# Patient Record
Sex: Female | Born: 1990 | Race: Black or African American | Hispanic: No | Marital: Single | Smoking: Never smoker
Health system: Southern US, Community
[De-identification: ages and names within clinical notes are randomized; demographics above are authoritative.]

## PROBLEM LIST (undated history)

## (undated) ENCOUNTER — Inpatient Hospital Stay (HOSPITAL_COMMUNITY): Payer: Self-pay

## (undated) DIAGNOSIS — N739 Female pelvic inflammatory disease, unspecified: Secondary | ICD-10-CM

## (undated) DIAGNOSIS — R87629 Unspecified abnormal cytological findings in specimens from vagina: Secondary | ICD-10-CM

## (undated) DIAGNOSIS — A549 Gonococcal infection, unspecified: Secondary | ICD-10-CM

## (undated) DIAGNOSIS — F419 Anxiety disorder, unspecified: Secondary | ICD-10-CM

## (undated) DIAGNOSIS — IMO0002 Reserved for concepts with insufficient information to code with codable children: Secondary | ICD-10-CM

## (undated) DIAGNOSIS — J45909 Unspecified asthma, uncomplicated: Secondary | ICD-10-CM

## (undated) DIAGNOSIS — R87619 Unspecified abnormal cytological findings in specimens from cervix uteri: Secondary | ICD-10-CM

## (undated) DIAGNOSIS — N159 Renal tubulo-interstitial disease, unspecified: Secondary | ICD-10-CM

## (undated) DIAGNOSIS — L309 Dermatitis, unspecified: Secondary | ICD-10-CM

## (undated) DIAGNOSIS — D649 Anemia, unspecified: Secondary | ICD-10-CM

## (undated) DIAGNOSIS — I10 Essential (primary) hypertension: Secondary | ICD-10-CM

## (undated) DIAGNOSIS — A749 Chlamydial infection, unspecified: Secondary | ICD-10-CM

## (undated) HISTORY — PX: DILATION AND CURETTAGE OF UTERUS: SHX78

## (undated) HISTORY — PX: WISDOM TOOTH EXTRACTION: SHX21

---
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
## 2009-07-29 ENCOUNTER — Emergency Department (HOSPITAL_COMMUNITY): Admission: EM | Admit: 2009-07-29 | Discharge: 2009-07-29 | Payer: Self-pay | Admitting: Emergency Medicine

## 2010-01-03 ENCOUNTER — Emergency Department (HOSPITAL_COMMUNITY): Admission: EM | Admit: 2010-01-03 | Discharge: 2010-01-03 | Payer: Self-pay | Admitting: Family Medicine

## 2010-02-01 ENCOUNTER — Inpatient Hospital Stay (HOSPITAL_COMMUNITY): Admission: AD | Admit: 2010-02-01 | Discharge: 2010-02-01 | Payer: Self-pay | Admitting: Obstetrics & Gynecology

## 2010-03-06 ENCOUNTER — Inpatient Hospital Stay (HOSPITAL_COMMUNITY)
Admission: AD | Admit: 2010-03-06 | Discharge: 2010-03-06 | Payer: Self-pay | Source: Home / Self Care | Attending: Family Medicine | Admitting: Family Medicine

## 2010-03-06 ENCOUNTER — Encounter: Payer: Self-pay | Admitting: Family Medicine

## 2010-03-13 ENCOUNTER — Inpatient Hospital Stay (HOSPITAL_COMMUNITY)
Admission: AD | Admit: 2010-03-13 | Discharge: 2010-03-13 | Payer: Self-pay | Source: Home / Self Care | Attending: Obstetrics & Gynecology | Admitting: Obstetrics & Gynecology

## 2010-03-19 ENCOUNTER — Ambulatory Visit: Admit: 2010-03-19 | Payer: Self-pay | Admitting: Obstetrics and Gynecology

## 2010-03-25 ENCOUNTER — Ambulatory Visit: Admit: 2010-03-25 | Payer: Self-pay | Admitting: Obstetrics & Gynecology

## 2010-03-26 ENCOUNTER — Encounter: Payer: Self-pay | Admitting: Obstetrics and Gynecology

## 2010-03-26 ENCOUNTER — Encounter: Payer: Self-pay | Admitting: Physician Assistant

## 2010-03-26 ENCOUNTER — Ambulatory Visit
Admission: RE | Admit: 2010-03-26 | Discharge: 2010-03-26 | Payer: Self-pay | Source: Home / Self Care | Attending: Obstetrics & Gynecology | Admitting: Obstetrics & Gynecology

## 2010-03-26 LAB — CONVERTED CEMR LAB: Yeast Wet Prep HPF POC: NONE SEEN

## 2010-03-31 LAB — POCT URINALYSIS DIPSTICK
Hgb urine dipstick: NEGATIVE
Specific Gravity, Urine: 1.015 (ref 1.005–1.030)
Urine Glucose, Fasting: NEGATIVE mg/dL
Urobilinogen, UA: 0.2 mg/dL (ref 0.0–1.0)

## 2010-04-02 ENCOUNTER — Ambulatory Visit: Admit: 2010-04-02 | Payer: Self-pay | Admitting: Obstetrics and Gynecology

## 2010-04-09 ENCOUNTER — Other Ambulatory Visit: Payer: Self-pay | Admitting: Obstetrics and Gynecology

## 2010-04-09 DIAGNOSIS — O0930 Supervision of pregnancy with insufficient antenatal care, unspecified trimester: Secondary | ICD-10-CM

## 2010-04-09 DIAGNOSIS — O358XX0 Maternal care for other (suspected) fetal abnormality and damage, not applicable or unspecified: Secondary | ICD-10-CM

## 2010-04-23 ENCOUNTER — Encounter: Payer: Self-pay | Admitting: Obstetrics & Gynecology

## 2010-04-23 ENCOUNTER — Other Ambulatory Visit: Payer: Self-pay | Admitting: Obstetrics and Gynecology

## 2010-04-23 DIAGNOSIS — O0930 Supervision of pregnancy with insufficient antenatal care, unspecified trimester: Secondary | ICD-10-CM

## 2010-04-23 DIAGNOSIS — F458 Other somatoform disorders: Secondary | ICD-10-CM

## 2010-04-23 DIAGNOSIS — O358XX0 Maternal care for other (suspected) fetal abnormality and damage, not applicable or unspecified: Secondary | ICD-10-CM

## 2010-05-07 ENCOUNTER — Other Ambulatory Visit: Payer: Self-pay

## 2010-05-07 ENCOUNTER — Encounter: Payer: Self-pay | Admitting: Family

## 2010-05-07 DIAGNOSIS — O358XX0 Maternal care for other (suspected) fetal abnormality and damage, not applicable or unspecified: Secondary | ICD-10-CM

## 2010-05-07 DIAGNOSIS — O0930 Supervision of pregnancy with insufficient antenatal care, unspecified trimester: Secondary | ICD-10-CM

## 2010-05-07 LAB — POCT URINALYSIS DIPSTICK
Bilirubin Urine: NEGATIVE
Hgb urine dipstick: NEGATIVE
Ketones, ur: NEGATIVE mg/dL
pH: 7 (ref 5.0–8.0)

## 2010-05-08 ENCOUNTER — Encounter: Payer: Self-pay | Admitting: Family

## 2010-05-08 LAB — CONVERTED CEMR LAB: GC Probe Amp, Genital: NEGATIVE

## 2010-05-13 ENCOUNTER — Inpatient Hospital Stay (HOSPITAL_COMMUNITY)
Admission: AD | Admit: 2010-05-13 | Discharge: 2010-05-15 | DRG: 775 | Disposition: A | Discharge status: Home / Self Care | Payer: Medicaid Other | Source: Ambulatory Visit | Attending: Obstetrics & Gynecology | Admitting: Obstetrics & Gynecology

## 2010-05-13 LAB — CBC
HCT: 33.9 % — ABNORMAL LOW (ref 36.0–46.0)
Hemoglobin: 10.9 g/dL — ABNORMAL LOW (ref 12.0–15.0)
MCH: 28.8 pg (ref 26.0–34.0)
MCHC: 32.2 g/dL (ref 30.0–36.0)

## 2010-05-19 LAB — URINALYSIS, ROUTINE W REFLEX MICROSCOPIC
Hgb urine dipstick: NEGATIVE
Specific Gravity, Urine: 1.02 (ref 1.005–1.030)
Urobilinogen, UA: 0.2 mg/dL (ref 0.0–1.0)

## 2010-05-19 LAB — RUBELLA SCREEN: Rubella: 128.7 IU/mL — ABNORMAL HIGH

## 2010-05-19 LAB — CBC
HCT: 30.4 % — ABNORMAL LOW (ref 36.0–46.0)
Hemoglobin: 9.8 g/dL — ABNORMAL LOW (ref 12.0–15.0)
MCHC: 32.2 g/dL (ref 30.0–36.0)
RBC: 3.43 MIL/uL — ABNORMAL LOW (ref 3.87–5.11)
WBC: 9.4 10*3/uL (ref 4.0–10.5)

## 2010-05-19 LAB — RPR: RPR Ser Ql: NONREACTIVE

## 2010-05-19 LAB — URINE MICROSCOPIC-ADD ON

## 2010-05-19 LAB — HIV ANTIBODY (ROUTINE TESTING W REFLEX): HIV: NONREACTIVE

## 2010-05-19 LAB — RAPID URINE DRUG SCREEN, HOSP PERFORMED
Opiates: NOT DETECTED
Tetrahydrocannabinol: POSITIVE — AB

## 2010-05-19 LAB — WET PREP, GENITAL

## 2010-05-19 LAB — TYPE AND SCREEN
ABO/RH(D): O POS
Antibody Screen: NEGATIVE

## 2010-05-19 LAB — DIFFERENTIAL
Lymphocytes Relative: 14 % (ref 12–46)
Lymphs Abs: 1.3 10*3/uL (ref 0.7–4.0)
Monocytes Absolute: 0.5 10*3/uL (ref 0.1–1.0)
Monocytes Relative: 5 % (ref 3–12)
Neutro Abs: 7.3 10*3/uL (ref 1.7–7.7)
Neutrophils Relative %: 78 % — ABNORMAL HIGH (ref 43–77)

## 2010-05-19 LAB — SICKLE CELL SCREEN: Sickle Cell Screen: NEGATIVE

## 2010-05-20 LAB — WET PREP, GENITAL: Yeast Wet Prep HPF POC: NONE SEEN

## 2010-05-20 LAB — URINALYSIS, ROUTINE W REFLEX MICROSCOPIC
Nitrite: NEGATIVE
Specific Gravity, Urine: 1.025 (ref 1.005–1.030)
Urobilinogen, UA: 0.2 mg/dL (ref 0.0–1.0)

## 2010-05-20 LAB — FETAL FIBRONECTIN: Fetal Fibronectin: NEGATIVE

## 2010-05-20 LAB — GC/CHLAMYDIA PROBE AMP, GENITAL: GC Probe Amp, Genital: POSITIVE — AB

## 2010-05-21 LAB — POCT URINALYSIS DIPSTICK
Protein, ur: NEGATIVE mg/dL
Specific Gravity, Urine: 1.015 (ref 1.005–1.030)
Urobilinogen, UA: 1 mg/dL (ref 0.0–1.0)

## 2010-05-21 LAB — GC/CHLAMYDIA PROBE AMP, GENITAL
Chlamydia, DNA Probe: NEGATIVE
GC Probe Amp, Genital: POSITIVE — AB

## 2010-05-21 LAB — WET PREP, GENITAL: Trich, Wet Prep: NONE SEEN

## 2010-05-21 LAB — POCT PREGNANCY, URINE: Preg Test, Ur: POSITIVE

## 2010-06-11 ENCOUNTER — Ambulatory Visit (INDEPENDENT_AMBULATORY_CARE_PROVIDER_SITE_OTHER): Payer: Medicaid Other | Admitting: Obstetrics and Gynecology

## 2010-06-11 ENCOUNTER — Other Ambulatory Visit: Payer: Self-pay | Admitting: Obstetrics and Gynecology

## 2010-06-11 DIAGNOSIS — Z124 Encounter for screening for malignant neoplasm of cervix: Secondary | ICD-10-CM

## 2010-06-11 DIAGNOSIS — Z01419 Encounter for gynecological examination (general) (routine) without abnormal findings: Secondary | ICD-10-CM

## 2010-06-14 LAB — POCT URINALYSIS DIP (DEVICE)
Ketones, ur: NEGATIVE mg/dL
Protein, ur: NEGATIVE mg/dL
pH: 7 (ref 5.0–8.0)

## 2010-06-14 LAB — WET PREP, GENITAL
Clue Cells Wet Prep HPF POC: NONE SEEN
Trich, Wet Prep: NONE SEEN
Yeast Wet Prep HPF POC: NONE SEEN

## 2010-06-18 LAB — CBC
HCT: 32.9 % — ABNORMAL LOW (ref 36.0–46.0)
Platelets: 188 10*3/uL (ref 150–400)
WBC: 11.4 10*3/uL — ABNORMAL HIGH (ref 4.0–10.5)

## 2010-06-18 LAB — RPR: RPR Ser Ql: NONREACTIVE

## 2010-06-19 LAB — POCT URINALYSIS DIP (DEVICE)
Bilirubin Urine: NEGATIVE
Glucose, UA: NEGATIVE mg/dL
Glucose, UA: NEGATIVE mg/dL
Ketones, ur: NEGATIVE mg/dL
Ketones, ur: NEGATIVE mg/dL
Specific Gravity, Urine: 1.015 (ref 1.005–1.030)

## 2010-06-30 ENCOUNTER — Ambulatory Visit: Payer: Medicaid Other | Admitting: Physician Assistant

## 2010-08-18 ENCOUNTER — Emergency Department (HOSPITAL_COMMUNITY)
Admission: EM | Admit: 2010-08-18 | Discharge: 2010-08-18 | Disposition: A | Discharge status: Home / Self Care | Payer: Self-pay | Attending: Emergency Medicine | Admitting: Emergency Medicine

## 2010-08-18 DIAGNOSIS — M549 Dorsalgia, unspecified: Secondary | ICD-10-CM | POA: Insufficient documentation

## 2010-08-18 DIAGNOSIS — N12 Tubulo-interstitial nephritis, not specified as acute or chronic: Secondary | ICD-10-CM | POA: Insufficient documentation

## 2010-08-18 DIAGNOSIS — R300 Dysuria: Secondary | ICD-10-CM | POA: Insufficient documentation

## 2010-08-18 DIAGNOSIS — R110 Nausea: Secondary | ICD-10-CM | POA: Insufficient documentation

## 2010-08-18 DIAGNOSIS — R509 Fever, unspecified: Secondary | ICD-10-CM | POA: Insufficient documentation

## 2010-08-18 LAB — BASIC METABOLIC PANEL
BUN: 9 mg/dL (ref 6–23)
Calcium: 9.2 mg/dL (ref 8.4–10.5)
Creatinine, Ser: 0.76 mg/dL (ref 0.4–1.2)
GFR calc Af Amer: >60 mL/min (ref >60)
GFR calc non Af Amer: >60 mL/min (ref >60)
Glucose, Bld: 90 mg/dL (ref 70–99)
Potassium: 3.6 mEq/L (ref 3.5–5.1)

## 2010-08-18 LAB — CBC
HCT: 34.4 % — ABNORMAL LOW (ref 36.0–46.0)
Hemoglobin: 11.1 g/dL — ABNORMAL LOW (ref 12.0–15.0)
MCH: 27 pg (ref 26.0–34.0)
MCHC: 32.3 g/dL (ref 30.0–36.0)
RDW: 12.6 % (ref 11.5–15.5)

## 2010-08-18 LAB — URINALYSIS, ROUTINE W REFLEX MICROSCOPIC
Ketones, ur: 40 mg/dL — AB
Nitrite: NEGATIVE
Specific Gravity, Urine: 1.017 (ref 1.005–1.030)
Urobilinogen, UA: 1 mg/dL (ref 0.0–1.0)
pH: 6 (ref 5.0–8.0)

## 2010-08-18 LAB — DIFFERENTIAL
Basophils Absolute: 0 10*3/uL (ref 0.0–0.1)
Eosinophils Relative: 0 % (ref 0–5)
Lymphocytes Relative: 12 % (ref 12–46)
Monocytes Absolute: 1.4 10*3/uL — ABNORMAL HIGH (ref 0.1–1.0)
Monocytes Relative: 9 % (ref 3–12)

## 2010-08-18 LAB — POCT PREGNANCY, URINE: Preg Test, Ur: NEGATIVE

## 2010-08-18 LAB — URINE MICROSCOPIC-ADD ON

## 2010-12-25 LAB — CBC
HCT: 31.8 — ABNORMAL LOW
Hemoglobin: 10.5 — ABNORMAL LOW
MCHC: 33
MCV: 88.9
Platelets: 265
RBC: 3.57 — ABNORMAL LOW
RDW: 12.9
WBC: 12.8 — ABNORMAL HIGH

## 2010-12-25 LAB — RPR: RPR Ser Ql: NONREACTIVE

## 2010-12-25 LAB — RAPID HIV SCREEN (WH-MAU): Rapid HIV Screen: NONREACTIVE

## 2012-03-09 DIAGNOSIS — A749 Chlamydial infection, unspecified: Secondary | ICD-10-CM

## 2012-03-09 HISTORY — DX: Chlamydial infection, unspecified: A74.9

## 2012-03-09 NOTE — L&D Delivery Note (Signed)
Delivery Note At 7:23 PM a non-viable female was delivered via Vaginal, Spontaneous Delivery while sitting on the toilet; weight 6 oz (170 g).   Placenta status: Intact, Spontaneous.  Cord:  with the following complications: None.    Anesthesia: None  Episiotomy: None Lacerations: None Est. Blood Loss (mL): 250  Mom to postpartum.  Baby to NA.  [facility] 08/29/2012, 8:46 PM

## 2012-04-12 ENCOUNTER — Other Ambulatory Visit (HOSPITAL_COMMUNITY)
Admission: RE | Admit: 2012-04-12 | Discharge: 2012-04-12 | Disposition: A | Discharge status: Home / Self Care | Payer: Self-pay | Source: Ambulatory Visit | Attending: Family Medicine | Admitting: Family Medicine

## 2012-04-12 ENCOUNTER — Emergency Department (INDEPENDENT_AMBULATORY_CARE_PROVIDER_SITE_OTHER)
Admission: EM | Admit: 2012-04-12 | Discharge: 2012-04-12 | Disposition: A | Discharge status: Home / Self Care | Payer: Medicaid Other | Source: Home / Self Care | Attending: Family Medicine | Admitting: Family Medicine

## 2012-04-12 ENCOUNTER — Encounter (HOSPITAL_COMMUNITY): Payer: Self-pay | Admitting: Emergency Medicine

## 2012-04-12 DIAGNOSIS — N760 Acute vaginitis: Secondary | ICD-10-CM | POA: Insufficient documentation

## 2012-04-12 DIAGNOSIS — Z113 Encounter for screening for infections with a predominantly sexual mode of transmission: Secondary | ICD-10-CM | POA: Insufficient documentation

## 2012-04-12 LAB — POCT URINALYSIS DIP (DEVICE)
Glucose, UA: NEGATIVE mg/dL
Leukocytes, UA: NEGATIVE
Nitrite: NEGATIVE
Protein, ur: NEGATIVE mg/dL
Specific Gravity, Urine: 1.025 (ref 1.005–1.030)
Urobilinogen, UA: 1 mg/dL (ref 0.0–1.0)

## 2012-04-12 MED ORDER — CEFTRIAXONE SODIUM 1 G IJ SOLR
250.0000 mg | Freq: Once | INTRAMUSCULAR | Status: AC
  Administered 2012-04-12: 250 mg via INTRAMUSCULAR

## 2012-04-12 MED ORDER — LIDOCAINE HCL (PF) 1 % IJ SOLN
INTRAMUSCULAR | Status: AC
  Filled 2012-04-12: qty 5

## 2012-04-12 MED ORDER — CEFTRIAXONE SODIUM 1 G IJ SOLR
INTRAMUSCULAR | Status: AC
  Filled 2012-04-12: qty 10

## 2012-04-12 MED ORDER — AZITHROMYCIN 250 MG PO TABS
ORAL_TABLET | ORAL | Status: AC
  Filled 2012-04-12: qty 4

## 2012-04-12 MED ORDER — AZITHROMYCIN 250 MG PO TABS
1000.0000 mg | ORAL_TABLET | Freq: Once | ORAL | Status: AC
  Administered 2012-04-12: 1000 mg via ORAL

## 2012-04-12 NOTE — ED Notes (Signed)
Pt given meds then will discharge.  
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
## 2012-04-13 LAB — POCT PREGNANCY, URINE: Preg Test, Ur: NEGATIVE

## 2012-04-15 ENCOUNTER — Telehealth (HOSPITAL_COMMUNITY): Payer: Self-pay | Admitting: *Deleted

## 2012-04-15 MED ORDER — METRONIDAZOLE 250 MG PO TABS: 250.0000 mg | ORAL_TABLET | Freq: Three times a day (TID) | ORAL | Status: DC

## 2012-04-15 NOTE — ED Notes (Signed)
Order for Flagyl obtained from Dr. Artis Flock.  I called pt. and she was unavailable. Unable to leave a message. Vassie Moselle 04/15/2012

## 2012-04-15 NOTE — ED Notes (Signed)
GC neg., Chlamydia pos.,  Affirm: Candida and Trich neg., Gardnerella pos.  Pt. was treated with Zithromax and Rocephin.  Message to Dr. Artis Flock for further orders.

## 2012-04-18 ENCOUNTER — Telehealth (HOSPITAL_COMMUNITY): Payer: Self-pay | Admitting: *Deleted

## 2012-04-18 NOTE — ED Notes (Signed)
Left message with contact person for pt. to call.  Call 2.
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
## 2012-05-28 ENCOUNTER — Inpatient Hospital Stay (HOSPITAL_COMMUNITY)
Admission: AD | Admit: 2012-05-28 | Discharge: 2012-05-28 | Disposition: A | Discharge status: Home / Self Care | Payer: Medicaid Other | Source: Ambulatory Visit | Attending: Obstetrics & Gynecology | Admitting: Obstetrics & Gynecology

## 2012-05-28 ENCOUNTER — Encounter (HOSPITAL_COMMUNITY): Payer: Self-pay | Admitting: *Deleted

## 2012-05-28 DIAGNOSIS — O99019 Anemia complicating pregnancy, unspecified trimester: Secondary | ICD-10-CM | POA: Insufficient documentation

## 2012-05-28 DIAGNOSIS — O219 Vomiting of pregnancy, unspecified: Secondary | ICD-10-CM

## 2012-05-28 DIAGNOSIS — N949 Unspecified condition associated with female genital organs and menstrual cycle: Secondary | ICD-10-CM | POA: Insufficient documentation

## 2012-05-28 DIAGNOSIS — O210 Mild hyperemesis gravidarum: Secondary | ICD-10-CM | POA: Insufficient documentation

## 2012-05-28 DIAGNOSIS — D649 Anemia, unspecified: Secondary | ICD-10-CM | POA: Insufficient documentation

## 2012-05-28 HISTORY — DX: Chlamydial infection, unspecified: A74.9

## 2012-05-28 LAB — WET PREP, GENITAL: Clue Cells Wet Prep HPF POC: NONE SEEN

## 2012-05-28 LAB — URINALYSIS, ROUTINE W REFLEX MICROSCOPIC
Bilirubin Urine: NEGATIVE
Ketones, ur: 15 mg/dL — AB
Leukocytes, UA: NEGATIVE
Nitrite: NEGATIVE
Protein, ur: NEGATIVE mg/dL
Urobilinogen, UA: 0.2 mg/dL (ref 0.0–1.0)
pH: 7 (ref 5.0–8.0)

## 2012-05-28 MED ORDER — ONDANSETRON 8 MG PO TBDP
8.0000 mg | ORAL_TABLET | Freq: Once | ORAL | Status: AC
  Administered 2012-05-28: 8 mg via ORAL
  Filled 2012-05-28: qty 1

## 2012-05-28 MED ORDER — PROMETHAZINE HCL 12.5 MG PO TABS: 12.5000 mg | ORAL_TABLET | Freq: Four times a day (QID) | ORAL | Status: DC | PRN

## 2012-05-28 NOTE — MAU Note (Signed)
Pt has positive HPT. Feeling nauseated for the past few days ans has poor appetite . No vomiting

## 2012-05-28 NOTE — MAU Provider Note (Signed)
History     CSN: [id number]  Arrival date and time: 05/28/12 1104   First Provider Initiated Contact with Patient 05/28/12 1126      No chief complaint on file.  HPI Ana [name] is 22 y.o. [id number] [redacted]w[redacted]d presenting with nausea.  +HPT 2 weeks ago.  Denies vaginal bleeding.  Is unable to eat.  Has white vaginal discharge.  Would like to go to low risk clinic for prenatal care--has been patient there in past.  States she is cold all the time and wonders if she is anemic.  Then she stated she gets hot too.    Past Medical History  Diagnosis Date  . Chlamydia     Past Surgical History  Procedure Laterality Date  . No past surgeries      No family history on file.  History  Substance Use Topics  . Smoking status: Former Smoker    Quit date: 04/09/2012  . Smokeless tobacco: Not on file  . Alcohol Use: Yes     Comment: occasional/not since pregnancy    Allergies: No Known Allergies  Prescriptions prior to admission  Medication Sig Dispense Refill  . acetaminophen (TYLENOL) 500 MG tablet Take 500 mg by mouth as needed for pain.      . Prenatal Vit-Fe Fumarate-FA (PRENATAL MULTIVITAMIN) TABS Take 1 tablet by mouth daily with breakfast.        Review of Systems  Gastrointestinal: Positive for nausea. Negative for vomiting and abdominal pain.  Genitourinary:       Neg for vaginal bleeding + for white discharge   Physical Exam   Blood pressure 130/56, pulse 66, temperature 99 F (37.2 C), temperature source Oral, resp. rate 18, height 5\' 4"  (1.626 m), weight 149 lb 9.6 oz (67.858 kg), last menstrual period 04/12/2012.  Physical Exam  Constitutional: She appears well-developed and well-nourished. No distress.  HENT:  Head: Normocephalic.  Neck: Normal range of motion.  Cardiovascular: Normal rate.   Respiratory: Effort normal.  GI: Soft. She exhibits no distension and no mass. There is no tenderness. There is no rebound and no guarding.  Genitourinary: There is no  rash, tenderness or lesion on the right labia. There is no rash, tenderness or lesion on the left labia. Uterus is enlarged (slightly). Uterus is not tender. Cervix exhibits no discharge and no friability. Right adnexum displays no mass, no tenderness and no fullness. Left adnexum displays no mass, no tenderness and no fullness. No erythema, tenderness or bleeding around the vagina. Vaginal discharge (small amount of white discharge without odor) found.   Results for orders placed during the hospital encounter of 05/28/12 (from the past 24 hour(s))  URINALYSIS, ROUTINE W REFLEX MICROSCOPIC     Status: Abnormal   Collection Time    05/28/12 11:11 AM      Result Value Range   Color, Urine YELLOW  YELLOW   APPearance CLEAR  CLEAR   Specific Gravity, Urine 1.020  1.005 - 1.030   pH 7.0  5.0 - 8.0   Glucose, UA NEGATIVE  NEGATIVE mg/dL   Hgb urine dipstick NEGATIVE  NEGATIVE   Bilirubin Urine NEGATIVE  NEGATIVE   Ketones, ur 15 (*) NEGATIVE mg/dL   Protein, ur NEGATIVE  NEGATIVE mg/dL   Urobilinogen, UA 0.2  0.0 - 1.0 mg/dL   Nitrite NEGATIVE  NEGATIVE   Leukocytes, UA NEGATIVE  NEGATIVE  POCT PREGNANCY, URINE     Status: Abnormal   Collection Time    05/28/12 11:20 AM  Result Value Range   Preg Test, Ur POSITIVE (*) NEGATIVE  HEMOGLOBIN AND HEMATOCRIT, BLOOD     Status: Abnormal   Collection Time    05/28/12 11:38 AM      Result Value Range   Hemoglobin 10.9 (*) 12.0 - 15.0 g/dL   HCT 40.9 (*) 81.1 - 91.4 %  WET PREP, GENITAL     Status: Abnormal   Collection Time    05/28/12 12:00 PM      Result Value Range   Yeast Wet Prep HPF POC NONE SEEN  NONE SEEN   Trich, Wet Prep NONE SEEN  NONE SEEN   Clue Cells Wet Prep HPF POC NONE SEEN  NONE SEEN   WBC, Wet Prep HPF POC FEW (*) NONE SEEN   MAU Course  Procedures  GC/CHL culture to lab  MDM Zofran 8mg  ODT for nausea given in MAU  Assessment and Plan  A:  Nausea in pregnancy  [redacted]w[redacted]d by LMP     Anemia       P:  Refer to low  risk clinic for prenatal care      Encouraged patient to begin prenatal vitamins      Rx for Phenergan 12.5mg  tabs to pharmacy   Ana [name],EVE M 05/28/2012, 12:41 PM

## 2012-05-29 LAB — GC/CHLAMYDIA PROBE AMP: CT Probe RNA: NEGATIVE

## 2012-07-16 ENCOUNTER — Inpatient Hospital Stay (HOSPITAL_COMMUNITY)
Admission: AD | Admit: 2012-07-16 | Discharge: 2012-07-17 | Disposition: A | Discharge status: Home / Self Care | Payer: Medicaid Other | Source: Ambulatory Visit | Attending: Obstetrics & Gynecology | Admitting: Obstetrics & Gynecology

## 2012-07-16 DIAGNOSIS — R109 Unspecified abdominal pain: Secondary | ICD-10-CM | POA: Insufficient documentation

## 2012-07-16 DIAGNOSIS — N949 Unspecified condition associated with female genital organs and menstrual cycle: Secondary | ICD-10-CM | POA: Insufficient documentation

## 2012-07-16 DIAGNOSIS — O209 Hemorrhage in early pregnancy, unspecified: Secondary | ICD-10-CM | POA: Insufficient documentation

## 2012-07-16 DIAGNOSIS — D649 Anemia, unspecified: Secondary | ICD-10-CM | POA: Insufficient documentation

## 2012-07-16 DIAGNOSIS — O99019 Anemia complicating pregnancy, unspecified trimester: Secondary | ICD-10-CM | POA: Insufficient documentation

## 2012-07-17 ENCOUNTER — Encounter (HOSPITAL_COMMUNITY): Payer: Self-pay | Admitting: Family

## 2012-07-17 ENCOUNTER — Inpatient Hospital Stay (HOSPITAL_COMMUNITY): Payer: Medicaid Other

## 2012-07-17 DIAGNOSIS — D649 Anemia, unspecified: Secondary | ICD-10-CM

## 2012-07-17 LAB — CBC
MCV: 86.2 fL (ref 78.0–100.0)
Platelets: 190 10*3/uL (ref 150–400)
RBC: 3.63 MIL/uL — ABNORMAL LOW (ref 3.87–5.11)
RDW: 12.7 % (ref 11.5–15.5)
WBC: 9.6 10*3/uL (ref 4.0–10.5)

## 2012-07-17 MED ORDER — INTEGRA F 125-1 MG PO CAPS: 1.0000 | ORAL_CAPSULE | Freq: Every day | ORAL | Status: DC

## 2012-07-17 NOTE — MAU Note (Signed)
Pt G5 P4 at 13.5wks had an episode of dark red bleeding tonight and cramping.  No bleeding at this time.

## 2012-07-17 NOTE — MAU Provider Note (Signed)
  History     CSN: [id number]  Arrival date and time: 07/16/12 2328   First Provider Initiated Contact with Patient 07/17/12 0115      Chief Complaint  Patient presents with  . Abdominal Cramping  . Vaginal Bleeding   HPI  Pt is a G5P4004 at 13.5 wks here with report of vaginal bleeding and cramping.  No report of UTI symptoms or abnormal vaginal discharge.  +appt in Low Risk clinic on Wednesday for new ob.    Past Medical History  Diagnosis Date  . Chlamydia     Past Surgical History  Procedure Laterality Date  . No past surgeries      No family history on file.  History  Substance Use Topics  . Smoking status: Former Smoker    Quit date: 04/09/2012  . Smokeless tobacco: Not on file  . Alcohol Use: Yes     Comment: occasional/not since pregnancy    Allergies: No Known Allergies  Prescriptions prior to admission  Medication Sig Dispense Refill  . acetaminophen (TYLENOL) 500 MG tablet Take 500 mg by mouth as needed for pain.      . Prenatal Vit-Fe Fumarate-FA (PRENATAL MULTIVITAMIN) TABS Take 1 tablet by mouth daily with breakfast.      . promethazine (PHENERGAN) 12.5 MG tablet Take 1 tablet (12.5 mg total) by mouth every 6 (six) hours as needed for nausea.  30 tablet  0    Review of Systems  Gastrointestinal: Positive for nausea and abdominal pain (cramping).  Genitourinary:       Vaginal bleeding  All other systems reviewed and are negative.   Physical Exam   Blood pressure 109/66, pulse 74, temperature 98.2 F (36.8 C), temperature source Oral, resp. rate 16, height 5\' 4"  (1.626 m), weight 74.118 kg (163 lb 6.4 oz), last menstrual period 04/12/2012.  Physical Exam  Constitutional: She is oriented to person, place, and time. She appears well-developed and well-nourished.  HENT:  Head: Normocephalic.  Neck: Normal range of motion. Neck supple.  Cardiovascular: Normal rate, regular rhythm and normal heart sounds.   Respiratory: Effort normal and breath  sounds normal. No respiratory distress.  GI: Soft. She exhibits no mass. There is no tenderness. There is no rebound and no guarding.  Genitourinary: There is bleeding (scant) around the vagina.  Neurological: She is alert and oriented to person, place, and time.  Skin: Skin is warm and dry.    MAU Course  Procedures Ultrasound: IMPRESSION:  Single live intrauterine gestation measured at 13 weeks 3 days EGA.  Moderate sized subchorionic hemorrhage.   Assessment and Plan  Vaginal Bleeding in Pregnancy Subchorionic Hemorrhage Anemia  Plan: DC to home Bleeding Precautions RX Integra Keep scheduled appointment  [facility] 07/17/2012, 1:22 AM

## 2012-07-19 ENCOUNTER — Other Ambulatory Visit: Payer: Self-pay

## 2012-07-19 DIAGNOSIS — Z3201 Encounter for pregnancy test, result positive: Secondary | ICD-10-CM

## 2012-07-19 LAB — HIV ANTIBODY (ROUTINE TESTING W REFLEX): HIV: NONREACTIVE

## 2012-07-20 LAB — OBSTETRIC PANEL
Antibody Screen: NEGATIVE
Eosinophils Relative: 3 % (ref 0–5)
HCT: 33.9 % — ABNORMAL LOW (ref 36.0–46.0)
Hemoglobin: 11.1 g/dL — ABNORMAL LOW (ref 12.0–15.0)
Lymphocytes Relative: 23 % (ref 12–46)
MCHC: 32.7 g/dL (ref 30.0–36.0)
MCV: 87.4 fL (ref 78.0–100.0)
Monocytes Absolute: 0.4 10*3/uL (ref 0.1–1.0)
Monocytes Relative: 5 % (ref 3–12)
Neutro Abs: 5.6 10*3/uL (ref 1.7–7.7)
RDW: 13.7 % (ref 11.5–15.5)
Rh Type: POSITIVE
Rubella: 4.78 Index — ABNORMAL HIGH (ref <0.90)

## 2012-07-25 ENCOUNTER — Ambulatory Visit (INDEPENDENT_AMBULATORY_CARE_PROVIDER_SITE_OTHER): Payer: Self-pay | Admitting: Family Medicine

## 2012-07-25 ENCOUNTER — Encounter: Payer: Self-pay | Admitting: Family Medicine

## 2012-07-25 VITALS — BP 121/68 | Temp 99.0°F | Wt 154.0 lb

## 2012-07-25 DIAGNOSIS — Z3482 Encounter for supervision of other normal pregnancy, second trimester: Secondary | ICD-10-CM

## 2012-07-25 DIAGNOSIS — O4290 Premature rupture of membranes, unspecified as to length of time between rupture and onset of labor, unspecified weeks of gestation: Secondary | ICD-10-CM

## 2012-07-25 DIAGNOSIS — Z3480 Encounter for supervision of other normal pregnancy, unspecified trimester: Secondary | ICD-10-CM | POA: Insufficient documentation

## 2012-07-25 LAB — POCT URINALYSIS DIP (DEVICE)
Bilirubin Urine: NEGATIVE
Glucose, UA: NEGATIVE mg/dL
Hgb urine dipstick: NEGATIVE
Ketones, ur: NEGATIVE mg/dL
Specific Gravity, Urine: >=1.03 (ref 1.005–1.030)
pH: 6.5 (ref 5.0–8.0)

## 2012-07-25 NOTE — Progress Notes (Signed)
U/S scheduled 08/25/13 at 8 am.

## 2012-07-25 NOTE — Progress Notes (Signed)
Nutrition note: 1st visit consult Pt has gained 19# @ [redacted]w[redacted]d, which is > expected. Pt reports eating 4x/d. 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 Pt is taking PNV. Pt reports some nausea and heartburn but no vomiting. Pt received verbal & written education on general nutrition during pregnancy. Discussed tips to decrease nausea and heartburn. Discussed risk of listeria with deli meat - encouraged pt to heat the meat in a microwave until steaming to kill the bacteria. Discussed wt gain goals of 25-35# or 1#/wk. Pt agrees to continue taking PNV. Pt does not have WIC but plans to apply. Pt plans to BF. F/u if referred Blondell Reveal, MS, RD, LDN

## 2012-07-25 NOTE — Progress Notes (Signed)
   Subjective:    Ana [name] is a [id number] [redacted]w[redacted]d being seen today for her first obstetrical visit.  Her obstetrical history is significant for marijuana use. Patient does intend to breast feed. Pregnancy history fully reviewed.  Patient reports fatigue and no contractions.  Filed Vitals:   07/25/12 0835  BP: 121/68  Temp: 99 F (37.2 C)  Weight: 154 lb (69.854 kg)    HISTORY: OB History   Grav Para Term Preterm Abortions TAB SAB Ect Mult Living   5 4 4       4      # Outc Date GA Lbr Len/2nd Wgt Sex Del Anes PTL Lv   1 TRM 1/07   7lb(3.175kg) F SVD      2 TRM 6/08   6lb14oz(3.118kg) M SVD      3 TRM 4/10   6lb(2.722kg) F SVD      4 TRM 3/12   6lb(2.722kg) F SVD      5 CUR              Past Medical History  Diagnosis Date  . Chlamydia    Past Surgical History  Procedure Laterality Date  . No past surgeries     Family History  Problem Relation Age of Onset  . Hypertension Mother   . Hypertension Maternal Grandmother      Exam    Uterus:     Pelvic Exam:    Perineum: Normal Perineum   Vulva: Bartholin's, Urethra, Skene's normal   Vagina:  normal mucosa   Cervix: multiparous appearance, no cervical motion tenderness, no lesions and closed   Adnexa: normal adnexa   Bony Pelvis: average  System: Breast:  normal appearance, no masses or tenderness   Skin: normal coloration and turgor, no rashes    Neurologic: normal   Extremities: normal strength, tone, and muscle mass   HEENT sclera clear, anicteric   Mouth/Teeth mucous membranes moist, pharynx normal without lesions   Neck supple   Cardiovascular: regular rate and rhythm, no murmurs or gallops   Respiratory:  appears well, vitals normal, no respiratory distress, acyanotic, normal RR, ear and throat exam is normal, neck free of mass or lymphadenopathy, chest clear, no wheezing, crepitations, rhonchi, normal symmetric air entry   Abdomen: soft, non-tender; bowel sounds normal; no masses,  no organomegaly   Uterus  14 wk size, non-tender      Assessment:    Pregnancy: [id number] Patient Active Problem List   Diagnosis Date Noted  . Supervision of other normal pregnancy 07/25/2012        Plan:     Initial labs drawn. Prenatal vitamins. Problem list reviewed and updated. Genetic Screening discussed Quad Screen: at next visit.  Ultrasound discussed; fetal survey: ordered.  Follow up in 4 weeks. To see SW about medicaid.     Saxton Chain S 07/25/2012

## 2012-07-25 NOTE — Patient Instructions (Signed)
Pregnancy - Second Trimester The second trimester of pregnancy (3 to 6 months) is a period of rapid growth for you and your baby. At the end of the sixth month, your baby is about 9 inches long and weighs 1 1/2 pounds. You will begin to feel the baby move between 18 and 20 weeks of the pregnancy. This is called quickening. Weight gain is faster. A clear fluid (colostrum) may leak out of your breasts. You may feel small contractions of the womb (uterus). This is known as false labor or Braxton-Hicks contractions. This is like a practice for labor when the baby is ready to be born. Usually, the problems with morning sickness have usually passed by the end of your first trimester. Some women develop small dark blotches (called cholasma, mask of pregnancy) on their face that usually goes away after the baby is born. Exposure to the sun makes the blotches worse. Acne may also develop in some pregnant women and pregnant women who have acne, may find that it goes away. PRENATAL EXAMS  Blood work may continue to be done during prenatal exams. These tests are done to check on your health and the probable health of your baby. Blood work is used to follow your blood levels (hemoglobin). Anemia (low hemoglobin) is common during pregnancy. Iron and vitamins are given to help prevent this. You will also be checked for diabetes between 24 and 28 weeks of the pregnancy. Some of the previous blood tests may be repeated.  The size of the uterus is measured during each visit. This is to make sure that the baby is continuing to grow properly according to the dates of the pregnancy.  Your blood pressure is checked every prenatal visit. This is to make sure you are not getting toxemia.  Your urine is checked to make sure you do not have an infection, diabetes or protein in the urine.  Your weight is checked often to make sure gains are happening at the suggested rate. This is to ensure that both you and your baby are  growing normally.  Sometimes, an ultrasound is performed to confirm the proper growth and development of the baby. This is a test which bounces harmless sound waves off the baby so your caregiver can more accurately determine due dates. Sometimes, a specialized test is done on the amniotic fluid surrounding the baby. This test is called an amniocentesis. The amniotic fluid is obtained by sticking a needle into the belly (abdomen). This is done to check the chromosomes in instances where there is a concern about possible genetic problems with the baby. It is also sometimes done near the end of pregnancy if an early delivery is required. In this case, it is done to help make sure the baby's lungs are mature enough for the baby to live outside of the womb. CHANGES OCCURING IN THE SECOND TRIMESTER OF PREGNANCY Your body goes through many changes during pregnancy. They vary from person to person. Talk to your caregiver about changes you notice that you are concerned about.  During the second trimester, you will likely have an increase in your appetite. It is normal to have cravings for certain foods. This varies from person to person and pregnancy to pregnancy.  Your lower abdomen will begin to bulge.  You may have to urinate more often because the uterus and baby are pressing on your bladder. It is also common to get more bladder infections during pregnancy (pain with urination). You can help this by   drinking lots of fluids and emptying your bladder before and after intercourse.  You may begin to get stretch marks on your hips, abdomen, and breasts. These are normal changes in the body during pregnancy. There are no exercises or medications to take that prevent this change.  You may begin to develop swollen and bulging veins (varicose veins) in your legs. Wearing support hose, elevating your feet for 15 minutes, 3 to 4 times a day and limiting salt in your diet helps lessen the problem.  Heartburn may  develop as the uterus grows and pushes up against the stomach. Antacids recommended by your caregiver helps with this problem. Also, eating smaller meals 4 to 5 times a day helps.  Constipation can be treated with a stool softener or adding bulk to your diet. Drinking lots of fluids, vegetables, fruits, and whole grains are helpful.  Exercising is also helpful. If you have been very active up until your pregnancy, most of these activities can be continued during your pregnancy. If you have been less active, it is helpful to start an exercise program such as walking.  Hemorrhoids (varicose veins in the rectum) may develop at the end of the second trimester. Warm sitz baths and hemorrhoid cream recommended by your caregiver helps hemorrhoid problems.  Backaches may develop during this time of your pregnancy. Avoid heavy lifting, wear low heal shoes and practice good posture to help with backache problems.  Some pregnant women develop tingling and numbness of their hand and fingers because of swelling and tightening of ligaments in the wrist (carpel tunnel syndrome). This goes away after the baby is born.  As your breasts enlarge, you may have to get a bigger bra. Get a comfortable, cotton, support bra. Do not get a nursing bra until the last month of the pregnancy if you will be nursing the baby.  You may get a dark line from your belly button to the pubic area called the linea nigra.  You may develop rosy cheeks because of increase blood flow to the face.  You may develop spider looking lines of the face, neck, arms and chest. These go away after the baby is born. HOME CARE INSTRUCTIONS   It is extremely important to avoid all smoking, herbs, alcohol, and unprescribed drugs during your pregnancy. These chemicals affect the formation and growth of the baby. Avoid these chemicals throughout the pregnancy to ensure the delivery of a healthy infant.  Most of your home care instructions are the same  as suggested for the first trimester of your pregnancy. Keep your caregiver's appointments. Follow your caregiver's instructions regarding medication use, exercise and diet.  During pregnancy, you are providing food for you and your baby. Continue to eat regular, well-balanced meals. Choose foods such as meat, fish, milk and other low fat dairy products, vegetables, fruits, and whole-grain breads and cereals. Your caregiver will tell you of the ideal weight gain.  A physical sexual relationship may be continued up until near the end of pregnancy if there are no other problems. Problems could include early (premature) leaking of amniotic fluid from the membranes, vaginal bleeding, abdominal pain, or other medical or pregnancy problems.  Exercise regularly if there are no restrictions. Check with your caregiver if you are unsure of the safety of some of your exercises. The greatest weight gain will occur in the last 2 trimesters of pregnancy. Exercise will help you:  Control your weight.  Get you in shape for labor and delivery.  Lose weight   after you have the baby.  Wear a good support or jogging bra for breast tenderness during pregnancy. This may help if worn during sleep. Pads or tissues may be used in the bra if you are leaking colostrum.  Do not use hot tubs, steam rooms or saunas throughout the pregnancy.  Wear your seat belt at all times when driving. This protects you and your baby if you are in an accident.  Avoid raw meat, uncooked cheese, cat litter boxes and soil used by cats. These carry germs that can cause birth defects in the baby.  The second trimester is also a good time to visit your dentist for your dental health if this has not been done yet. Getting your teeth cleaned is OK. Use a soft toothbrush. Brush gently during pregnancy.  It is easier to loose urine during pregnancy. Tightening up and strengthening the pelvic muscles will help with this problem. Practice stopping  your urination while you are going to the bathroom. These are the same muscles you need to strengthen. It is also the muscles you would use as if you were trying to stop from passing gas. You can practice tightening these muscles up 10 times a set and repeating this about 3 times per day. Once you know what muscles to tighten up, do not perform these exercises during urination. It is more likely to contribute to an infection by backing up the urine.  Ask for help if you have financial, counseling or nutritional needs during pregnancy. Your caregiver will be able to offer counseling for these needs as well as refer you for other special needs.  Your skin may become oily. If so, wash your face with mild soap, use non-greasy moisturizer and oil or cream based makeup. MEDICATIONS AND DRUG USE IN PREGNANCY  Take prenatal vitamins as directed. The vitamin should contain 1 milligram of folic acid. Keep all vitamins out of reach of children. Only a couple vitamins or tablets containing iron may be fatal to a baby or young child when ingested.  Avoid use of all medications, including herbs, over-the-counter medications, not prescribed or suggested by your caregiver. Only take over-the-counter or prescription medicines for pain, discomfort, or fever as directed by your caregiver. Do not use aspirin.  Let your caregiver also know about herbs you may be using.  Alcohol is related to a number of birth defects. This includes fetal alcohol syndrome. All alcohol, in any form, should be avoided completely. Smoking will cause low birth rate and premature babies.  Street or illegal drugs are very harmful to the baby. They are absolutely forbidden. A baby born to an addicted mother will be addicted at birth. The baby will go through the same withdrawal an adult does. SEEK MEDICAL CARE IF:  You have any concerns or worries during your pregnancy. It is better to call with your questions if you feel they cannot wait,  rather than worry about them. SEEK IMMEDIATE MEDICAL CARE IF:   An unexplained oral temperature above 102 F (38.9 C) develops, or as your caregiver suggests.  You have leaking of fluid from the vagina (birth canal). If leaking membranes are suspected, take your temperature and tell your caregiver of this when you call.  There is vaginal spotting, bleeding, or passing clots. Tell your caregiver of the amount and how many pads are used. Light spotting in pregnancy is common, especially following intercourse.  You develop a bad smelling vaginal discharge with a change in the color from clear   to white.  You continue to feel sick to your stomach (nauseated) and have no relief from remedies suggested. You vomit blood or coffee ground-like materials.  You lose more than 2 pounds of weight or gain more than 2 pounds of weight over 1 week, or as suggested by your caregiver.  You notice swelling of your face, hands, feet, or legs.  You get exposed to German measles and have never had them.  You are exposed to fifth disease or chickenpox.  You develop belly (abdominal) pain. Round ligament discomfort is a common non-cancerous (benign) cause of abdominal pain in pregnancy. Your caregiver still must evaluate you.  You develop a bad headache that does not go away.  You develop fever, diarrhea, pain with urination, or shortness of breath.  You develop visual problems, blurry, or double vision.  You fall or are in a car accident or any kind of trauma.  There is mental or physical violence at home. Document Released: 02/17/2001 Document Revised: 05/18/2011 Document Reviewed: 08/22/2008 [employer] Patient Information 2013 [employer].  Contraception Choices Contraception (birth control) is the use of any methods or devices to prevent pregnancy. Below are some methods to help avoid pregnancy. HORMONAL METHODS   Contraceptive implant. This is a thin, plastic tube containing progesterone  hormone. It does not contain estrogen hormone. Your caregiver inserts the tube in the inner part of the upper arm. The tube can remain in place for up to 3 years. After 3 years, the implant must be removed. The implant prevents the ovaries from releasing an egg (ovulation), thickens the cervical mucus which prevents sperm from entering the uterus, and thins the lining of the inside of the uterus.  Progesterone-only injections. These injections are given every 3 months by your caregiver to prevent pregnancy. This synthetic progesterone hormone stops the ovaries from releasing eggs. It also thickens cervical mucus and changes the uterine lining. This makes it harder for sperm to survive in the uterus.  Birth control pills. These pills contain estrogen and progesterone hormone. They work by stopping the egg from forming in the ovary (ovulation). Birth control pills are prescribed by a caregiver.Birth control pills can also be used to treat heavy periods.  Minipill. This type of birth control pill contains only the progesterone hormone. They are taken every day of each month and must be prescribed by your caregiver.  Birth control patch. The patch contains hormones similar to those in birth control pills. It must be changed once a week and is prescribed by a caregiver.  Vaginal ring. The ring contains hormones similar to those in birth control pills. It is left in the vagina for 3 weeks, removed for 1 week, and then a new one is put back in place. The patient must be comfortable inserting and removing the ring from the vagina.A caregiver's prescription is necessary.  Emergency contraception. Emergency contraceptives prevent pregnancy after unprotected sexual intercourse. This pill can be taken right after sex or up to 5 days after unprotected sex. It is most effective the sooner you take the pills after having sexual intercourse. Emergency contraceptive pills are available without a prescription. Check  with your pharmacist. Do not use emergency contraception as your only form of birth control. BARRIER METHODS   Female condom. This is a thin sheath (latex or rubber) that is worn over the penis during sexual intercourse. It can be used with spermicide to increase effectiveness.  Female condom. This is a soft, loose-fitting sheath that is put into the   vagina before sexual intercourse.  Diaphragm. This is a soft, latex, dome-shaped barrier that must be fitted by a caregiver. It is inserted into the vagina, along with a spermicidal jelly. It is inserted before intercourse. The diaphragm should be left in the vagina for 6 to 8 hours after intercourse.  Cervical cap. This is a round, soft, latex or plastic cup that fits over the cervix and must be fitted by a caregiver. The cap can be left in place for up to 48 hours after intercourse.  Sponge. This is a soft, circular piece of polyurethane foam. The sponge has spermicide in it. It is inserted into the vagina after wetting it and before sexual intercourse.  Spermicides. These are chemicals that kill or block sperm from entering the cervix and uterus. They come in the form of creams, jellies, suppositories, foam, or tablets. They do not require a prescription. They are inserted into the vagina with an applicator before having sexual intercourse. The process must be repeated every time you have sexual intercourse. INTRAUTERINE CONTRACEPTION  Intrauterine device (IUD). This is a T-shaped device that is put in a woman's uterus during a menstrual period to prevent pregnancy. There are 2 types:  Copper IUD. This type of IUD is wrapped in copper wire and is placed inside the uterus. Copper makes the uterus and fallopian tubes produce a fluid that kills sperm. It can stay in place for 10 years.  Hormone IUD. This type of IUD contains the hormone progestin (synthetic progesterone). The hormone thickens the cervical mucus and prevents sperm from entering the  uterus, and it also thins the uterine lining to prevent implantation of a fertilized egg. The hormone can weaken or kill the sperm that get into the uterus. It can stay in place for 5 years. PERMANENT METHODS OF CONTRACEPTION  Female tubal ligation. This is when the woman's fallopian tubes are surgically sealed, tied, or blocked to prevent the egg from traveling to the uterus.  Female sterilization. This is when the female has the tubes that carry sperm tied off (vasectomy).This blocks sperm from entering the vagina during sexual intercourse. After the procedure, the man can still ejaculate fluid (semen). NATURAL PLANNING METHODS  Natural family planning. This is not having sexual intercourse or using a barrier method (condom, diaphragm, cervical cap) on days the woman could become pregnant.  Calendar method. This is keeping track of the length of each menstrual cycle and identifying when you are fertile.  Ovulation method. This is avoiding sexual intercourse during ovulation.  Symptothermal method. This is avoiding sexual intercourse during ovulation, using a thermometer and ovulation symptoms.  Post-ovulation method. This is timing sexual intercourse after you have ovulated. Regardless of which type or method of contraception you choose, it is important that you use condoms to protect against the transmission of sexually transmitted diseases (STDs). Talk with your caregiver about which form of contraception is most appropriate for you. Document Released: 02/23/2005 Document Revised: 05/18/2011 Document Reviewed: 07/02/2010 [employer] Patient Information 2013 [employer].  Breastfeeding Deciding to breastfeed is one of the best choices you can make for you and your baby. The information that follows gives a brief overview of the benefits of breastfeeding as well as common topics surrounding breastfeeding. BENEFITS OF BREASTFEEDING For the baby  The first milk (colostrum) helps the baby's  digestive system function better.   There are antibodies in the mother's milk that help the baby fight off infections.   The baby has a lower incidence of asthma,   allergies, and sudden infant death syndrome (SIDS).   The nutrients in breast milk are better for the baby than infant formulas, and breast milk helps the baby's brain grow better.   Babies who breastfeed have less gas, colic, and constipation.  For the mother  Breastfeeding helps develop a very special bond between the mother and her baby.   Breastfeeding is convenient, always available at the correct temperature, and costs nothing.   Breastfeeding burns calories in the mother and helps her lose weight that was gained during pregnancy.   Breastfeeding makes the uterus contract back down to normal size faster and slows bleeding following delivery.   Breastfeeding mothers have a lower risk of developing breast cancer.  BREASTFEEDING FREQUENCY  A healthy, full-term baby may breastfeed as often as every hour or space his or her feedings to every 3 hours.   Watch your baby for signs of hunger. Nurse your baby if he or she shows signs of hunger. How often you nurse will vary from baby to baby.   Nurse as often as the baby requests, or when you feel the need to reduce the fullness of your breasts.   Awaken the baby if it has been 3 4 hours since the last feeding.   Frequent feeding will help the mother make more milk and will help prevent problems, such as sore nipples and engorgement of the breasts.  BABY'S POSITION AT THE BREAST  Whether lying down or sitting, be sure that the baby's tummy is facing your tummy.   Support the breast with 4 fingers underneath the breast and the thumb above. Make sure your fingers are well away from the nipple and baby's mouth.   Stroke the baby's lips gently with your finger or nipple.   When the baby's mouth is open wide enough, place all of your nipple and as much of the  areola as possible into your baby's mouth.   Pull the baby in close so the tip of the nose and the baby's cheeks touch the breast during the feeding.  FEEDINGS AND SUCTION  The length of each feeding varies from baby to baby and from feeding to feeding.   The baby must suck about 2 3 minutes for your milk to get to him or her. This is called a "let down." For this reason, allow the baby to feed on each breast as long as he or she wants. Your baby will end the feeding when he or she has received the right balance of nutrients.   To break the suction, put your finger into the corner of the baby's mouth and slide it between his or her gums before removing your breast from his or her mouth. This will help prevent sore nipples.  HOW TO TELL WHETHER YOUR BABY IS GETTING ENOUGH BREAST MILK. Wondering whether or not your baby is getting enough milk is a common concern among mothers. You can be assured that your baby is getting enough milk if:   Your baby is actively sucking and you hear swallowing.   Your baby seems relaxed and satisfied after a feeding.   Your baby nurses at least 8 12 times in a 24 hour time period. Nurse your baby until he or she unlatches or falls asleep at the first breast (at least 10 20 minutes), then offer the second side.   Your baby is wetting 5 6 disposable diapers (6 8 cloth diapers) in a 24 hour period by 5 6 days of age.     Your baby is having at least 3 4 stools every 24 hours for the first 6 weeks. The stool should be soft and yellow.   Your baby should gain 4 7 ounces per week after he or she is 4 days old.   Your breasts feel softer after nursing.  REDUCING BREAST ENGORGEMENT  In the first week after your baby is born, you may experience signs of breast engorgement. When breasts are engorged, they feel heavy, warm, full, and may be tender to the touch. You can reduce engorgement if you:   Nurse frequently, every 2 3 hours. Mothers who breastfeed  early and often have fewer problems with engorgement.   Place light ice packs on your breasts for 10 20 minutes between feedings. This reduces swelling. Wrap the ice packs in a lightweight towel to protect your skin. Bags of frozen vegetables work well for this purpose.   Take a warm shower or apply warm, moist heat to your breast for 5 10 minutes just before each feeding. This increases circulation and helps the milk flow.   Gently massage your breast before and during the feeding. Using your finger tips, massage from the chest wall towards your nipple in a circular motion.   Make sure that the baby empties at least one breast at every feeding before switching sides.   Use a breast pump to empty the breasts if your baby is sleepy or not nursing well. You may also want to pump if you are returning to work oryou feel you are getting engorged.   Avoid bottle feeds, pacifiers, or supplemental feedings of water or juice in place of breastfeeding. Breast milk is all the food your baby needs. It is not necessary for your baby to have water or formula. In fact, to help your breasts make more milk, it is best not to give your baby supplemental feedings during the early weeks.   Be sure the baby is latched on and positioned properly while breastfeeding.   Wear a supportive bra, avoiding underwire styles.   Eat a balanced diet with enough fluids.   Rest often, relax, and take your prenatal vitamins to prevent fatigue, stress, and anemia.  If you follow these suggestions, your engorgement should improve in 24 48 hours. If you are still experiencing difficulty, call your lactation consultant or caregiver.  CARING FOR YOURSELF Take care of your breasts  Bathe or shower daily.   Avoid using soap on your nipples.   Start feedings on your left breast at one feeding and on your right breast at the next feeding.   You will notice an increase in your milk supply 2 5 days after delivery.  You may feel some discomfort from engorgement, which makes your breasts very firm and often tender. Engorgement "peaks" out within 24 48 hours. In the meantime, apply warm moist towels to your breasts for 5 10 minutes before feeding. Gentle massage and expression of some milk before feeding will soften your breasts, making it easier for your baby to latch on.   Wear a well-fitting nursing bra, and air dry your nipples for a 3 4minutes after each feeding.   Only use cotton bra pads.   Only use pure lanolin on your nipples after nursing. You do not need to wash it off before feeding the baby again. Another option is to express a few drops of breast milk and gently massage it into your nipples.  Take care of yourself  Eat well-balanced meals and nutritious snacks.     Drinking milk, fruit juice, and water to satisfy your thirst (about 8 glasses a day).   Get plenty of rest.  Avoid foods that you notice affect the baby in a bad way.  SEEK MEDICAL CARE IF:   You have difficulty with breastfeeding and need help.   You have a hard, red, sore area on your breast that is accompanied by a fever.   Your baby is too sleepy to eat well or is having trouble sleeping.   Your baby is wetting less than 6 diapers a day, by 5 days of age.   Your baby's skin or white part of his or her eyes is more yellow than it was in the hospital.   You feel depressed.  Document Released: 02/23/2005 Document Revised: 08/25/2011 Document Reviewed: 05/24/2011 Exit[employer] Patient Information 2013 [employer].  

## 2012-07-25 NOTE — Progress Notes (Signed)
Pulse: 68

## 2012-07-26 LAB — WET PREP, GENITAL: Trich, Wet Prep: NONE SEEN

## 2012-07-28 ENCOUNTER — Encounter: Payer: Self-pay | Admitting: *Deleted

## 2012-08-13 ENCOUNTER — Inpatient Hospital Stay (HOSPITAL_COMMUNITY)
Admission: AD | Admit: 2012-08-13 | Discharge: 2012-08-14 | Disposition: A | Discharge status: Home / Self Care | Payer: Medicaid Other | Source: Ambulatory Visit | Attending: Obstetrics & Gynecology | Admitting: Obstetrics & Gynecology

## 2012-08-13 ENCOUNTER — Encounter (HOSPITAL_COMMUNITY): Payer: Self-pay

## 2012-08-13 DIAGNOSIS — O2390 Unspecified genitourinary tract infection in pregnancy, unspecified trimester: Secondary | ICD-10-CM | POA: Insufficient documentation

## 2012-08-13 DIAGNOSIS — R109 Unspecified abdominal pain: Secondary | ICD-10-CM | POA: Insufficient documentation

## 2012-08-13 DIAGNOSIS — A499 Bacterial infection, unspecified: Secondary | ICD-10-CM | POA: Insufficient documentation

## 2012-08-13 DIAGNOSIS — N760 Acute vaginitis: Secondary | ICD-10-CM | POA: Insufficient documentation

## 2012-08-13 DIAGNOSIS — B9689 Other specified bacterial agents as the cause of diseases classified elsewhere: Secondary | ICD-10-CM | POA: Insufficient documentation

## 2012-08-13 DIAGNOSIS — N949 Unspecified condition associated with female genital organs and menstrual cycle: Secondary | ICD-10-CM | POA: Insufficient documentation

## 2012-08-13 LAB — OB RESULTS CONSOLE GC/CHLAMYDIA: Gonorrhea: NEGATIVE

## 2012-08-13 NOTE — MAU Note (Signed)
Yesterday started having some clear mucousy d/c and mild cramping. Today cramping conts and leaking is like clear fld.

## 2012-08-13 NOTE — MAU Provider Note (Signed)
History     CSN: [id number]  Arrival date and time: 08/13/12 2236   None     Chief Complaint  Patient presents with  . Abdominal Cramping  . Vaginal Discharge   HPI  Pt is a G5P4004 at 17.4 wks IUP here with report of clear mucousy d/c and mild cramping. Today cramping continues and leaking is like clear mucus-like fluid.  No report of vaginal bleeding.     Past Medical History  Diagnosis Date  . Chlamydia Jan 2014    Past Surgical History  Procedure Laterality Date  . No past surgeries      Family History  Problem Relation Age of Onset  . Hypertension Mother   . Hypertension Maternal Grandmother     History  Substance Use Topics  . Smoking status: Former Smoker    Quit date: 04/09/2012  . Smokeless tobacco: Never Used  . Alcohol Use: No     Comment: occasional/not since pregnancy    Allergies: No Known Allergies  Prescriptions prior to admission  Medication Sig Dispense Refill  . acetaminophen (TYLENOL) 500 MG tablet Take 500 mg by mouth as needed for pain.      . Fe Fum-FePoly-FA-Vit C-Vit B3 (INTEGRA F) 125-1 MG CAPS Take 1 tablet by mouth daily.  30 capsule  1  . Prenatal Vit-Fe Fumarate-FA (PRENATAL MULTIVITAMIN) TABS Take 1 tablet by mouth daily with breakfast.      . promethazine (PHENERGAN) 12.5 MG tablet Take 1 tablet (12.5 mg total) by mouth every 6 (six) hours as needed for nausea.  30 tablet  0    Review of Systems  Gastrointestinal: Positive for abdominal pain (cramping).  Genitourinary:       Clear discharge  All other systems reviewed and are negative.   Physical Exam   Blood pressure 118/59, pulse 85, temperature 98.3 F (36.8 C), resp. rate 18, height 5\' 4"  (1.626 m), weight 71.578 kg (157 lb 12.8 oz), last menstrual period 04/12/2012, SpO2 100.00%.  Physical Exam  Constitutional: She is oriented to person, place, and time. She appears well-developed and well-nourished. No distress.  HENT:  Head: Normocephalic.  Neck: Normal range  of motion. Neck supple.  Cardiovascular: Normal rate and regular rhythm.   Respiratory: Effort normal and breath sounds normal. No respiratory distress.  GI: Soft. There is no tenderness.  Genitourinary: Cervix exhibits motion tenderness. Vaginal discharge (yellow, copius, frothy discharge  ) found.  Musculoskeletal: Normal range of motion. She exhibits no edema.  Neurological: She is alert and oriented to person, place, and time.  Skin: Skin is warm and dry.   Dilation: Closed Effacement (%): Thick Cervical Position: Posterior Exam by:: W. Jerolyn Center, CNM  Fern - negative  MAU Course  Procedures  Results for orders placed during the hospital encounter of 08/13/12 (from the past 24 hour(s))  URINALYSIS, ROUTINE W REFLEX MICROSCOPIC     Status: Abnormal   Collection Time    08/13/12 10:51 PM      Result Value Range   Color, Urine YELLOW  YELLOW   APPearance CLEAR  CLEAR   Specific Gravity, Urine <1.005 (*) 1.005 - 1.030   pH 5.5  5.0 - 8.0   Glucose, UA NEGATIVE  NEGATIVE mg/dL   Hgb urine dipstick NEGATIVE  NEGATIVE   Bilirubin Urine NEGATIVE  NEGATIVE   Ketones, ur NEGATIVE  NEGATIVE mg/dL   Protein, ur NEGATIVE  NEGATIVE mg/dL   Urobilinogen, UA 0.2  0.0 - 1.0 mg/dL   Nitrite NEGATIVE  NEGATIVE   Leukocytes, UA TRACE (*) NEGATIVE  URINE MICROSCOPIC-ADD ON     Status: None   Collection Time    08/13/12 10:51 PM      Result Value Range   Squamous Epithelial / LPF RARE  RARE   WBC, UA 3-6  <3 WBC/hpf   RBC / HPF 0-2  <3 RBC/hpf   Bacteria, UA RARE  RARE  WET PREP, GENITAL     Status: Abnormal   Collection Time    08/13/12 11:53 PM      Result Value Range   Yeast Wet Prep HPF POC NONE SEEN  NONE SEEN   Trich, Wet Prep NONE SEEN  NONE SEEN   Clue Cells Wet Prep HPF POC FEW (*) NONE SEEN   WBC, Wet Prep HPF POC TOO NUMEROUS TO COUNT (*) NONE SEEN    Ultrasound: LIMITED OBSTETRIC ULTRASOUND  Number of Fetuses: 1  Heart Rate: 155bpm  Movement: Present  Presentation:  Cephalic  Placental Location: Left lateral  Previa: No  Amniotic Fluid (Subjective): Normal  Vertical pocket: 5.2cm  BPD: 3.8cm 17w 4d EDC: 01/16/2013  MATERNAL FINDINGS:  Cervix: Closed - 4.3 cm  Uterus/Adnexae: Left ovary unremarkable. Right ovary not  visualized.  No evidence of free fluid or adnexal mass.  IMPRESSION:  Single living intrauterine gestation in cephalic presentation with  assigned gestational age of [redacted] weeks 3 days.  Normal amniotic fluid volume.  Assessment and Plan  G5P4004 at 17.5 wks Bacterial Vaginosis  Plan: DC to home GC/CT pend RX Flagyl Provide reassurance  [facility]) 08/13/2012, 11:33 PM

## 2012-08-13 NOTE — MAU Note (Signed)
Patient states she was recently tested for STIs and has not been sexually active since March, does not feel we need to test for those.

## 2012-08-14 ENCOUNTER — Inpatient Hospital Stay (HOSPITAL_COMMUNITY): Payer: Medicaid Other

## 2012-08-14 DIAGNOSIS — A499 Bacterial infection, unspecified: Secondary | ICD-10-CM

## 2012-08-14 DIAGNOSIS — N760 Acute vaginitis: Secondary | ICD-10-CM

## 2012-08-14 LAB — URINALYSIS, ROUTINE W REFLEX MICROSCOPIC
Hgb urine dipstick: NEGATIVE
Nitrite: NEGATIVE
Protein, ur: NEGATIVE mg/dL
Urobilinogen, UA: 0.2 mg/dL (ref 0.0–1.0)

## 2012-08-14 LAB — URINE MICROSCOPIC-ADD ON

## 2012-08-14 LAB — WET PREP, GENITAL: Yeast Wet Prep HPF POC: NONE SEEN

## 2012-08-14 MED ORDER — METRONIDAZOLE 500 MG PO TABS: 500.0000 mg | ORAL_TABLET | Freq: Two times a day (BID) | ORAL | Status: DC

## 2012-08-15 LAB — GC/CHLAMYDIA PROBE AMP
CT Probe RNA: NEGATIVE
GC Probe RNA: NEGATIVE

## 2012-08-20 ENCOUNTER — Inpatient Hospital Stay (HOSPITAL_COMMUNITY): Payer: Medicaid Other

## 2012-08-20 ENCOUNTER — Inpatient Hospital Stay (HOSPITAL_COMMUNITY)
Admission: AD | Admit: 2012-08-20 | Discharge: 2012-08-20 | Disposition: A | Discharge status: Home / Self Care | Payer: Medicaid Other | Source: Ambulatory Visit | Attending: Obstetrics and Gynecology | Admitting: Obstetrics and Gynecology

## 2012-08-20 ENCOUNTER — Encounter (HOSPITAL_COMMUNITY): Payer: Self-pay | Admitting: *Deleted

## 2012-08-20 DIAGNOSIS — O209 Hemorrhage in early pregnancy, unspecified: Secondary | ICD-10-CM | POA: Insufficient documentation

## 2012-08-20 DIAGNOSIS — O4692 Antepartum hemorrhage, unspecified, second trimester: Secondary | ICD-10-CM

## 2012-08-20 DIAGNOSIS — R109 Unspecified abdominal pain: Secondary | ICD-10-CM | POA: Insufficient documentation

## 2012-08-20 DIAGNOSIS — O4690 Antepartum hemorrhage, unspecified, unspecified trimester: Secondary | ICD-10-CM

## 2012-08-20 LAB — CBC
MCH: 28.9 pg (ref 26.0–34.0)
MCV: 86.1 fL (ref 78.0–100.0)
Platelets: 219 10*3/uL (ref 150–400)
RDW: 12.4 % (ref 11.5–15.5)
WBC: 10.2 10*3/uL (ref 4.0–10.5)

## 2012-08-20 LAB — URINALYSIS, ROUTINE W REFLEX MICROSCOPIC
Protein, ur: 30 mg/dL — AB
Urobilinogen, UA: 0.2 mg/dL (ref 0.0–1.0)

## 2012-08-20 NOTE — Progress Notes (Signed)
Written and verbal d/c instructions given and understanding voiced. 

## 2012-08-20 NOTE — MAU Note (Signed)
Have been having some vag bleeding off and on May 13th. Told had hole in placenta. Have been here several times with bleeding but alittle bit more tonight and some abdominal pain. Mostly see blood when wipe after going to [facility].

## 2012-08-20 NOTE — MAU Provider Note (Signed)
History     CSN: [id number]  Arrival date and time: 08/20/12 0329   None     Chief Complaint  Patient presents with  . Vaginal Bleeding  . Abdominal Pain   HPI  Ana [name] is a 22 y.o. [id number] at [redacted]w[redacted]d who presents tonight with vaginal bleeding. She states that since about midnight she has had some pink tinged blood when she wipes. She denies any recent intercourse. She was seen in here last week and given RX for flagyl. She has a couple more days left on that rx. Her next appointment is on Monday.   Past Medical History  Diagnosis Date  . Chlamydia Jan 2014    Past Surgical History  Procedure Laterality Date  . No past surgeries      Family History  Problem Relation Age of Onset  . Hypertension Mother   . Hypertension Maternal Grandmother     History  Substance Use Topics  . Smoking status: Former Smoker    Quit date: 04/09/2012  . Smokeless tobacco: Never Used  . Alcohol Use: No     Comment: occasional/not since pregnancy    Allergies: No Known Allergies  Prescriptions prior to admission  Medication Sig Dispense Refill  . Fe Fum-FePoly-FA-Vit C-Vit B3 (INTEGRA F) 125-1 MG CAPS Take 1 tablet by mouth daily.  30 capsule  1  . metroNIDAZOLE (FLAGYL) 500 MG tablet Take 1 tablet (500 mg total) by mouth 2 (two) times daily.  14 tablet  0  . promethazine (PHENERGAN) 12.5 MG tablet Take 1 tablet (12.5 mg total) by mouth every 6 (six) hours as needed for nausea.  30 tablet  0  . acetaminophen (TYLENOL) 500 MG tablet Take 500 mg by mouth as needed for pain.      . Prenatal Vit-Fe Fumarate-FA (PRENATAL MULTIVITAMIN) TABS Take 1 tablet by mouth daily with breakfast.        Review of Systems  Constitutional: Negative for fever and chills.  Eyes: Negative for blurred vision.  Respiratory: Negative for shortness of breath.   Cardiovascular: Negative for chest pain.  Gastrointestinal: Positive for abdominal pain ("all along both sides"). Negative for nausea,  vomiting, diarrhea and constipation.  Genitourinary: Negative for dysuria, urgency and frequency.  Musculoskeletal: Negative for myalgias.  Neurological: Negative for dizziness and headaches.   Physical Exam   Blood pressure 120/63, pulse 82, temperature 98.7 F (37.1 C), resp. rate 20, height 5\' 4"  (1.626 m), weight 70.035 kg (154 lb 6.4 oz), last menstrual period 04/12/2012.  Physical Exam  Nursing note and vitals reviewed. Constitutional: She is oriented to person, place, and time. She appears well-developed and well-nourished. No distress.  Cardiovascular: Normal rate.   Respiratory: Effort normal.  GI: Soft. There is no tenderness.  Genitourinary:   External: no lesion Vagina: small amount of pink blood in vagina Cervix: posterior/closed/thick/firm Uterus: AGA   Neurological: She is alert and oriented to person, place, and time.  Skin: Skin is warm and dry.  Psychiatric: She has a normal mood and affect.    MAU Course  Procedures  Results for orders placed during the hospital encounter of 08/20/12 (from the past 24 hour(s))  URINALYSIS, ROUTINE W REFLEX MICROSCOPIC     Status: Abnormal   Collection Time    08/20/12  3:38 AM      Result Value Range   Color, Urine YELLOW  YELLOW   APPearance CLEAR  CLEAR   Specific Gravity, Urine 1.020  1.005 - 1.030  pH 7.0  5.0 - 8.0   Glucose, UA NEGATIVE  NEGATIVE mg/dL   Hgb urine dipstick LARGE (*) NEGATIVE   Bilirubin Urine NEGATIVE  NEGATIVE   Ketones, ur NEGATIVE  NEGATIVE mg/dL   Protein, ur 30 (*) NEGATIVE mg/dL   Urobilinogen, UA 0.2  0.0 - 1.0 mg/dL   Nitrite NEGATIVE  NEGATIVE   Leukocytes, UA MODERATE (*) NEGATIVE  URINE MICROSCOPIC-ADD ON     Status: Abnormal   Collection Time    08/20/12  3:38 AM      Result Value Range   Squamous Epithelial / LPF FEW (*) RARE   WBC, UA 7-10  <3 WBC/hpf   RBC / HPF 11-20  <3 RBC/hpf   Bacteria, UA FEW (*) RARE  CBC     Status: Abnormal   Collection Time    08/20/12  4:00  AM      Result Value Range   WBC 10.2  4.0 - 10.5 K/uL   RBC 3.53 (*) 3.87 - 5.11 MIL/uL   Hemoglobin 10.2 (*) 12.0 - 15.0 g/dL   HCT 81.1 (*) 91.4 - 78.2 %   MCV 86.1  78.0 - 100.0 fL   MCH 28.9  26.0 - 34.0 pg   MCHC 33.6  30.0 - 36.0 g/dL   RDW 95.6  21.3 - 08.6 %   Platelets 219  150 - 400 K/uL   *RADIOLOGY REPORT*  Clinical Data: Pregnant, bleeding  LIMITED OBSTETRIC ULTRASOUND AND TRANSVAGINAL OB US  Number of Fetuses: 1  Heart Rate: 150 bpm  Movement: Present  Presentation: Cephalic  Placental Location: Left anterolateral  Previa: No  Amniotic Fluid (Subjective): Normal  MATERNAL FINDINGS:  Cervix: 3.9 cm, closed.  IMPRESSION:  Single live intrauterine gestation  Original Report Authenticated By: D. Andria Rhein, MD    Assessment and Plan   1. Vaginal bleeding in pregnancy, second trimester    Danger signs reviewed Bed rest/pelvic rest FU on Monday as scheduled  Tawnya Crook 08/20/2012, 3:54 AM

## 2012-08-20 NOTE — Progress Notes (Signed)
Spec exam and bimanual

## 2012-08-21 LAB — URINE CULTURE
Colony Count: NO GROWTH
Culture: NO GROWTH

## 2012-08-22 ENCOUNTER — Encounter: Payer: Self-pay | Admitting: Obstetrics and Gynecology

## 2012-08-22 ENCOUNTER — Ambulatory Visit (INDEPENDENT_AMBULATORY_CARE_PROVIDER_SITE_OTHER): Payer: Medicaid Other | Admitting: Obstetrics and Gynecology

## 2012-08-22 VITALS — BP 113/68 | Temp 98.3°F | Wt 150.4 lb

## 2012-08-22 DIAGNOSIS — Z3482 Encounter for supervision of other normal pregnancy, second trimester: Secondary | ICD-10-CM

## 2012-08-22 DIAGNOSIS — O4290 Premature rupture of membranes, unspecified as to length of time between rupture and onset of labor, unspecified weeks of gestation: Secondary | ICD-10-CM

## 2012-08-22 LAB — POCT URINALYSIS DIP (DEVICE)
Glucose, UA: NEGATIVE mg/dL
Nitrite: NEGATIVE
Urobilinogen, UA: 1 mg/dL (ref 0.0–1.0)

## 2012-08-22 MED ORDER — PROMETHAZINE HCL 12.5 MG PO TABS: 12.5000 mg | ORAL_TABLET | Freq: Four times a day (QID) | ORAL | Status: DC | PRN

## 2012-08-22 NOTE — Progress Notes (Signed)
Patient reports pelvic pain consistent with round ligament pain. Patient reports occasional spotting. Cervix unchanged- closed, long, thick and posterior. Large leuk and blood in UA, will send urine Cx. F/U anatomy ultrasound this week

## 2012-08-22 NOTE — Progress Notes (Signed)
Pulse- 81  Pain-having cramps   Pt went to MAU for bleeding this past Friday  Refill on phenergan

## 2012-08-22 NOTE — Progress Notes (Signed)
Quad screen today

## 2012-08-24 ENCOUNTER — Encounter (HOSPITAL_COMMUNITY): Payer: Self-pay

## 2012-08-24 ENCOUNTER — Inpatient Hospital Stay (HOSPITAL_COMMUNITY)
Admission: AD | Admit: 2012-08-24 | Discharge: 2012-08-24 | Disposition: A | Discharge status: Home / Self Care | Payer: Medicaid Other | Source: Ambulatory Visit | Attending: Family Medicine | Admitting: Family Medicine

## 2012-08-24 DIAGNOSIS — O4692 Antepartum hemorrhage, unspecified, second trimester: Secondary | ICD-10-CM

## 2012-08-24 DIAGNOSIS — O4690 Antepartum hemorrhage, unspecified, unspecified trimester: Secondary | ICD-10-CM

## 2012-08-24 DIAGNOSIS — R109 Unspecified abdominal pain: Secondary | ICD-10-CM | POA: Insufficient documentation

## 2012-08-24 DIAGNOSIS — O209 Hemorrhage in early pregnancy, unspecified: Secondary | ICD-10-CM | POA: Insufficient documentation

## 2012-08-24 LAB — CULTURE, OB URINE
Colony Count: NO GROWTH
Organism ID, Bacteria: NO GROWTH

## 2012-08-24 MED ORDER — IBUPROFEN 600 MG PO TABS
600.0000 mg | ORAL_TABLET | Freq: Once | ORAL | Status: AC
  Administered 2012-08-24: 600 mg via ORAL
  Filled 2012-08-24: qty 1

## 2012-08-24 NOTE — MAU Provider Note (Signed)
  History     CSN: [id number]  Arrival date and time: 08/24/12 0605   None     Chief Complaint  Patient presents with  . Vaginal Bleeding   HPIPt is [redacted]w[redacted]d G5P4004 with known mod SCH.  Pt was last seen in clinic on 10/22/2012 with abd cramping and some bleeding.  Pt has been on bedrest and pelvic rest except for bathroom.  Pt states that her bleeding has gotten worse passing some clots and is having cramping that has become very  Uncomfortable.  Pt denies constipation or diarrhea or UTI symptoms.  Pt has had a decrease in appetite.  Pt is scheduled for complete ultrasound with anatomy scan tomorrow.    Pt states she has been bleeding for about a week and a half now. Passed some clots. States some abdominal pain as well.   Past Medical History  Diagnosis Date  . Chlamydia Jan 2014    Past Surgical History  Procedure Laterality Date  . No past surgeries      Family History  Problem Relation Age of Onset  . Hypertension Mother   . Hypertension Maternal Grandmother     History  Substance Use Topics  . Smoking status: Former Smoker    Quit date: 04/09/2012  . Smokeless tobacco: Never Used  . Alcohol Use: No     Comment: occasional/not since pregnancy    Allergies: No Known Allergies  Prescriptions prior to admission  Medication Sig Dispense Refill  . acetaminophen (TYLENOL) 500 MG tablet Take 500 mg by mouth as needed for pain.      . Fe Fum-FePoly-FA-Vit C-Vit B3 (INTEGRA F) 125-1 MG CAPS Take 1 tablet by mouth daily.  30 capsule  1  . Prenatal Vit-Fe Fumarate-FA (PRENATAL MULTIVITAMIN) TABS Take 1 tablet by mouth daily with breakfast.      . metroNIDAZOLE (FLAGYL) 500 MG tablet Take 1 tablet (500 mg total) by mouth 2 (two) times daily.  14 tablet  0  . promethazine (PHENERGAN) 12.5 MG tablet Take 1 tablet (12.5 mg total) by mouth every 6 (six) hours as needed for nausea.  30 tablet  0    ROS Physical Exam   Blood pressure 114/55, pulse 78, temperature 98 F (36.7  C), temperature source Oral, resp. rate 18, last menstrual period 04/12/2012, SpO2 100.00%.  Physical Exam  Vitals reviewed. Constitutional: She is oriented to person, place, and time. She appears well-developed and well-nourished.  HENT:  Head: Normocephalic.  Eyes: Pupils are equal, round, and reactive to light.  Neck: Normal range of motion. Neck supple.  Cardiovascular: Normal rate.   Respiratory: Effort normal.  GI: Soft. She exhibits no distension. There is no tenderness. There is no rebound.  Genitourinary:  sm-mod amount of pink blood in vault;cervix posterior unable to visualize with speculum; cervx closed with palpation -posterior difficult to reach  Musculoskeletal: Normal range of motion.  Neurological: She is alert and oriented to person, place, and time.  Skin: Skin is warm and dry.  Psychiatric: She has a normal mood and affect.    MAU Course  Procedures FHT 154 with doppler- pt reassured hearing FHT Discussed with Dr. Shawnie Pons- may give Ibuprofen 600mg  PO now Pt may follow up with scheduled ultrasound tomorrow   Assessment and Plan  Bleeding in pregnancy - 2nd trimester with Mod [facility] Bed rest with bathroom privileges Tylenol for cramping  LINEBERRY,SUSAN 08/24/2012, 7:13 AM

## 2012-08-24 NOTE — MAU Note (Signed)
Pt states she has been bleeding for about a week and a half now. Passed some clots. States some abdominal pain as well.

## 2012-08-24 NOTE — MAU Provider Note (Signed)
Attestation of Attending Supervision of Advanced Practitioner: Evaluation and management procedures were performed by the PA/NP/CNM/OB Fellow under my supervision/collaboration. Chart reviewed and agree with management and plan.  Tahlor Berenguer V 08/24/2012 6:59 AM

## 2012-08-24 NOTE — MAU Provider Note (Signed)
Attestation of Attending Supervision of Advanced Practitioner (CNM/NP): Evaluation and management procedures were performed by the Advanced Practitioner under my supervision and collaboration.  I have reviewed the Advanced Practitioner's note and chart, and I agree with the management and plan.  Yaqueline Gutter 08/24/2012 4:43 PM   

## 2012-08-25 ENCOUNTER — Inpatient Hospital Stay (HOSPITAL_COMMUNITY)
Admission: AD | Admit: 2012-08-25 | Discharge: 2012-08-25 | Disposition: A | Discharge status: Home / Self Care | Payer: Medicaid Other | Source: Ambulatory Visit | Attending: Obstetrics and Gynecology | Admitting: Obstetrics and Gynecology

## 2012-08-25 ENCOUNTER — Encounter: Payer: Self-pay | Admitting: *Deleted

## 2012-08-25 ENCOUNTER — Encounter: Payer: Self-pay | Admitting: Family Medicine

## 2012-08-25 ENCOUNTER — Encounter (HOSPITAL_COMMUNITY): Payer: Self-pay | Admitting: *Deleted

## 2012-08-25 ENCOUNTER — Ambulatory Visit (HOSPITAL_COMMUNITY)
Admission: RE | Admit: 2012-08-25 | Discharge: 2012-08-25 | Disposition: A | Discharge status: Home / Self Care | Payer: Medicaid Other | Source: Ambulatory Visit | Attending: Family Medicine | Admitting: Family Medicine

## 2012-08-25 DIAGNOSIS — Z3689 Encounter for other specified antenatal screening: Secondary | ICD-10-CM | POA: Insufficient documentation

## 2012-08-25 DIAGNOSIS — Z3482 Encounter for supervision of other normal pregnancy, second trimester: Secondary | ICD-10-CM

## 2012-08-25 DIAGNOSIS — O4290 Premature rupture of membranes, unspecified as to length of time between rupture and onset of labor, unspecified weeks of gestation: Secondary | ICD-10-CM | POA: Insufficient documentation

## 2012-08-25 DIAGNOSIS — O209 Hemorrhage in early pregnancy, unspecified: Secondary | ICD-10-CM | POA: Insufficient documentation

## 2012-08-25 LAB — URINALYSIS, ROUTINE W REFLEX MICROSCOPIC
Glucose, UA: NEGATIVE mg/dL
Ketones, ur: NEGATIVE mg/dL
Nitrite: NEGATIVE
Protein, ur: NEGATIVE mg/dL
Urobilinogen, UA: 0.2 mg/dL (ref 0.0–1.0)

## 2012-08-25 LAB — CBC
MCV: 86 fL (ref 78.0–100.0)
Platelets: 229 10*3/uL (ref 150–400)
RBC: 3.93 MIL/uL (ref 3.87–5.11)
RDW: 12.3 % (ref 11.5–15.5)
WBC: 12.6 10*3/uL — ABNORMAL HIGH (ref 4.0–10.5)

## 2012-08-25 LAB — URINE MICROSCOPIC-ADD ON

## 2012-08-25 MED ORDER — LACTATED RINGERS IV SOLN
INTRAVENOUS | Status: DC
  Administered 2012-08-25: 10:00:00 via INTRAVENOUS

## 2012-08-25 MED ORDER — OXYCODONE-ACETAMINOPHEN 5-325 MG PO TABS: 2.0000 | ORAL_TABLET | ORAL | Status: DC | PRN

## 2012-08-25 MED ORDER — OXYCODONE-ACETAMINOPHEN 5-325 MG PO TABS
2.0000 | ORAL_TABLET | Freq: Once | ORAL | Status: AC
  Administered 2012-08-25: 2 via ORAL
  Filled 2012-08-25: qty 2

## 2012-08-25 NOTE — MAU Note (Signed)
Pt here from ultrasound with low amniotic fluid and brought to MAU for further evaluation.  FHR 144 on ultrasound.  Pt is crying and worried about outcome of pregnancy and needing emotional support.  Pt is here alone today.  Pt states she has been having some abd cramping and  leaking fluid since 0300 this morning.   Pt also reports vaginal bleeding like her normal menstral period at that time as well.

## 2012-08-25 NOTE — MAU Provider Note (Signed)
History    CSN: [id number]  Arrival date and time: 08/25/12 4098  First Provider Initiated Contact with Patient 08/25/12 1023  Chief Complaint   Patient presents with   .  Vaginal Bleeding   .  Rupture of Membranes    HPI  22 yo G5P4004 at [redacted]w[redacted]d who is sent from radiology department for evaluation of possible rupture of membrane. I received a phone call from radiology department while patient was having her anatomy ultrasound with a report of decreased amniotic fluid. Anatomy could not be completed secondary to low fluid. Patient reports persistent vaginal bleeding since the beginning of the month. The bleeding is similar to that of a period. She reports occasional cramping pain. Patient is uncertain whether or not she broke her water but states that her bleeding seems to increase in the evening. Patient is very upset at the idea of losing the pregnancy, particularly since she has had 4 previous normal pregnancies. Patient also feels that we are not helping her get better. 1115 Prudencio Pair, VP on unit. Discussion with Prudencio Pair and Dr. Jolayne Panther reference pt staying several hours for observation. Dr. Jolayne Panther at bedside to discuss change in plan of care with pt/grandmother and pt in agreement with staying on unit. Dr. Jolayne Panther and RN providing emotional support to pt.  Past Medical History   Diagnosis  Date   .  Chlamydia  Jan 2014    Past Surgical History   Procedure  Laterality  Date   .  No past surgeries      Family History   Problem  Relation  Age of Onset   .  Hypertension  Mother    .  Hypertension  Maternal Grandmother     History   Substance Use Topics   .  Smoking status:  Former Smoker     Quit date:  04/09/2012   .  Smokeless tobacco:  Never Used   .  Alcohol Use:  No      Comment: occasional/not since pregnancy    Allergies: No Known Allergies  Prescriptions prior to admission   Medication  Sig  Dispense  Refill   .  acetaminophen (TYLENOL) 500 MG tablet  Take  500 mg by mouth every 6 (six) hours as needed for pain.     .  IRON PO  Take 1 tablet by mouth daily.     .  Prenatal Vit-Fe Fumarate-FA (PRENATAL MULTIVITAMIN) TABS  Take 1 tablet by mouth daily with breakfast.     .  promethazine (PHENERGAN) 12.5 MG tablet  Take 12.5 mg by mouth every 6 (six) hours as needed for nausea.      ROS  Physical Exam   Blood pressure 128/55, pulse 88, temperature 97.9 F (36.6 C), temperature source Oral, resp. rate 16, height 5\' 4"  (1.626 m), weight 150 lb (68.04 kg), last menstrual period 04/12/2012, SpO2 100.00%.  Physical Exam  GENERAL: Well-developed, well-nourished female in no acute distress.  ABDOMEN: Soft, nontender, nondistended.  PELVIC: Normal external female genitalia. Vagina is pink and rugated. SSE: pooling of bloody discharge obscuring cervix. Bloody discharged absorbed with fox swab allowing for the visualization of a long and closed cervix. Cervix is also posterior.  EXTREMITIES: No cyanosis, clubbing, or edema, 2+ distal pulses.  MAU Course   Procedures  MDM  Amniosure not collected secondary to presence of blood  No ferning pattern seen under the microscope  Assessment and Plan   21 yo G5P4004 at [redacted]w[redacted]d with vaginal bleeding in  second trimester and ultrasound findings concerning for rupture of membrane  - Explained to the patient the concern for PPROM and associated risk of developing chorioamnionitis  - Explained to the patient that given extreme pre-viable age, no medical intervention can be provided at this time. If patient reaches a viable age of 23 weeks, she would then be admitted for betamethasone, antibiotics and full neonatal resuscitation.  - Patient very upset at the idea that nothing can be done right now to stop the process.  - Offered patient to be discharged home, follow up in [facility] next week- An appointment has been scheduled for 6/23 at 8 am  - Patient advised to monitor for worsening cramping pain, worsening in her vaginal  bleeding, and to monitor her temperature at home. Patient advised to return to MAU with worsening symptoms or temperature of 100.4 or greater.  - Emotional support provided.  Case discussed with MFM attending (Dr. Sherrie George) who reviewed the case and ultrasound and agrees with above outpatient management

## 2012-08-25 NOTE — Progress Notes (Signed)
08/25/12 1200  Clinical Encounter Type  Visited With Health care provider;Patient not available  Visit Type Follow-up   Attempted follow-up visit, but pt prefers to go back to sleep.  Please page for further support as needed:  [phone].  Thank you!  [address] [place], [place] [phone]

## 2012-08-25 NOTE — MAU Provider Note (Addendum)
History     CSN: [id number]  Arrival date and time: 08/25/12 4098   First Provider Initiated Contact with Patient 08/25/12 1023      Chief Complaint  Patient presents with  . Vaginal Bleeding  . Rupture of Membranes   HPI 22 yo G5P4004 at [redacted]w[redacted]d who is sent from radiology department for evaluation of possible rupture of membrane. I received a phone call from radiology department while patient was having her anatomy ultrasound with a report of decreased amniotic fluid. Anatomy could not be completed secondary to low fluid. Patient reports persistent vaginal bleeding since the beginning of the month. The bleeding is similar to that of a period. She reports occasional cramping pain. Patient is uncertain whether or not she broke her water but states that her bleeding seems to increase in the evening. Patient is very upset at the idea of losing the pregnancy, particularly since she has had 4 previous normal pregnancies. Patient also feels that we are not helping her get better.  1115 Prudencio Pair, VP on unit.  Discussion with Prudencio Pair and Dr. Jolayne Panther reference pt staying several hours for observation.  Dr. Jolayne Panther at bedside to discuss change in plan of care with pt/grandmother and pt in agreement with staying on unit.  Dr. Jolayne Panther and RN providing emotional support to pt.   Past Medical History  Diagnosis Date  . Chlamydia Jan 2014    Past Surgical History  Procedure Laterality Date  . No past surgeries      Family History  Problem Relation Age of Onset  . Hypertension Mother   . Hypertension Maternal Grandmother     History  Substance Use Topics  . Smoking status: Former Smoker    Quit date: 04/09/2012  . Smokeless tobacco: Never Used  . Alcohol Use: No     Comment: occasional/not since pregnancy    Allergies: No Known Allergies  Prescriptions prior to admission  Medication Sig Dispense Refill  . acetaminophen (TYLENOL) 500 MG tablet Take 500 mg by mouth every 6  (six) hours as needed for pain.       . IRON PO Take 1 tablet by mouth daily.      . Prenatal Vit-Fe Fumarate-FA (PRENATAL MULTIVITAMIN) TABS Take 1 tablet by mouth daily with breakfast.      . promethazine (PHENERGAN) 12.5 MG tablet Take 12.5 mg by mouth every 6 (six) hours as needed for nausea.        ROS Physical Exam   Blood pressure 128/55, pulse 88, temperature 97.9 F (36.6 C), temperature source Oral, resp. rate 16, height 5\' 4"  (1.626 m), weight 150 lb (68.04 kg), last menstrual period 04/12/2012, SpO2 100.00%.  Physical Exam GENERAL: Well-developed, well-nourished female in no acute distress.  ABDOMEN: Soft, nontender, nondistended.  PELVIC: Normal external female genitalia. Vagina is pink and rugated.  SSE: pooling of bloody discharge obscuring cervix. Bloody discharged absorbed with fox swab allowing for the visualization of a long and closed cervix. Cervix is also posterior. EXTREMITIES: No cyanosis, clubbing, or edema, 2+ distal pulses.   MAU Course  Procedures  MDM Amniosure not collected secondary to presence of blood No ferning pattern seen under the microscope  Assessment and Plan  22 yo G5P4004 at [redacted]w[redacted]d with vaginal bleeding in second trimester and ultrasound findings concerning for rupture of membrane - Explained to the patient the concern for PPROM and associated risk of developing chorioamnionitis - Explained to the patient that given extreme pre-viable age, no medical intervention can be  provided at this time. If patient reaches a viable age of 23 weeks, she would then be admitted for betamethasone, antibiotics and full neonatal resuscitation. - Patient very upset at the idea that nothing can be done right now to stop the process. - Offered patient to be discharged home, follow up in [facility] next week- An appointment has been scheduled for 6/23 at 8 am - Patient advised to monitor for worsening cramping pain, worsening in her vaginal bleeding, and to monitor her  temperature at home. Patient advised to return to MAU with worsening symptoms or temperature of 100.4 or greater. - Emotional support provided.  CONSTANT,PEGGY 08/25/2012, 11:06 AM

## 2012-08-25 NOTE — MAU Provider Note (Signed)
History     CSN: [id number]  Arrival date & time 08/25/12  0913   None     Chief Complaint  Patient presents with  . Vaginal Bleeding  . Rupture of Membranes    (Consider location/radiation/quality/duration/timing/severity/associated sxs/prior treatment) HPIPt is [id number] [redacted]w[redacted]d pregnant with known [facility] with continuous bleeding during pregnancy.  Pt was seen on 6/18 for abd pain and bleeding in MAU- pt's cervix was closed and bleeding small-mod.  This morning pt was scheduled for anatomy scan- pt woke up with morning with increase in abd pain like contractions and loss of fluid.  Pt remains uncomfortable with her abd pain.  Preliminary US show loss of amniotic fluid.  Pt having mod amount of pink bleeding  Past Medical History  Diagnosis Date  . Chlamydia Jan 2014    Past Surgical History  Procedure Laterality Date  . No past surgeries      Family History  Problem Relation Age of Onset  . Hypertension Mother   . Hypertension Maternal Grandmother     History  Substance Use Topics  . Smoking status: Former Smoker    Quit date: 04/09/2012  . Smokeless tobacco: Never Used  . Alcohol Use: No     Comment: occasional/not since pregnancy    OB History   Grav Para Term Preterm Abortions TAB SAB Ect Mult Living   5 4 4       4       Review of Systems  Allergies  Review of patient's allergies indicates no known allergies.  Home Medications  No current outpatient prescriptions on file.  BP 128/55  Pulse 88  Temp(Src) 97.9 F (36.6 C) (Oral)  Resp 16  Ht 5\' 4"  (1.626 m)  Wt 68.04 kg (150 lb)  BMI 25.73 kg/m2  SpO2 100%  LMP 04/12/2012  Physical Exam  ED Course  Procedures (including critical care time)  Labs Reviewed  URINALYSIS, ROUTINE W REFLEX MICROSCOPIC - Abnormal; Notable for the following:    Hgb urine dipstick LARGE (*)    Leukocytes, UA LARGE (*)    All other components within normal limits  URINE MICROSCOPIC-ADD ON - Abnormal; Notable for the  following:    Bacteria, UA FEW (*)    All other components within normal limits  URINE CULTURE   US Ob Comp + 14 Wk  08/25/2012   OBSTETRICAL ULTRASOUND: This exam was performed within a [facility]. The OB US report was generated in the [facility] system, and faxed to the ordering physician.   This report is also available in [employer] and in the [employer]. See [facility] Obstetric US report.    No diagnosis found. Dr. Jolayne Panther here to see pt and assess   MDM

## 2012-08-25 NOTE — MAU Note (Addendum)
Pt discussing with Dr. Jolayne Panther plan of care and dissatisfaction of being discharged home.  Pt has had several MAU visits due to vaginal bleeding and verbalizes concerns of being sent home today.  Pt has hx of rapid deliveries and verbalizes not wanting to deliver at home and wishes for all treatment possible to occur.  [facility], RN spoke with Dr. Jolayne Panther outside of pt room to discuss pt's wishes.  Dr. Jolayne Panther expressed that no other treatment can be given to pt until 23 weeks and pt will be discharged home on bedrest.  1050 Gwenyth Bender, Charge RN and  Nicki Reaper, MAU director notified of situation.  Dr. Jolayne Panther and Nicki Reaper discussing plan of care.  1100 Prudencio Pair, Director of Nursing paged by [employer] Snead.  Awaiting return call at this time.  1105 Sue Penderline on unit to discuss options.

## 2012-08-25 NOTE — Progress Notes (Signed)
08/25/12 1000  Clinical Encounter Type  Visited With Patient;Health care provider (RN, Dr Jolayne Panther)  Visit Type Initial;Spiritual support;Social support (suspected PROM @19  weeks)  Referral From Nurse Gwenyth Bender, RN)  Spiritual Encounters  Spiritual Needs Emotional  Stress Factors  Patient Stress Factors Loss of control;Major life changes (tearful and sad about threatened pregnancy)   Thank you to Gwenyth Bender, RN for referral.  Ms Kreeger is tearful and sad/scared about the possible loss of this baby.  This is a very desired pregnancy.  She has four children at home (7, 6, 4, 2).  Made initial contact to offer support; will follow up after Dr Jolayne Panther completes exam and visit.  Please page as timing would be appropriate:  [phone].  Ms [place] grandmother is on the way for support, as well; per pt, she has good support from parents, brother, baby's father, and others.    [address] [place], [place] [phone]

## 2012-08-26 LAB — URINE CULTURE
Colony Count: NO GROWTH
Culture: NO GROWTH

## 2012-08-29 ENCOUNTER — Ambulatory Visit (INDEPENDENT_AMBULATORY_CARE_PROVIDER_SITE_OTHER): Payer: Medicaid Other | Admitting: Obstetrics & Gynecology

## 2012-08-29 ENCOUNTER — Other Ambulatory Visit: Payer: Self-pay | Admitting: Obstetrics & Gynecology

## 2012-08-29 ENCOUNTER — Ambulatory Visit (HOSPITAL_COMMUNITY)
Admission: RE | Admit: 2012-08-29 | Discharge: 2012-08-29 | Disposition: A | Discharge status: Home / Self Care | Payer: Medicaid Other | Source: Ambulatory Visit | Attending: Obstetrics & Gynecology | Admitting: Obstetrics & Gynecology

## 2012-08-29 ENCOUNTER — Encounter (HOSPITAL_COMMUNITY): Payer: Self-pay | Admitting: *Deleted

## 2012-08-29 ENCOUNTER — Observation Stay (HOSPITAL_COMMUNITY)
Admission: AD | Admit: 2012-08-29 | Discharge: 2012-08-30 | Disposition: A | Discharge status: Home / Self Care | Payer: Medicaid Other | Source: Ambulatory Visit | Attending: Family Medicine | Admitting: Family Medicine

## 2012-08-29 VITALS — BP 124/68 | Temp 98.3°F | Wt 151.5 lb

## 2012-08-29 DIAGNOSIS — Z3482 Encounter for supervision of other normal pregnancy, second trimester: Secondary | ICD-10-CM

## 2012-08-29 DIAGNOSIS — O021 Missed abortion: Principal | ICD-10-CM | POA: Insufficient documentation

## 2012-08-29 DIAGNOSIS — Z3480 Encounter for supervision of other normal pregnancy, unspecified trimester: Secondary | ICD-10-CM

## 2012-08-29 DIAGNOSIS — O368390 Maternal care for abnormalities of the fetal heart rate or rhythm, unspecified trimester, not applicable or unspecified: Secondary | ICD-10-CM | POA: Insufficient documentation

## 2012-08-29 DIAGNOSIS — O4290 Premature rupture of membranes, unspecified as to length of time between rupture and onset of labor, unspecified weeks of gestation: Secondary | ICD-10-CM | POA: Diagnosis present

## 2012-08-29 DIAGNOSIS — IMO0002 Reserved for concepts with insufficient information to code with codable children: Secondary | ICD-10-CM | POA: Diagnosis present

## 2012-08-29 HISTORY — DX: Reserved for concepts with insufficient information to code with codable children: IMO0002

## 2012-08-29 HISTORY — DX: Unspecified abnormal cytological findings in specimens from cervix uteri: R87.619

## 2012-08-29 LAB — POCT URINALYSIS DIP (DEVICE)
Glucose, UA: NEGATIVE mg/dL
Ketones, ur: NEGATIVE mg/dL
Protein, ur: NEGATIVE mg/dL
Urobilinogen, UA: 0.2 mg/dL (ref 0.0–1.0)

## 2012-08-29 LAB — CBC
MCHC: 33.7 g/dL (ref 30.0–36.0)
RDW: 12.2 % (ref 11.5–15.5)

## 2012-08-29 LAB — RPR: RPR Ser Ql: NONREACTIVE

## 2012-08-29 MED ORDER — LANOLIN HYDROUS EX OINT: TOPICAL_OINTMENT | CUTANEOUS | Status: DC | PRN

## 2012-08-29 MED ORDER — MISOPROSTOL 200 MCG PO TABS: 400.0000 ug | ORAL_TABLET | Freq: Once | ORAL | Status: AC

## 2012-08-29 MED ORDER — NALBUPHINE SYRINGE 5 MG/0.5 ML
10.0000 mg | INJECTION | Freq: Once | INTRAMUSCULAR | Status: AC
  Administered 2012-08-29: 10 mg via INTRAVENOUS
  Filled 2012-08-29: qty 1

## 2012-08-29 MED ORDER — SENNOSIDES-DOCUSATE SODIUM 8.6-50 MG PO TABS
2.0000 | ORAL_TABLET | Freq: Every day | ORAL | Status: DC
  Administered 2012-08-29: 2 via ORAL

## 2012-08-29 MED ORDER — IBUPROFEN 600 MG PO TABS
600.0000 mg | ORAL_TABLET | Freq: Four times a day (QID) | ORAL | Status: DC
  Administered 2012-08-29 – 2012-08-30 (×2): 600 mg via ORAL
  Filled 2012-08-29 (×2): qty 1

## 2012-08-29 MED ORDER — OXYTOCIN 40 UNITS IN LACTATED RINGERS INFUSION - SIMPLE MED
62.5000 mL/h | INTRAVENOUS | Status: DC
  Filled 2012-08-29: qty 1000

## 2012-08-29 MED ORDER — BENZOCAINE-MENTHOL 20-0.5 % EX AERO: 1.0000 "application " | INHALATION_SPRAY | CUTANEOUS | Status: DC | PRN

## 2012-08-29 MED ORDER — LACTATED RINGERS IV SOLN: 500.0000 mL | INTRAVENOUS | Status: DC | PRN

## 2012-08-29 MED ORDER — PRENATAL MULTIVITAMIN CH: 1.0000 | ORAL_TABLET | Freq: Every day | ORAL | Status: DC

## 2012-08-29 MED ORDER — OXYCODONE-ACETAMINOPHEN 5-325 MG PO TABS: 1.0000 | ORAL_TABLET | ORAL | Status: DC | PRN

## 2012-08-29 MED ORDER — CITRIC ACID-SODIUM CITRATE 334-500 MG/5ML PO SOLN: 30.0000 mL | ORAL | Status: DC | PRN

## 2012-08-29 MED ORDER — IBUPROFEN 600 MG PO TABS: 600.0000 mg | ORAL_TABLET | Freq: Four times a day (QID) | ORAL | Status: DC | PRN

## 2012-08-29 MED ORDER — DIPHENHYDRAMINE HCL 25 MG PO CAPS: 25.0000 mg | ORAL_CAPSULE | Freq: Four times a day (QID) | ORAL | Status: DC | PRN

## 2012-08-29 MED ORDER — OXYTOCIN 10 UNIT/ML IJ SOLN: 10.0000 [IU] | Freq: Once | INTRAMUSCULAR | Status: DC

## 2012-08-29 MED ORDER — ONDANSETRON HCL 4 MG PO TABS: 4.0000 mg | ORAL_TABLET | ORAL | Status: DC | PRN

## 2012-08-29 MED ORDER — MISOPROSTOL 200 MCG PO TABS
ORAL_TABLET | ORAL | Status: AC
  Administered 2012-08-29: 400 ug via VAGINAL
  Filled 2012-08-29: qty 2

## 2012-08-29 MED ORDER — ONDANSETRON HCL 4 MG/2ML IJ SOLN: 4.0000 mg | INTRAMUSCULAR | Status: DC | PRN

## 2012-08-29 MED ORDER — OXYTOCIN 10 UNIT/ML IJ SOLN
INTRAMUSCULAR | Status: AC
  Filled 2012-08-29: qty 1

## 2012-08-29 MED ORDER — WITCH HAZEL-GLYCERIN EX PADS: 1.0000 "application " | MEDICATED_PAD | CUTANEOUS | Status: DC | PRN

## 2012-08-29 MED ORDER — LIDOCAINE HCL (PF) 1 % IJ SOLN
30.0000 mL | INTRAMUSCULAR | Status: DC | PRN
  Filled 2012-08-29: qty 30

## 2012-08-29 MED ORDER — SIMETHICONE 80 MG PO CHEW: 80.0000 mg | CHEWABLE_TABLET | ORAL | Status: DC | PRN

## 2012-08-29 MED ORDER — ZOLPIDEM TARTRATE 5 MG PO TABS: 5.0000 mg | ORAL_TABLET | Freq: Every evening | ORAL | Status: DC | PRN

## 2012-08-29 MED ORDER — ACETAMINOPHEN 325 MG PO TABS: 650.0000 mg | ORAL_TABLET | ORAL | Status: DC | PRN

## 2012-08-29 MED ORDER — TETANUS-DIPHTH-ACELL PERTUSSIS 5-2.5-18.5 LF-MCG/0.5 IM SUSP: 0.5000 mL | Freq: Once | INTRAMUSCULAR | Status: DC

## 2012-08-29 MED ORDER — MISOPROSTOL 200 MCG PO TABS
800.0000 ug | ORAL_TABLET | Freq: Once | ORAL | Status: AC
  Administered 2012-08-29: 800 ug via VAGINAL
  Filled 2012-08-29: qty 4

## 2012-08-29 MED ORDER — OXYTOCIN BOLUS FROM INFUSION
500.0000 mL | INTRAVENOUS | Status: DC
  Administered 2012-08-29: 500 mL via INTRAVENOUS

## 2012-08-29 MED ORDER — OXYCODONE-ACETAMINOPHEN 5-325 MG PO TABS
1.0000 | ORAL_TABLET | ORAL | Status: DC | PRN
  Administered 2012-08-29: 1 via ORAL
  Filled 2012-08-29: qty 1

## 2012-08-29 MED ORDER — NALBUPHINE HCL 10 MG/ML IJ SOLN
10.0000 mg | Freq: Once | INTRAMUSCULAR | Status: DC
  Filled 2012-08-29: qty 1

## 2012-08-29 MED ORDER — DIBUCAINE 1 % RE OINT: 1.0000 "application " | TOPICAL_OINTMENT | RECTAL | Status: DC | PRN

## 2012-08-29 MED ORDER — OXYTOCIN 10 UNIT/ML IJ SOLN
10.0000 [IU] | Freq: Once | INTRAMUSCULAR | Status: AC
  Administered 2012-08-29: 10 [IU]

## 2012-08-29 MED ORDER — LACTATED RINGERS IV SOLN
INTRAVENOUS | Status: DC
  Administered 2012-08-29: 18:00:00 via INTRAVENOUS

## 2012-08-29 MED ORDER — ONDANSETRON HCL 4 MG/2ML IJ SOLN: 4.0000 mg | Freq: Four times a day (QID) | INTRAMUSCULAR | Status: DC | PRN

## 2012-08-29 NOTE — Patient Instructions (Signed)

## 2012-08-29 NOTE — Progress Notes (Signed)
Low AFI on [place] on 6/19. Suspected SROM. Will repeat US today

## 2012-08-29 NOTE — Progress Notes (Signed)
I had the opportunity to visit with patient when family was not present and she was receptive to support.  She and her family were already very attached to [employer].  This is her fifth child and FOB's second.  FOB is incarcerated--his family is present.  Tamiya may desire to have some time alone with her baby at some point following delivery.  I told her that we could help to make sure that she had some private time if she wished.  I offered grief support and grief education.  [employer] Pager, [phone] 1:50 PM

## 2012-08-29 NOTE — H&P (Signed)
Chart reviewed and agree with management and plan.  

## 2012-08-29 NOTE — Progress Notes (Signed)
I met Alethea in Ultrasound right after she heard that her baby had no heart beat.  I offered initial presence and support and helped to coordinate bringing family to where she was.  I will continue to follow up with her throughout the day, but please also page as needs arise.  [phone].  Agnes Lawrence Seraj Dunnam 11:46 AM   08/29/12 1100  Clinical Encounter Type  Visited With Patient and family together  Visit Type Initial

## 2012-08-29 NOTE — Progress Notes (Signed)
   Subjective: Called to room by RN due to pt report of increased pressure and bleeding.    Objective: BP 129/60  Pulse 76  Temp(Src) 98.7 F (37.1 C) (Oral)  Resp 18  Ht 5\' 4"  (1.626 m)  Wt 68.493 kg (151 lb)  BMI 25.91 kg/m2  LMP 04/12/2012      SVE:   Dilation: 7.5 Effacement (%): 90 Station: 0 Exam by:: Roney Marion, CNM  Labs: Lab Results  Component Value Date   WBC 9.9 08/29/2012   HGB 11.5* 08/29/2012   HCT 34.1* 08/29/2012   MCV 85.5 08/29/2012   PLT 270 08/29/2012    Assessment / Plan: Induction of Labor - Fetal Demise  Labor: Induction of Labor - Fetal Demise Pain Control:  Nubain Anticipated MOD:  NSVD  [facility] 08/29/2012, 6:27 PM

## 2012-08-29 NOTE — H&P (Signed)
Ana [name] is a 22 y.o. female presenting for induction of labor for fetal demise noted on ultrasound this AM after prenatal appt.  Pt seen in MAU on 08/25/12 and diagnosed with rupture of membranes and discharged home for expectant management.  History OB History   Grav Para Term Preterm Abortions TAB SAB Ect Mult Living   5 4 4       4      Past Medical History  Diagnosis Date  . Chlamydia Jan 2014  . Abnormal Pap smear    Past Surgical History  Procedure Laterality Date  . No past surgeries     Family History: family history includes Hypertension in her maternal grandmother and mother. Social History:  reports that she quit smoking about 4 months ago. She has never used smokeless tobacco. She reports that she uses illicit drugs (Marijuana). She reports that she does not drink alcohol.   Review of Systems  Gastrointestinal: Positive for abdominal pain (cramping).  All other systems reviewed and are negative.     Blood pressure 126/74, pulse 82, temperature 98.7 F (37.1 C), temperature source Oral, resp. rate 18, height 5\' 4"  (1.626 m), weight 68.493 kg (151 lb), last menstrual period 04/12/2012. Exam Physical Exam  Constitutional: She is oriented to person, place, and time. She appears well-developed and well-nourished. No distress.  HENT:  Head: Normocephalic.  Neck: Normal range of motion. Neck supple.  Cardiovascular: Normal rate, regular rhythm and normal heart sounds.   Respiratory: Effort normal and breath sounds normal.  GI: Soft. There is no tenderness.  Genitourinary: No bleeding around the vagina.  Musculoskeletal: Normal range of motion. She exhibits no edema.  Neurological: She is alert and oriented to person, place, and time.  Skin: Skin is warm and dry.    Prenatal labs: ABO, Rh: O/POS/-- (05/13 0841) Antibody: NEG (05/13 0841) Rubella: 4.78 (05/13 0841) RPR: NON REAC (05/13 0841)  HBsAg: NEGATIVE (05/13 0841)  HIV: NON REACTIVE (05/13 0841)  GBS:      Assessment/Plan: 19.6 wk Fetal Demise  Plan: Admit to [employer] Cytotec 800 mcg placed vaginally Provided emotional support to patient and family    [facility] 08/29/2012, 12:20 PM

## 2012-08-29 NOTE — Progress Notes (Signed)
08/29/12 1500  Clinical Encounter Type  Visited With Patient and family together;Health care provider (Pt, RN Clydie Braun)  Visit Type Follow-up  Referral From Chaplain  Spiritual Encounters  Spiritual Needs Emotional  Stress Factors  Patient Stress Factors Loss of control;Major life changes   Familiar with Maziah from a previous visit to MAU, when she was being tested and examined for suspected PROM.  Attempted follow-up visit to offer emotional support, but pt was not up to a visit.  We plan for me to check in again in the morning.  Please page as support needed:  [phone].  Thank you.  [address] [place], [place] [phone]

## 2012-08-29 NOTE — Progress Notes (Addendum)
   Subjective: Reports increased pain.  Objective: BP 129/60  Pulse 76  Temp(Src) 98.7 F (37.1 C) (Oral)  Resp 18  Ht 5\' 4"  (1.626 m)  Wt 68.493 kg (151 lb)  BMI 25.91 kg/m2  LMP 04/12/2012    SVE:   Dilation: 1.5 Effacement (%): 50 Station: -3 Exam by:: Roney Marion, CNM  Labs: Lab Results  Component Value Date   WBC 9.9 08/29/2012   HGB 11.5* 08/29/2012   HCT 34.1* 08/29/2012   MCV 85.5 08/29/2012   PLT 270 08/29/2012     Assessment / Plan:  Induction of Labor - Fetal Demise at 19.6 wks  Labor: Induction of Labor - Fetal Demise at 19.6 wks  Pain Control: Order IV Nubain.  Anticipated MOD: NSVD   Placed 400 mcg in vagina.     [facility] 08/29/2012, 5:35 PM

## 2012-08-29 NOTE — Progress Notes (Signed)
Pulse: 82

## 2012-08-29 NOTE — Progress Notes (Signed)
Pt is an IOL for IUFD at 19 wks.  Subjective: Pt wants to get this going "faster".  Feeling some cramping.  Objective: BP 126/74  Pulse 82  Temp(Src) 98.7 F (37.1 C) (Oral)  Resp 18  Ht 5\' 4"  (1.626 m)  Wt 68.493 kg (151 lb)  BMI 25.91 kg/m2  LMP 04/12/2012      SVE:   Dilation: Closed Effacement (%): 50 Station: -3 Exam by:: Roney Marion, CNM  Labs: Lab Results  Component Value Date   WBC 9.9 08/29/2012   HGB 11.5* 08/29/2012   HCT 34.1* 08/29/2012   MCV 85.5 08/29/2012   PLT 270 08/29/2012    Assessment / Plan: Induction of Labor - Fetal Demise at 19.6 wks  Labor: Induction of Labor - Fetal Demise at 19.6 wks Pain Control:  Labor support without medications Anticipated MOD:  NSVD  Explained to pt that IOL is a process and that it will take some time.  Place another round of cytotec at 1600.  [facility] 08/29/2012, 3:38 PM

## 2012-08-29 NOTE — Progress Notes (Addendum)
Roney Marion, CNM notified pt admitted and ready to get labor started. Per CNM okay for pt. to have saline lock and no monitoring (toco) at this time. Will continue to monitor.

## 2012-08-29 NOTE — MAU Provider Note (Signed)
See my note Attestation of Attending Supervision of Advanced Practitioner (CNM/NP): Evaluation and management procedures were performed by the Advanced Practitioner under my supervision and collaboration.  I have reviewed the Advanced Practitioner's note and chart, and I agree with the management and plan.  Jovaughn Wojtaszek 08/29/2012 12:03 PM

## 2012-08-29 NOTE — Progress Notes (Signed)
Patient scheduled to be worked in for an U/S today at 915 am.

## 2012-08-30 DIAGNOSIS — O4290 Premature rupture of membranes, unspecified as to length of time between rupture and onset of labor, unspecified weeks of gestation: Secondary | ICD-10-CM | POA: Diagnosis present

## 2012-08-30 DIAGNOSIS — IMO0002 Reserved for concepts with insufficient information to code with codable children: Secondary | ICD-10-CM | POA: Diagnosis present

## 2012-08-30 MED ORDER — IBUPROFEN 600 MG PO TABS: 600.0000 mg | ORAL_TABLET | Freq: Four times a day (QID) | ORAL | Status: DC

## 2012-08-30 NOTE — Progress Notes (Signed)
08/30/12 0900  Clinical Encounter Type  Visited With Patient;Health care provider Marney Setting, RN)  Visit Type Follow-up;Spiritual support;Social support  Spiritual Encounters  Spiritual Needs Emotional;Grief support (developing self-care plan)  Stress Factors  Patient Stress Factors (fetal loss)   Followed up with Pernie before her discharge to check in about her emotional needs and self-care plan.  She is aware of resources in her [employer] and looks forward to a couple of weeks of being present and having fun with her children before returning to work (normalcy and routine at [employer], where, per pt, she gets to smile with people and has good coworkers).  From [facility], she is going to her grandmother's house, where she will be with her children and receive support from family.    [address] [place], [place] [phone]

## 2012-08-30 NOTE — Discharge Summary (Signed)
Obstetric Discharge Summary Reason for Admission: IUFD @ 19 wk 6 days Prenatal Procedures: none Intrapartum Procedures: spontaneous vaginal delivery and IOL with cytotec Postpartum Procedures: none Complications-Operative and Postpartum: none  Pt. Noted to have decreased fluid on anatomy u/s one week ago.  Presumed to be PROM. Sent home with precautions.  Returned to [facility] on day of admission and no FHR noted.  Fetal death confirmed by formal U/S.  Pt. Admitted to L & D and had cytotec placed.  Want on to deliver non-viable fetus and placenta.  Stayed overnight and was discharged the next day.  Hemoglobin  Date Value Range Status  08/29/2012 11.5* 12.0 - 15.0 g/dL Final     HCT  Date Value Range Status  08/29/2012 34.1* 36.0 - 46.0 % Final    Physical Exam:  General: alert, cooperative and appears stated age 22: appropriate Uterine Fundus: firm DVT Evaluation: No evidence of DVT seen on physical exam.  Discharge Diagnoses: Spontaneous abortion and Fetal demise  Discharge Information: Date: 08/30/2012 Activity: pelvic rest Diet: routine Medications: PNV, Ibuprofen and Iron Condition: stable Instructions: Out of work x 2 wks, pelvic rest x 2 wks Discharge to: home  Follow-up Information   Follow up with [facility] In 2 weeks. (They will call you with appointment)    Contact information:   [address] [place] [address] [phone]       Ana [name] S 08/30/2012, 7:25 AM

## 2012-09-02 ENCOUNTER — Encounter: Payer: Self-pay | Admitting: Family Medicine

## 2012-09-05 ENCOUNTER — Encounter: Payer: Self-pay | Admitting: Family Medicine

## 2012-09-05 ENCOUNTER — Encounter (HOSPITAL_COMMUNITY): Payer: Self-pay | Admitting: *Deleted

## 2012-09-05 ENCOUNTER — Encounter: Payer: Self-pay | Admitting: *Deleted

## 2012-09-06 DEATH — deceased

## 2012-09-12 ENCOUNTER — Encounter: Payer: Self-pay | Admitting: Family Medicine

## 2012-09-12 ENCOUNTER — Ambulatory Visit (INDEPENDENT_AMBULATORY_CARE_PROVIDER_SITE_OTHER): Payer: Medicaid Other | Admitting: Family Medicine

## 2012-09-12 NOTE — Patient Instructions (Addendum)
Postpartum Depression and Baby Blues The postpartum period begins right after the birth of a baby. During this time, there is often a great amount of joy and excitement. It is also a time of considerable changes in the life of the parent(s). Regardless of how many times a mother gives birth, each child brings new challenges and dynamics to the family. It is not unusual to have feelings of excitement accompanied by confusing shifts in moods, emotions, and thoughts. All mothers are at risk of developing postpartum depression or the "baby blues." These mood changes can occur right after giving birth, or they may occur many months after giving birth. The baby blues or postpartum depression can be mild or severe. Additionally, postpartum depression can resolve rather quickly, or it can be a long-term condition. CAUSES Elevated hormones and their rapid decline are thought to be a main cause of postpartum depression and the baby blues. There are a number of hormones that radically change during and after pregnancy. Estrogen and progesterone usually decrease immediately after delivering your baby. The level of thyroid hormone and various cortisol steroids also rapidly drop. Other factors that play a major role in these changes include major life events and genetics.  RISK FACTORS If you have any of the following risks for the baby blues or postpartum depression, know what symptoms to watch out for during the postpartum period. Risk factors that may increase the likelihood of getting the baby blues or postpartum depression include:  Havinga personal or family history of depression.  Having depression while being pregnant.  Having premenstrual or oral contraceptive-associated mood issues.  Having exceptional life stress.  Having marital conflict.  Lacking a social support network.  Having a baby with special needs.  Having health problems such as diabetes. SYMPTOMS Baby blues symptoms include:  Brief  fluctuations in mood, such as going from extreme happiness to sadness.  Decreased concentration.  Difficulty sleeping.  Crying spells, tearfulness.  Irritability.  Anxiety. Postpartum depression symptoms typically begin within the first month after giving birth. These symptoms include:  Difficulty sleeping or excessive sleepiness.  Marked weight loss.  Agitation.  Feelings of worthlessness.  Lack of interest in activity or food. Postpartum psychosis is a very concerning condition and can be dangerous. Fortunately, it is rare. Displaying any of the following symptoms is cause for immediate medical attention. Postpartum psychosis symptoms include:  Hallucinations and delusions.  Bizarre or disorganized behavior.  Confusion or disorientation. DIAGNOSIS  A diagnosis is made by an evaluation of your symptoms. There are no medical or lab tests that lead to a diagnosis, but there are various questionnaires that a caregiver may use to identify those with the baby blues, postpartum depression, or psychosis. Often times, a screening tool called the Edinburgh Postnatal Depression Scale is used to diagnose depression in the postpartum period.  TREATMENT The baby blues usually goes away on its own in 1 to 2 weeks. Social support is often all that is needed. You should be encouraged to get adequate sleep and rest. Occasionally, you may be given medicines to help you sleep.  Postpartum depression requires treatment as it can last several months or longer if it is not treated. Treatment may include individual or group therapy, medicine, or both to address any social, physiological, and psychological factors that may play a role in the depression. Regular exercise, a healthy diet, rest, and social support may also be strongly recommended.  Postpartum psychosis is more serious and needs treatment right away. Hospitalization is   often needed. HOME CARE INSTRUCTIONS  Get as much rest as you can. Nap  when the baby sleeps.  Exercise regularly. Some women find yoga and walking to be beneficial.  Eat a balanced and nourishing diet.  Do little things that you enjoy. Have a cup of tea, take a bubble bath, read your favorite magazine, or listen to your favorite music.  Avoid alcohol.  Ask for help with household chores, cooking, grocery shopping, or running errands as needed. Do not try to do everything.  Talk to people close to you about how you are feeling. Get support from your partner, family members, friends, or other new moms.  Try to stay positive in how you think. Think about the things you are grateful for.  Do not spend a lot of time alone.  Only take medicine as directed by your caregiver.  Keep all your postpartum appointments.  Let your caregiver know if you have any concerns. SEEK MEDICAL CARE IF: You are having a reaction or problems with your medicine. SEEK IMMEDIATE MEDICAL CARE IF:  You have suicidal feelings.  You feel you may harm the baby or someone else. Document Released: 11/28/2003 Document Revised: 05/18/2011 Document Reviewed: 12/30/2010 [facility] Patient Information 2014 [place], [place].  Contraception Choices Contraception (birth control) is the use of any methods or devices to prevent pregnancy. Below are some methods to help avoid pregnancy. HORMONAL METHODS   Contraceptive implant. This is a thin, plastic tube containing progesterone hormone. It does not contain estrogen hormone. Your caregiver inserts the tube in the inner part of the upper arm. The tube can remain in place for up to 3 years. After 3 years, the implant must be removed. The implant prevents the ovaries from releasing an egg (ovulation), thickens the cervical mucus which prevents sperm from entering the uterus, and thins the lining of the inside of the uterus.  Progesterone-only injections. These injections are given every 3 months by your caregiver to prevent pregnancy. This  synthetic progesterone hormone stops the ovaries from releasing eggs. It also thickens cervical mucus and changes the uterine lining. This makes it harder for sperm to survive in the uterus.  Birth control pills. These pills contain estrogen and progesterone hormone. They work by stopping the egg from forming in the ovary (ovulation). Birth control pills are prescribed by a caregiver.Birth control pills can also be used to treat heavy periods.  Minipill. This type of birth control pill contains only the progesterone hormone. They are taken every day of each month and must be prescribed by your caregiver.  Birth control patch. The patch contains hormones similar to those in birth control pills. It must be changed once a week and is prescribed by a caregiver.  Vaginal ring. The ring contains hormones similar to those in birth control pills. It is left in the vagina for 3 weeks, removed for 1 week, and then a new one is put back in place. The patient must be comfortable inserting and removing the ring from the vagina.A caregiver's prescription is necessary.  Emergency contraception. Emergency contraceptives prevent pregnancy after unprotected sexual intercourse. This pill can be taken right after sex or up to 5 days after unprotected sex. It is most effective the sooner you take the pills after having sexual intercourse. Emergency contraceptive pills are available without a prescription. Check with your pharmacist. Do not use emergency contraception as your only form of birth control. BARRIER METHODS   Female condom. This is a thin sheath (latex or rubber)  that is worn over the penis during sexual intercourse. It can be used with spermicide to increase effectiveness.  Female condom. This is a soft, loose-fitting sheath that is put into the vagina before sexual intercourse.  Diaphragm. This is a soft, latex, dome-shaped barrier that must be fitted by a caregiver. It is inserted into the vagina, along  with a spermicidal jelly. It is inserted before intercourse. The diaphragm should be left in the vagina for 6 to 8 hours after intercourse.  Cervical cap. This is a round, soft, latex or plastic cup that fits over the cervix and must be fitted by a caregiver. The cap can be left in place for up to 48 hours after intercourse.  Sponge. This is a soft, circular piece of polyurethane foam. The sponge has spermicide in it. It is inserted into the vagina after wetting it and before sexual intercourse.  Spermicides. These are chemicals that kill or block sperm from entering the cervix and uterus. They come in the form of creams, jellies, suppositories, foam, or tablets. They do not require a prescription. They are inserted into the vagina with an applicator before having sexual intercourse. The process must be repeated every time you have sexual intercourse. INTRAUTERINE CONTRACEPTION  Intrauterine device (IUD). This is a T-shaped device that is put in a woman's uterus during a menstrual period to prevent pregnancy. There are 2 types:  Copper IUD. This type of IUD is wrapped in copper wire and is placed inside the uterus. Copper makes the uterus and fallopian tubes produce a fluid that kills sperm. It can stay in place for 10 years.  Hormone IUD. This type of IUD contains the hormone progestin (synthetic progesterone). The hormone thickens the cervical mucus and prevents sperm from entering the uterus, and it also thins the uterine lining to prevent implantation of a fertilized egg. The hormone can weaken or kill the sperm that get into the uterus. It can stay in place for 5 years. PERMANENT METHODS OF CONTRACEPTION  Female tubal ligation. This is when the woman's fallopian tubes are surgically sealed, tied, or blocked to prevent the egg from traveling to the uterus.  Female sterilization. This is when the female has the tubes that carry sperm tied off (vasectomy).This blocks sperm from entering the vagina  during sexual intercourse. After the procedure, the man can still ejaculate fluid (semen). NATURAL PLANNING METHODS  Natural family planning. This is not having sexual intercourse or using a barrier method (condom, diaphragm, cervical cap) on days the woman could become pregnant.  Calendar method. This is keeping track of the length of each menstrual cycle and identifying when you are fertile.  Ovulation method. This is avoiding sexual intercourse during ovulation.  Symptothermal method. This is avoiding sexual intercourse during ovulation, using a thermometer and ovulation symptoms.  Post-ovulation method. This is timing sexual intercourse after you have ovulated. Regardless of which type or method of contraception you choose, it is important that you use condoms to protect against the transmission of sexually transmitted diseases (STDs). Talk with your caregiver about which form of contraception is most appropriate for you. Document Released: 02/23/2005 Document Revised: 05/18/2011 Document Reviewed: 07/02/2010 [facility] Patient Information 2014 [place], [place].

## 2012-09-12 NOTE — Progress Notes (Signed)
Patient ID: Ana [name], female   DOB: 03-17-1990, 22 y.o.   MRN: [id number]  Pt is here for follow-up of SVD on 08/29/12 after IOL for IUFD at [redacted]w[redacted]d gestation. She denies vaginal bleeding or pain at this time and has not resumed sexual activity.  Not using any contraception at this time as no current sexual partner. FOB is living elsewhere at this time. No vaginal discharge or odor. No change in urination or bowel habits.  Denies suicidal ideation, depressed mood, tearfulness, feelings of guilt or hopelessness. States she has adequate support at home.   Filed Vitals:   09/12/12 1451  BP: 118/71  Pulse: 77  Temp: 99.1 F (37.3 C)    GEN:  WNWD, no distress HEENT:  NCAT, EOMI, conjunctiva clear NECK:  Supple, non-tender, no thyromegaly, trachea midline CV: RRR, no murmur RESP:  CTAB ABD:  Soft, non-tender, no guarding or rebound, normal bowel sounds EXTREM:  Warm, well perfused, no edema or tenderness NEURO:  Alert, oriented, no focal deficits GU:  Normal external genitalia and vagina - no lacerations visible, no bleeding, no discharge.   A/P 22 y.o. [id number] s/p SVD after induction for IUFD at 19 weeks.  - No evidence of depression - discussed signs/symptoms of depression - 19 week IUFD - autopsy results pending, placental pathology normal - Contraception - none, handout on choices given - F/U 1 year  Napoleon Form, MD

## 2012-09-28 ENCOUNTER — Ambulatory Visit: Payer: Self-pay | Admitting: Obstetrics and Gynecology

## 2012-09-30 ENCOUNTER — Encounter: Payer: Self-pay | Admitting: *Deleted

## 2012-11-08 ENCOUNTER — Encounter: Payer: Self-pay | Admitting: *Deleted

## 2012-12-02 ENCOUNTER — Other Ambulatory Visit (HOSPITAL_COMMUNITY)
Admission: RE | Admit: 2012-12-02 | Discharge: 2012-12-02 | Disposition: A | Discharge status: Home / Self Care | Payer: Medicaid Other | Source: Ambulatory Visit | Attending: Emergency Medicine | Admitting: Emergency Medicine

## 2012-12-02 ENCOUNTER — Encounter (HOSPITAL_COMMUNITY): Payer: Self-pay | Admitting: Emergency Medicine

## 2012-12-02 ENCOUNTER — Emergency Department (INDEPENDENT_AMBULATORY_CARE_PROVIDER_SITE_OTHER): Payer: Medicaid Other

## 2012-12-02 ENCOUNTER — Emergency Department (HOSPITAL_COMMUNITY)
Admission: EM | Admit: 2012-12-02 | Discharge: 2012-12-02 | Disposition: A | Discharge status: Home / Self Care | Payer: Medicaid Other | Source: Home / Self Care | Attending: Emergency Medicine | Admitting: Emergency Medicine

## 2012-12-02 DIAGNOSIS — K3189 Other diseases of stomach and duodenum: Secondary | ICD-10-CM

## 2012-12-02 DIAGNOSIS — B9789 Other viral agents as the cause of diseases classified elsewhere: Secondary | ICD-10-CM

## 2012-12-02 DIAGNOSIS — R062 Wheezing: Secondary | ICD-10-CM

## 2012-12-02 DIAGNOSIS — Z113 Encounter for screening for infections with a predominantly sexual mode of transmission: Secondary | ICD-10-CM | POA: Insufficient documentation

## 2012-12-02 DIAGNOSIS — B9689 Other specified bacterial agents as the cause of diseases classified elsewhere: Secondary | ICD-10-CM

## 2012-12-02 DIAGNOSIS — N760 Acute vaginitis: Secondary | ICD-10-CM

## 2012-12-02 DIAGNOSIS — A499 Bacterial infection, unspecified: Secondary | ICD-10-CM

## 2012-12-02 DIAGNOSIS — B349 Viral infection, unspecified: Secondary | ICD-10-CM

## 2012-12-02 DIAGNOSIS — R1013 Epigastric pain: Secondary | ICD-10-CM

## 2012-12-02 LAB — POCT URINALYSIS DIP (DEVICE)
Bilirubin Urine: NEGATIVE
Glucose, UA: NEGATIVE mg/dL
Hgb urine dipstick: NEGATIVE
Ketones, ur: NEGATIVE mg/dL
Leukocytes, UA: NEGATIVE
Nitrite: NEGATIVE
Specific Gravity, Urine: >=1.03 (ref 1.005–1.030)

## 2012-12-02 LAB — POCT RAPID STREP A: Streptococcus, Group A Screen (Direct): NEGATIVE

## 2012-12-02 LAB — RPR: RPR Ser Ql: NONREACTIVE

## 2012-12-02 MED ORDER — GI COCKTAIL ~~LOC~~
30.0000 mL | Freq: Once | ORAL | Status: AC
  Administered 2012-12-02: 30 mL via ORAL

## 2012-12-02 MED ORDER — IBUPROFEN 800 MG PO TABS
ORAL_TABLET | ORAL | Status: AC
  Filled 2012-12-02: qty 1

## 2012-12-02 MED ORDER — IBUPROFEN 800 MG PO TABS
800.0000 mg | ORAL_TABLET | Freq: Once | ORAL | Status: AC
  Administered 2012-12-02: 800 mg via ORAL

## 2012-12-02 MED ORDER — RANITIDINE HCL 150 MG PO CAPS: 150.0000 mg | ORAL_CAPSULE | Freq: Two times a day (BID) | ORAL | Status: DC

## 2012-12-02 MED ORDER — GI COCKTAIL ~~LOC~~
ORAL | Status: AC
  Filled 2012-12-02: qty 30

## 2012-12-02 MED ORDER — METRONIDAZOLE 500 MG PO TABS: 500.0000 mg | ORAL_TABLET | Freq: Two times a day (BID) | ORAL | Status: DC

## 2012-12-02 MED ORDER — HYDROCODONE-ACETAMINOPHEN 5-325 MG PO TABS: ORAL_TABLET | ORAL | Status: DC

## 2012-12-02 MED ORDER — ALBUTEROL SULFATE HFA 108 (90 BASE) MCG/ACT IN AERS: 2.0000 | INHALATION_SPRAY | RESPIRATORY_TRACT | Status: DC | PRN

## 2012-12-02 NOTE — ED Notes (Signed)
Pt complains of body aches and wheezing. Pt also reports having had unprotected sex 2 weeks ago and believes she may have been exposed to STDs.

## 2012-12-02 NOTE — ED Provider Notes (Signed)
Chief Complaint:   Chief Complaint  Patient presents with  . Generalized Body Aches  . Exposure to STD    History of Present Illness:   Ana [name] is a 22 year old female who has had a history since yesterday of aching, wheezing, and loose stools. She also wants to be checked for STDs.  1. Myalgias: She has had a two-day history of aching from her neck on down to her waist. This includes her left hip. She notes aching in the muscles, pain with movement, but no swelling or weakness. It involves the neck, anterior chest, upper abdomen, and upper and mid back. She also has wheezing, chest tightness, postnasal drip, heartburn, and some loose stools. She denies any fever, chills, nasal congestion, rhinorrhea, sore throat, coughing, or shortness of breath. She does not have any nausea, vomiting, or blood in the stool. She denies any urinary symptoms.  2. STD testing: She wants to be tested for gonorrhea and chlamydia. She's had some vaginal discharge but denies any pelvic pain or urinary symptoms. Her last menstrual period was August 31. She is sexually active with consistent use of condoms. She denies any prior history of STDs.  Review of Systems:  Other than noted above, the patient denies any of the following symptoms. Systemic:  No fever, chills, sweats, fatigue, myalgias, headache, or anorexia. Eye:  No redness, pain or drainage. ENT:  No earache, nasal congestion, rhinorrhea, sinus pressure, or sore throat. Lungs:  No cough, sputum production, wheezing, shortness of breath.  Cardiovascular:  No chest pain, palpitations, or syncope. GI:  No nausea, vomiting, abdominal pain or diarrhea. GU:  No dysuria, frequency, or hematuria. Skin:  No rash or pruritis.  PMFSH:  Past medical history, family history, social history, meds, and allergies were reviewed.   Physical Exam:   Vital signs:  BP 117/77  Pulse 83  Temp(Src) 98.2 F (36.8 C) (Oral)  SpO2 100%  LMP 11/06/2012 General:  Alert, in  no distress. Eye:  PERRL, full EOMs.  Lids and conjunctivas were normal. ENT:  TMs and canals were normal, without erythema or inflammation.  Nasal mucosa was clear and uncongested, without drainage.  Mucous membranes were moist.  Pharynx was clear, without exudate or drainage.  There were no oral ulcerations or lesions. Neck:  Supple, no adenopathy, tenderness or mass. Thyroid was normal. Lungs:  No respiratory distress.  She had some inspiratory wheezes heard anteriorly over the trachea and bronchial tubes.  Otherwise, breath sounds were clear and equal bilaterally. Heart:  Regular rhythm, without gallops, murmers or rubs. Abdomen:  Soft, flat, and non-tender to palpation.  No hepatosplenomagaly or mass. Pelvic exam: Normal external genitalia, vaginal and cervical mucosa were normal. She has a scant whitish discharge. No pain on cervical motion. Uterus was normal in size and shape and nontender. No adnexal masses or tenderness. DNA probes for gonorrhea, Chlamydia, Trichomonas, Gardnerella, Candida were obtained. Skin:  Clear, warm, and dry, without rash or lesions.  Labs:   Results for orders placed during the hospital encounter of 12/02/12  HIV ANTIBODY (ROUTINE TESTING)      Result Value Range   HIV NON REACTIVE  NON REACTIVE  RPR      Result Value Range   RPR NON REACTIVE  NON REACTIVE  POCT URINALYSIS DIP (DEVICE)      Result Value Range   Glucose, UA NEGATIVE  NEGATIVE mg/dL   Bilirubin Urine NEGATIVE  NEGATIVE   Ketones, ur NEGATIVE  NEGATIVE mg/dL   Specific Gravity,  Urine >=1.030  1.005 - 1.030   Hgb urine dipstick NEGATIVE  NEGATIVE   pH 6.5  5.0 - 8.0   Protein, ur NEGATIVE  NEGATIVE mg/dL   Urobilinogen, UA 0.2  0.0 - 1.0 mg/dL   Nitrite NEGATIVE  NEGATIVE   Leukocytes, UA NEGATIVE  NEGATIVE  POCT PREGNANCY, URINE      Result Value Range   Preg Test, Ur NEGATIVE  NEGATIVE  POCT RAPID STREP A ([facility]RG CARE ONLY)      Result Value Range   Streptococcus, Group A Screen  (Direct) NEGATIVE  NEGATIVE     Radiology:  Dg Chest 2 View  12/02/2012   CLINICAL DATA:  Wheezing and chest pain.  EXAM: CHEST  2 VIEW  COMPARISON:  None.  FINDINGS: The heart size is normal. The lungs are clear. The visualized soft tissues and bony thorax are unremarkable.  IMPRESSION: No active cardiopulmonary disease.   Electronically Signed   By: Gennette Pac   On: 12/02/2012 14:17    Course in Urgent Care Center:   She was given GI cocktail for the heartburn and ibuprofen for the aches. She experienced relief of her symptoms thereafter.  Assessment:  The primary encounter diagnosis was Bacterial vaginosis. Diagnoses of Viral syndrome, Wheezing, and Dyspepsia were also pertinent to this visit.  Appeared to have a viral syndrome with myalgias, wheezing, chest tightness, postnasal drip, and diarrhea. This may cause a flare up of her reflux. There is no definite evidence of an STD today, but results of tests are pending.  Plan:   1.  Meds:  The following meds were prescribed:   Discharge Medication List as of 12/02/2012  2:45 PM    START taking these medications   Details  albuterol (PROVENTIL HFA;VENTOLIN HFA) 108 (90 BASE) MCG/ACT inhaler Inhale 2 puffs into the lungs every 4 (four) hours as needed for wheezing., Starting 12/02/2012, Until Discontinued, Normal    HYDROcodone-acetaminophen (NORCO/VICODIN) 5-325 MG per tablet 1 to 2 tabs every 4 to 6 hours as needed for pain., Print    metroNIDAZOLE (FLAGYL) 500 MG tablet Take 1 tablet (500 mg total) by mouth 2 (two) times daily., Starting 12/02/2012, Until Discontinued, Normal    ranitidine (ZANTAC) 150 MG capsule Take 1 capsule (150 mg total) by mouth 2 (two) times daily., Starting 12/02/2012, Until Discontinued, Normal        2.  Patient Education/Counseling:  The patient was given appropriate handouts, self care instructions, and instructed in symptomatic relief.  Suggested rest, fluids, and a reflux diet.  3.  Follow up:  The  patient was told to follow up if no better in 3 to 4 days, if becoming worse in any way, and given some red flag symptoms such as fever, difficulty breathing, or increasing pain which would prompt immediate return.  Follow up here if necessary.         Reuben Likes, MD 12/02/12 [phone]

## 2012-12-04 LAB — CULTURE, GROUP A STREP

## 2012-12-07 ENCOUNTER — Telehealth (HOSPITAL_COMMUNITY): Payer: Self-pay | Admitting: *Deleted

## 2012-12-07 NOTE — ED Notes (Signed)
Throat culture: No beta hemolytic strep isolated. Ana [name] 12/07/2012

## 2012-12-07 NOTE — ED Notes (Signed)
GC pos., Chlamydia neg., Affirm: all neg., HIV/RPR non-reactive.  9/30 Message sent to Dr. Lorenz Coaster and order obtained for Rocephin and Zithromax. 10/1 I called pt. And left a message to call.  Pt. Called right back.  Pt. verified x 2 and given results.  Pt. Told she needs to come back for a shot of Rocephin and Zithromax pills.  She said she can't come until after work tomorrow.  Pt. instructed to notify her partner, no sex for 1 week after the medication and to practice safe sex. Pt. told she should get her HIV rechecked in 6 mos. at the [facility] Dept. STD clinic, by appointment.  Pt. voiced understanding. Vassie Moselle 12/07/2012

## 2012-12-09 ENCOUNTER — Emergency Department (HOSPITAL_COMMUNITY)
Admission: EM | Admit: 2012-12-09 | Discharge: 2012-12-09 | Disposition: A | Discharge status: Home / Self Care | Payer: Medicaid Other | Source: Home / Self Care

## 2012-12-09 ENCOUNTER — Encounter (HOSPITAL_COMMUNITY): Payer: Self-pay

## 2012-12-09 MED ORDER — LIDOCAINE HCL (PF) 1 % IJ SOLN
INTRAMUSCULAR | Status: AC
  Filled 2012-12-09: qty 5

## 2012-12-09 MED ORDER — CEFTRIAXONE SODIUM 250 MG IJ SOLR
250.0000 mg | Freq: Once | INTRAMUSCULAR | Status: AC
  Administered 2012-12-09: 250 mg via INTRAMUSCULAR

## 2012-12-09 MED ORDER — AZITHROMYCIN 250 MG PO TABS
1000.0000 mg | ORAL_TABLET | Freq: Once | ORAL | Status: AC
  Administered 2012-12-09: 1000 mg via ORAL

## 2012-12-09 MED ORDER — AZITHROMYCIN 250 MG PO TABS
ORAL_TABLET | ORAL | Status: AC
  Filled 2012-12-09: qty 4

## 2012-12-09 MED ORDER — CEFTRIAXONE SODIUM 250 MG IJ SOLR
INTRAMUSCULAR | Status: AC
  Filled 2012-12-09: qty 250

## 2012-12-09 NOTE — ED Notes (Addendum)
Here as call back patient from lab reports; to receive treatment for positive STD findings  (positive for GC); has been advised to practice safer sex, and have her partner treated as well; HIV/RPR follow up at [facility]

## 2012-12-09 NOTE — ED Notes (Signed)
Pt. here for treatment. DHHS form completed and faxed to the [facility] Department. Vassie Moselle 12/09/2012

## 2013-05-08 ENCOUNTER — Inpatient Hospital Stay (HOSPITAL_COMMUNITY)
Admission: AD | Admit: 2013-05-08 | Discharge: 2013-05-08 | Discharge status: Against Medical Advice | Payer: Medicaid Other | Source: Ambulatory Visit | Attending: Obstetrics & Gynecology | Admitting: Obstetrics & Gynecology

## 2013-05-08 DIAGNOSIS — R109 Unspecified abdominal pain: Secondary | ICD-10-CM | POA: Insufficient documentation

## 2013-05-08 DIAGNOSIS — N949 Unspecified condition associated with female genital organs and menstrual cycle: Secondary | ICD-10-CM | POA: Insufficient documentation

## 2013-05-08 DIAGNOSIS — R300 Dysuria: Secondary | ICD-10-CM | POA: Insufficient documentation

## 2013-05-08 LAB — URINALYSIS, ROUTINE W REFLEX MICROSCOPIC
Bilirubin Urine: NEGATIVE
Glucose, UA: NEGATIVE mg/dL
Ketones, ur: NEGATIVE mg/dL
LEUKOCYTES UA: NEGATIVE
Nitrite: NEGATIVE
PROTEIN: NEGATIVE mg/dL
SPECIFIC GRAVITY, URINE: 1.02 (ref 1.005–1.030)
Urobilinogen, UA: 0.2 mg/dL (ref 0.0–1.0)
pH: 6 (ref 5.0–8.0)

## 2013-05-08 LAB — URINE MICROSCOPIC-ADD ON

## 2013-05-08 LAB — POCT PREGNANCY, URINE: PREG TEST UR: NEGATIVE

## 2013-05-08 NOTE — MAU Note (Signed)
Patient states she has been having pain with urination for about one week. Now having pain that radiates from the right side of the abdomen that shoots down into the vagina. Has been on the left side. States she has a small boil on the the outside of the left labia. Denies nausea or vomiting, bleeding or discharge.

## 2013-05-12 ENCOUNTER — Encounter: Payer: Self-pay | Admitting: Medical

## 2013-05-12 ENCOUNTER — Ambulatory Visit (INDEPENDENT_AMBULATORY_CARE_PROVIDER_SITE_OTHER): Payer: Medicaid Other | Admitting: Medical

## 2013-05-12 VITALS — BP 129/79 | HR 77 | Ht 64.0 in | Wt 156.6 lb

## 2013-05-12 DIAGNOSIS — Z113 Encounter for screening for infections with a predominantly sexual mode of transmission: Secondary | ICD-10-CM

## 2013-05-12 DIAGNOSIS — R300 Dysuria: Secondary | ICD-10-CM

## 2013-05-12 DIAGNOSIS — N949 Unspecified condition associated with female genital organs and menstrual cycle: Secondary | ICD-10-CM

## 2013-05-12 LAB — RPR

## 2013-05-12 NOTE — Progress Notes (Signed)
Patient went to MAU on 3/2 for painful urination and pelvic pain. She had a urinalysis done and a upt. She left prior to seeing a provider. The symptoms have since resolved. She would like to be tested for stds.

## 2013-05-12 NOTE — Patient Instructions (Signed)
Sexually Transmitted Disease A sexually transmitted disease (STD) is a disease or infection that may be passed (transmitted) from person to person, usually during sexual activity. This may happen by way of saliva, semen, blood, vaginal mucus, or urine. Common STDs include:   Gonorrhea.   Chlamydia.   Syphilis.   HIV and AIDS.   Genital herpes.   Hepatitis B and C.   Trichomonas.   Human papillomavirus (HPV).   Pubic lice.   Scabies.  Mites.  Bacterial vaginosis. WHAT ARE CAUSES OF STDs? An STD may be caused by bacteria, a virus, or parasites. STDs are often transmitted during sexual activity if one person is infected. However, they may also be transmitted through nonsexual means. STDs may be transmitted after:   Sexual intercourse with an infected person.   Sharing sex toys with an infected person.   Sharing needles with an infected person or using unclean piercing or tattoo needles.  Having intimate contact with the genitals, mouth, or rectal areas of an infected person.   Exposure to infected fluids during birth. WHAT ARE THE SIGNS AND SYMPTOMS OF STDs? Different STDs have different symptoms. Some people may not have any symptoms. If symptoms are present, they may include:   Painful or bloody urination.   Pain in the pelvis, abdomen, vagina, anus, throat, or eyes.   Skin rash, itching, irritation, growths, sores (lesions), ulcerations, or warts in the genital or anal area.  Abnormal vaginal discharge with or without bad odor.   Penile discharge in men.   Fever.   Pain or bleeding during sexual intercourse.   Swollen glands in the groin area.   Yellow skin and eyes (jaundice). This is seen with hepatitis.   Swollen testicles.  Infertility.  Sores and blisters in the mouth. HOW ARE STDs DIAGNOSED? To make a diagnosis, your health care provider may:   Take a medical history.   Perform a physical exam.   Take a sample of any  discharge for examination.  Swab the throat, cervix, opening to the penis, rectum, or vagina for testing.  Test a sample of your first morning urine.   Perform blood tests.   Perform a Pap smear, if this applies.   Perform a colposcopy.   Perform a laparoscopy.  HOW ARE STDs TREATED? Treatment depends on the STD. Some STDs may be treated but not cured.   Chlamydia, gonorrhea, trichomonas, and syphilis can be cured with antibiotics.   Genital herpes, hepatitis, and HIV can be treated, but not cured, with prescribed medicines. The medicines lessen symptoms.   Genital warts from HPV can be treated with medicine or by freezing, burning (electrocautery), or surgery. Warts may come back.   HPV cannot be cured with medicine or surgery. However, abnormal areas may be removed from the cervix, vagina, or vulva.   If your diagnosis is confirmed, your recent sexual partners need treatment. This is true even if they are symptom-free or have a negative culture or evaluation. They should not have sex until their health care providers say it is OK. HOW CAN I REDUCE MY RISK OF GETTING AN STD?  Use latex condoms, dental dams, and water-soluble lubricants during sexual activity. Do not use petroleum jelly or oils.  Get vaccinated for HPV and hepatitis. If you have not received these vaccines in the past, talk to your health care provider about whether one or both might be right for you.   Avoid risky sex practices that can break the skin.  WHAT SHOULD   I DO IF I THINK I HAVE AN STD?  See your health care provider.   Inform all sexual partners. They should be tested and treated for any STDs.  Do not have sex until your health care provider says it is OK. WHEN SHOULD I GET HELP? Seek immediate medical care if:  You develop severe abdominal pain.  You are a man and notice swelling or pain in the testicles.  You are a woman and notice swelling or pain in your vagina. Document  Released: 05/16/2002 Document Revised: 12/14/2012 Document Reviewed: 09/13/2012 Exit[employer] Patient Information 2014 [employer].  

## 2013-05-12 NOTE — Progress Notes (Signed)
Patient ID: Ana Cabalyshai Joshi, female   DOB: [birth year], 23 y.o.   MRN: [id number]  History:  Ms. Ana [name] is a 23 y.o. [id number] who presents to clinic today for STD testing. Patient was having painful urination and pelvic pain last week when appointment was made. Patient denies UTI symptoms today. She denies fever, discharge, irregular bleeding or flank pain.   The following portions of the patient's history were reviewed and updated as appropriate: allergies, current medications, past family history, past medical history, past social history, past surgical history and problem list.  Review of Systems:  Pertinent items are noted in HPI.  Objective:  Physical Exam BP 129/79  Pulse 77  Ht 5\' 4"  (1.626 m)  Wt 156 lb 9.6 oz (71.033 kg)  BMI 26.87 kg/m2  LMP 05/12/2013 GENERAL: Well-developed, well-nourished female in no acute distress.  HEENT: Normocephalic, atraumatic.  LUNGS: Normal rate. Clear to auscultation bilaterally.  HEART: Regular rate and rhythm with no adventitious sounds.  ABDOMEN: Soft, nontender, nondistended. No organomegaly. Normal bowel sounds appreciated in all quadrants.  PELVIC: Normal external female genitalia. Vagina is pink and rugated.  Normal discharge. Small amount of blood noted in the vaginal vault. Normal cervix contour. Uterus is normal in size. No adnexal mass, mild tenderness to palpation on bimanual exam.  EXTREMITIES: No cyanosis, clubbing, or edema  Labs and Imaging Wet prep GC/Chlamydia HIV RPR Hep B Hep C  Assessment & Plan:  Assessment: STD testing  Plans: Patient will be contacted with any abnormal results [facility] PRN  Freddi StarrJulie N Ethier, PA-C 05/12/2013 8:32 AM

## 2013-05-13 LAB — HEPATITIS B SURFACE ANTIGEN: HEP B S AG: NEGATIVE

## 2013-05-13 LAB — WET PREP, GENITAL
Trich, Wet Prep: NONE SEEN
WBC, Wet Prep HPF POC: NONE SEEN
YEAST WET PREP: NONE SEEN

## 2013-05-13 LAB — HIV ANTIBODY (ROUTINE TESTING W REFLEX): HIV: NONREACTIVE

## 2013-05-13 LAB — HEPATITIS C ANTIBODY: HCV Ab: NEGATIVE

## 2013-05-14 LAB — GC/CHLAMYDIA PROBE AMP
CT Probe RNA: NEGATIVE
GC Probe RNA: NEGATIVE

## 2013-05-15 ENCOUNTER — Ambulatory Visit (INDEPENDENT_AMBULATORY_CARE_PROVIDER_SITE_OTHER): Payer: Medicaid Other | Admitting: *Deleted

## 2013-05-15 DIAGNOSIS — Z111 Encounter for screening for respiratory tuberculosis: Secondary | ICD-10-CM

## 2013-05-15 MED ORDER — TUBERCULIN PPD 5 UNIT/0.1ML ID SOLN
5.0000 [IU] | Freq: Once | INTRADERMAL | Status: AC
Start: 2013-05-15 — End: 2013-05-17
  Administered 2013-05-15: 5 [IU] via INTRADERMAL

## 2013-05-17 ENCOUNTER — Encounter: Payer: Self-pay | Admitting: *Deleted

## 2013-05-17 ENCOUNTER — Ambulatory Visit (INDEPENDENT_AMBULATORY_CARE_PROVIDER_SITE_OTHER): Payer: Medicaid Other | Admitting: *Deleted

## 2013-05-17 DIAGNOSIS — Z111 Encounter for screening for respiratory tuberculosis: Secondary | ICD-10-CM

## 2013-05-17 NOTE — Progress Notes (Signed)
05/17/2013:  Patient's TB skin test is negative.

## 2013-07-10 ENCOUNTER — Emergency Department (INDEPENDENT_AMBULATORY_CARE_PROVIDER_SITE_OTHER)
Admission: EM | Admit: 2013-07-10 | Discharge: 2013-07-10 | Disposition: A | Discharge status: Home / Self Care | Payer: Medicaid Other | Source: Home / Self Care | Attending: Emergency Medicine | Admitting: Emergency Medicine

## 2013-07-10 ENCOUNTER — Emergency Department (INDEPENDENT_AMBULATORY_CARE_PROVIDER_SITE_OTHER): Payer: Medicaid Other

## 2013-07-10 ENCOUNTER — Encounter (HOSPITAL_COMMUNITY): Payer: Self-pay | Admitting: Emergency Medicine

## 2013-07-10 DIAGNOSIS — J111 Influenza due to unidentified influenza virus with other respiratory manifestations: Secondary | ICD-10-CM

## 2013-07-10 DIAGNOSIS — R69 Illness, unspecified: Principal | ICD-10-CM

## 2013-07-10 LAB — POCT URINALYSIS DIP (DEVICE)
Glucose, UA: NEGATIVE mg/dL
HGB URINE DIPSTICK: NEGATIVE
Leukocytes, UA: NEGATIVE
NITRITE: NEGATIVE
Protein, ur: 30 mg/dL — AB
Specific Gravity, Urine: 1.02 (ref 1.005–1.030)
Urobilinogen, UA: 1 mg/dL (ref 0.0–1.0)
pH: 6.5 (ref 5.0–8.0)

## 2013-07-10 LAB — CBC WITH DIFFERENTIAL/PLATELET
Basophils Absolute: 0 10*3/uL (ref 0.0–0.1)
Basophils Relative: 0 % (ref 0–1)
Eosinophils Absolute: 0 10*3/uL (ref 0.0–0.7)
Eosinophils Relative: 0 % (ref 0–5)
HCT: 38.3 % (ref 36.0–46.0)
HEMOGLOBIN: 12.6 g/dL (ref 12.0–15.0)
Lymphocytes Relative: 28 % (ref 12–46)
Lymphs Abs: 0.8 10*3/uL (ref 0.7–4.0)
MCH: 28.5 pg (ref 26.0–34.0)
MCHC: 32.9 g/dL (ref 30.0–36.0)
MCV: 86.7 fL (ref 78.0–100.0)
MONOS PCT: 6 % (ref 3–12)
Monocytes Absolute: 0.2 10*3/uL (ref 0.1–1.0)
NEUTROS ABS: 1.9 10*3/uL (ref 1.7–7.7)
NEUTROS PCT: 66 % (ref 43–77)
Platelets: 127 10*3/uL — ABNORMAL LOW (ref 150–400)
RBC: 4.42 MIL/uL (ref 3.87–5.11)
RDW: 12.8 % (ref 11.5–15.5)
WBC: 2.9 10*3/uL — AB (ref 4.0–10.5)

## 2013-07-10 LAB — POCT PREGNANCY, URINE: Preg Test, Ur: NEGATIVE

## 2013-07-10 MED ORDER — HYDROCODONE-ACETAMINOPHEN 5-325 MG PO TABS
ORAL_TABLET | ORAL | Status: AC
  Filled 2013-07-10: qty 2

## 2013-07-10 MED ORDER — ONDANSETRON 4 MG PO TBDP
8.0000 mg | ORAL_TABLET | Freq: Once | ORAL | Status: AC
  Administered 2013-07-10: 8 mg via ORAL

## 2013-07-10 MED ORDER — OSELTAMIVIR PHOSPHATE 75 MG PO CAPS: 75.0000 mg | ORAL_CAPSULE | Freq: Two times a day (BID) | ORAL | Status: DC

## 2013-07-10 MED ORDER — ONDANSETRON 4 MG PO TBDP
ORAL_TABLET | ORAL | Status: AC
  Filled 2013-07-10: qty 2

## 2013-07-10 MED ORDER — HYDROCODONE-ACETAMINOPHEN 5-325 MG PO TABS
2.0000 | ORAL_TABLET | Freq: Once | ORAL | Status: AC
  Administered 2013-07-10: 2 via ORAL

## 2013-07-10 MED ORDER — PROMETHAZINE HCL 25 MG PO TABS: 25.0000 mg | ORAL_TABLET | Freq: Four times a day (QID) | ORAL | Status: DC | PRN

## 2013-07-10 MED ORDER — ALBUTEROL SULFATE HFA 108 (90 BASE) MCG/ACT IN AERS: 2.0000 | INHALATION_SPRAY | RESPIRATORY_TRACT | Status: DC | PRN

## 2013-07-10 MED ORDER — OXYCODONE-ACETAMINOPHEN 5-325 MG PO TABS: ORAL_TABLET | ORAL | Status: DC

## 2013-07-10 NOTE — ED Notes (Signed)
C/o  Fever.  Body aches.  Chills. Nausea.  Noticed blood with blowing nose.  Symptoms present since yesterday.   Last dose of  Tylenol at 5 a.m for fever.   Denies vomiting and diarrhea.

## 2013-07-10 NOTE — Discharge Instructions (Signed)
Most upper respiratory infections are caused by viruses and do not require antibiotics.  We try to save the antibiotics for when we really need them to prevent bacteria from developing resistance to them.  Here are a few hints about things that can be done at home to help get over an upper respiratory infection quicker: ° °Get extra sleep and extra fluids.  Get 7 to 9 hours of sleep per night and 6 to 8 glasses of water a day.  Getting extra sleep keeps the immune system from getting run down.  Most people with an upper respiratory infection are a little dehydrated.  The extra fluids also keep the secretions liquified and easier to deal with.  Also, get extra vitamin C.  4000 mg per day is the recommended dose. °For the aches, headache, and fever, acetaminophen or ibuprofen are helpful.  These can be alternated every 4 hours.  People with liver disease should avoid large amounts of acetaminophen, and people with ulcer disease, gastroesophageal reflux, gastritis, congestive heart failure, chronic kidney disease, coronary artery disease and the elderly should avoid ibuprofen. °For nasal congestion try Mucinex-D, or if you're having lots of sneezing or clear nasal drainage use Zyrtec-D. People with high blood pressure can take these if their blood pressure is controlled, if not, it's best to avoid the forms with a "D" (decongestants).  You can use the plain Mucinex, Allegra, Claritin, or Zyrtec even if your blood pressure is not controlled.   °A Saline nasal spray such as Ocean Spray can also help.  You can add a decongestant sprays such as Afrin, but you should not use the decongestant sprays for more than 3 or 4 days since they can be habituating.  Breathe Rite nasal strips can also offer a non-drug alternative treatment to nasal congestion, especially at night. °For people with symptoms of sinusitis, sleeping with your head elevated can be helpful.  For sinus pain, moist, hot compresses to the face may provide some  relief.  Many people find that inhaling steam as in a shower or from a pot of steaming water can help. °For any viral infection, zinc containing lozenges such as Cold-Eze or Zicam are helpful.  Zinc helps to fight viral infection.  Hot salt water gargles (8 oz of hot water, 1/2 tsp of table salt, and a pinch of baking soda) can give relief as well as hot beverages such as hot tea.  Sucrets extra strength lozenges will help the sore throat.  °For the cough, take Delsym 2 tsp every 12 hours.  It has also been found recently that Aleve can help control a cough.  The dose is 1 to 2 tablets twice daily with food.  This can be combined with Delsym. (Note, if you are taking ibuprofen, you should not take Aleve as well--take one or the other.) °A cool mist vaporizer will help keep your mucous membranes from drying out.  ° °It's important when you have an upper respiratory infection not to pass the infection to others.  This involves being very careful about the following: ° °Frequent hand washing or use of hand sanitizer, especially after coughing, sneezing, blowing your nose or touching your face, nose or eyes. °Do not shake hands or touch anyone and try to avoid touching surfaces that other people use such as doorknobs, shopping carts, telephones and computer keyboards. °Use tissues and dispose of them properly in a garbage can or ziplock bag. °Cough into your sleeve. °Do not let others eat or   drink after you. ° °It's also important to recognize the signs of serious illness and get evaluated if they occur: °Any respiratory infection that lasts more than 7 to 10 days.  Yellow nasal drainage and sputum are not reliable indicators of a bacterial infection, but if they last for more than 1 week, see your doctor. °Fever and sore throat can indicate strep. °Fever and cough can indicate influenza or pneumonia. °Any kind of severe symptom such as difficulty breathing, intractable vomiting, or severe pain should prompt you to see  a doctor as soon as possible. ° ° °Your body's immune system is really the thing that will get rid of this infection.  Your immune system is comprised of 2 types of specialized cells called T cells and B cells.  T cells coordinate the array of cells in your body that engulf invading bacteria or viruses while B cells orchestrate the production of antibodies that neutralize infection.  Anything we do or any medications we give you, will just strengthen your immune system or help it clear up the infection quicker.  Here are a few helpful hints to improve your immune system to help overcome this illness or to prevent future infections: °· A few vitamins can improve the health of your immune system.  That's why your diet should include plenty of fruits, vegetables, fish, nuts, and whole grains. °· Vitamin A and bet-carotene can increase the cells that fight infections (T cells and B cells).  Vitamin A is abundant in dark greens and orange vegetables such as spinach, greens, sweet potatoes, and carrots. °· Vitamin B6 contributes to the maturation of white blood cells, the cells that fight disease.  Foods with vitamin B6 include cold cereal and bananas. °· Vitamin C is credited with preventing colds because it increases white blood cells and also prevents cellular damage.  Citrus fruits, peaches and green and red bell peppers are all hight in vitamin C. °· Vitamin E is an anti-oxidant that encourages the production of natural killer cells which reject foreign invaders and B cells that produce antibodies.  Foods high in vitamin E include wheat germ, nuts and seeds. °· Foods high in omega-3 fatty acids found in foods like salmon, tuna and mackerel boost your immune system and help cells to engulf and absorb germs. °· Probiotics are good bacteria that increase your T cells.  These can be found in yogurt and are available in supplements such as Culturelle or Align. °· Moderate exercise increases the strength of your immune  system and your ability to recover from illness.  I suggest 3 to 5 moderate intensity 30 minute workouts per week.   °· Sleep is another component of maintaining a strong immune system.  It enables your body to recuperate from the day's activities, stress and work.  My recommendation is to get between 7 and 9 hours of sleep per night. °· If you smoke, try to quit completely or at least cut down.  Drink alcohol only in moderation if at all.  No more than 2 drinks daily for men or 1 for women. °· Get a flu vaccine early in the fall or if you have not gotten one yet, once this illness has run its course.  If you are over 65, a smoker, or an asthmatic, get a pneumococcal vaccine. °· My final recommendation is to maintain a healthy weight.  Excess weight can impair the immune system by interfering with the way the immune system deals with invading viruses or   bacteria. ° ° ° °Influenza, Adult °Influenza ("the flu") is a viral infection of the respiratory tract. It occurs more often in winter months because people spend more time in close contact with one another. Influenza can make you feel very sick. Influenza easily spreads from person to person (contagious). °CAUSES  °Influenza is caused by a virus that infects the respiratory tract. You can catch the virus by breathing in droplets from an infected person's cough or sneeze. You can also catch the virus by touching something that was recently contaminated with the virus and then touching your mouth, nose, or eyes. °SYMPTOMS  °Symptoms typically last 4 to 10 days and may include: °· Fever. °· Chills. °· Headache, body aches, and muscle aches. °· Sore throat. °· Chest discomfort and cough. °· Poor appetite. °· Weakness or feeling tired. °· Dizziness. °· Nausea or vomiting. °DIAGNOSIS  °Diagnosis of influenza is often made based on your history and a physical exam. A nose or throat swab test can be done to confirm the diagnosis. °RISKS AND COMPLICATIONS °You may be at risk  for a more severe case of influenza if you smoke cigarettes, have diabetes, have chronic heart disease (such as heart failure) or lung disease (such as asthma), or if you have a weakened immune system. Elderly people and pregnant women are also at risk for more serious infections. The most common complication of influenza is a lung infection (pneumonia). Sometimes, this complication can require emergency medical care and may be life-threatening. °PREVENTION  °An annual influenza vaccination (flu shot) is the best way to avoid getting influenza. An annual flu shot is now routinely recommended for all adults in the [place] °TREATMENT  °In mild cases, influenza goes away on its own. Treatment is directed at relieving symptoms. For more severe cases, your caregiver may prescribe antiviral medicines to shorten the sickness. Antibiotic medicines are not effective, because the infection is caused by a virus, not by bacteria. °HOME CARE INSTRUCTIONS °· Only take over-the-counter or prescription medicines for pain, discomfort, or fever as directed by your caregiver. °· Use a cool mist humidifier to make breathing easier. °· Get plenty of rest until your temperature returns to normal. This usually takes 3 to 4 days. °· Drink enough fluids to keep your urine clear or pale yellow. °· Cover your mouth and nose when coughing or sneezing, and wash your hands well to avoid spreading the virus. °· Stay home from work or school until your fever has been gone for at least 1 full day. °SEEK MEDICAL CARE IF:  °· You have chest pain or a deep cough that worsens or produces more mucus. °· You have nausea, vomiting, or diarrhea. °SEEK IMMEDIATE MEDICAL CARE IF:  °· You have difficulty breathing, shortness of breath, or your skin or nails turn bluish. °· You have severe neck pain or stiffness. °· You have a severe headache, facial pain, or earache. °· You have a worsening or recurring fever. °· You have nausea or vomiting that cannot be  controlled. °MAKE SURE YOU: °· Understand these instructions. °· Will watch your condition. °· Will get help right away if you are not doing well or get worse. °Document Released: 02/21/2000 Document Revised: 08/25/2011 Document Reviewed: 05/25/2011 °[employer]® Patient Information ©2014 [employer]. ° °

## 2013-07-10 NOTE — ED Provider Notes (Signed)
Chief Complaint   Chief Complaint  Patient presents with  . Fever    History of Present Illness   Ana [name] is a 23 year old female had a history since yesterday of a fever of up to 101.8, sweats, headaches, generalized myalgias, pain in her back, chest, and abdomen. She's had nasal congestion with bloody drainage and dry cough she felt nauseated but has not vomited or had diarrhea. She denies any abdominal pain or urinary symptoms. Last menstrual period was April 5. No GYN complaints. She's been exposed to her boyfriend who had pneumonia. She denies any tick bites or skin rash.  Review of Systems   Other than as noted above, the patient denies any of the following symptoms. Systemic:  No sweats, fatigue, myalgias, headache, weight loss or anorexia. Eye:  No redness or drainage. ENT:  No earache, nasal congestion, rhinorrhea, sinus pressure, or sore throat. No adenopathy or stiff neck. Lungs:  No cough, sputum production, wheezing, shortness of breath.  Cardiovascular:  No chest pain. GI:  No nausea, vomiting, abdominal pain or diarrhea. GU:  No dysuria, frequency, or hematuria. Skin:  No rash.  PMFSH   Past medical history, family history, social history, meds, and allergies were reviewed.   Physical Examination     Vital signs:  BP 140/78  Pulse 89  Temp(Src) 99.3 F (37.4 C) (Oral)  Resp 16  SpO2 100%  LMP 06/11/2013 General:  Alert, in no distress. Eye:  PERRL, full EOMs.  Lids and conjunctivas were normal. ENT:  TMs and canals were normal, without erythema or inflammation.  Nasal mucosa was clear and uncongested, without drainage.  Mucous membranes were moist.  Pharynx was clear, without exudate or drainage.  There were no oral ulcerations or lesions. Neck:  Supple, no adenopathy, tenderness or mass. Thyroid was normal. No meningeal signs. Lungs:  No respiratory distress.  Lungs were clear to auscultation, without wheezes, rales or rhonchi.  Breath sounds were clear  and equal bilaterally. Heart:  Regular rhythm, without gallops, murmers or rubs. Abdomen:  Soft, flat, and non-tender to palpation.  No hepatosplenomagaly or mass. Extremities:  No swelling, erythema, or joint pain to palpation. Skin:  Clear, warm, and dry, without rash or lesions.  Labs   Results for orders placed during the hospital encounter of 07/10/13  CBC WITH DIFFERENTIAL      Result Value Ref Range   WBC 2.9 (*) 4.0 - 10.5 K/uL   RBC 4.42  3.87 - 5.11 MIL/uL   Hemoglobin 12.6  12.0 - 15.0 g/dL   HCT 40.938.3  81.136.0 - 91.446.0 %   MCV 86.7  78.0 - 100.0 fL   MCH 28.5  26.0 - 34.0 pg   MCHC 32.9  30.0 - 36.0 g/dL   RDW 78.212.8  95.611.5 - 21.315.5 %   Platelets 127 (*) 150 - 400 K/uL   Neutrophils Relative % 66  43 - 77 %   Neutro Abs 1.9  1.7 - 7.7 K/uL   Lymphocytes Relative 28  12 - 46 %   Lymphs Abs 0.8  0.7 - 4.0 K/uL   Monocytes Relative 6  3 - 12 %   Monocytes Absolute 0.2  0.1 - 1.0 K/uL   Eosinophils Relative 0  0 - 5 %   Eosinophils Absolute 0.0  0.0 - 0.7 K/uL   Basophils Relative 0  0 - 1 %   Basophils Absolute 0.0  0.0 - 0.1 K/uL  POCT URINALYSIS DIP (DEVICE)  Result Value Ref Range   Glucose, UA NEGATIVE  NEGATIVE mg/dL   Bilirubin Urine SMALL (*) NEGATIVE   Ketones, ur TRACE (*) NEGATIVE mg/dL   Specific Gravity, Urine 1.020  1.005 - 1.030   Hgb urine dipstick NEGATIVE  NEGATIVE   pH 6.5  5.0 - 8.0   Protein, ur 30 (*) NEGATIVE mg/dL   Urobilinogen, UA 1.0  0.0 - 1.0 mg/dL   Nitrite NEGATIVE  NEGATIVE   Leukocytes, UA NEGATIVE  NEGATIVE  POCT PREGNANCY, URINE      Result Value Ref Range   Preg Test, Ur NEGATIVE  NEGATIVE    [place] spotted fever IgM antibody obtained.  Radiology   Dg Chest 2 View  07/10/2013   CLINICAL DATA:  Fever.  Cough.  Chills and body aches.  EXAM: CHEST  2 VIEW  COMPARISON:  12/02/2012  FINDINGS: Mild S-shaped thoracic spine curvature. Midline trachea. Normal heart size and mediastinal contours. No pleural effusion or pneumothorax.  Clear lungs.  IMPRESSION: No acute cardiopulmonary disease.   Electronically Signed   By: Jeronimo GreavesKyle  Talbot M.D.   On: 07/10/2013 09:36    [facility]   She was given Zofran ODT 8 mg sublingually for the nausea and Norco 5/325 2 by mouth for her pain, she states the Norco did not help much to relieve the pain.  Assessment   The encounter diagnosis was Influenza-like illness.  Plan   1.  Meds:  The following meds were prescribed:   Discharge Medication List as of 07/10/2013 10:15 AM    START taking these medications   Details  !! albuterol (PROVENTIL HFA;VENTOLIN HFA) 108 (90 BASE) MCG/ACT inhaler Inhale 2 puffs into the lungs every 4 (four) hours as needed for wheezing or shortness of breath., Starting 07/10/2013, Until Discontinued, Normal    oseltamivir (TAMIFLU) 75 MG capsule Take 1 capsule (75 mg total) by mouth every 12 (twelve) hours., Starting 07/10/2013, Until Discontinued, Normal    oxyCODONE-acetaminophen (PERCOCET) 5-325 MG per tablet 1 to 2 tablets every 6 hours as needed for pain., Print    promethazine (PHENERGAN) 25 MG tablet Take 1 tablet (25 mg total) by mouth every 6 (six) hours as needed for nausea or vomiting., Starting 07/10/2013, Until Discontinued, Normal     !! - Potential duplicate medications found. Please discuss with provider.      2.  Patient Education/Counseling:  The patient was given appropriate handouts, self care instructions, and instructed in symptomatic relief.  May use Tylenol or ibuprofen for fever. Rest for the next couple days and get extra fluids. Call her back about results of the [facility] [place] fever IgM antibody. She should return here if no better in 2 or 3 days.  3.  Follow up:  The patient was told to follow up here if no better in 2 to 3 days, or sooner if becoming worse in any way, and given some red flag symptoms such as increasing fever, severe headache or stiff neck, difficulty breathing, chest pain, abdominal pain, or  persistent vomiting which would prompt immediate return.  Follow up here as necessary.     Reuben Likesavid C Sonal Dorwart, MD 07/10/13 [phone]

## 2013-07-12 LAB — ROCKY MTN SPOTTED FVR AB, IGM-BLOOD: RMSF IgM: 0.47 IV (ref 0.00–0.89)

## 2013-07-12 NOTE — Progress Notes (Signed)
Quick Note:  Test result was normal. No further action is needed at this time. The patient was called and informed of the results of this normal test. ______

## 2013-07-12 NOTE — ED Notes (Signed)
RMSF IgM 0.47 neg.  Dr. Lorenz CoasterKeller notified pt. of neg. result. Desiree LucySuzanne M Kecia Swoboda 07/12/2013

## 2013-08-30 ENCOUNTER — Ambulatory Visit: Payer: Medicaid Other | Admitting: Obstetrics & Gynecology

## 2014-01-08 ENCOUNTER — Encounter (HOSPITAL_COMMUNITY): Payer: Self-pay | Admitting: Emergency Medicine

## 2014-01-15 ENCOUNTER — Emergency Department (HOSPITAL_COMMUNITY): Payer: Medicaid Other

## 2014-01-15 ENCOUNTER — Encounter (HOSPITAL_COMMUNITY): Payer: Self-pay | Admitting: Emergency Medicine

## 2014-01-15 ENCOUNTER — Emergency Department (HOSPITAL_COMMUNITY)
Admission: EM | Admit: 2014-01-15 | Discharge: 2014-01-16 | Disposition: A | Discharge status: Home / Self Care | Payer: Medicaid Other | Attending: Emergency Medicine | Admitting: Emergency Medicine

## 2014-01-15 DIAGNOSIS — Z8619 Personal history of other infectious and parasitic diseases: Secondary | ICD-10-CM | POA: Insufficient documentation

## 2014-01-15 DIAGNOSIS — Y9389 Activity, other specified: Secondary | ICD-10-CM | POA: Insufficient documentation

## 2014-01-15 DIAGNOSIS — Y998 Other external cause status: Secondary | ICD-10-CM | POA: Diagnosis not present

## 2014-01-15 DIAGNOSIS — S99922A Unspecified injury of left foot, initial encounter: Secondary | ICD-10-CM | POA: Diagnosis not present

## 2014-01-15 DIAGNOSIS — Z792 Long term (current) use of antibiotics: Secondary | ICD-10-CM | POA: Diagnosis not present

## 2014-01-15 DIAGNOSIS — Z79899 Other long term (current) drug therapy: Secondary | ICD-10-CM | POA: Diagnosis not present

## 2014-01-15 DIAGNOSIS — W51XXXA Accidental striking against or bumped into by another person, initial encounter: Secondary | ICD-10-CM | POA: Diagnosis not present

## 2014-01-15 DIAGNOSIS — Y9289 Other specified places as the place of occurrence of the external cause: Secondary | ICD-10-CM | POA: Insufficient documentation

## 2014-01-15 DIAGNOSIS — T1490XA Injury, unspecified, initial encounter: Secondary | ICD-10-CM

## 2014-01-15 MED ORDER — NAPROXEN 500 MG PO TABS: 500.0000 mg | ORAL_TABLET | Freq: Two times a day (BID) | ORAL | Status: DC

## 2014-01-15 MED ORDER — TRAMADOL HCL 50 MG PO TABS
50.0000 mg | ORAL_TABLET | Freq: Four times a day (QID) | ORAL | Status: DC | PRN
Start: 2014-01-15 — End: 2014-03-14

## 2014-01-15 MED ORDER — HYDROCODONE-ACETAMINOPHEN 5-325 MG PO TABS
1.0000 | ORAL_TABLET | Freq: Once | ORAL | Status: AC
  Administered 2014-01-15: 1 via ORAL
  Filled 2014-01-15: qty 1

## 2014-01-15 MED ORDER — NAPROXEN 250 MG PO TABS
500.0000 mg | ORAL_TABLET | Freq: Once | ORAL | Status: AC
  Administered 2014-01-15: 500 mg via ORAL
  Filled 2014-01-15: qty 2

## 2014-01-15 NOTE — ED Notes (Signed)
Otho tech paged.  

## 2014-01-15 NOTE — ED Provider Notes (Signed)
CSN: [id number]     Arrival date & time 01/15/14  2310 History  This chart was scribed for non-physician practitioner, Harle BattiestElizabeth Savyon Loken, NP working with Gwyneth SproutWhitney Plunkett, MD by Gwenyth Oberatherine Macek, ED scribe. This patient was seen in room TR10C/TR10C and the patient's care was started at 11:25 PM  No chief complaint on file.  The history is provided by the patient. No language interpreter was used.   HPI Comments: Ana [name] is a 23 y.o. female who presents to the Emergency Department complaining of 10/10 constant, gradually worsening pain to the dorsal aspect of her left foot pain that started 12 hours ago. She states the corner of a clothing dryer fell onto top of foot while she was moving and that, later, someone stepped on the same foot. Since trauma, she has been able to walk without difficulty. Pt tried a foot massage with no relief, but notes that elevation in ED has improved symptoms. She denies ankle pain and swelling.  Past Medical History  Diagnosis Date  . Chlamydia Jan 2014  . Abnormal Pap smear    Past Surgical History  Procedure Laterality Date  . No past surgeries     Family History  Problem Relation Age of Onset  . Hypertension Mother   . Hypertension Maternal Grandmother    History  Substance Use Topics  . Smoking status: Never Smoker   . Smokeless tobacco: Never Used  . Alcohol Use: No     Comment: occasional/not since pregnancy   OB History    Gravida Para Term Preterm AB TAB SAB Ectopic Multiple Living   5 5 4  0 0 0 0 0 0 4     Review of Systems  Musculoskeletal: Positive for arthralgias. Negative for joint swelling.  Skin: Negative for color change and wound.    Allergies  Review of patient's allergies indicates no known allergies.  Home Medications   Prior to Admission medications   Medication Sig Start Date End Date Taking? Authorizing Provider  albuterol (PROVENTIL HFA;VENTOLIN HFA) 108 (90 BASE) MCG/ACT inhaler Inhale 2 puffs into the lungs  every 4 (four) hours as needed for wheezing. 12/02/12   Reuben Likesavid C Keller, MD  albuterol (PROVENTIL HFA;VENTOLIN HFA) 108 (90 BASE) MCG/ACT inhaler Inhale 2 puffs into the lungs every 4 (four) hours as needed for wheezing or shortness of breath. 07/10/13   Reuben Likesavid C Keller, MD  HYDROcodone-acetaminophen (NORCO/VICODIN) 5-325 MG per tablet 1 to 2 tabs every 4 to 6 hours as needed for pain. 12/02/12   Reuben Likesavid C Keller, MD  IRON PO Take 1 tablet by mouth daily.    Historical Provider, MD  metroNIDAZOLE (FLAGYL) 500 MG tablet Take 1 tablet (500 mg total) by mouth 2 (two) times daily. 12/02/12   Reuben Likesavid C Keller, MD  oseltamivir (TAMIFLU) 75 MG capsule Take 1 capsule (75 mg total) by mouth every 12 (twelve) hours. 07/10/13   Reuben Likesavid C Keller, MD  oxyCODONE-acetaminophen (PERCOCET) 5-325 MG per tablet 1 to 2 tablets every 6 hours as needed for pain. 07/10/13   Reuben Likesavid C Keller, MD  Prenatal Vit-Fe Fumarate-FA (PRENATAL MULTIVITAMIN) TABS Take 1 tablet by mouth daily with breakfast.    Historical Provider, MD  promethazine (PHENERGAN) 25 MG tablet Take 1 tablet (25 mg total) by mouth every 6 (six) hours as needed for nausea or vomiting. 07/10/13   Reuben Likesavid C Keller, MD  ranitidine (ZANTAC) 150 MG capsule Take 1 capsule (150 mg total) by mouth 2 (two) times daily. 12/02/12   Dineen Kidavid C  Lorenz CoasterKeller, MD   There were no vitals taken for this visit. Physical Exam  Constitutional: She is oriented to person, place, and time. She appears well-developed and well-nourished. No distress.  HENT:  Head: Normocephalic and atraumatic.  Mouth/Throat: Oropharynx is clear and moist. No oropharyngeal exudate.  Eyes: Pupils are equal, round, and reactive to light.  Neck: Neck supple.  Cardiovascular: Normal rate.   Pulmonary/Chest: Effort normal.  Musculoskeletal: She exhibits tenderness. She exhibits no edema.  Pain and tenderness to palpation along dorsal aspect from the metatarsals to the ankle. No ecchymosis. +2 DP pulses. ROM decreased due to pain.   Neurological: She is alert and oriented to person, place, and time. No cranial nerve deficit.  Skin: Skin is warm and dry. No rash noted.  Psychiatric: She has a normal mood and affect. Her behavior is normal.  Nursing note and vitals reviewed.   DIAGNOSTIC STUDIES: Oxygen Saturation is 100% on RA, normal by my interpretation.    COORDINATION OF CARE: 11:32 PM Discussed treatment plan with pt at bedside and pt agreed to plan.  ED Course  Procedures (including critical care time) Labs Review Labs Reviewed - No data to display  Imaging Review Dg Foot Complete Left  01/15/2014   CLINICAL DATA:  Crush injury of the foot due to clothes dryer. Pain in the fourth and fifth metatarsals.  EXAM: LEFT FOOT - COMPLETE 3+ VIEW  COMPARISON:  None.  FINDINGS: There is no evidence of fracture or dislocation. There is no evidence of arthropathy or other focal bone abnormality. Soft tissues are unremarkable.  IMPRESSION: Negative.   Electronically Signed   By: Paulina FusiMark  Shogry M.D.   On: 01/15/2014 23:51     EKG Interpretation None      MDM   Final diagnoses:  Trauma  Foot injury, left, initial encounter   23 yo female with foot pain after injury today. Her x-ray is negative for obvious fracture or dislocation. Pain managed in ED. Watson Reid splint and crutches provided for comfort. Discharge instructions include referral to ortho if symptoms persist. Conservative therapy recommended and discussed. Patient will be dc home & is agreeable with above plan. Return precautions provided.  I personally performed the services described in this documentation, which was scribed in my presence. The recorded information has been reviewed and is accurate.  Filed Vitals:   01/15/14 2316 01/16/14 0031  BP: 152/77 113/67  Pulse: 81 71  Temp: 98.2 F (36.8 C)   TempSrc: Oral   Resp: 22   Height: 5\' 4"  (1.626 m)   Weight: 156 lb (70.761 kg)   SpO2: 100% 100%   Meds given in ED:  Medications  naproxen  (NAPROSYN) tablet 500 mg (500 mg Oral Given 01/15/14 2338)  HYDROcodone-acetaminophen (NORCO/VICODIN) 5-325 MG per tablet 1 tablet (1 tablet Oral Given 01/15/14 2337)    New Prescriptions   NAPROXEN (NAPROSYN) 500 MG TABLET    Take 1 tablet (500 mg total) by mouth 2 (two) times daily with a meal.   TRAMADOL (ULTRAM) 50 MG TABLET    Take 1 tablet (50 mg total) by mouth every 6 (six) hours as needed.        Harle BattiestElizabeth Carlota Philley, NP 01/16/14 [id number]  Geoffery Lyonsouglas Delo, MD 01/16/14 [phone]

## 2014-01-15 NOTE — ED Notes (Signed)
Pt states that she either dropped a washing machine on her foot, or someone stepped on her foot. She has been having pain to the top of her left foot since 1100 AM today. Pt states that pain has gotten progressively worse.

## 2014-01-15 NOTE — ED Notes (Signed)
Pt returned from radiology.

## 2014-01-16 NOTE — ED Notes (Signed)
Ortho tech at bedside 

## 2014-01-16 NOTE — Discharge Instructions (Signed)
Please follow the directions provided. You may follow-up with the orthopedic referral given, if your pain persists. You may take the naproxen 500 mg by mouth twice a day for inflammation and you may take the Flexeril every 6 hours for pain. You may use the splint and crutches as needed and you may begin walking and putting weight on your foot as tolerated. Don't hesitate to return for any new, worsening, or concerning symptoms.  SEEK IMMEDIATE MEDICAL CARE IF:  You have increased redness, swelling, or pain in your foot.  Your swelling or pain is not relieved with medicines.  You have loss of feeling in your foot or are unable to move your toes.  Your foot turns cold or blue.  You have pain when you move your toes.  Your foot becomes warm to the touch.  Your contusion does not improve in 2 days.

## 2014-03-09 NOTE — L&D Delivery Note (Cosign Needed)
Delivery Note At 5:10 PM a viable female was delivered via  (Presentation: LOA ).  APGAR: 8, 9; weight: pending. Infant dried and placed on pt's abd. Cord clamped and cut by FOB. Hospital cord blood sample collected.  Placenta status: spont, intact.  Cord: 3 vessel   Anesthesia: Epidural  Episiotomy:  none Lacerations:  none Est. Blood Loss (mL):  308  Mom to postpartum.  Baby to Couplet care / Skin to Skin.  Cam Hai CNM 10/18/2014, 5:36 PM

## 2014-03-14 ENCOUNTER — Inpatient Hospital Stay (HOSPITAL_COMMUNITY)
Admission: AD | Admit: 2014-03-14 | Discharge: 2014-03-14 | Disposition: A | Discharge status: Home / Self Care | Payer: Medicaid Other | Source: Ambulatory Visit | Attending: Family Medicine | Admitting: Family Medicine

## 2014-03-14 ENCOUNTER — Inpatient Hospital Stay (HOSPITAL_COMMUNITY): Payer: Medicaid Other

## 2014-03-14 ENCOUNTER — Encounter (HOSPITAL_COMMUNITY): Payer: Self-pay | Admitting: *Deleted

## 2014-03-14 DIAGNOSIS — N760 Acute vaginitis: Secondary | ICD-10-CM

## 2014-03-14 DIAGNOSIS — O26899 Other specified pregnancy related conditions, unspecified trimester: Secondary | ICD-10-CM

## 2014-03-14 DIAGNOSIS — O9989 Other specified diseases and conditions complicating pregnancy, childbirth and the puerperium: Secondary | ICD-10-CM | POA: Insufficient documentation

## 2014-03-14 DIAGNOSIS — K219 Gastro-esophageal reflux disease without esophagitis: Secondary | ICD-10-CM | POA: Diagnosis present

## 2014-03-14 DIAGNOSIS — O99611 Diseases of the digestive system complicating pregnancy, first trimester: Secondary | ICD-10-CM | POA: Diagnosis not present

## 2014-03-14 DIAGNOSIS — Z3491 Encounter for supervision of normal pregnancy, unspecified, first trimester: Secondary | ICD-10-CM

## 2014-03-14 DIAGNOSIS — B9689 Other specified bacterial agents as the cause of diseases classified elsewhere: Secondary | ICD-10-CM

## 2014-03-14 DIAGNOSIS — Z3A01 Less than 8 weeks gestation of pregnancy: Secondary | ICD-10-CM | POA: Insufficient documentation

## 2014-03-14 DIAGNOSIS — O23591 Infection of other part of genital tract in pregnancy, first trimester: Secondary | ICD-10-CM

## 2014-03-14 DIAGNOSIS — R109 Unspecified abdominal pain: Secondary | ICD-10-CM | POA: Diagnosis not present

## 2014-03-14 LAB — URINALYSIS, ROUTINE W REFLEX MICROSCOPIC
Bilirubin Urine: NEGATIVE
Glucose, UA: NEGATIVE mg/dL
Hgb urine dipstick: NEGATIVE
Ketones, ur: NEGATIVE mg/dL
LEUKOCYTES UA: NEGATIVE
Nitrite: NEGATIVE
PH: 7 (ref 5.0–8.0)
PROTEIN: NEGATIVE mg/dL
SPECIFIC GRAVITY, URINE: 1.02 (ref 1.005–1.030)
Urobilinogen, UA: 0.2 mg/dL (ref 0.0–1.0)

## 2014-03-14 LAB — CBC
HEMATOCRIT: 34.9 % — AB (ref 36.0–46.0)
HEMOGLOBIN: 11.8 g/dL — AB (ref 12.0–15.0)
MCH: 29.4 pg (ref 26.0–34.0)
MCHC: 33.8 g/dL (ref 30.0–36.0)
MCV: 87 fL (ref 78.0–100.0)
Platelets: 225 10*3/uL (ref 150–400)
RBC: 4.01 MIL/uL (ref 3.87–5.11)
RDW: 12.9 % (ref 11.5–15.5)
WBC: 10 10*3/uL (ref 4.0–10.5)

## 2014-03-14 LAB — WET PREP, GENITAL
Trich, Wet Prep: NONE SEEN
YEAST WET PREP: NONE SEEN

## 2014-03-14 LAB — HCG, QUANTITATIVE, PREGNANCY: hCG, Beta Chain, Quant, S: 29500 m[IU]/mL — ABNORMAL HIGH (ref <5)

## 2014-03-14 LAB — POCT PREGNANCY, URINE: Preg Test, Ur: POSITIVE — AB

## 2014-03-14 LAB — RPR

## 2014-03-14 MED ORDER — METRONIDAZOLE 500 MG PO TABS: 500.0000 mg | ORAL_TABLET | Freq: Two times a day (BID) | ORAL | Status: DC

## 2014-03-14 MED ORDER — PRENATAL VITAMINS 28-0.8 MG PO TABS: 1.0000 | ORAL_TABLET | Freq: Every day | ORAL | Status: DC

## 2014-03-14 NOTE — MAU Note (Signed)
Period didn't come in Dec, did not do a preg test.   Has been having nausea, heartburn/chest pain.  Has rx for prilosec- unsure if can take it if preg.

## 2014-03-14 NOTE — Discharge Instructions (Signed)
You have been seen today for a confirmed intrauterine pregnancy. We were unable to determine the gestational age at this time. You need to follow up in the health department to establish care. Please begin taking prenatal vitamins and antibiotics as prescribed. Return if you experience any vaginal bleeding, increased abdominal pain or fever.

## 2014-03-14 NOTE — MAU Provider Note (Signed)
History     CSN: [id number]  Arrival date and time: 03/14/14 1146   First Provider Initiated Contact with Patient 03/14/14 1254      Chief Complaint  Patient presents with  . Nausea  . Heartburn  . Possible Pregnancy   HPI 24 y.o. [id number] [redacted]w[redacted]d by LMP presents with increased acid reflux x 2 weeks, bil lower abd pain. Pt reports hx of GERD, taking omeprazole, worsening reflux with 2 episodes of nausea over the last week. Pt reports she has stopped omeprazole 2d ago, reflux relieved with tums at this time.  Pt reports pain is intermittent 6/10 bil lower quad cramping. Denies pain currently. Pt denies associated n/v, fever/chills, vaginal bleeding. Pt reports she has white/clear thin vaginal discharge "there's just more of it now". Pt denies odor to discharge. Pt denies pain with intercourse, last intercourse 4d ago.  Pt reports hx of BV, GC. Reports in monogamous relationship, "But I want you to test me for everything".  Pt reports hx of abnormal pap, unable to report last pap  OB History    Gravida Para Term Preterm AB TAB SAB Ectopic Multiple Living   0 0 0 0 0 0 4      Past Medical History  Diagnosis Date  . Chlamydia Jan 2014  . Abnormal Pap smear     Past Surgical History  Procedure Laterality Date  . No past surgeries      Family History  Problem Relation Age of Onset  . Hypertension Mother   . Hypertension Maternal Grandmother     History  Substance Use Topics  . Smoking status: Never Smoker   . Smokeless tobacco: Never Used  . Alcohol Use: No     Comment: occasional/not since pregnancy    Allergies: No Known Allergies  Prescriptions prior to admission  Medication Sig Dispense Refill Last Dose  . albuterol (PROVENTIL HFA;VENTOLIN HFA) 108 (90 BASE) MCG/ACT inhaler Inhale 2 puffs into the lungs every 4 (four) hours as needed for wheezing. 1 Inhaler 0 03/13/2014 at Unknown time  . oxyCODONE-acetaminophen (PERCOCET) 5-325 MG per tablet 1 to 2 tablets every  6 hours as needed for pain. 20 tablet 0 Past Month at Unknown time  . albuterol (PROVENTIL HFA;VENTOLIN HFA) 108 (90 BASE) MCG/ACT inhaler Inhale 2 puffs into the lungs every 4 (four) hours as needed for wheezing or shortness of breath. (Patient not taking: Reported on 03/14/2014) 1 Inhaler 5   . HYDROcodone-acetaminophen (NORCO/VICODIN) 5-325 MG per tablet 1 to 2 tabs every 4 to 6 hours as needed for pain. (Patient not taking: Reported on 03/14/2014) 20 tablet 0 Unknown at Unknown time  . metroNIDAZOLE (FLAGYL) 500 MG tablet Take 1 tablet (500 mg total) by mouth 2 (two) times daily. (Patient not taking: Reported on 03/14/2014) 14 tablet 0 Unknown at Unknown time  . naproxen (NAPROSYN) 500 MG tablet Take 1 tablet (500 mg total) by mouth 2 (two) times daily with a meal. (Patient not taking: Reported on 03/14/2014) 30 tablet 0   . oseltamivir (TAMIFLU) 75 MG capsule Take 1 capsule (75 mg total) by mouth every 12 (twelve) hours. (Patient not taking: Reported on 03/14/2014) 10 capsule 0   . promethazine (PHENERGAN) 25 MG tablet Take 1 tablet (25 mg total) by mouth every 6 (six) hours as needed for nausea or vomiting. (Patient not taking: Reported on 03/14/2014) 30 tablet 0   . ranitidine (ZANTAC) 150 MG capsule Take 1 capsule (150 mg total) by mouth 2 (two)  times daily. (Patient not taking: Reported on 03/14/2014) 30 capsule 0 Unknown at Unknown time  . traMADol (ULTRAM) 50 MG tablet Take 1 tablet (50 mg total) by mouth every 6 (six) hours as needed. (Patient not taking: Reported on 03/14/2014) 6 tablet 0     Review of Systems  Constitutional: Negative for fever, chills, weight loss and malaise/fatigue.  Respiratory: Negative for shortness of breath.   Cardiovascular: Negative for chest pain.  Gastrointestinal: Positive for heartburn, nausea and abdominal pain. Negative for vomiting, diarrhea, constipation, blood in stool and melena.  Genitourinary: Negative for dysuria, urgency, frequency, hematuria and flank pain.    Physical Exam   Blood pressure 132/63, pulse 69, temperature 98.9 F (37.2 C), temperature source Oral, resp. rate 18, height 5\' 4"  (1.626 m), weight 69.4 kg (153 lb), last menstrual period 01/25/2014.  Physical Exam  Constitutional: She is oriented to person, place, and time. She appears well-developed and well-nourished.  Cardiovascular: Normal rate and normal heart sounds.   Respiratory: Effort normal and breath sounds normal.  GI: Soft. Bowel sounds are normal. She exhibits no mass. There is no tenderness. There is no rebound and no guarding.  No cva tenderness  Genitourinary: Vagina normal and uterus normal.  Cervix pink with area of erosion from 11 to 1 o'clock position, no lesions. External os fingertip opening, internal os closed. Cervix is anterior high and long. No vaginal bleeding noted, small amount of thin white vaginal discharge.  Neurological: She is alert and oriented to person, place, and time.  Skin: Skin is warm and dry.  Psychiatric: She has a normal mood and affect. Her behavior is normal. Judgment and thought content normal.    MAU Course  Procedures Results for orders placed or performed during the hospital encounter of 03/14/14 (from the past 24 hour(s))  Pregnancy, urine POC     Status: Abnormal   Collection Time: 03/14/14 12:08 PM  Result Value Ref Range   Preg Test, Ur POSITIVE (A) NEGATIVE  Urinalysis, Routine w reflex microscopic     Status: None   Collection Time: 03/14/14 12:13 PM  Result Value Ref Range   Color, Urine YELLOW YELLOW   APPearance CLEAR CLEAR   Specific Gravity, Urine 1.020 1.005 - 1.030   pH 7.0 5.0 - 8.0   Glucose, UA NEGATIVE NEGATIVE mg/dL   Hgb urine dipstick NEGATIVE NEGATIVE   Bilirubin Urine NEGATIVE NEGATIVE   Ketones, ur NEGATIVE NEGATIVE mg/dL   Protein, ur NEGATIVE NEGATIVE mg/dL   Urobilinogen, UA 0.2 0.0 - 1.0 mg/dL   Nitrite NEGATIVE NEGATIVE   Leukocytes, UA NEGATIVE NEGATIVE  Wet prep, genital     Status:  Abnormal   Collection Time: 03/14/14 12:35 PM  Result Value Ref Range   Yeast Wet Prep HPF POC NONE SEEN NONE SEEN   Trich, Wet Prep NONE SEEN NONE SEEN   Clue Cells Wet Prep HPF POC MANY (A) NONE SEEN   WBC, Wet Prep HPF POC FEW (A) NONE SEEN  CBC     Status: Abnormal   Collection Time: 03/14/14  1:15 PM  Result Value Ref Range   WBC 10.0 4.0 - 10.5 K/uL   RBC 4.01 3.87 - 5.11 MIL/uL   Hemoglobin 11.8 (L) 12.0 - 15.0 g/dL   HCT 40.934.9 (L) 81.136.0 - 91.446.0 %   MCV 87.0 78.0 - 100.0 fL   MCH 29.4 26.0 - 34.0 pg   MCHC 33.8 30.0 - 36.0 g/dL   RDW 78.212.9 95.611.5 - 21.315.5 %  Platelets 225 150 - 400 K/uL  hCG, quantitative, pregnancy     Status: Abnormal   Collection Time: 03/14/14  1:15 PM  Result Value Ref Range   hCG, Beta Chain, Quant, S 29500 (H) <5 mIU/mL   US Ob Comp Less 14 Wks  03/14/2014   CLINICAL DATA:  Pregnant, right abdominal pain, unsure LMP  EXAM: OBSTETRIC <14 WK [place] AND TRANSVAGINAL OB US  TECHNIQUE: Both transabdominal and transvaginal ultrasound examinations were performed for complete evaluation of the gestation as well as the maternal uterus, adnexal regions, and pelvic cul-de-sac. Transvaginal technique was performed to assess early pregnancy.  COMPARISON:  None.  FINDINGS: Intrauterine gestational sac: Visualized/normal in shape.  Yolk sac:  Present  Embryo:  Not visualized  MSD:  14.1  mm   6 w   2  d  [place] EDC: 11/05/2014  Maternal uterus/adnexae: No subchorionic hemorrhage.  Right ovary measures 3.9 x 2.7 x 3.1 cm and is notable for a corpus luteal cyst.  Left ovary measures 2.7 x 1.6 x 1.6 cm in is within normal limits.  Small volume pelvic ascites.  IMPRESSION: Single intrauterine gestation with yolk sac but no fetal pole, measuring 6 weeks 2 days by mean sac diameter.  Serial beta HCG is suggested. Consider follow-up pelvic ultrasound in 14 days to confirm viability as clinically warranted.   Electronically Signed   By: Charline Bills M.D.   On: 03/14/2014 14:58   US Ob  Transvaginal  03/14/2014   CLINICAL DATA:  Pregnant, right abdominal pain, unsure LMP  EXAM: OBSTETRIC <14 WK [place] AND TRANSVAGINAL OB US  TECHNIQUE: Both transabdominal and transvaginal ultrasound examinations were performed for complete evaluation of the gestation as well as the maternal uterus, adnexal regions, and pelvic cul-de-sac. Transvaginal technique was performed to assess early pregnancy.  COMPARISON:  None.  FINDINGS: Intrauterine gestational sac: Visualized/normal in shape.  Yolk sac:  Present  Embryo:  Not visualized  MSD:  14.1  mm   6 w   2  d  [place] EDC: 11/05/2014  Maternal uterus/adnexae: No subchorionic hemorrhage.  Right ovary measures 3.9 x 2.7 x 3.1 cm and is notable for a corpus luteal cyst.  Left ovary measures 2.7 x 1.6 x 1.6 cm in is within normal limits.  Small volume pelvic ascites.  IMPRESSION: Single intrauterine gestation with yolk sac but no fetal pole, measuring 6 weeks 2 days by mean sac diameter.  Serial beta HCG is suggested. Consider follow-up pelvic ultrasound in 14 days to confirm viability as clinically warranted.   Electronically Signed   By: Charline Bills M.D.   On: 03/14/2014 14:58    MDM CBC, UA, Upt, GC Wet Prep and [place] r/o ectopic. Pt has Type and Screen on file.  Assessment and Plan  A: Confirmed IUP with yolk sac  P: Pt AWOL, had left prior to receiving discharge instructions  Pt called and provided Wet prep and [place] results. Rx Flagyl  PO BID x 7d for BV, educated on side effects/ prenatal vitamins  Educated to D/C omeprazole use, can use OTC Zantac or tums for acid reflux. F/U with HD to establish OB Care Return if experiences increased vaginal bleeding, fever/chills, increased abdominal pain.  Micker Samios, FNP-S  Evaluation and management procedures were performed by the NP student under my supervision and collaboration. I have reviewed the note and chart, and I agree with the management and plan.  Iona Hansen Rasch, NP 03/14/2014 3:29  PM

## 2014-03-15 LAB — GC/CHLAMYDIA PROBE AMP
CT Probe RNA: NEGATIVE
GC Probe RNA: NEGATIVE

## 2014-03-15 LAB — HIV ANTIBODY (ROUTINE TESTING W REFLEX)
HIV 1/HIV 2 AB: NONREACTIVE
HIV 1/O/2 Abs-Index Value: <1 (ref <1.00)

## 2014-04-28 ENCOUNTER — Inpatient Hospital Stay (HOSPITAL_COMMUNITY)
Admission: AD | Admit: 2014-04-28 | Discharge: 2014-04-28 | Discharge status: Against Medical Advice | Payer: Medicaid Other | Source: Ambulatory Visit | Attending: Obstetrics and Gynecology | Admitting: Obstetrics and Gynecology

## 2014-04-28 ENCOUNTER — Encounter (HOSPITAL_COMMUNITY): Payer: Self-pay | Admitting: *Deleted

## 2014-04-28 DIAGNOSIS — Z3A13 13 weeks gestation of pregnancy: Secondary | ICD-10-CM | POA: Insufficient documentation

## 2014-04-28 DIAGNOSIS — O9989 Other specified diseases and conditions complicating pregnancy, childbirth and the puerperium: Secondary | ICD-10-CM | POA: Diagnosis not present

## 2014-04-28 DIAGNOSIS — R109 Unspecified abdominal pain: Secondary | ICD-10-CM | POA: Insufficient documentation

## 2014-04-28 DIAGNOSIS — O99331 Smoking (tobacco) complicating pregnancy, first trimester: Secondary | ICD-10-CM | POA: Insufficient documentation

## 2014-04-28 HISTORY — DX: Unspecified asthma, uncomplicated: J45.909

## 2014-04-28 LAB — RAPID URINE DRUG SCREEN, HOSP PERFORMED
AMPHETAMINES: NOT DETECTED
BARBITURATES: NOT DETECTED
Benzodiazepines: NOT DETECTED
COCAINE: NOT DETECTED
Opiates: NOT DETECTED
Tetrahydrocannabinol: POSITIVE — AB

## 2014-04-28 LAB — URINE MICROSCOPIC-ADD ON

## 2014-04-28 LAB — URINALYSIS, ROUTINE W REFLEX MICROSCOPIC
BILIRUBIN URINE: NEGATIVE
Glucose, UA: NEGATIVE mg/dL
HGB URINE DIPSTICK: NEGATIVE
KETONES UR: NEGATIVE mg/dL
NITRITE: NEGATIVE
PH: 8 (ref 5.0–8.0)
Protein, ur: NEGATIVE mg/dL
SPECIFIC GRAVITY, URINE: 1.02 (ref 1.005–1.030)
UROBILINOGEN UA: 0.2 mg/dL (ref 0.0–1.0)

## 2014-04-28 NOTE — MAU Note (Signed)
Lower abd pain started last night, ate very small amount of [place] food, unsure if this is causing the pain.  Had some nausea, some diarrhea.  No vomiting.

## 2014-04-28 NOTE — MAU Note (Signed)
Pt informed L. Clemmons CNM that she wanted to sign out AMA.  AMA form taken into room, pt angry, asking just what were we going to do for her today.  Pt/s S/O encouraged her to sign the form.  Pt became angry, threw form on the bed, and left - form not signed.  Informed pt's S/O we do want to help her & to bring her back to MAU if she continues to have pain or any other concerns.

## 2014-04-28 NOTE — MAU Provider Note (Signed)
History     CSN: [id number]  Arrival date and time: 04/28/14 1017   First Provider Initiated Contact with Patient 04/28/14 1145      Chief Complaint  Patient presents with  . Abdominal Pain   HPI   [name] is a 24 y.o. female [phone] who presents with abdominal pain.  The patient ate chinese food last night and feels that this may have given her a stomach ache.  She denies vaginal bleeding, she does have nausea, and discharge. She is also very anxious because she had a 19 week loss.   The patient has repeated several times that she is very hungry and does not understand why we will not feed her or allow her to have something to eat.       OB History    Gravida Para Term Preterm AB TAB SAB Ectopic Multiple Living   0 0 0 0 0 0 4      Past Medical History  Diagnosis Date  . Chlamydia Jan 2014  . Abnormal Pap smear   . Asthma     Past Surgical History  Procedure Laterality Date  . Wisdom tooth extraction      Family History  Problem Relation Age of Onset  . Hypertension Mother   . Hypertension Maternal Grandmother     History  Substance Use Topics  . Smoking status: Current Every Day Smoker  . Smokeless tobacco: Never Used  . Alcohol Use: No     Comment: occasional/not since pregnancy    Allergies: No Known Allergies  Prescriptions prior to admission  Medication Sig Dispense Refill Last Dose  . calcium carbonate (TUMS - DOSED IN MG ELEMENTAL CALCIUM) 500 MG chewable tablet Chew 1 tablet by mouth 2 (two) times daily as needed for indigestion or heartburn.   Past Week at Unknown time  . albuterol (PROVENTIL HFA;VENTOLIN HFA) 108 (90 BASE) MCG/ACT inhaler Inhale 2 puffs into the lungs every 4 (four) hours as needed for wheezing. 1 Inhaler 0 rescue  . metroNIDAZOLE (FLAGYL) 500 MG tablet Take 1 tablet (500 mg total) by mouth 2 (two) times daily. (Patient not taking: Reported on 04/28/2014) 14 tablet 0   . oxyCODONE-acetaminophen (PERCOCET) 5-325  MG per tablet 1 to 2 tablets every 6 hours as needed for pain. (Patient not taking: Reported on 04/28/2014) 20 tablet 0 Past Month at Unknown time  . Prenatal Vit-Fe Fumarate-FA (PRENATAL VITAMINS) 28-0.8 MG TABS Take 1 tablet by mouth daily. (Patient not taking: Reported on 04/28/2014) 90 tablet 1    Results for orders placed or performed during the hospital encounter of 04/28/14 (from the past 48 hour(s))  Urinalysis, Routine w reflex microscopic     Status: Abnormal   Collection Time: 04/28/14 10:35 AM  Result Value Ref Range   Color, Urine YELLOW YELLOW   APPearance CLEAR CLEAR   Specific Gravity, Urine 1.020 1.005 - 1.030   pH 8.0 5.0 - 8.0   Glucose, UA NEGATIVE NEGATIVE mg/dL   Hgb urine dipstick NEGATIVE NEGATIVE   Bilirubin Urine NEGATIVE NEGATIVE   Ketones, ur NEGATIVE NEGATIVE mg/dL   Protein, ur NEGATIVE NEGATIVE mg/dL   Urobilinogen, UA 0.2 0.0 - 1.0 mg/dL   Nitrite NEGATIVE NEGATIVE   Leukocytes, UA TRACE (A) NEGATIVE  Urine microscopic-add on     Status: Abnormal   Collection Time: 04/28/14 10:35 AM  Result Value Ref Range   Squamous Epithelial / LPF MANY (A) RARE   WBC, UA 3-6 <3 WBC/hpf  Urine-Other MUCOUS PRESENT     Review of Systems  Constitutional: Negative for fever and chills.  Gastrointestinal: Positive for abdominal pain.   Physical Exam   Blood pressure 116/59, pulse 66, temperature 98.2 F (36.8 C), temperature source Oral, resp. rate 18, height 5\' 4"  (1.626 m), weight 70.308 kg (155 lb), last menstrual period 01/25/2014.  Physical Exam  Constitutional: She is oriented to person, place, and time. She appears well-developed and well-nourished.  Neurological: She is alert and oriented to person, place, and time.  Psychiatric: Her mood appears anxious. Her affect is angry, blunt and inappropriate. She is agitated and aggressive. She expresses impulsivity.    MAU Course  Procedures  None  MDM  UA  + fetal heat tones noted by RN  Patient came  out to the desk very upset that no one has been into her room. She states that no one has offered her something to eat since she has been here. The RN informed the patient that the NP would be in there shortly. I was given the message from the RN and went directly into the room.   The patient requested an US and states that she needs to the know that her baby is ok. She states that she always has to wait a long time to be seen in MAU and she is tired of waiting for care. I discussed with her that we have good fetal heart tones and reassured her that her baby was ok.  The patient became verbally upset demanding an US and another provider to see her.  I informed her that I was happy to call another provider to see her in MAU.   1150: Security called by RN  12:00 Illene BolusLori Clemmons CNM called to MAU to evaluate the patient.  Upon Evaluation of this pt. Pt was extremely agitated with the care she thought she was receiving. She was wanting an ultrasound and was not happy with the providers that she was given. I offered to take care of her and evaluate her and encouraged her to stay but the pt was irate and wanted to sign out AMA.  UDS pending   Assessment and Plan   A: Abdominal pain in pregnancy  P: Patient left AMA; prior to exam.    Ana HansenJennifer Irene Rasch, NP 04/28/2014 11:48 AM

## 2014-04-28 NOTE — MAU Note (Signed)
Yelling was heard at the desk from the pt room after NP went in room. Went to stand outside room to provide support for NP if needed. Pt yelling at NP demanding an ultrasound. NP explaining to pt that with +FHT there was no indication for an ultrasound. Pt yelling and cursing that NP is not helping her and NP is treating her poorly. Pt demanding an ultrasound and demanding that NP leave and someone else come see her if she will not give her an ultrasound. Security called to pt room because pt continuing to yell and curse.

## 2014-05-10 ENCOUNTER — Encounter (HOSPITAL_COMMUNITY): Payer: Self-pay

## 2014-05-10 ENCOUNTER — Inpatient Hospital Stay (HOSPITAL_COMMUNITY)
Admission: AD | Admit: 2014-05-10 | Discharge: 2014-05-10 | Disposition: A | Discharge status: Home / Self Care | Payer: Medicaid Other | Source: Ambulatory Visit | Attending: Obstetrics & Gynecology | Admitting: Obstetrics & Gynecology

## 2014-05-10 ENCOUNTER — Inpatient Hospital Stay (HOSPITAL_COMMUNITY): Payer: Medicaid Other

## 2014-05-10 DIAGNOSIS — Z87891 Personal history of nicotine dependence: Secondary | ICD-10-CM | POA: Diagnosis not present

## 2014-05-10 DIAGNOSIS — O26899 Other specified pregnancy related conditions, unspecified trimester: Secondary | ICD-10-CM | POA: Insufficient documentation

## 2014-05-10 DIAGNOSIS — Z3A15 15 weeks gestation of pregnancy: Secondary | ICD-10-CM | POA: Diagnosis not present

## 2014-05-10 DIAGNOSIS — O4692 Antepartum hemorrhage, unspecified, second trimester: Secondary | ICD-10-CM

## 2014-05-10 DIAGNOSIS — O4690 Antepartum hemorrhage, unspecified, unspecified trimester: Secondary | ICD-10-CM | POA: Insufficient documentation

## 2014-05-10 DIAGNOSIS — R109 Unspecified abdominal pain: Secondary | ICD-10-CM | POA: Diagnosis not present

## 2014-05-10 DIAGNOSIS — O209 Hemorrhage in early pregnancy, unspecified: Secondary | ICD-10-CM | POA: Insufficient documentation

## 2014-05-10 DIAGNOSIS — Z3A14 14 weeks gestation of pregnancy: Secondary | ICD-10-CM

## 2014-05-10 LAB — URINE MICROSCOPIC-ADD ON

## 2014-05-10 LAB — URINALYSIS, ROUTINE W REFLEX MICROSCOPIC
BILIRUBIN URINE: NEGATIVE
Glucose, UA: NEGATIVE mg/dL
KETONES UR: NEGATIVE mg/dL
Leukocytes, UA: NEGATIVE
NITRITE: NEGATIVE
PH: 7.5 (ref 5.0–8.0)
PROTEIN: NEGATIVE mg/dL
Specific Gravity, Urine: 1.02 (ref 1.005–1.030)
Urobilinogen, UA: 0.2 mg/dL (ref 0.0–1.0)

## 2014-05-10 LAB — CBC
HEMATOCRIT: 31.7 % — AB (ref 36.0–46.0)
Hemoglobin: 10.6 g/dL — ABNORMAL LOW (ref 12.0–15.0)
MCH: 29.4 pg (ref 26.0–34.0)
MCHC: 33.4 g/dL (ref 30.0–36.0)
MCV: 88.1 fL (ref 78.0–100.0)
Platelets: 202 10*3/uL (ref 150–400)
RBC: 3.6 MIL/uL — ABNORMAL LOW (ref 3.87–5.11)
RDW: 12.7 % (ref 11.5–15.5)
WBC: 9 10*3/uL (ref 4.0–10.5)

## 2014-05-10 LAB — WET PREP, GENITAL
Clue Cells Wet Prep HPF POC: NONE SEEN
TRICH WET PREP: NONE SEEN
Yeast Wet Prep HPF POC: NONE SEEN

## 2014-05-10 LAB — OB RESULTS CONSOLE HIV ANTIBODY (ROUTINE TESTING): HIV: NONREACTIVE

## 2014-05-10 NOTE — MAU Note (Signed)
Pt reports she went to restroom about 45 min ago and had some "dark red" bleeding noted in the toilet and upon wiping.  Pt reports light cramping.  Pt reports when she voided in MAU restroom she just noted some bleeding upon wiping, but states she also did not void much.

## 2014-05-10 NOTE — MAU Provider Note (Signed)
History     CSN: [id number]  Arrival date and time: 05/10/14 2047   First Provider Initiated Contact with Patient 05/10/14 2141      Chief Complaint  Patient presents with  . Vaginal Bleeding   HPI  Ana [name] is a 24 y.o. [id number] at 4529w0d who presents today with bleeding and cramping. She states that the bleeding started about 2045. She states that she had intercourse earlier in the day. She has not started prenatal care at this time.   Past Medical History  Diagnosis Date  . Chlamydia Jan 2014  . Abnormal Pap smear   . Asthma     Past Surgical History  Procedure Laterality Date  . Wisdom tooth extraction      Family History  Problem Relation Age of Onset  . Hypertension Mother   . Hypertension Maternal Grandmother     History  Substance Use Topics  . Smoking status: Former Smoker    Quit date: 04/25/2014  . Smokeless tobacco: Never Used  . Alcohol Use: No     Comment: occasional/not since pregnancy    Allergies: No Known Allergies  Prescriptions prior to admission  Medication Sig Dispense Refill Last Dose  . albuterol (PROVENTIL HFA;VENTOLIN HFA) 108 (90 BASE) MCG/ACT inhaler Inhale 2 puffs into the lungs every 4 (four) hours as needed for wheezing. 1 Inhaler 0 rescue  . calcium carbonate (TUMS - DOSED IN MG ELEMENTAL CALCIUM) 500 MG chewable tablet Chew 1 tablet by mouth 2 (two) times daily as needed for indigestion or heartburn.   Past Week at Unknown time  . metroNIDAZOLE (FLAGYL) 500 MG tablet Take 1 tablet (500 mg total) by mouth 2 (two) times daily. (Patient not taking: Reported on 04/28/2014) 14 tablet 0   . oxyCODONE-acetaminophen (PERCOCET) 5-325 MG per tablet 1 to 2 tablets every 6 hours as needed for pain. (Patient not taking: Reported on 04/28/2014) 20 tablet 0 Past Month at Unknown time  . Prenatal Vit-Fe Fumarate-FA (PRENATAL VITAMINS) 28-0.8 MG TABS Take 1 tablet by mouth daily. (Patient not taking: Reported on 04/28/2014) 90 tablet 1      ROS Physical Exam   Blood pressure 132/66, pulse 84, temperature 98.3 F (36.8 C), temperature source Oral, resp. rate 18, height 5\' 4"  (1.626 m), weight 73.029 kg (161 lb), last menstrual period 01/25/2014.  Physical Exam  Nursing note and vitals reviewed. Constitutional: She is oriented to person, place, and time. She appears well-developed and well-nourished. No distress.  Cardiovascular: Normal rate.   Respiratory: Effort normal.  GI: Soft. There is no tenderness. There is no rebound.  Genitourinary:   External: no lesion Vagina: small amount of bright red blood seen  Cervix: pink, smooth, visually closed, will defer digital exam until previa is ruled out.  Uterus: AGA  FHT with doppler   Neurological: She is alert and oriented to person, place, and time.  Skin: Skin is warm and dry.  Psychiatric: She has a normal mood and affect.    MAU Course  Procedures  Results for orders placed or performed during the hospital encounter of 05/10/14 (from the past 24 hour(s))  Urinalysis, Routine w reflex microscopic     Status: Abnormal   Collection Time: 05/10/14  8:55 PM  Result Value Ref Range   Color, Urine YELLOW YELLOW   APPearance CLEAR CLEAR   Specific Gravity, Urine 1.020 1.005 - 1.030   pH 7.5 5.0 - 8.0   Glucose, UA NEGATIVE NEGATIVE mg/dL   Hgb urine dipstick LARGE (  A) NEGATIVE   Bilirubin Urine NEGATIVE NEGATIVE   Ketones, ur NEGATIVE NEGATIVE mg/dL   Protein, ur NEGATIVE NEGATIVE mg/dL   Urobilinogen, UA 0.2 0.0 - 1.0 mg/dL   Nitrite NEGATIVE NEGATIVE   Leukocytes, UA NEGATIVE NEGATIVE  Urine microscopic-add on     Status: Abnormal   Collection Time: 05/10/14  8:55 PM  Result Value Ref Range   Squamous Epithelial / LPF FEW (A) RARE   WBC, UA 0-2 <3 WBC/hpf   RBC / HPF 3-6 <3 RBC/hpf   Bacteria, UA FEW (A) RARE   Urine-Other MUCOUS PRESENT   Wet prep, genital     Status: Abnormal   Collection Time: 05/10/14  9:45 PM  Result Value Ref Range   Yeast Wet  Prep HPF POC NONE SEEN NONE SEEN   Trich, Wet Prep NONE SEEN NONE SEEN   Clue Cells Wet Prep HPF POC NONE SEEN NONE SEEN   WBC, Wet Prep HPF POC FEW (A) NONE SEEN  CBC     Status: Abnormal   Collection Time: 05/10/14  9:52 PM  Result Value Ref Range   WBC 9.0 4.0 - 10.5 K/uL   RBC 3.60 (L) 3.87 - 5.11 MIL/uL   Hemoglobin 10.6 (L) 12.0 - 15.0 g/dL   HCT 09.8 (L) 11.9 - 14.7 %   MCV 88.1 78.0 - 100.0 fL   MCH 29.4 26.0 - 34.0 pg   MCHC 33.4 30.0 - 36.0 g/dL   RDW 82.9 56.2 - 13.0 %   Platelets 202 150 - 400 K/uL   OB US: No previa, small SCH, fluid WNL, 3.5 cm cervical length   Assessment and Plan   1. Vaginal bleeding in pregnancy, second trimester   2. Abdominal pain affecting pregnancy    DC home Bleeding precautions  Return to MAU as needed [facility]   Follow-up Information    Follow up with [facility].   Specialty:  Obstetrics and Gynecology   Why:  MARCH 8 at 1:00    Contact information:   [address] [place] [address] [phone]       Tawnya Crook 05/10/2014, 9:43 PM

## 2014-05-10 NOTE — MAU Note (Signed)
Pt presents with complaint of bleeding for the last 30 minutes, lower abd cramping.

## 2014-05-10 NOTE — Discharge Instructions (Signed)
Second Trimester of Pregnancy The second trimester is from week 13 through week 28, months 4 through 6. The second trimester is often a time when you feel your best. Your body has also adjusted to being pregnant, and you begin to feel better physically. Usually, morning sickness has lessened or quit completely, you may have more energy, and you may have an increase in appetite. The second trimester is also a time when the fetus is growing rapidly. At the end of the sixth month, the fetus is about 9 inches long and weighs about 1 pounds. You will likely begin to feel the baby move (quickening) between 18 and 20 weeks of the pregnancy. BODY CHANGES Your body goes through many changes during pregnancy. The changes vary from woman to woman.   Your weight will continue to increase. You will notice your lower abdomen bulging out.  You may begin to get stretch marks on your hips, abdomen, and breasts.  You may develop headaches that can be relieved by medicines approved by your health care provider.  You may urinate more often because the fetus is pressing on your bladder.  You may develop or continue to have heartburn as a result of your pregnancy.  You may develop constipation because certain hormones are causing the muscles that push waste through your intestines to slow down.  You may develop hemorrhoids or swollen, bulging veins (varicose veins).  You may have back pain because of the weight gain and pregnancy hormones relaxing your joints between the bones in your pelvis and as a result of a shift in weight and the muscles that support your balance.  Your breasts will continue to grow and be tender.  Your gums may bleed and may be sensitive to brushing and flossing.  Dark spots or blotches (chloasma, mask of pregnancy) may develop on your face. This will likely fade after the baby is born.  A dark line from your belly button to the pubic area (linea nigra) may appear. This will likely fade  after the baby is born.  You may have changes in your hair. These can include thickening of your hair, rapid growth, and changes in texture. Some women also have hair loss during or after pregnancy, or hair that feels dry or thin. Your hair will most likely return to normal after your baby is born. WHAT TO EXPECT AT YOUR PRENATAL VISITS During a routine prenatal visit:  You will be weighed to make sure you and the fetus are growing normally.  Your blood pressure will be taken.  Your abdomen will be measured to track your baby's growth.  The fetal heartbeat will be listened to.  Any test results from the previous visit will be discussed. Your health care provider may ask you:  How you are feeling.  If you are feeling the baby move.  If you have had any abnormal symptoms, such as leaking fluid, bleeding, severe headaches, or abdominal cramping.  If you have any questions. Other tests that may be performed during your second trimester include:  Blood tests that check for:  Low iron levels (anemia).  Gestational diabetes (between 24 and 28 weeks).  Rh antibodies.  Urine tests to check for infections, diabetes, or protein in the urine.  An ultrasound to confirm the proper growth and development of the baby.  An amniocentesis to check for possible genetic problems.  Fetal screens for spina bifida and Down syndrome. HOME CARE INSTRUCTIONS   Avoid all smoking, herbs, alcohol, and unprescribed   drugs. These chemicals affect the formation and growth of the baby.  Follow your health care provider's instructions regarding medicine use. There are medicines that are either safe or unsafe to take during pregnancy.  Exercise only as directed by your health care provider. Experiencing uterine cramps is a good sign to stop exercising.  Continue to eat regular, healthy meals.  Wear a good support bra for breast tenderness.  Do not use hot tubs, steam rooms, or saunas.  Wear your  seat belt at all times when driving.  Avoid raw meat, uncooked cheese, cat litter boxes, and soil used by cats. These carry germs that can cause birth defects in the baby.  Take your prenatal vitamins.  Try taking a stool softener (if your health care provider approves) if you develop constipation. Eat more high-fiber foods, such as fresh vegetables or fruit and whole grains. Drink plenty of fluids to keep your urine clear or pale yellow.  Take warm sitz baths to soothe any pain or discomfort caused by hemorrhoids. Use hemorrhoid cream if your health care provider approves.  If you develop varicose veins, wear support hose. Elevate your feet for 15 minutes, 3-4 times a day. Limit salt in your diet.  Avoid heavy lifting, wear low heel shoes, and practice good posture.  Rest with your legs elevated if you have leg cramps or low back pain.  Visit your dentist if you have not gone yet during your pregnancy. Use a soft toothbrush to brush your teeth and be gentle when you floss.  A sexual relationship may be continued unless your health care provider directs you otherwise.  Continue to go to all your prenatal visits as directed by your health care provider. SEEK MEDICAL CARE IF:   You have dizziness.  You have mild pelvic cramps, pelvic pressure, or nagging pain in the abdominal area.  You have persistent nausea, vomiting, or diarrhea.  You have a bad smelling vaginal discharge.  You have pain with urination. SEEK IMMEDIATE MEDICAL CARE IF:   You have a fever.  You are leaking fluid from your vagina.  You have spotting or bleeding from your vagina.  You have severe abdominal cramping or pain.  You have rapid weight gain or loss.  You have shortness of breath with chest pain.  You notice sudden or extreme swelling of your face, hands, ankles, feet, or legs.  You have not felt your baby move in over an hour.  You have severe headaches that do not go away with  medicine.  You have vision changes. Document Released: 02/17/2001 Document Revised: 02/28/2013 Document Reviewed: 04/26/2012 Exit[employer] Patient Information 2015 [employer]. This information is not intended to replace advice given to you by your health care provider. Make sure you discuss any questions you have with your health care provider.  

## 2014-05-11 DIAGNOSIS — Z3A15 15 weeks gestation of pregnancy: Secondary | ICD-10-CM | POA: Insufficient documentation

## 2014-05-11 DIAGNOSIS — R109 Unspecified abdominal pain: Secondary | ICD-10-CM

## 2014-05-11 DIAGNOSIS — O4690 Antepartum hemorrhage, unspecified, unspecified trimester: Secondary | ICD-10-CM | POA: Insufficient documentation

## 2014-05-11 DIAGNOSIS — O26899 Other specified pregnancy related conditions, unspecified trimester: Secondary | ICD-10-CM | POA: Insufficient documentation

## 2014-05-12 LAB — HIV ANTIBODY (ROUTINE TESTING W REFLEX): HIV Screen 4th Generation wRfx: NONREACTIVE

## 2014-05-15 ENCOUNTER — Other Ambulatory Visit (HOSPITAL_COMMUNITY)
Admission: RE | Admit: 2014-05-15 | Discharge: 2014-05-15 | Disposition: A | Discharge status: Home / Self Care | Payer: Medicaid Other | Source: Ambulatory Visit | Attending: Obstetrics & Gynecology | Admitting: Obstetrics & Gynecology

## 2014-05-15 ENCOUNTER — Ambulatory Visit (INDEPENDENT_AMBULATORY_CARE_PROVIDER_SITE_OTHER): Payer: Medicaid Other | Admitting: Obstetrics & Gynecology

## 2014-05-15 ENCOUNTER — Encounter: Payer: Self-pay | Admitting: Obstetrics & Gynecology

## 2014-05-15 VITALS — BP 117/56 | HR 73 | Temp 98.7°F | Wt 160.8 lb

## 2014-05-15 DIAGNOSIS — Z23 Encounter for immunization: Secondary | ICD-10-CM

## 2014-05-15 DIAGNOSIS — Z124 Encounter for screening for malignant neoplasm of cervix: Secondary | ICD-10-CM

## 2014-05-15 DIAGNOSIS — Z113 Encounter for screening for infections with a predominantly sexual mode of transmission: Secondary | ICD-10-CM | POA: Diagnosis present

## 2014-05-15 DIAGNOSIS — J4520 Mild intermittent asthma, uncomplicated: Secondary | ICD-10-CM

## 2014-05-15 DIAGNOSIS — Z01419 Encounter for gynecological examination (general) (routine) without abnormal findings: Secondary | ICD-10-CM | POA: Diagnosis present

## 2014-05-15 DIAGNOSIS — O99330 Smoking (tobacco) complicating pregnancy, unspecified trimester: Secondary | ICD-10-CM | POA: Insufficient documentation

## 2014-05-15 DIAGNOSIS — Z3492 Encounter for supervision of normal pregnancy, unspecified, second trimester: Secondary | ICD-10-CM | POA: Diagnosis not present

## 2014-05-15 DIAGNOSIS — O09292 Supervision of pregnancy with other poor reproductive or obstetric history, second trimester: Secondary | ICD-10-CM

## 2014-05-15 DIAGNOSIS — O09299 Supervision of pregnancy with other poor reproductive or obstetric history, unspecified trimester: Secondary | ICD-10-CM | POA: Insufficient documentation

## 2014-05-15 DIAGNOSIS — Z118 Encounter for screening for other infectious and parasitic diseases: Secondary | ICD-10-CM

## 2014-05-15 LAB — POCT URINALYSIS DIP (DEVICE)
BILIRUBIN URINE: NEGATIVE
GLUCOSE, UA: NEGATIVE mg/dL
HGB URINE DIPSTICK: NEGATIVE
Ketones, ur: NEGATIVE mg/dL
Nitrite: NEGATIVE
Protein, ur: NEGATIVE mg/dL
Specific Gravity, Urine: 1.025 (ref 1.005–1.030)
UROBILINOGEN UA: 0.2 mg/dL (ref 0.0–1.0)
pH: 7 (ref 5.0–8.0)

## 2014-05-15 NOTE — Progress Notes (Signed)
  Subjective:    Ana [name] is being seen today for her first obstetrical visit.  This is not a planned pregnancy. She is at 253w5d gestation. Her obstetrical history is significant for smoker and prev PROM and delivery at 10819 6/7 . Relationship with FOB: significant other, living together. Patient does intend to breast feed. Pregnancy history fully reviewed.  Patient reports no complaints and had bleeding prev seen in MAU and it resolved.  Review of Systems:   Review of Systems  Objective:     BP 117/56 mmHg  Pulse 73  Temp(Src) 98.7 F (37.1 C)  Wt 160 lb 12.8 oz (72.938 kg)  LMP 01/25/2014 (Exact Date) Physical Exam  Exam Pt in NAD Lungs: CTA CV: RRR Abd; soft, NT, ND  GU: EGBUS: no lesions Vagina: no blood in vault Cervix: no lesion; no mucopurulent d/c Uterus: 18-19 weeks sized Adnexa: no masses; non tender       Assessment:    Pregnancy: [id number] Patient Active Problem List   Diagnosis Date Noted  . History of preterm PROM in previous pregnancy, currently pregnant 05/15/2014  . Maternal tobacco use, antepartum 05/15/2014  . [redacted] weeks gestation of pregnancy   . Abdominal pain affecting pregnancy   . Vaginal bleeding in pregnancy   . Intrauterine fetal death, less than 22 weeks 08/30/2012  . ? Previable Premature rupture of membranes in pregnancy, antepartum 08/25/2012       Plan:     Initial labs drawn. Prenatal vitamins. Problem list reviewed and updated. AFP3 discussed: requested. Role of ultrasound in pregnancy discussed; fetal survey: requested. Amniocentesis discussed: not indicated. Follow up in 4 weeks. S/p flu vac today. 60% of 30 min visit spent on counseling and coordination of care.  Begin 17-OH-P  HARRAWAY-SMITH, Thersa Mohiuddin 05/15/2014

## 2014-05-15 NOTE — Progress Notes (Signed)
[facility]

## 2014-05-15 NOTE — Patient Instructions (Signed)
Second Trimester of Pregnancy The second trimester is from week 13 through week 28, months 4 through 6. The second trimester is often a time when you feel your best. Your body has also adjusted to being pregnant, and you begin to feel better physically. Usually, morning sickness has lessened or quit completely, you may have more energy, and you may have an increase in appetite. The second trimester is also a time when the fetus is growing rapidly. At the end of the sixth month, the fetus is about 9 inches long and weighs about 1 pounds. You will likely begin to feel the baby move (quickening) between 18 and 20 weeks of the pregnancy. BODY CHANGES Your body goes through many changes during pregnancy. The changes vary from woman to woman.   Your weight will continue to increase. You will notice your lower abdomen bulging out.  You may begin to get stretch marks on your hips, abdomen, and breasts.  You may develop headaches that can be relieved by medicines approved by your health care provider.  You may urinate more often because the fetus is pressing on your bladder.  You may develop or continue to have heartburn as a result of your pregnancy.  You may develop constipation because certain hormones are causing the muscles that push waste through your intestines to slow down.  You may develop hemorrhoids or swollen, bulging veins (varicose veins).  You may have back pain because of the weight gain and pregnancy hormones relaxing your joints between the bones in your pelvis and as a result of a shift in weight and the muscles that support your balance.  Your breasts will continue to grow and be tender.  Your gums may bleed and may be sensitive to brushing and flossing.  Dark spots or blotches (chloasma, mask of pregnancy) may develop on your face. This will likely fade after the baby is born.  A dark line from your belly button to the pubic area (linea nigra) may appear. This will likely fade  after the baby is born.  You may have changes in your hair. These can include thickening of your hair, rapid growth, and changes in texture. Some women also have hair loss during or after pregnancy, or hair that feels dry or thin. Your hair will most likely return to normal after your baby is born. WHAT TO EXPECT AT YOUR PRENATAL VISITS During a routine prenatal visit:  You will be weighed to make sure you and the fetus are growing normally.  Your blood pressure will be taken.  Your abdomen will be measured to track your baby's growth.  The fetal heartbeat will be listened to.  Any test results from the previous visit will be discussed. Your health care provider may ask you:  How you are feeling.  If you are feeling the baby move.  If you have had any abnormal symptoms, such as leaking fluid, bleeding, severe headaches, or abdominal cramping.  If you have any questions. Other tests that may be performed during your second trimester include:  Blood tests that check for:  Low iron levels (anemia).  Gestational diabetes (between 24 and 28 weeks).  Rh antibodies.  Urine tests to check for infections, diabetes, or protein in the urine.  An ultrasound to confirm the proper growth and development of the baby.  An amniocentesis to check for possible genetic problems.  Fetal screens for spina bifida and Down syndrome. HOME CARE INSTRUCTIONS   Avoid all smoking, herbs, alcohol, and unprescribed   drugs. These chemicals affect the formation and growth of the baby.  Follow your health care provider's instructions regarding medicine use. There are medicines that are either safe or unsafe to take during pregnancy.  Exercise only as directed by your health care provider. Experiencing uterine cramps is a good sign to stop exercising.  Continue to eat regular, healthy meals.  Wear a good support bra for breast tenderness.  Do not use hot tubs, steam rooms, or saunas.  Wear your  seat belt at all times when driving.  Avoid raw meat, uncooked cheese, cat litter boxes, and soil used by cats. These carry germs that can cause birth defects in the baby.  Take your prenatal vitamins.  Try taking a stool softener (if your health care provider approves) if you develop constipation. Eat more high-fiber foods, such as fresh vegetables or fruit and whole grains. Drink plenty of fluids to keep your urine clear or pale yellow.  Take warm sitz baths to soothe any pain or discomfort caused by hemorrhoids. Use hemorrhoid cream if your health care provider approves.  If you develop varicose veins, wear support hose. Elevate your feet for 15 minutes, 3-4 times a day. Limit salt in your diet.  Avoid heavy lifting, wear low heel shoes, and practice good posture.  Rest with your legs elevated if you have leg cramps or low back pain.  Visit your dentist if you have not gone yet during your pregnancy. Use a soft toothbrush to brush your teeth and be gentle when you floss.  A sexual relationship may be continued unless your health care provider directs you otherwise.  Continue to go to all your prenatal visits as directed by your health care provider. SEEK MEDICAL CARE IF:   You have dizziness.  You have mild pelvic cramps, pelvic pressure, or nagging pain in the abdominal area.  You have persistent nausea, vomiting, or diarrhea.  You have a bad smelling vaginal discharge.  You have pain with urination. SEEK IMMEDIATE MEDICAL CARE IF:   You have a fever.  You are leaking fluid from your vagina.  You have spotting or bleeding from your vagina.  You have severe abdominal cramping or pain.  You have rapid weight gain or loss.  You have shortness of breath with chest pain.  You notice sudden or extreme swelling of your face, hands, ankles, feet, or legs.  You have not felt your baby move in over an hour.  You have severe headaches that do not go away with  medicine.  You have vision changes. Document Released: 02/17/2001 Document Revised: 02/28/2013 Document Reviewed: 04/26/2012 Exit[employer] Patient Information 2015 [employer]. This information is not intended to replace advice given to you by your health care provider. Make sure you discuss any questions you have with your health care provider.  

## 2014-05-15 NOTE — Progress Notes (Signed)
Initial prenatal visit New OB Educational material given Flu vaccine given 05/15/14 ? Pt states that she was told that you would need 17 P injection with next pregnancy due to pregnancy loss.

## 2014-05-15 NOTE — Addendum Note (Signed)
Addended by: Faythe CasaBELLAMY, Klinton Candelas M on: 05/15/2014 05:07 PM   Modules accepted: Orders

## 2014-05-16 ENCOUNTER — Telehealth: Payer: Self-pay | Admitting: *Deleted

## 2014-05-16 LAB — PRENATAL PROFILE (SOLSTAS)
Antibody Screen: NEGATIVE
BASOS ABS: 0 10*3/uL (ref 0.0–0.1)
Basophils Relative: 0 % (ref 0–1)
EOS PCT: 2 % (ref 0–5)
Eosinophils Absolute: 0.2 10*3/uL (ref 0.0–0.7)
HEMATOCRIT: 34.5 % — AB (ref 36.0–46.0)
HIV 1&2 Ab, 4th Generation: NONREACTIVE
Hemoglobin: 11.1 g/dL — ABNORMAL LOW (ref 12.0–15.0)
Hepatitis B Surface Ag: NEGATIVE
Lymphocytes Relative: 21 % (ref 12–46)
Lymphs Abs: 2 10*3/uL (ref 0.7–4.0)
MCH: 29 pg (ref 26.0–34.0)
MCHC: 32.2 g/dL (ref 30.0–36.0)
MCV: 90.1 fL (ref 78.0–100.0)
MONO ABS: 0.5 10*3/uL (ref 0.1–1.0)
MPV: 10.3 fL (ref 8.6–12.4)
Monocytes Relative: 5 % (ref 3–12)
Neutro Abs: 7 10*3/uL (ref 1.7–7.7)
Neutrophils Relative %: 72 % (ref 43–77)
PLATELETS: 253 10*3/uL (ref 150–400)
RBC: 3.83 MIL/uL — AB (ref 3.87–5.11)
RDW: 13.3 % (ref 11.5–15.5)
Rh Type: POSITIVE
Rubella: 3.82 Index — ABNORMAL HIGH (ref <0.90)
WBC: 9.7 10*3/uL (ref 4.0–10.5)

## 2014-05-16 NOTE — Telephone Encounter (Signed)
Pt left message stating that she was seen yesterday for Pap smear and is having spotting today. She wants to know if this is normal or if she needs to come in and be seen. I called pt back and left message stating that her sx are normal and do not require a visit. She may call back if she has additional questions.

## 2014-05-17 LAB — HEMOGLOBINOPATHY EVALUATION
HGB S QUANTITAION: 0 %
Hemoglobin Other: 0 %
Hgb A2 Quant: 2.7 % (ref 2.2–3.2)
Hgb A: 97.3 % (ref 96.8–97.8)
Hgb F Quant: 0 % (ref 0.0–2.0)

## 2014-05-17 LAB — CULTURE, OB URINE: Colony Count: 35000

## 2014-05-17 LAB — CYTOLOGY - PAP

## 2014-05-22 ENCOUNTER — Encounter: Payer: Self-pay | Admitting: Obstetrics & Gynecology

## 2014-05-22 ENCOUNTER — Encounter: Payer: Self-pay | Admitting: Family Medicine

## 2014-05-22 DIAGNOSIS — O0990 Supervision of high risk pregnancy, unspecified, unspecified trimester: Secondary | ICD-10-CM | POA: Insufficient documentation

## 2014-05-22 DIAGNOSIS — J4520 Mild intermittent asthma, uncomplicated: Secondary | ICD-10-CM | POA: Insufficient documentation

## 2014-05-23 ENCOUNTER — Telehealth: Payer: Self-pay | Admitting: *Deleted

## 2014-05-23 NOTE — Telephone Encounter (Addendum)
Pt left message stating that she is checking on her 17P medication. She is also having spotting again and wants to know if she needs to be seen. I called pt and informed her that we have not yet received the 17p medication. Pt became irate and started yelling. She said she already lost one baby and if she loses this one because of getting her medication late, there will be a lawsuit. She stated that [facility] and [facility] said the pharmacy would be calling her but they never did. I advised pt that she was not able to begin the injections until [redacted] wks gestation which she is now. I will call and check on her medication and then call her back with additional information. The medication can be started as soon as it arrives. I also advised pt that she does not need to be seen for spotting. She should come to the hospital if she has heavy bleeding and/or severe abdominal or pelvic pain. Pt then stated, "So cramping and spotting is not urgent?" I told her no and advised that she rest and take tylenol for the cramping. If she is uneasy or feels she needs to be seen she can go to MAU for evaluation. Pt voiced understanding.   1340  Called pt and left message that I [facility]. Her prescription request has been approved. After the representative contacted the compounding pharmacy that will be providing the 17P, it was learned that they did not receive the [facility]. The order has been re-faxed. Pt should be receiving a call from the compounding pharmacy. If she has not received a call by Friday, please call us back and let us know. I will also be checking on the status of the order on Friday. We should have the medication by the beginning of next week and can begin her injections. She will receive a call from our staff as soon as the medication has been received.   3/22  1100  Called [facility]  to inquire about 17P as it has not yet been received in our clinic. The representative checked with the compounding pharmacy and once again, they had not received the faxed order. [facility], the last representative that I spoke with on 3/16 had documented an incorrect fax number where the order was sent. I was assured today that the order would be faxed again and the representative from the compounding pharmacy Ver[facility](Leslie) stated she would process the order today. I was given the telephone number of the compounding pharmacy [phone] and will check with [facility] AuLeslie this afternoon regarding delivery date of the medication.   3/22  1355  Called compounding pharmacy and was told that 17P medication is ready to be sent out today. It will arrive by close of business tomorrow. I called pt and informed her that she can have her initial injection on Thursday 3/24. She voiced understanding and agreed to appt @ 1600. Message sent to Admin pool to schedule appt as stated.

## 2014-05-30 LAB — GC/CHLAMYDIA PROBE AMP (~~LOC~~) NOT AT ARMC
Chlamydia: NEGATIVE
Neisseria Gonorrhea: NEGATIVE

## 2014-05-31 ENCOUNTER — Inpatient Hospital Stay (HOSPITAL_COMMUNITY)
Admission: AD | Admit: 2014-05-31 | Discharge: 2014-05-31 | Disposition: A | Discharge status: Home / Self Care | Payer: Medicaid Other | Source: Ambulatory Visit | Attending: Obstetrics and Gynecology | Admitting: Obstetrics and Gynecology

## 2014-05-31 ENCOUNTER — Encounter (HOSPITAL_COMMUNITY): Payer: Self-pay | Admitting: *Deleted

## 2014-05-31 ENCOUNTER — Ambulatory Visit: Payer: Medicaid Other

## 2014-05-31 DIAGNOSIS — Z3A18 18 weeks gestation of pregnancy: Secondary | ICD-10-CM | POA: Insufficient documentation

## 2014-05-31 DIAGNOSIS — O9989 Other specified diseases and conditions complicating pregnancy, childbirth and the puerperium: Secondary | ICD-10-CM | POA: Insufficient documentation

## 2014-05-31 DIAGNOSIS — O26892 Other specified pregnancy related conditions, second trimester: Secondary | ICD-10-CM

## 2014-05-31 DIAGNOSIS — R519 Headache, unspecified: Secondary | ICD-10-CM

## 2014-05-31 DIAGNOSIS — R51 Headache: Secondary | ICD-10-CM | POA: Insufficient documentation

## 2014-05-31 DIAGNOSIS — Z87891 Personal history of nicotine dependence: Secondary | ICD-10-CM | POA: Diagnosis not present

## 2014-05-31 DIAGNOSIS — O09292 Supervision of pregnancy with other poor reproductive or obstetric history, second trimester: Secondary | ICD-10-CM | POA: Diagnosis not present

## 2014-05-31 DIAGNOSIS — O99330 Smoking (tobacco) complicating pregnancy, unspecified trimester: Secondary | ICD-10-CM

## 2014-05-31 DIAGNOSIS — O0992 Supervision of high risk pregnancy, unspecified, second trimester: Secondary | ICD-10-CM

## 2014-05-31 LAB — URINALYSIS, ROUTINE W REFLEX MICROSCOPIC
Bilirubin Urine: NEGATIVE
GLUCOSE, UA: NEGATIVE mg/dL
Hgb urine dipstick: NEGATIVE
KETONES UR: 15 mg/dL — AB
Leukocytes, UA: NEGATIVE
NITRITE: NEGATIVE
PH: 6.5 (ref 5.0–8.0)
Protein, ur: NEGATIVE mg/dL
Specific Gravity, Urine: 1.02 (ref 1.005–1.030)
Urobilinogen, UA: 0.2 mg/dL (ref 0.0–1.0)

## 2014-05-31 MED ORDER — IBUPROFEN 800 MG PO TABS
800.0000 mg | ORAL_TABLET | Freq: Once | ORAL | Status: AC
  Administered 2014-05-31: 800 mg via ORAL
  Filled 2014-05-31: qty 1
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
## 2014-05-31 NOTE — Discharge Instructions (Signed)
Concussion  A concussion, or closed-head injury, is a brain injury caused by a direct blow to the head or by a quick and sudden movement (jolt) of the head or neck. Concussions are usually not life-threatening. Even so, the effects of a concussion can be serious. If you have had a concussion before, you are more likely to experience concussion-like symptoms after a direct blow to the head.   CAUSES  · Direct blow to the head, such as from running into another player during a soccer game, being hit in a fight, or hitting your head on a hard surface.  · A jolt of the head or neck that causes the brain to move back and forth inside the skull, such as in a car crash.  SIGNS AND SYMPTOMS  The signs of a concussion can be hard to notice. Early on, they may be missed by you, family members, and health care providers. You may look fine but act or feel differently.  Symptoms are usually temporary, but they may last for days, weeks, or even longer. Some symptoms may appear right away while others may not show up for hours or days. Every head injury is different. Symptoms include:  · Mild to moderate headaches that will not go away.  · A feeling of pressure inside your head.  · Having more trouble than usual:  ¨ Learning or remembering things you have heard.  ¨ Answering questions.  ¨ Paying attention or concentrating.  ¨ Organizing daily tasks.  ¨ Making decisions and solving problems.  · Slowness in thinking, acting or reacting, speaking, or reading.  · Getting lost or being easily confused.  · Feeling tired all the time or lacking energy (fatigued).  · Feeling drowsy.  · Sleep disturbances.  ¨ Sleeping more than usual.  ¨ Sleeping less than usual.  ¨ Trouble falling asleep.  ¨ Trouble sleeping (insomnia).  · Loss of balance or feeling lightheaded or dizzy.  · Nausea or vomiting.  · Numbness or tingling.  · Increased sensitivity to:  ¨ Sounds.  ¨ Lights.  ¨ Distractions.  · Vision problems or eyes that tire  easily.  · Diminished sense of taste or smell.  · Ringing in the ears.  · Mood changes such as feeling sad or anxious.  · Becoming easily irritated or angry for little or no reason.  · Lack of motivation.  · Seeing or hearing things other people do not see or hear (hallucinations).  DIAGNOSIS  Your health care provider can usually diagnose a concussion based on a description of your injury and symptoms. He or she will ask whether you passed out (lost consciousness) and whether you are having trouble remembering events that happened right before and during your injury.  Your evaluation might include:  · A brain scan to look for signs of injury to the brain. Even if the test shows no injury, you may still have a concussion.  · Blood tests to be sure other problems are not present.  TREATMENT  · Concussions are usually treated in an emergency department, in urgent care, or at a clinic. You may need to stay in the hospital overnight for further treatment.  · Tell your health care provider if you are taking any medicines, including prescription medicines, over-the-counter medicines, and natural remedies. Some medicines, such as blood thinners (anticoagulants) and aspirin, may increase the chance of complications. Also tell your health care provider whether you have had alcohol or are taking illegal drugs. This information   may affect treatment.  · Your health care provider will send you home with important instructions to follow.  · How fast you will recover from a concussion depends on many factors. These factors include how severe your concussion is, what part of your brain was injured, your age, and how healthy you were before the concussion.  · Most people with mild injuries recover fully. Recovery can take time. In general, recovery is slower in older persons. Also, persons who have had a concussion in the past or have other medical problems may find that it takes longer to recover from their current injury.  HOME  CARE INSTRUCTIONS  General Instructions  · Carefully follow the directions your health care provider gave you.  · Only take over-the-counter or prescription medicines for pain, discomfort, or fever as directed by your health care provider.  · Take only those medicines that your health care provider has approved.  · Do not drink alcohol until your health care provider says you are well enough to do so. Alcohol and certain other drugs may slow your recovery and can put you at risk of further injury.  · If it is harder than usual to remember things, write them down.  · If you are easily distracted, try to do one thing at a time. For example, do not try to watch TV while fixing dinner.  · Talk with family members or close friends when making important decisions.  · Keep all follow-up appointments. Repeated evaluation of your symptoms is recommended for your recovery.  · Watch your symptoms and tell others to do the same. Complications sometimes occur after a concussion. Older adults with a brain injury may have a higher risk of serious complications, such as a blood clot on the brain.  · Tell your teachers, school nurse, school counselor, coach, athletic trainer, or work manager about your injury, symptoms, and restrictions. Tell them about what you can or cannot do. They should watch for:  ¨ Increased problems with attention or concentration.  ¨ Increased difficulty remembering or learning new information.  ¨ Increased time needed to complete tasks or assignments.  ¨ Increased irritability or decreased ability to cope with stress.  ¨ Increased symptoms.  · Rest. Rest helps the brain to heal. Make sure you:  ¨ Get plenty of sleep at night. Avoid staying up late at night.  ¨ Keep the same bedtime hours on weekends and weekdays.  ¨ Rest during the day. Take daytime naps or rest breaks when you feel tired.  · Limit activities that require a lot of thought or concentration. These include:  ¨ Doing homework or job-related  work.  ¨ Watching TV.  ¨ Working on the computer.  · Avoid any situation where there is potential for another head injury (football, hockey, soccer, basketball, martial arts, downhill snow sports and horseback riding). Your condition will get worse every time you experience a concussion. You should avoid these activities until you are evaluated by the appropriate follow-up health care providers.  Returning To Your Regular Activities  You will need to return to your normal activities slowly, not all at once. You must give your body and brain enough time for recovery.  · Do not return to sports or other athletic activities until your health care provider tells you it is safe to do so.  · Ask your health care provider when you can drive, ride a bicycle, or operate heavy machinery. Your ability to react may be slower after a   brain injury. Never do these activities if you are dizzy.  · Ask your health care provider about when you can return to work or school.  Preventing Another Concussion  It is very important to avoid another brain injury, especially before you have recovered. In rare cases, another injury can lead to permanent brain damage, brain swelling, or death. The risk of this is greatest during the first 7-10 days after a head injury. Avoid injuries by:  · Wearing a seat belt when riding in a car.  · Drinking alcohol only in moderation.  · Wearing a helmet when biking, skiing, skateboarding, skating, or doing similar activities.  · Avoiding activities that could lead to a second concussion, such as contact or recreational sports, until your health care provider says it is okay.  · Taking safety measures in your home.  ¨ Remove clutter and tripping hazards from floors and stairways.  ¨ Use grab bars in bathrooms and handrails by stairs.  ¨ Place non-slip mats on floors and in bathtubs.  ¨ Improve lighting in dim areas.  SEEK MEDICAL CARE IF:  · You have increased problems paying attention or  concentrating.  · You have increased difficulty remembering or learning new information.  · You need more time to complete tasks or assignments than before.  · You have increased irritability or decreased ability to cope with stress.  · You have more symptoms than before.  Seek medical care if you have any of the following symptoms for more than 2 weeks after your injury:  · Lasting (chronic) headaches.  · Dizziness or balance problems.  · Nausea.  · Vision problems.  · Increased sensitivity to noise or light.  · Depression or mood swings.  · Anxiety or irritability.  · Memory problems.  · Difficulty concentrating or paying attention.  · Sleep problems.  · Feeling tired all the time.  SEEK IMMEDIATE MEDICAL CARE IF:  · You have severe or worsening headaches. These may be a sign of a blood clot in the brain.  · You have weakness (even if only in one hand, leg, or part of the face).  · You have numbness.  · You have decreased coordination.  · You vomit repeatedly.  · You have increased sleepiness.  · One pupil is larger than the other.  · You have convulsions.  · You have slurred speech.  · You have increased confusion. This may be a sign of a blood clot in the brain.  · You have increased restlessness, agitation, or irritability.  · You are unable to recognize people or places.  · You have neck pain.  · It is difficult to wake you up.  · You have unusual behavior changes.  · You lose consciousness.  MAKE SURE YOU:  · Understand these instructions.  · Will watch your condition.  · Will get help right away if you are not doing well or get worse.  Document Released: 05/16/2003 Document Revised: 02/28/2013 Document Reviewed: 09/15/2012  [employer]® Patient Information ©2015 [employer]. This information is not intended to replace advice given to you by your health care provider. Make sure you discuss any questions you have with your health care provider.
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
## 2014-05-31 NOTE — Progress Notes (Signed)
Pt states she had some vaginal spotting  Earlier today but it has stopped
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
## 2014-06-05 ENCOUNTER — Ambulatory Visit (INDEPENDENT_AMBULATORY_CARE_PROVIDER_SITE_OTHER): Payer: Medicaid Other | Admitting: General Practice

## 2014-06-05 ENCOUNTER — Ambulatory Visit (HOSPITAL_COMMUNITY)
Admission: RE | Admit: 2014-06-05 | Discharge: 2014-06-05 | Disposition: A | Discharge status: Home / Self Care | Payer: Medicaid Other | Source: Ambulatory Visit | Attending: Obstetrics & Gynecology | Admitting: Obstetrics & Gynecology

## 2014-06-05 VITALS — BP 120/75 | HR 75 | Temp 98.3°F | Ht 64.0 in | Wt 160.3 lb

## 2014-06-05 DIAGNOSIS — Z3A18 18 weeks gestation of pregnancy: Secondary | ICD-10-CM | POA: Insufficient documentation

## 2014-06-05 DIAGNOSIS — O09292 Supervision of pregnancy with other poor reproductive or obstetric history, second trimester: Secondary | ICD-10-CM | POA: Diagnosis not present

## 2014-06-05 DIAGNOSIS — Z36 Encounter for antenatal screening of mother: Secondary | ICD-10-CM | POA: Diagnosis present

## 2014-06-05 DIAGNOSIS — Z3492 Encounter for supervision of normal pregnancy, unspecified, second trimester: Secondary | ICD-10-CM

## 2014-06-05 DIAGNOSIS — Z3689 Encounter for other specified antenatal screening: Secondary | ICD-10-CM | POA: Insufficient documentation

## 2014-06-05 MED ORDER — HYDROXYPROGESTERONE CAPROATE 250 MG/ML IM OIL
250.0000 mg | TOPICAL_OIL | INTRAMUSCULAR | Status: AC
  Administered 2014-06-05 – 2014-06-19 (×2): 250 mg via INTRAMUSCULAR

## 2014-06-07 ENCOUNTER — Telehealth: Payer: Self-pay | Admitting: *Deleted

## 2014-06-07 DIAGNOSIS — IMO0002 Reserved for concepts with insufficient information to code with codable children: Secondary | ICD-10-CM

## 2014-06-07 DIAGNOSIS — Z0489 Encounter for examination and observation for other specified reasons: Secondary | ICD-10-CM

## 2014-06-07 DIAGNOSIS — O26872 Cervical shortening, second trimester: Secondary | ICD-10-CM

## 2014-06-07 NOTE — Telephone Encounter (Signed)
Ultrasound Date/Time:  April 12 @ 1115                                        April 26 @ 1115                                        May 10  @ 1115.  Contacted, appointment dates/times given for follow up ultrasounds.  Pt verbalizes understanding and has no further questions.

## 2014-06-07 NOTE — Telephone Encounter (Signed)
-----   Message from Willodean Rosenthalarolyn Harraway-Smith, MD sent at 06/06/2014  4:33 PM EDT ----- Please schedule 6 week sono to f/u for anatomy AND limited f/u sono q 2 weeks until 24 weeks for cervical length.  Thx, clh-S

## 2014-06-12 ENCOUNTER — Encounter: Payer: Medicaid Other | Admitting: Obstetrics and Gynecology

## 2014-06-19 ENCOUNTER — Ambulatory Visit (HOSPITAL_COMMUNITY)
Admission: RE | Admit: 2014-06-19 | Discharge: 2014-06-19 | Disposition: A | Discharge status: Home / Self Care | Payer: Medicaid Other | Source: Ambulatory Visit | Attending: Obstetrics & Gynecology | Admitting: Obstetrics & Gynecology

## 2014-06-19 ENCOUNTER — Other Ambulatory Visit: Payer: Self-pay | Admitting: Obstetrics & Gynecology

## 2014-06-19 ENCOUNTER — Ambulatory Visit (INDEPENDENT_AMBULATORY_CARE_PROVIDER_SITE_OTHER): Payer: Medicaid Other | Admitting: Obstetrics & Gynecology

## 2014-06-19 VITALS — BP 110/53 | HR 68 | Temp 98.7°F | Wt 166.3 lb

## 2014-06-19 DIAGNOSIS — O09212 Supervision of pregnancy with history of pre-term labor, second trimester: Secondary | ICD-10-CM | POA: Diagnosis not present

## 2014-06-19 DIAGNOSIS — O09292 Supervision of pregnancy with other poor reproductive or obstetric history, second trimester: Secondary | ICD-10-CM | POA: Insufficient documentation

## 2014-06-19 DIAGNOSIS — O4692 Antepartum hemorrhage, unspecified, second trimester: Secondary | ICD-10-CM | POA: Insufficient documentation

## 2014-06-19 DIAGNOSIS — O0992 Supervision of high risk pregnancy, unspecified, second trimester: Secondary | ICD-10-CM

## 2014-06-19 DIAGNOSIS — Z36 Encounter for antenatal screening of mother: Secondary | ICD-10-CM | POA: Diagnosis not present

## 2014-06-19 DIAGNOSIS — N939 Abnormal uterine and vaginal bleeding, unspecified: Secondary | ICD-10-CM | POA: Insufficient documentation

## 2014-06-19 DIAGNOSIS — O26872 Cervical shortening, second trimester: Secondary | ICD-10-CM

## 2014-06-19 DIAGNOSIS — O26879 Cervical shortening, unspecified trimester: Secondary | ICD-10-CM | POA: Insufficient documentation

## 2014-06-19 DIAGNOSIS — Z3A20 20 weeks gestation of pregnancy: Secondary | ICD-10-CM | POA: Diagnosis not present

## 2014-06-19 LAB — POCT URINALYSIS DIP (DEVICE)
Bilirubin Urine: NEGATIVE
Glucose, UA: NEGATIVE mg/dL
Hgb urine dipstick: NEGATIVE
KETONES UR: NEGATIVE mg/dL
NITRITE: NEGATIVE
PH: 6.5 (ref 5.0–8.0)
PROTEIN: NEGATIVE mg/dL
SPECIFIC GRAVITY, URINE: 1.015 (ref 1.005–1.030)
Urobilinogen, UA: 0.2 mg/dL (ref 0.0–1.0)

## 2014-06-19 NOTE — Progress Notes (Addendum)
Patient reports occasional groin pain  

## 2014-06-19 NOTE — Patient Instructions (Signed)
Return to clinic for any obstetric concerns or go to MAU for evaluation  

## 2014-06-22 LAB — AFP, QUAD SCREEN
AFP: 59.9 ng/mL
Age Alone: 1:1070 {titer}
Curr Gest Age: 20.5 wks.days
Down Syndrome Scr Risk Est: 1:90 {titer}
HCG TOTAL: 29.65 [IU]/mL
INH: 410.5 pg/mL
INTERPRETATION-AFP: POSITIVE — AB
MOM FOR AFP: 0.9
MOM FOR HCG: 1.54
MoM for INH: 2.3
OPEN SPINA BIFIDA: NEGATIVE
Osb Risk: 1:37500 {titer}
TRI 18 SCR RISK EST: NEGATIVE
Trisomy 18 (Edward) Syndrome Interp.: 1:13800 {titer}
UE3 VALUE: 1.11 ng/mL
uE3 Mom: 0.55

## 2014-06-25 ENCOUNTER — Encounter: Payer: Self-pay | Admitting: Obstetrics & Gynecology

## 2014-06-25 ENCOUNTER — Telehealth: Payer: Self-pay | Admitting: *Deleted

## 2014-06-25 DIAGNOSIS — O280 Abnormal hematological finding on antenatal screening of mother: Secondary | ICD-10-CM | POA: Insufficient documentation

## 2014-06-25 NOTE — Progress Notes (Signed)
Patient reports occasional groin pain, reassured that this was normal Normal anatomy scan, limited views of heart at 18 weeks, will rescan in 4-6 weeks Cervical length scan today showed 4 cm cervix length.  Continue cervical scans q 2 weeks, weekly 17P.   Quad screen today. No other complaints or concerns.  Routine obstetric precautions reviewed.

## 2014-06-25 NOTE — Telephone Encounter (Signed)
Genetic counseling scheduled for Wed. April 20 @ 1300.  Patient has both cervical length/follow up anatomy scheduled/.  Contacted patient, results for elevated AFP given with genetic counseling appointment.  Pt verbalizes understanding and has no further questions.

## 2014-06-25 NOTE — Telephone Encounter (Signed)
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  Has follow up scan on 07/05/14 at [facility]; maybe can add on genetic counseling to this visit?  Also make sure the scheduled ultrasound is a follow up of previous limited anatomy scan not just cervical length. Thank you!

## 2014-06-27 ENCOUNTER — Encounter (HOSPITAL_COMMUNITY): Payer: Medicaid Other

## 2014-06-28 ENCOUNTER — Ambulatory Visit: Payer: Medicaid Other

## 2014-07-03 ENCOUNTER — Ambulatory Visit (HOSPITAL_COMMUNITY): Admission: RE | Admit: 2014-07-03 | Payer: Medicaid Other | Source: Ambulatory Visit

## 2014-07-03 ENCOUNTER — Telehealth: Payer: Self-pay | Admitting: Obstetrics and Gynecology

## 2014-07-03 ENCOUNTER — Ambulatory Visit: Payer: Medicaid Other

## 2014-07-03 NOTE — Telephone Encounter (Signed)
Patient called me this morning, and she sounded upset. She stated she would not be able to come to any of her appointments due to not having transportation. She requested all her appointments be canceled. I asked her if [place] bus [place], but then she said she don't have the money it takes to come to her appointments. I told her I would. Then I checked to see if she had medicaid. So because she does, I called her back and left a message for her to call her case worker, and ask about Medicaid Transportation.

## 2014-07-11 ENCOUNTER — Encounter (HOSPITAL_COMMUNITY): Payer: Self-pay | Admitting: *Deleted

## 2014-07-11 ENCOUNTER — Inpatient Hospital Stay (HOSPITAL_COMMUNITY)
Admission: AD | Admit: 2014-07-11 | Discharge: 2014-07-11 | Disposition: A | Discharge status: Home / Self Care | Payer: Medicaid Other | Source: Ambulatory Visit | Attending: Obstetrics and Gynecology | Admitting: Obstetrics and Gynecology

## 2014-07-11 DIAGNOSIS — Z87891 Personal history of nicotine dependence: Secondary | ICD-10-CM | POA: Insufficient documentation

## 2014-07-11 DIAGNOSIS — O9989 Other specified diseases and conditions complicating pregnancy, childbirth and the puerperium: Secondary | ICD-10-CM | POA: Diagnosis not present

## 2014-07-11 DIAGNOSIS — K598 Other specified functional intestinal disorders: Secondary | ICD-10-CM | POA: Diagnosis not present

## 2014-07-11 DIAGNOSIS — R1032 Left lower quadrant pain: Secondary | ICD-10-CM | POA: Diagnosis not present

## 2014-07-11 DIAGNOSIS — K5900 Constipation, unspecified: Secondary | ICD-10-CM | POA: Diagnosis not present

## 2014-07-11 DIAGNOSIS — K639 Disease of intestine, unspecified: Secondary | ICD-10-CM

## 2014-07-11 DIAGNOSIS — K5909 Other constipation: Secondary | ICD-10-CM

## 2014-07-11 DIAGNOSIS — Z3A23 23 weeks gestation of pregnancy: Secondary | ICD-10-CM | POA: Diagnosis not present

## 2014-07-11 DIAGNOSIS — M549 Dorsalgia, unspecified: Secondary | ICD-10-CM | POA: Diagnosis present

## 2014-07-11 DIAGNOSIS — R1012 Left upper quadrant pain: Secondary | ICD-10-CM | POA: Diagnosis not present

## 2014-07-11 HISTORY — DX: Renal tubulo-interstitial disease, unspecified: N15.9

## 2014-07-11 LAB — URINALYSIS, ROUTINE W REFLEX MICROSCOPIC
Bilirubin Urine: NEGATIVE
GLUCOSE, UA: NEGATIVE mg/dL
Hgb urine dipstick: NEGATIVE
Ketones, ur: NEGATIVE mg/dL
LEUKOCYTES UA: NEGATIVE
NITRITE: NEGATIVE
Protein, ur: NEGATIVE mg/dL
Specific Gravity, Urine: 1.02 (ref 1.005–1.030)
Urobilinogen, UA: 0.2 mg/dL (ref 0.0–1.0)
pH: 6.5 (ref 5.0–8.0)

## 2014-07-11 NOTE — Discharge Instructions (Signed)

## 2014-07-11 NOTE — MAU Provider Note (Signed)
History     CSN: [id number]  Arrival date and time: 07/11/14 1231   None     Chief Complaint  Patient presents with  . Abdominal Pain   HPI  Patient is 24 y.o. [id number] here with complaints of back pain. - 7/10, also left lower quadrant cramping, reports bowel movement 2-3x/day, denies constipation - has nausea, no vomiting, no fevers, no dysuria, +frequency, no hematuria.  +FM, denies LOF, VB, contractions, vaginal discharge.   Past Medical History  Diagnosis Date  . Chlamydia Jan 2014  . Abnormal Pap smear   . Asthma   . Kidney infection     Past Surgical History  Procedure Laterality Date  . Wisdom tooth extraction      Family History  Problem Relation Age of Onset  . Hypertension Mother   . Hypertension Maternal Grandmother     History  Substance Use Topics  . Smoking status: Former Smoker    Quit date: 04/25/2014  . Smokeless tobacco: Never Used  . Alcohol Use: No     Comment: occasional/not since pregnancy    Allergies: No Known Allergies  Facility-administered medications prior to admission  Medication Dose Route Frequency Provider Last Rate Last Dose  . hydroxyprogesterone caproate (DELALUTIN) 250 mg/mL injection 250 mg  250 mg Intramuscular Weekly Rhona RaiderJacob J Stinson, DO   250 mg at 06/19/14 1327   Prescriptions prior to admission  Medication Sig Dispense Refill Last Dose  . albuterol (PROVENTIL HFA;VENTOLIN HFA) 108 (90 BASE) MCG/ACT inhaler Inhale 2 puffs into the lungs every 4 (four) hours as needed for wheezing. 1 Inhaler 0 07/10/2014 at Unknown time  . calcium carbonate (TUMS - DOSED IN MG ELEMENTAL CALCIUM) 500 MG chewable tablet Chew 1 tablet by mouth 2 (two) times daily as needed for indigestion or heartburn.   Past Week at Unknown time  . Prenatal Vit-Fe Fumarate-FA (PRENATAL VITAMINS) 28-0.8 MG TABS Take 1 tablet by mouth daily. 90 tablet 1 07/10/2014 at Unknown time    Review of Systems  Constitutional: Negative for fever and chills.   HENT: Negative for congestion.   Respiratory: Negative for cough and shortness of breath.   Cardiovascular: Negative for chest pain and leg swelling.  Gastrointestinal: Positive for heartburn, nausea and abdominal pain. Negative for vomiting and diarrhea.  Genitourinary: Negative for dysuria, urgency, frequency and hematuria.  Skin: Negative for itching and rash.  Neurological: Negative for dizziness, loss of consciousness and headaches.   Physical Exam   Blood pressure 119/57, pulse 75, temperature 98.3 F (36.8 C), resp. rate 16, height 5\' 4"  (1.626 m), weight 169 lb 3.2 oz (76.749 kg), last menstrual period 01/25/2014.  Physical Exam  Constitutional: She is oriented to person, place, and time. She appears well-developed and well-nourished.  HENT:  Head: Normocephalic and atraumatic.  Eyes: Conjunctivae and EOM are normal.  Neck: Normal range of motion.  Cardiovascular: Normal rate.   Respiratory: Effort normal. No respiratory distress.  GI: Soft. Bowel sounds are normal. She exhibits no distension and no mass. There is no tenderness. There is no rebound and no guarding.  Musculoskeletal: Normal range of motion. She exhibits no edema.  Neurological: She is alert and oriented to person, place, and time.  Skin: Skin is warm and dry. No erythema.  FT/thick/high, firm, does not feel labored  MAU Course  Procedures  MDM NST reactive  Assessment and Plan  Patient is 24 y.o. [id number] reporting left lower quadrant cramping and left upper quadrant pain likely secondary to  constipation, splenic flexure syndrome - fetal kick counts reinforced - preterm labor precautions - cervical exam appropriate and reassuring, no contractions while in MAU.  Patient has not followed up regularly in clinic for 17-P shots and for follow up, encouraged to do so given hx of PPROM   Ermalinda Joubert ROCIO 07/11/2014, 2:05 PM

## 2014-07-11 NOTE — MAU Note (Signed)
States that "this pain feels like when I had a kidney infection"; hx of kidney infection about 4 yrs ago; goes to hospital clinic for prenatal care and get 17P  shot every week; states that she has missed some appointments due to transportation problems; pain is in upper L flank and feels better if pt holds pressure against it;

## 2014-07-11 NOTE — MAU Note (Signed)
Left side abdominal pain upper near rib, making her back hurt, has history of UTI, denies vaginal bleeding, no vaginal discharge, no pain with urination.

## 2014-07-12 ENCOUNTER — Ambulatory Visit: Payer: Medicaid Other

## 2014-07-12 ENCOUNTER — Telehealth: Payer: Self-pay | Admitting: Obstetrics & Gynecology

## 2014-07-12 NOTE — Telephone Encounter (Signed)
Called patient about missed appointment. She stated she was at work, but would be here in the office by 3:00 pm. Patient stated she works Monday through Friday, and gets off at 2:30. Asked her if she could come 05/06. She was going to come in today.

## 2014-07-17 ENCOUNTER — Ambulatory Visit (HOSPITAL_COMMUNITY)
Admission: RE | Admit: 2014-07-17 | Discharge: 2014-07-17 | Disposition: A | Discharge status: Home / Self Care | Payer: Medicaid Other | Source: Ambulatory Visit | Attending: Obstetrics & Gynecology | Admitting: Obstetrics & Gynecology

## 2014-07-17 DIAGNOSIS — O0990 Supervision of high risk pregnancy, unspecified, unspecified trimester: Secondary | ICD-10-CM

## 2014-07-17 DIAGNOSIS — O09292 Supervision of pregnancy with other poor reproductive or obstetric history, second trimester: Secondary | ICD-10-CM | POA: Diagnosis present

## 2014-07-17 DIAGNOSIS — IMO0002 Reserved for concepts with insufficient information to code with codable children: Secondary | ICD-10-CM

## 2014-07-17 DIAGNOSIS — Z0489 Encounter for examination and observation for other specified reasons: Secondary | ICD-10-CM

## 2014-07-17 DIAGNOSIS — Z3A24 24 weeks gestation of pregnancy: Secondary | ICD-10-CM | POA: Insufficient documentation

## 2014-07-18 ENCOUNTER — Telehealth: Payer: Self-pay | Admitting: General Practice

## 2014-07-18 NOTE — Telephone Encounter (Signed)
Called patient in regards to missed genetic counseling appts. Patient states she does not want to go and thinks it is a waste of her and is just going to stress her out. Patient states she has had 4 other children and they are all fine and this one is going to be fine. Patient states 90% of those tests come back positive anyway and the baby is fine and we just stress women out for nothing. Tried to explain purpose of quad/rationale but patient interrupted stating she isn't going so don't waste your time telling me. Patient had no other questions and hung up the phone.

## 2014-07-18 NOTE — Telephone Encounter (Signed)
-----   Message from Willodean Rosenthalarolyn Harraway-Smith, MD sent at 07/18/2014  9:43 AM EDT ----- Please reschedule pt for genetic counseling?  It looks like she has cancelled 2 appts. She has an abnormal quad screen and abnormal findings on her sono.  Thx, clh-S

## 2014-07-19 ENCOUNTER — Encounter: Payer: Self-pay | Admitting: Obstetrics & Gynecology

## 2014-07-19 ENCOUNTER — Encounter: Payer: Medicaid Other | Admitting: Obstetrics & Gynecology

## 2014-07-25 ENCOUNTER — Encounter: Payer: Medicaid Other | Admitting: Obstetrics & Gynecology

## 2014-07-25 ENCOUNTER — Telehealth: Payer: Self-pay | Admitting: Obstetrics & Gynecology

## 2014-07-25 NOTE — Telephone Encounter (Signed)
Patient called me today and stated she was going to be off work, and had a ride to be seen since she has not been seen in awhile. I worked her into the schedule for today. However, [facility], and I called and left a message for her to call us back and reschedule.

## 2014-07-27 ENCOUNTER — Encounter: Payer: Self-pay | Admitting: General Practice

## 2014-08-12 ENCOUNTER — Encounter (HOSPITAL_COMMUNITY): Payer: Self-pay | Admitting: *Deleted

## 2014-08-12 ENCOUNTER — Inpatient Hospital Stay (HOSPITAL_COMMUNITY)
Admission: AD | Admit: 2014-08-12 | Discharge: 2014-08-12 | Disposition: A | Discharge status: Home / Self Care | Payer: Medicaid Other | Source: Ambulatory Visit | Attending: Obstetrics and Gynecology | Admitting: Obstetrics and Gynecology

## 2014-08-12 DIAGNOSIS — Z87891 Personal history of nicotine dependence: Secondary | ICD-10-CM | POA: Insufficient documentation

## 2014-08-12 DIAGNOSIS — Z3A28 28 weeks gestation of pregnancy: Secondary | ICD-10-CM | POA: Insufficient documentation

## 2014-08-12 DIAGNOSIS — O4703 False labor before 37 completed weeks of gestation, third trimester: Secondary | ICD-10-CM | POA: Diagnosis not present

## 2014-08-12 DIAGNOSIS — O2343 Unspecified infection of urinary tract in pregnancy, third trimester: Secondary | ICD-10-CM | POA: Insufficient documentation

## 2014-08-12 DIAGNOSIS — R109 Unspecified abdominal pain: Secondary | ICD-10-CM | POA: Diagnosis present

## 2014-08-12 LAB — WET PREP, GENITAL
TRICH WET PREP: NONE SEEN
Yeast Wet Prep HPF POC: NONE SEEN

## 2014-08-12 LAB — URINALYSIS, ROUTINE W REFLEX MICROSCOPIC
Bilirubin Urine: NEGATIVE
GLUCOSE, UA: NEGATIVE mg/dL
HGB URINE DIPSTICK: NEGATIVE
Ketones, ur: 15 mg/dL — AB
Nitrite: NEGATIVE
PH: 7 (ref 5.0–8.0)
Protein, ur: NEGATIVE mg/dL
SPECIFIC GRAVITY, URINE: 1.025 (ref 1.005–1.030)
UROBILINOGEN UA: 0.2 mg/dL (ref 0.0–1.0)

## 2014-08-12 LAB — URINE MICROSCOPIC-ADD ON

## 2014-08-12 MED ORDER — NIFEDIPINE 10 MG PO CAPS: 10.0000 mg | ORAL_CAPSULE | ORAL | Status: DC | PRN

## 2014-08-12 MED ORDER — NITROFURANTOIN MONOHYD MACRO 100 MG PO CAPS: 100.0000 mg | ORAL_CAPSULE | Freq: Two times a day (BID) | ORAL | Status: DC

## 2014-08-12 MED ORDER — LACTATED RINGERS IV BOLUS (SEPSIS)
1000.0000 mL | Freq: Once | INTRAVENOUS | Status: AC
  Administered 2014-08-12: 1000 mL via INTRAVENOUS

## 2014-08-12 MED ORDER — NIFEDIPINE 10 MG PO CAPS
10.0000 mg | ORAL_CAPSULE | ORAL | Status: DC | PRN
  Administered 2014-08-12: 10 mg via ORAL
  Filled 2014-08-12 (×2): qty 1

## 2014-08-12 MED ORDER — PROMETHAZINE HCL 12.5 MG PO TABS: 12.5000 mg | ORAL_TABLET | Freq: Four times a day (QID) | ORAL | Status: DC | PRN

## 2014-08-12 NOTE — Discharge Instructions (Signed)
Call the clinic ([phone])or go to [facility] if:  You begin to have strong, frequent contractions  Your water breaks.  Sometimes it is a big gush of fluid, sometimes it is just a trickle that keeps getting your panties wet or running down your legs  You have vaginal bleeding.  It is normal to have a small amount of spotting if your cervix was checked.   You don't feel your baby moving like normal.  If you don't, get you something to eat and drink and lay down and focus on feeling your baby move.  You should feel at least 10 movements in 2 hours.  If you don't, you should call the office or go to [facility].    Preterm Labor Information Preterm labor is when labor starts at less than 37 weeks of pregnancy. The normal length of a pregnancy is 39 to 41 weeks. CAUSES Often, there is no identifiable underlying cause as to why a woman goes into preterm labor. One of the most common known causes of preterm labor is infection. Infections of the uterus, cervix, vagina, amniotic sac, bladder, kidney, or even the lungs (pneumonia) can cause labor to start. Other suspected causes of preterm labor include:   Urogenital infections, such as yeast infections and bacterial vaginosis.   Uterine abnormalities (uterine shape, uterine septum, fibroids, or bleeding from the placenta).   A cervix that has been operated on (it may fail to stay closed).   Malformations in the fetus.   Multiple gestations (twins, triplets, and so on).   Breakage of the amniotic sac.  RISK FACTORS  Having a previous history of preterm labor.   Having premature rupture of membranes (PROM).   Having a placenta that covers the opening of the cervix (placenta previa).   Having a placenta that separates from the uterus (placental abruption).   Having a cervix that is too weak to hold the fetus in the uterus (incompetent cervix).   Having too much fluid in the amniotic sac (polyhydramnios).   Taking  illegal drugs or smoking while pregnant.   Not gaining enough weight while pregnant.   Being younger than 1118 and older than 25 years old.   Having a low socioeconomic status.   Being African American. SYMPTOMS Signs and symptoms of preterm labor include:   Menstrual-like cramps, abdominal pain, or back pain.  Uterine contractions that are regular, as frequent as six in an hour, regardless of their intensity (may be mild or painful).  Contractions that start on the top of the uterus and spread down to the lower abdomen and back.   A sense of increased pelvic pressure.   A watery or bloody mucus discharge that comes from the vagina.  TREATMENT Depending on the length of the pregnancy and other circumstances, your health care provider may suggest bed rest. If necessary, there are medicines that can be given to stop contractions and to mature the fetal lungs. If labor happens before 34 weeks of pregnancy, a prolonged hospital stay may be recommended. Treatment depends on the condition of both you and the fetus.  WHAT SHOULD YOU DO IF YOU THINK YOU ARE IN PRETERM LABOR? Call your health care provider right away. You will need to go to the hospital to get checked immediately. HOW CAN YOU PREVENT PRETERM LABOR IN FUTURE PREGNANCIES? You should:   Stop smoking if you smoke.  Maintain healthy weight gain and avoid chemicals and drugs that are not necessary.  Be watchful for any  type of infection.  Inform your health care provider if you have a known history of preterm labor. Document Released: 05/16/2003 Document Revised: 10/26/2012 Document Reviewed: 03/28/2012 [facility] Patient Information 2015 [place], [place]. This information is not intended to replace advice given to you by your health care provider. Make sure you discuss any questions you have with your health care provider.

## 2014-08-12 NOTE — MAU Provider Note (Signed)
History  Chief Complaint:  Abdominal Pain  Ana [name] is a 24 y.o. [id number] female at 1763w3d presenting w/ report of cramping x 2hours, in lower abd/back. Also w/ urinary frequency, urgency today.    Reports active fetal movement, contractions: regular, vaginal bleeding: none, membranes: intact. Denies abnormal/malodorous vag d/c or vulvovaginal itching/irritation.   [facility]  Last visit 5/4 @ ~23wks. Next visit none scheduled- states she has missed some appts. Has received 2 17-OHP injections for h/o 19wk delivery d/t PROM.  Pregnancy complicated by limited pnc, h/o 19wk loss d/t PROM.   Obstetrical History: OB History    Gravida Para Term Preterm AB TAB SAB Ectopic Multiple Living   6 4 4  0 1 0 0 0 0 4      Past Medical History: Past Medical History  Diagnosis Date  . Chlamydia Jan 2014  . Abnormal Pap smear   . Asthma   . Kidney infection     Past Surgical History: Past Surgical History  Procedure Laterality Date  . Wisdom tooth extraction      Social History: History   Social History  . Marital Status: Single    Spouse Name: N/A  . Number of Children: N/A  . Years of Education: N/A   Social History Main Topics  . Smoking status: Former Smoker    Quit date: 04/25/2014  . Smokeless tobacco: Never Used  . Alcohol Use: No     Comment: occasional/not since pregnancy  . Drug Use: No     Comment: stopped 04/09/12 after she found out she was pregnant  . Sexual Activity: Yes    Birth Control/ Protection: None   Other Topics Concern  . None   Social History Narrative    Allergies: No Known Allergies  Facility-administered medications prior to admission  Medication Dose Route Frequency Provider Last Rate Last Dose  . hydroxyprogesterone caproate (DELALUTIN) 250 mg/mL injection 250 mg  250 mg Intramuscular Weekly Rhona RaiderJacob J Stinson, DO   250 mg at 06/19/14 1327   Prescriptions prior to admission  Medication Sig Dispense Refill Last Dose  . calcium  carbonate (TUMS - DOSED IN MG ELEMENTAL CALCIUM) 500 MG chewable tablet Chew 1 tablet by mouth 2 (two) times daily as needed for indigestion or heartburn.   08/12/2014 at Unknown time  . Prenatal Vit-Fe Fumarate-FA (PRENATAL VITAMINS) 28-0.8 MG TABS Take 1 tablet by mouth daily. 90 tablet 1 08/11/2014 at Unknown time  . albuterol (PROVENTIL HFA;VENTOLIN HFA) 108 (90 BASE) MCG/ACT inhaler Inhale 2 puffs into the lungs every 4 (four) hours as needed for wheezing. 1 Inhaler 0 More than a month at Unknown time    Review of Systems  Pertinent pos/neg as indicated in HPI  Physical Exam  Blood pressure 109/45, pulse 92, temperature 98 F (36.7 C), resp. rate 18, height 5\' 4"  (1.626 m), weight 76.839 kg (169 lb 6.4 oz), last menstrual period 01/25/2014, SpO2 98 %. General appearance: alert, cooperative and no distress Lungs: clear to auscultation bilaterally, normal effort Heart: regular rate and rhythm Abdomen: gravid, soft, non-tender  Spec exam: cx visually closed, small amount creamy white nonodorous d/c Cultures/Specimens: wet prep, gc/ct, unable to collect fFN as she had sex on 6/4 @ 0800    SVE: inner os ft/thick/posterior/firm Presentation: unsure  Fetal monitoring: FHR: 145 bpm, variability: moderate,  Accelerations: Present,  decelerations:  Absent Uterine activity: irregular  MAU Course  UA Spec exam w/ wet prep, gc/ct SVE 1L LR bolus Procardia  Labs:  Results for orders placed or performed during the hospital encounter of 08/12/14 (from the past 24 hour(s))  Urinalysis, Routine w reflex microscopic (not at [facility])     Status: Abnormal   Collection Time: 08/12/14  4:05 AM  Result Value Ref Range   Color, Urine YELLOW YELLOW   APPearance CLEAR CLEAR   Specific Gravity, Urine 1.025 1.005 - 1.030   pH 7.0 5.0 - 8.0   Glucose, UA NEGATIVE NEGATIVE mg/dL   Hgb urine dipstick NEGATIVE NEGATIVE   Bilirubin Urine NEGATIVE NEGATIVE   Ketones, ur 15 (A) NEGATIVE mg/dL   Protein, ur  NEGATIVE NEGATIVE mg/dL   Urobilinogen, UA 0.2 0.0 - 1.0 mg/dL   Nitrite NEGATIVE NEGATIVE   Leukocytes, UA TRACE (A) NEGATIVE  Urine microscopic-add on     Status: None   Collection Time: 08/12/14  4:05 AM  Result Value Ref Range   Squamous Epithelial / LPF RARE RARE   WBC, UA 3-6 <3 WBC/hpf   Bacteria, UA RARE RARE   Urine-Other MUCOUS PRESENT   Wet prep, genital     Status: Abnormal   Collection Time: 08/12/14  5:05 AM  Result Value Ref Range   Yeast Wet Prep HPF POC NONE SEEN NONE SEEN   Trich, Wet Prep NONE SEEN NONE SEEN   Clue Cells Wet Prep HPF POC RARE (A) NONE SEEN   WBC, Wet Prep HPF POC MODERATE (A) NONE SEEN    Imaging:  n/a   0454: contractions have stopped, just having some LLQ intermittent pain- sounds like RLP Assessment and Plan  A:  [redacted]w[redacted]d SIUP  [id number]  Preterm uc's w/o cervical change  Presumed UTI based on sx  Cat 1 FHR P:  D/C home  Reviewed ptl s/s, fkc, reasons to return  Encouraged to increase po fluids til urine clear at home- states she is usually so nauseated she can't  Rx phenergan 12.5mg  q 6hr prn n/v  Rx macrobid for presumed UTI, will send urine cx  Rx procardia  q 4hr prn uc's   Call clinic Monday am to schedule next appt, also send clinic message   Ana [name] CNM,WHNP-BC 6/5/20166:42 AM

## 2014-08-12 NOTE — MAU Note (Signed)
Pain woke me up at 0230. Pain lower abd and lower back. Denies LOF or bleeding. Good FM. Fell yesterday about 1500. Fell back on my butt.

## 2014-08-13 LAB — CULTURE, OB URINE
Colony Count: 40000
Special Requests: NORMAL

## 2014-08-13 LAB — HIV ANTIBODY (ROUTINE TESTING W REFLEX): HIV Screen 4th Generation wRfx: NONREACTIVE

## 2014-08-13 LAB — GC/CHLAMYDIA PROBE AMP (~~LOC~~) NOT AT ARMC
Chlamydia: NEGATIVE
Neisseria Gonorrhea: NEGATIVE

## 2014-08-16 ENCOUNTER — Encounter: Payer: Self-pay | Admitting: Obstetrics & Gynecology

## 2014-08-16 ENCOUNTER — Ambulatory Visit (INDEPENDENT_AMBULATORY_CARE_PROVIDER_SITE_OTHER): Payer: Medicaid Other | Admitting: Obstetrics & Gynecology

## 2014-08-16 VITALS — BP 127/61 | HR 75 | Temp 98.6°F | Wt 167.2 lb

## 2014-08-16 DIAGNOSIS — O0993 Supervision of high risk pregnancy, unspecified, third trimester: Secondary | ICD-10-CM

## 2014-08-16 DIAGNOSIS — Z23 Encounter for immunization: Secondary | ICD-10-CM

## 2014-08-16 MED ORDER — TETANUS-DIPHTH-ACELL PERTUSSIS 5-2.5-18.5 LF-MCG/0.5 IM SUSP
0.5000 mL | Freq: Once | INTRAMUSCULAR | Status: AC
  Administered 2014-08-16: 0.5 mL via INTRAMUSCULAR

## 2014-08-16 NOTE — Progress Notes (Signed)
Subjective:cc: had [facility] seen in MAU 6/5   Ana [name] is a 24 y.o. [id number] at [redacted]w[redacted]d being seen today for ongoing prenatal care.  Patient reports no complaints.  Contractions: Irregular.  Vag. Bleeding: None.  Fetal Movement: Present. Denies leaking of fluid.   The following portions of the patient's history were reviewed and updated as appropriate: allergies, current medications, past family history, past medical history, past social history, past surgical history and problem list.   Objective:   Filed Vitals:   08/16/14 0850  BP: 127/61  Pulse: 75  Temp: 98.6 F (37 C)  Weight: 167 lb 3.2 oz (75.841 kg)    Fetal Status: Fetal Heart Rate (bpm): 152 Fundal Height: 29 cm Movement: Present     General:  Alert, oriented and cooperative. Patient is in no acute distress.  Skin: Skin is warm and dry. No rash noted.   Cardiovascular: Normal heart rate noted  Respiratory: Effort and breath sounds normal, no problems with respiration noted  Abdomen: Soft, gravid, appropriate for gestational age. Pain/Pressure: Present     Vaginal: Vag. Bleeding: None.    Vag D/C Character: Other (Comment)  Cervix: Deferred  Extremities: Normal range of motion.  Edema: None  Mental Status: Normal mood and affect. Normal behavior. Normal judgment and thought content.   Urinalysis:     Results for Ana [name] (MRN [id number]) as of 08/16/2014 09:10  Ref. Range 08/12/2014 04:05  Appearance Latest Ref Range: CLEAR  CLEAR  Bacteria, UA Latest Ref Range: RARE  RARE  Bilirubin Urine Latest Ref Range: NEGATIVE  NEGATIVE  Color, Urine Latest Ref Range: YELLOW  YELLOW  Glucose Latest Ref Range: NEGATIVE mg/dL NEGATIVE  Hgb urine dipstick Latest Ref Range: NEGATIVE  NEGATIVE  Ketones, ur Latest Ref Range: NEGATIVE mg/dL 15 (A)  Leukocytes, UA Latest Ref Range: NEGATIVE  TRACE (A)  Nitrite Latest Ref Range: NEGATIVE  NEGATIVE  pH Latest Ref Range: 5.0-8.0  7.0  Protein Latest Ref Range: NEGATIVE mg/dL NEGATIVE   Specific Gravity, Urine Latest Ref Range: 1.005-1.030  1.025  Squamous Epithelial / LPF Latest Ref Range: RARE  RARE  Urine-Other Unknown MUCOUS PRESENT  Urobilinogen, UA Latest Ref Range: 0.0-1.0 mg/dL 0.2  WBC, UA Latest Ref Range: <3 WBC/hpf 3-6  Urine cult mult morphotype neg GBS  Assessment and Plan:   Pregnancy: [id number] at [redacted]w[redacted]d  1. Supervision of high risk pregnancy, antepartum, third trimester H/o 19 week SAB, 4 full term preg   Preterm labor symptoms and general obstetric precautions including but not limited to vaginal bleeding, contractions, leaking of fluid and fetal movement were reviewed in detail with the patient.  Please refer to After Visit Summary for other counseling recommendations.   Return in about 2 weeks (around 08/30/2014).   Adam Phenix, MD 08/16/2014

## 2014-08-16 NOTE — Progress Notes (Signed)
C/o went to emergency room recently and prescribed 3 meds, wanted to make sure is ok to take them. C/o clear vaginal drainage for a week, states has to change underwear  several times a day because they are wet.

## 2014-08-17 LAB — POCT URINALYSIS DIP (DEVICE)
BILIRUBIN URINE: NEGATIVE
Bilirubin Urine: NEGATIVE
GLUCOSE, UA: NEGATIVE mg/dL
Glucose, UA: NEGATIVE mg/dL
HGB URINE DIPSTICK: NEGATIVE
Hgb urine dipstick: NEGATIVE
KETONES UR: NEGATIVE mg/dL
Ketones, ur: NEGATIVE mg/dL
NITRITE: NEGATIVE
Nitrite: NEGATIVE
PH: 7.5 (ref 5.0–8.0)
PROTEIN: NEGATIVE mg/dL
Protein, ur: NEGATIVE mg/dL
Specific Gravity, Urine: 1.015 (ref 1.005–1.030)
Specific Gravity, Urine: 1.015 (ref 1.005–1.030)
Urobilinogen, UA: 0.2 mg/dL (ref 0.0–1.0)
Urobilinogen, UA: 0.2 mg/dL (ref 0.0–1.0)
pH: 7.5 (ref 5.0–8.0)

## 2014-08-17 LAB — CBC
HCT: 32.9 % — ABNORMAL LOW (ref 36.0–46.0)
Hemoglobin: 10.7 g/dL — ABNORMAL LOW (ref 12.0–15.0)
MCH: 28.8 pg (ref 26.0–34.0)
MCHC: 32.5 g/dL (ref 30.0–36.0)
MCV: 88.7 fL (ref 78.0–100.0)
MPV: 10.6 fL (ref 8.6–12.4)
Platelets: 228 10*3/uL (ref 150–400)
RBC: 3.71 MIL/uL — ABNORMAL LOW (ref 3.87–5.11)
RDW: 13.4 % (ref 11.5–15.5)
WBC: 10.7 10*3/uL — AB (ref 4.0–10.5)

## 2014-08-17 LAB — RPR

## 2014-08-17 LAB — GLUCOSE TOLERANCE, 1 HOUR (50G) W/O FASTING: Glucose, 1 Hour GTT: 71 mg/dL (ref 70–140)

## 2014-09-04 ENCOUNTER — Encounter: Payer: Self-pay | Admitting: Obstetrics & Gynecology

## 2014-09-04 ENCOUNTER — Ambulatory Visit (INDEPENDENT_AMBULATORY_CARE_PROVIDER_SITE_OTHER): Payer: Medicaid Other | Admitting: Obstetrics & Gynecology

## 2014-09-04 VITALS — BP 124/63 | HR 75 | Temp 98.2°F | Wt 169.5 lb

## 2014-09-04 DIAGNOSIS — O0993 Supervision of high risk pregnancy, unspecified, third trimester: Secondary | ICD-10-CM | POA: Diagnosis present

## 2014-09-04 LAB — POCT URINALYSIS DIP (DEVICE)
BILIRUBIN URINE: NEGATIVE
GLUCOSE, UA: NEGATIVE mg/dL
Hgb urine dipstick: NEGATIVE
Ketones, ur: NEGATIVE mg/dL
Nitrite: NEGATIVE
Protein, ur: NEGATIVE mg/dL
Specific Gravity, Urine: <=1.005 (ref 1.005–1.030)
Urobilinogen, UA: 0.2 mg/dL (ref 0.0–1.0)
pH: 5 (ref 5.0–8.0)

## 2014-09-04 NOTE — Patient Instructions (Signed)

## 2014-09-04 NOTE — Progress Notes (Signed)
Subjective:  Ana [name] is a 24 y.o. [id number] at [redacted]w[redacted]d being seen today for ongoing prenatal care.  Patient reports occasional contractions.  Contractions: Irregular.  Vag. Bleeding: None. Movement: Present. Denies leaking of fluid.   The following portions of the patient's history were reviewed and updated as appropriate: allergies, current medications, past family history, past medical history, past social history, past surgical history and problem list.   Objective:   Filed Vitals:   09/04/14 1259  BP: 124/63  Pulse: 75  Temp: 98.2 F (36.8 C)  Weight: 169 lb 8 oz (76.885 kg)    Fetal Status: Fetal Heart Rate (bpm): 156 Fundal Height: 31 cm Movement: Present     General:  Alert, oriented and cooperative. Patient is in no acute distress.  Skin: Skin is warm and dry. No rash noted.   Cardiovascular: Normal heart rate noted  Respiratory: Normal respiratory effort, no problems with respiration noted  Abdomen: Soft, gravid, appropriate for gestational age. Pain/Pressure: Present     Vaginal: Vag. Bleeding: None.       Cervix: Exam revealed        Extremities: Normal range of motion.  Edema: None  Mental Status: Normal mood and affect. Normal behavior. Normal judgment and thought content.   Urinalysis: Urine Protein: Negative Urine Glucose: Negative  Assessment and Plan:  Pregnancy: [id number] at [redacted]w[redacted]d  1. Supervision of high risk pregnancy, antepartum, third trimester BH ctx  Preterm labor symptoms and general obstetric precautions including but not limited to vaginal bleeding, contractions, leaking of fluid and fetal movement were reviewed in detail with the patient.  Please refer to After Visit Summary for other counseling recommendations.   Return in about 2 weeks (around 09/18/2014).   Willodean Rosenthalarolyn Harraway-Smith, MD

## 2014-09-11 ENCOUNTER — Encounter (HOSPITAL_COMMUNITY): Payer: Self-pay | Admitting: *Deleted

## 2014-09-11 ENCOUNTER — Inpatient Hospital Stay (HOSPITAL_COMMUNITY)
Admission: AD | Admit: 2014-09-11 | Discharge: 2014-09-11 | Disposition: A | Discharge status: Home / Self Care | Payer: Medicaid Other | Source: Ambulatory Visit | Attending: Family Medicine | Admitting: Family Medicine

## 2014-09-11 DIAGNOSIS — N390 Urinary tract infection, site not specified: Secondary | ICD-10-CM

## 2014-09-11 DIAGNOSIS — M549 Dorsalgia, unspecified: Secondary | ICD-10-CM | POA: Diagnosis present

## 2014-09-11 DIAGNOSIS — Z87891 Personal history of nicotine dependence: Secondary | ICD-10-CM | POA: Insufficient documentation

## 2014-09-11 DIAGNOSIS — O9989 Other specified diseases and conditions complicating pregnancy, childbirth and the puerperium: Secondary | ICD-10-CM | POA: Diagnosis not present

## 2014-09-11 DIAGNOSIS — O2343 Unspecified infection of urinary tract in pregnancy, third trimester: Secondary | ICD-10-CM

## 2014-09-11 DIAGNOSIS — Z3A32 32 weeks gestation of pregnancy: Secondary | ICD-10-CM | POA: Insufficient documentation

## 2014-09-11 LAB — URINALYSIS, ROUTINE W REFLEX MICROSCOPIC
BILIRUBIN URINE: NEGATIVE
Glucose, UA: NEGATIVE mg/dL
Ketones, ur: 15 mg/dL — AB
Nitrite: POSITIVE — AB
Protein, ur: 30 mg/dL — AB
Specific Gravity, Urine: 1.02 (ref 1.005–1.030)
UROBILINOGEN UA: 2 mg/dL — AB (ref 0.0–1.0)
pH: 6.5 (ref 5.0–8.0)

## 2014-09-11 LAB — WET PREP, GENITAL
Clue Cells Wet Prep HPF POC: NONE SEEN
Trich, Wet Prep: NONE SEEN
Yeast Wet Prep HPF POC: NONE SEEN

## 2014-09-11 LAB — CBC WITH DIFFERENTIAL/PLATELET
Basophils Absolute: 0 10*3/uL (ref 0.0–0.1)
Basophils Relative: 0 % (ref 0–1)
EOS PCT: 1 % (ref 0–5)
Eosinophils Absolute: 0.1 10*3/uL (ref 0.0–0.7)
HEMATOCRIT: 29.3 % — AB (ref 36.0–46.0)
HEMOGLOBIN: 9.5 g/dL — AB (ref 12.0–15.0)
LYMPHS ABS: 1.1 10*3/uL (ref 0.7–4.0)
LYMPHS PCT: 12 % (ref 12–46)
MCH: 28.6 pg (ref 26.0–34.0)
MCHC: 32.4 g/dL (ref 30.0–36.0)
MCV: 88.3 fL (ref 78.0–100.0)
MONOS PCT: 4 % (ref 3–12)
Monocytes Absolute: 0.4 10*3/uL (ref 0.1–1.0)
NEUTROS ABS: 7.5 10*3/uL (ref 1.7–7.7)
Neutrophils Relative %: 83 % — ABNORMAL HIGH (ref 43–77)
Platelets: 188 10*3/uL (ref 150–400)
RBC: 3.32 MIL/uL — ABNORMAL LOW (ref 3.87–5.11)
RDW: 13 % (ref 11.5–15.5)
WBC: 9.1 10*3/uL (ref 4.0–10.5)

## 2014-09-11 LAB — URINE MICROSCOPIC-ADD ON

## 2014-09-11 MED ORDER — OXYCODONE-ACETAMINOPHEN 5-325 MG PO TABS: 1.0000 | ORAL_TABLET | ORAL | Status: DC | PRN

## 2014-09-11 MED ORDER — TRAMADOL HCL 50 MG PO TABS
100.0000 mg | ORAL_TABLET | Freq: Once | ORAL | Status: AC
Start: 2014-09-11 — End: 2014-09-11
  Administered 2014-09-11: 100 mg via ORAL
  Filled 2014-09-11: qty 2

## 2014-09-11 MED ORDER — OXYCODONE-ACETAMINOPHEN 5-325 MG PO TABS
1.0000 | ORAL_TABLET | Freq: Once | ORAL | Status: AC
  Administered 2014-09-11: 1 via ORAL
  Filled 2014-09-11: qty 1

## 2014-09-11 MED ORDER — CEFTRIAXONE SODIUM IN DEXTROSE 20 MG/ML IV SOLN
1.0000 g | Freq: Two times a day (BID) | INTRAVENOUS | Status: DC
  Administered 2014-09-11: 1 g via INTRAVENOUS
  Filled 2014-09-11 (×2): qty 50

## 2014-09-11 MED ORDER — ONDANSETRON HCL 4 MG/2ML IJ SOLN
4.0000 mg | Freq: Once | INTRAMUSCULAR | Status: AC | PRN
Start: 2014-09-11 — End: 2014-09-11
  Administered 2014-09-11: 4 mg via INTRAVENOUS
  Filled 2014-09-11: qty 2

## 2014-09-11 MED ORDER — CEFADROXIL 500 MG/5ML PO SUSR: 500.0000 mg | Freq: Two times a day (BID) | ORAL | Status: DC

## 2014-09-11 MED ORDER — ONDANSETRON 8 MG PO TBDP: 8.0000 mg | ORAL_TABLET | Freq: Three times a day (TID) | ORAL | Status: DC | PRN

## 2014-09-11 MED ORDER — SODIUM CHLORIDE 0.9 % IV BOLUS (SEPSIS)
1000.0000 mL | Freq: Once | INTRAVENOUS | Status: AC
  Administered 2014-09-11: 1000 mL via INTRAVENOUS

## 2014-09-11 NOTE — Progress Notes (Signed)
Wet prep obtained without using spec

## 2014-09-11 NOTE — Progress Notes (Signed)
OK to d/c EFM per Ivonne AndrewV. Smith CNM. Strip reviewed by provider

## 2014-09-11 NOTE — Discharge Instructions (Signed)
Pregnancy and Urinary Tract Infection °A urinary tract infection (UTI) is a bacterial infection of the urinary tract. Infection of the urinary tract can include the ureters, kidneys (pyelonephritis), bladder (cystitis), and urethra (urethritis). All pregnant women should be screened for bacteria in the urinary tract. Identifying and treating a UTI will decrease the risk of preterm labor and developing more serious infections in both the mother and baby. °CAUSES °Bacteria germs cause almost all UTIs.  °RISK FACTORS °Many factors can increase your chances of getting a UTI during pregnancy. These include: °· Having a short urethra. °· Poor toilet and hygiene habits. °· Sexual intercourse. °· Blockage of urine along the urinary tract. °· Problems with the pelvic muscles or nerves. °· Diabetes. °· Obesity. °· Bladder problems after having several children. °· Previous history of UTI. °SIGNS AND SYMPTOMS  °· Pain, burning, or a stinging feeling when urinating. °· Suddenly feeling the need to urinate right away (urgency). °· Loss of bladder control (urinary incontinence). °· Frequent urination, more than is common with pregnancy. °· Lower abdominal or back discomfort. °· Cloudy urine. °· Blood in the urine (hematuria). °· Fever.  °When the kidneys are infected, the symptoms may be: °· Back pain. °· Flank pain on the right side more so than the left. °· Fever. °· Chills. °· Nausea. °· Vomiting. °DIAGNOSIS  °A urinary tract infection is usually diagnosed through urine tests. Additional tests and procedures are sometimes done. These may include: °· Ultrasound exam of the kidneys, ureters, bladder, and urethra. °· Looking in the bladder with a lighted tube (cystoscopy). °TREATMENT °Typically, UTIs can be treated with antibiotic medicines.  °HOME CARE INSTRUCTIONS  °· Only take over-the-counter or prescription medicines as directed by your health care provider. If you were prescribed antibiotics, take them as directed. Finish  them even if you start to feel better. °· Drink enough fluids to keep your urine clear or pale yellow. °· Do not have sexual intercourse until the infection is gone and your health care provider says it is okay. °· Make sure you are tested for UTIs throughout your pregnancy. These infections often come back.  °Preventing a UTI in the Future °· Practice good toilet habits. Always wipe from front to back. Use the tissue only once. °· Do not hold your urine. Empty your bladder as soon as possible when the urge comes. °· Do not douche or use deodorant sprays. °· Wash with soap and warm water around the genital area and the anus. °· Empty your bladder before and after sexual intercourse. °· Wear underwear with a cotton crotch. °· Avoid caffeine and carbonated drinks. They can irritate the bladder. °· Drink cranberry juice or take cranberry pills. This may decrease the risk of getting a UTI. °· Do not drink alcohol. °· Keep all your appointments and tests as scheduled.  °SEEK MEDICAL CARE IF:  °· Your symptoms get worse. °· You are still having fevers 2 or more days after treatment begins. °· You have a rash. °· You feel that you are having problems with medicines prescribed. °· You have abnormal vaginal discharge. °SEEK IMMEDIATE MEDICAL CARE IF:  °· You have back or flank pain. °· You have chills. °· You have blood in your urine. °· You have nausea and vomiting. °· You have contractions of your uterus. °· You have a gush of fluid from the vagina. °MAKE SURE YOU: °· Understand these instructions.   °· Will watch your condition.   °· Will get help right away if you are not doing   well or get worse.  Document Released: 06/20/2010 Document Revised: 12/14/2012 Document Reviewed: 09/22/2012 [facility] Patient Information 2015 [place], [place]. This information is not intended to replace advice given to you by your health care provider. Make sure you discuss any questions you have with your health care  provider.  Pyelonephritis, Adult Pyelonephritis is a kidney infection. In general, there are 2 main types of pyelonephritis:  Infections that come on quickly without any warning (acute pyelonephritis).  Infections that persist for a long period of time (chronic pyelonephritis). CAUSES  Two main causes of pyelonephritis are:  Bacteria traveling from the bladder to the kidney. This is a problem especially in pregnant women. The urine in the bladder can become filled with bacteria from multiple causes, including:  Inflammation of the prostate gland (prostatitis).  Sexual intercourse in females.  Bladder infection (cystitis).  Bacteria traveling from the bloodstream to the tissue part of the kidney. Problems that may increase your risk of getting a kidney infection include:  Diabetes.  Kidney stones or bladder stones.  Cancer.  Catheters placed in the bladder.  Other abnormalities of the kidney or ureter. SYMPTOMS   Abdominal pain.  Pain in the side or flank area.  Fever.  Chills.  Upset stomach.  Blood in the urine (dark urine).  Frequent urination.  Strong or persistent urge to urinate.  Burning or stinging when urinating. DIAGNOSIS  Your caregiver may diagnose your kidney infection based on your symptoms. A urine sample may also be taken. TREATMENT  In general, treatment depends on how severe the infection is.   If the infection is mild and caught early, your caregiver may treat you with oral antibiotics and send you home.  If the infection is more severe, the bacteria may have gotten into the bloodstream. This will require intravenous (IV) antibiotics and a hospital stay. Symptoms may include:  High fever.  Severe flank pain.  Shaking chills.  Even after a hospital stay, your caregiver may require you to be on oral antibiotics for a period of time.  Other treatments may be required depending upon the cause of the infection. HOME CARE INSTRUCTIONS    Take your antibiotics as directed. Finish them even if you start to feel better.  Make an appointment to have your urine checked to make sure the infection is gone.  Drink enough fluids to keep your urine clear or pale yellow.  Take medicines for the bladder if you have urgency and frequency of urination as directed by your caregiver. SEEK IMMEDIATE MEDICAL CARE IF:   You have a fever or persistent symptoms for more than 2-3 days.  You have a fever and your symptoms suddenly get worse.  You are unable to take your antibiotics or fluids.  You develop shaking chills.  You experience extreme weakness or fainting.  There is no improvement after 2 days of treatment. MAKE SURE YOU:  Understand these instructions.  Will watch your condition.  Will get help right away if you are not doing well or get worse. Document Released: 02/23/2005 Document Revised: 08/25/2011 Document Reviewed: 07/30/2010 [facility] Patient Information 2015 [place], [place]. This information is not intended to replace advice given to you by your health care provider. Make sure you discuss any questions you have with your health care provider.

## 2014-09-11 NOTE — Progress Notes (Signed)
Dr Earlene PlaterWallace notified of pt's admission and status. Aware of hx of taking meds for uti, no ctxs, fhr reactive, aware of u/a results. Will see pt.

## 2014-09-11 NOTE — MAU Provider Note (Signed)
History     CSN: [id number]  Arrival date and time: 09/11/14 1155   Chief Complaint  Patient presents with  . Back Pain   HPI  Back pain x12 hours. Tried tylenol, macrobid, and procardia with no relief. Was given macrobid to prevent UTI, but was then told UA was clear at doctor's office and not to take it anymore.   Notes some extra clear mucoid discharge on toilet paper.   +FM, denies VB, notes a few irregular contractions  Rates pain 8/10 on pain scale, constant, improves w/ warm compress. No relationship to position. There are no aggravating factors.  Past Medical History  Diagnosis Date  . Chlamydia Jan 2014  . Abnormal Pap smear   . Asthma   . Kidney infection     Past Surgical History  Procedure Laterality Date  . Wisdom tooth extraction      Family History  Problem Relation Age of Onset  . Hypertension Mother   . Hypertension Maternal Grandmother     History  Substance Use Topics  . Smoking status: Former Smoker    Quit date: 04/25/2014  . Smokeless tobacco: Never Used  . Alcohol Use: No     Comment: occasional/not since pregnancy    Allergies: No Known Allergies  Facility-administered medications prior to admission  Medication Dose Route Frequency Provider Last Rate Last Dose  . hydroxyprogesterone caproate (DELALUTIN) 250 mg/mL injection 250 mg  250 mg Intramuscular Weekly Rhona Raider Stinson, DO   250 mg at 06/19/14 1327   Prescriptions prior to admission  Medication Sig Dispense Refill Last Dose  . acetaminophen (TYLENOL) 500 MG tablet Take 500 mg by mouth every 6 (six) hours as needed.   09/11/2014 at Unknown time  . calcium carbonate (TUMS - DOSED IN MG ELEMENTAL CALCIUM) 500 MG chewable tablet Chew 1 tablet by mouth 2 (two) times daily as needed for indigestion or heartburn.   Past Week at Unknown time  . NIFEdipine (PROCARDIA) 10 MG capsule Take 1 capsule (10 mg total) by mouth every 4 (four) hours as needed (contractions). 30 capsule 0 09/11/2014 at  Unknown time  . nitrofurantoin, macrocrystal-monohydrate, (MACROBID) 100 MG capsule Take 1 capsule (100 mg total) by mouth 2 (two) times daily. X 7 days 14 capsule 0 09/11/2014 at Unknown time  . Prenatal Vit-Fe Fumarate-FA (PRENATAL VITAMINS) 28-0.8 MG TABS Take 1 tablet by mouth daily. 90 tablet 1 09/11/2014 at Unknown time  . albuterol (PROVENTIL HFA;VENTOLIN HFA) 108 (90 BASE) MCG/ACT inhaler Inhale 2 puffs into the lungs every 4 (four) hours as needed for wheezing. (Patient not taking: Reported on 08/16/2014) 1 Inhaler 0 Unknown at Unknown time    Review of Systems  Constitutional: Negative for fever, chills and malaise/fatigue.  HENT: Negative for congestion.   Eyes: Negative for blurred vision and double vision.  Respiratory: Negative for cough and shortness of breath.   Cardiovascular: Negative for chest pain, palpitations, claudication and leg swelling.  Gastrointestinal: Positive for nausea (yesterday) and vomiting (threw up once yesterday). Negative for heartburn, abdominal pain, diarrhea and constipation.  Genitourinary: Positive for frequency. Negative for dysuria, urgency and hematuria.  Musculoskeletal: Positive for back pain (radiates to abdomen). Negative for myalgias.  Skin: Negative for itching and rash.  Neurological: Negative for dizziness, loss of consciousness and headaches.   Physical Exam   Blood pressure 120/67, pulse 96, temperature 98.7 F (37.1 C), resp. rate 18, height 5\' 4"  (1.626 m), weight 168 lb 12.8 oz (76.567 kg), last menstrual period 01/25/2014.  Physical Exam  Constitutional: She is oriented to person, place, and time. She appears well-developed and well-nourished. No distress.  HENT:  Head: Normocephalic and atraumatic.  Eyes: Conjunctivae and EOM are normal.  Neck: Normal range of motion. No thyromegaly present.  Cardiovascular: Normal rate, regular rhythm and normal heart sounds.  Exam reveals no gallop and no friction rub.   No murmur  heard. Respiratory: Breath sounds normal. No respiratory distress. She has no wheezes. She has no rales.  GI: Soft. Bowel sounds are normal. She exhibits no distension. There is no tenderness.  Musculoskeletal: Normal range of motion. She exhibits tenderness. She exhibits no edema.  CVA tenderness on R  Neurological: She is alert and oriented to person, place, and time.  Skin: Skin is warm and dry. No rash noted. No erythema.  Psychiatric: She has a normal mood and affect. Her behavior is normal.   EFM: 140, mod variability, 15x15 accels, few mild variables.  Toco: None  MAU Course  Procedures  MDM UA showed infection. Urine cx ordered. CBC ordered. Ultram given. Rocephin IV started.   No relief of pain w/ Ultram. #1 percocet given. Pain improved significantly. Tolerating POs in MAU.   Assessment and Plan  Patient is 24 y.o. [id number] 5353w5d reporting back pain likely secondary to mild pyelonephritis.  -Afebrile and lack of WBC points towards UTI, but CVA tenderness on exam, N/V, and hx of pyelonephritis is concerning for a case of mild pyelo. Since pt is able to tolerate po medicine and not clear that this is a definitive pyelo infection, can be discharged with close followup.  -Discharge to home with abx and followup in 2 days per consult w/ Dr. Adrian BlackwaterStinson.  Follow-up Information    [facility]   Specialty:  Obstetrics and Gynecology   Why:  for re-evaluation   Contact information:   [address] [place] [id number] [phone]      [facility]   Why:  for fever greater than 100.4 or if unable to keep down antibiotics.   Contact information:   [address] [id number] mc [place] [id number] [phone]       Medication List    STOP taking these medications        nitrofurantoin (macrocrystal-monohydrate) 100 MG capsule  Commonly known  as:  MACROBID      TAKE these medications        acetaminophen 500 MG tablet  Commonly known as:  TYLENOL  Take 500 mg by mouth every 6 (six) hours as needed.     albuterol 108 (90 BASE) MCG/ACT inhaler  Commonly known as:  PROVENTIL HFA;VENTOLIN HFA  Inhale 2 puffs into the lungs every 4 (four) hours as needed for wheezing.     cefadroxil 500 MG/5ML suspension  Commonly known as:  DURICEF  Take 5 mLs (500 mg total) by mouth 2 (two) times daily.     NIFEdipine 10 MG capsule  Commonly known as:  PROCARDIA  Take 1 capsule (10 mg total) by mouth every 4 (four) hours as needed (contractions).     ondansetron 8 MG disintegrating tablet  Commonly known as:  ZOFRAN ODT  Take 1 tablet (8 mg total) by mouth every 8 (eight) hours as needed for nausea or vomiting.     oxyCODONE-acetaminophen 5-325 MG per tablet  Commonly known as:  PERCOCET/ROXICET  Take 1-2 tablets by mouth every 4 (  four) hours as needed for severe pain.     Prenatal Vitamins 28-0.8 MG Tabs  Take 1 tablet by mouth daily.     ROLAIDS PO  Take 1 tablet by mouth daily as needed (heartrburn).       De Hollingshead 09/11/2014, 12:59 PM

## 2014-09-13 ENCOUNTER — Ambulatory Visit (INDEPENDENT_AMBULATORY_CARE_PROVIDER_SITE_OTHER): Payer: Medicaid Other | Admitting: Advanced Practice Midwife

## 2014-09-13 VITALS — BP 112/56 | HR 72 | Temp 98.5°F | Wt 172.6 lb

## 2014-09-13 DIAGNOSIS — O2343 Unspecified infection of urinary tract in pregnancy, third trimester: Secondary | ICD-10-CM | POA: Diagnosis present

## 2014-09-13 LAB — POCT URINALYSIS DIP (DEVICE)
Bilirubin Urine: NEGATIVE
Glucose, UA: NEGATIVE mg/dL
Hgb urine dipstick: NEGATIVE
Ketones, ur: NEGATIVE mg/dL
Nitrite: NEGATIVE
Protein, ur: NEGATIVE mg/dL
Specific Gravity, Urine: 1.025 (ref 1.005–1.030)
Urobilinogen, UA: 0.2 mg/dL (ref 0.0–1.0)
pH: 7 (ref 5.0–8.0)

## 2014-09-13 LAB — CULTURE, OB URINE: SPECIAL REQUESTS: NORMAL

## 2014-09-13 NOTE — Progress Notes (Signed)
Subjective:  Ana [name] is a 24 y.o. [phone] at 5983w0d being seen today for ongoing prenatal care and F/U after complicated UTI vs mild early pyelo.  Patient reports no complaints and flank pain, N/V and urinary complaints resolved. .   .   .  . Denies leaking of fluid.   The following portions of the patient's history were reviewed and updated as appropriate: allergies, current medications, past family history, past medical history, past social history, past surgical history and problem list.   Objective:   Filed Vitals:   09/13/14 1430  BP: 112/56  Pulse: 72  Temp: 98.5 F (36.9 C)  Weight: 172 lb 9.6 oz (78.291 kg)    Fetal Status:         FHR 147  General:  Alert, oriented and cooperative. Patient is in no acute distress.  Skin: Skin is warm and dry. No rash noted.   Cardiovascular: Normal heart rate noted  Respiratory: Normal respiratory effort, no problems with respiration noted  Abdomen: Soft, gravid, appropriate for gestational age.       Vaginal:  .       Cervix: Not evaluated        Extremities: Normal range of motion.     Mental Status: Normal mood and affect. Normal behavior. Normal judgment and thought content.   Urinalysis: Urine Protein: Negative Urine Glucose: Negative  Assessment and Plan:  Pregnancy: [id number] at 5483w0d  There are no diagnoses linked to this encounter.  Preterm labor symptoms and general obstetric precautions including but not limited to vaginal bleeding, contractions, leaking of fluid and fetal movement were reviewed in detail with the patient.  Please refer to After Visit Summary for other counseling recommendations.   F/U 12 weeks.    Dorathy KinsmanVirginia Aayat Hajjar, CNM

## 2014-09-13 NOTE — Patient Instructions (Signed)
Pregnancy and Urinary Tract Infection °A urinary tract infection (UTI) is a bacterial infection of the urinary tract. Infection of the urinary tract can include the ureters, kidneys (pyelonephritis), bladder (cystitis), and urethra (urethritis). All pregnant women should be screened for bacteria in the urinary tract. Identifying and treating a UTI will decrease the risk of preterm labor and developing more serious infections in both the mother and baby. °CAUSES °Bacteria germs cause almost all UTIs.  °RISK FACTORS °Many factors can increase your chances of getting a UTI during pregnancy. These include: °· Having a short urethra. °· Poor toilet and hygiene habits. °· Sexual intercourse. °· Blockage of urine along the urinary tract. °· Problems with the pelvic muscles or nerves. °· Diabetes. °· Obesity. °· Bladder problems after having several children. °· Previous history of UTI. °SIGNS AND SYMPTOMS  °· Pain, burning, or a stinging feeling when urinating. °· Suddenly feeling the need to urinate right away (urgency). °· Loss of bladder control (urinary incontinence). °· Frequent urination, more than is common with pregnancy. °· Lower abdominal or back discomfort. °· Cloudy urine. °· Blood in the urine (hematuria). °· Fever.  °When the kidneys are infected, the symptoms may be: °· Back pain. °· Flank pain on the right side more so than the left. °· Fever. °· Chills. °· Nausea. °· Vomiting. °DIAGNOSIS  °A urinary tract infection is usually diagnosed through urine tests. Additional tests and procedures are sometimes done. These may include: °· Ultrasound exam of the kidneys, ureters, bladder, and urethra. °· Looking in the bladder with a lighted tube (cystoscopy). °TREATMENT °Typically, UTIs can be treated with antibiotic medicines.  °HOME CARE INSTRUCTIONS  °· Only take over-the-counter or prescription medicines as directed by your health care provider. If you were prescribed antibiotics, take them as directed. Finish  them even if you start to feel better. °· Drink enough fluids to keep your urine clear or pale yellow. °· Do not have sexual intercourse until the infection is gone and your health care provider says it is okay. °· Make sure you are tested for UTIs throughout your pregnancy. These infections often come back.  °Preventing a UTI in the Future °· Practice good toilet habits. Always wipe from front to back. Use the tissue only once. °· Do not hold your urine. Empty your bladder as soon as possible when the urge comes. °· Do not douche or use deodorant sprays. °· Wash with soap and warm water around the genital area and the anus. °· Empty your bladder before and after sexual intercourse. °· Wear underwear with a cotton crotch. °· Avoid caffeine and carbonated drinks. They can irritate the bladder. °· Drink cranberry juice or take cranberry pills. This may decrease the risk of getting a UTI. °· Do not drink alcohol. °· Keep all your appointments and tests as scheduled.  °SEEK MEDICAL CARE IF:  °· Your symptoms get worse. °· You are still having fevers 2 or more days after treatment begins. °· You have a rash. °· You feel that you are having problems with medicines prescribed. °· You have abnormal vaginal discharge. °SEEK IMMEDIATE MEDICAL CARE IF:  °· You have back or flank pain. °· You have chills. °· You have blood in your urine. °· You have nausea and vomiting. °· You have contractions of your uterus. °· You have a gush of fluid from the vagina. °MAKE SURE YOU: °· Understand these instructions.   °· Will watch your condition.   °· Will get help right away if you are not doing   well or get worse.   °Document Released: 06/20/2010 Document Revised: 12/14/2012 Document Reviewed: 09/22/2012 °ExitCare® Patient Information ©2015 [employer]. This information is not intended to replace advice given to you by your health care provider. Make sure you discuss any questions you have with your health care provider. ° °

## 2014-09-19 ENCOUNTER — Encounter: Payer: Medicaid Other | Admitting: Obstetrics and Gynecology

## 2014-09-26 ENCOUNTER — Ambulatory Visit (INDEPENDENT_AMBULATORY_CARE_PROVIDER_SITE_OTHER): Payer: Medicaid Other | Admitting: Certified Nurse Midwife

## 2014-09-26 VITALS — BP 125/49 | HR 69 | Temp 98.0°F | Wt 175.0 lb

## 2014-09-26 DIAGNOSIS — O0993 Supervision of high risk pregnancy, unspecified, third trimester: Secondary | ICD-10-CM | POA: Diagnosis not present

## 2014-09-26 LAB — POCT URINALYSIS DIP (DEVICE)
Bilirubin Urine: NEGATIVE
Glucose, UA: NEGATIVE mg/dL
Hgb urine dipstick: NEGATIVE
KETONES UR: NEGATIVE mg/dL
NITRITE: NEGATIVE
Protein, ur: NEGATIVE mg/dL
Specific Gravity, Urine: 1.025 (ref 1.005–1.030)
UROBILINOGEN UA: 1 mg/dL (ref 0.0–1.0)
pH: 7 (ref 5.0–8.0)

## 2014-09-26 NOTE — Progress Notes (Signed)
Subjective:  Ana [name] is a 24 y.o. [id number] at 10565w6d being seen today for ongoing prenatal care.  Patient reports no complaints.  Contractions: Not present.  Vag. Bleeding: None. Movement: Present. Denies leaking of fluid.   The following portions of the patient's history were reviewed and updated as appropriate: allergies, current medications, past family history, past medical history, past social history, past surgical history and problem list.   Objective:   Filed Vitals:   09/26/14 0806  BP: 125/49  Pulse: 69  Temp: 98 F (36.7 C)  Weight: 175 lb (79.379 kg)    Fetal Status: Fetal Heart Rate (bpm): 140   Movement: Present     General:  Alert, oriented and cooperative. Patient is in no acute distress.  Skin: Skin is warm and dry. No rash noted.   Cardiovascular: Normal heart rate noted  Respiratory: Normal respiratory effort, no problems with respiration noted  Abdomen: Soft, gravid, appropriate for gestational age. Pain/Pressure: Present     Vaginal: Vag. Bleeding: None.    Vag D/C Character: White  Cervix: Not evaluated        Extremities: Normal range of motion.  Edema: Trace  Mental Status: Normal mood and affect. Normal behavior. Normal judgment and thought content.   Urinalysis:      Assessment and Plan:  Pregnancy: [id number] at 8265w6d  There are no diagnoses linked to this encounter. Preterm labor symptoms and general obstetric precautions including but not limited to vaginal bleeding, contractions, leaking of fluid and fetal movement were reviewed in detail with the patient. Please refer to After Visit Summary for other counseling recommendations.  RTO 1 week  Rhea PinkLori A Damira Kem, CNM

## 2014-09-26 NOTE — Patient Instructions (Signed)
Third Trimester of Pregnancy The third trimester is from week 29 through week 42, months 7 through 9. The third trimester is a time when the fetus is growing rapidly. At the end of the ninth month, the fetus is about 20 inches in length and weighs 6-10 pounds.  BODY CHANGES Your body goes through many changes during pregnancy. The changes vary from woman to woman.   Your weight will continue to increase. You can expect to gain 25-35 pounds (11-16 kg) by the end of the pregnancy.  You may begin to get stretch marks on your hips, abdomen, and breasts.  You may urinate more often because the fetus is moving lower into your pelvis and pressing on your bladder.  You may develop or continue to have heartburn as a result of your pregnancy.  You may develop constipation because certain hormones are causing the muscles that push waste through your intestines to slow down.  You may develop hemorrhoids or swollen, bulging veins (varicose veins).  You may have pelvic pain because of the weight gain and pregnancy hormones relaxing your joints between the bones in your pelvis. Backaches may result from overexertion of the muscles supporting your posture.  You may have changes in your hair. These can include thickening of your hair, rapid growth, and changes in texture. Some women also have hair loss during or after pregnancy, or hair that feels dry or thin. Your hair will most likely return to normal after your baby is born.  Your breasts will continue to grow and be tender. A yellow discharge may leak from your breasts called colostrum.  Your belly button may stick out.  You may feel short of breath because of your expanding uterus.  You may notice the fetus "dropping," or moving lower in your abdomen.  You may have a bloody mucus discharge. This usually occurs a few days to a week before labor begins.  Your cervix becomes thin and soft (effaced) near your due date. WHAT TO EXPECT AT YOUR PRENATAL  EXAMS  You will have prenatal exams every 2 weeks until week 36. Then, you will have weekly prenatal exams. During a routine prenatal visit:  You will be weighed to make sure you and the fetus are growing normally.  Your blood pressure is taken.  Your abdomen will be measured to track your baby's growth.  The fetal heartbeat will be listened to.  Any test results from the previous visit will be discussed.  You may have a cervical check near your due date to see if you have effaced. At around 36 weeks, your caregiver will check your cervix. At the same time, your caregiver will also perform a test on the secretions of the vaginal tissue. This test is to determine if a type of bacteria, Group B streptococcus, is present. Your caregiver will explain this further. Your caregiver may ask you:  What your birth plan is.  How you are feeling.  If you are feeling the baby move.  If you have had any abnormal symptoms, such as leaking fluid, bleeding, severe headaches, or abdominal cramping.  If you have any questions. Other tests or screenings that may be performed during your third trimester include:  Blood tests that check for low iron levels (anemia).  Fetal testing to check the health, activity level, and growth of the fetus. Testing is done if you have certain medical conditions or if there are problems during the pregnancy. FALSE LABOR You may feel small, irregular contractions that   eventually go away. These are called Braxton Hicks contractions, or false labor. Contractions may last for hours, days, or even weeks before true labor sets in. If contractions come at regular intervals, intensify, or become painful, it is best to be seen by your caregiver.  SIGNS OF LABOR   Menstrual-like cramps.  Contractions that are 5 minutes apart or less.  Contractions that start on the top of the uterus and spread down to the lower abdomen and back.  A sense of increased pelvic pressure or back  pain.  A watery or bloody mucus discharge that comes from the vagina. If you have any of these signs before the 37th week of pregnancy, call your caregiver right away. You need to go to the hospital to get checked immediately. HOME CARE INSTRUCTIONS   Avoid all smoking, herbs, alcohol, and unprescribed drugs. These chemicals affect the formation and growth of the baby.  Follow your caregiver's instructions regarding medicine use. There are medicines that are either safe or unsafe to take during pregnancy.  Exercise only as directed by your caregiver. Experiencing uterine cramps is a good sign to stop exercising.  Continue to eat regular, healthy meals.  Wear a good support bra for breast tenderness.  Do not use hot tubs, steam rooms, or saunas.  Wear your seat belt at all times when driving.  Avoid raw meat, uncooked cheese, cat litter boxes, and soil used by cats. These carry germs that can cause birth defects in the baby.  Take your prenatal vitamins.  Try taking a stool softener (if your caregiver approves) if you develop constipation. Eat more high-fiber foods, such as fresh vegetables or fruit and whole grains. Drink plenty of fluids to keep your urine clear or pale yellow.  Take warm sitz baths to soothe any pain or discomfort caused by hemorrhoids. Use hemorrhoid cream if your caregiver approves.  If you develop varicose veins, wear support hose. Elevate your feet for 15 minutes, 3-4 times a day. Limit salt in your diet.  Avoid heavy lifting, wear low heal shoes, and practice good posture.  Rest a lot with your legs elevated if you have leg cramps or low back pain.  Visit your dentist if you have not gone during your pregnancy. Use a soft toothbrush to brush your teeth and be gentle when you floss.  A sexual relationship may be continued unless your caregiver directs you otherwise.  Do not travel far distances unless it is absolutely necessary and only with the approval  of your caregiver.  Take prenatal classes to understand, practice, and ask questions about the labor and delivery.  Make a trial run to the hospital.  Pack your hospital bag.  Prepare the baby's nursery.  Continue to go to all your prenatal visits as directed by your caregiver. SEEK MEDICAL CARE IF:  You are unsure if you are in labor or if your water has broken.  You have dizziness.  You have mild pelvic cramps, pelvic pressure, or nagging pain in your abdominal area.  You have persistent nausea, vomiting, or diarrhea.  You have a bad smelling vaginal discharge.  You have pain with urination. SEEK IMMEDIATE MEDICAL CARE IF:   You have a fever.  You are leaking fluid from your vagina.  You have spotting or bleeding from your vagina.  You have severe abdominal cramping or pain.  You have rapid weight loss or gain.  You have shortness of breath with chest pain.  You notice sudden or extreme swelling   of your face, hands, ankles, feet, or legs.  You have not felt your baby move in over an hour.  You have severe headaches that do not go away with medicine.  You have vision changes. Document Released: 02/17/2001 Document Revised: 02/28/2013 Document Reviewed: 04/26/2012 ExitCare Patient Information 2015 [employer]. This information is not intended to replace advice given to you by your health care provider. Make sure you discuss any questions you have with your health care provider.  

## 2014-10-03 ENCOUNTER — Ambulatory Visit (INDEPENDENT_AMBULATORY_CARE_PROVIDER_SITE_OTHER): Payer: Medicaid Other | Admitting: Advanced Practice Midwife

## 2014-10-03 VITALS — BP 121/66 | HR 67 | Wt 177.6 lb

## 2014-10-03 DIAGNOSIS — O0993 Supervision of high risk pregnancy, unspecified, third trimester: Secondary | ICD-10-CM | POA: Diagnosis not present

## 2014-10-03 LAB — POCT URINALYSIS DIP (DEVICE)
BILIRUBIN URINE: NEGATIVE
Glucose, UA: NEGATIVE mg/dL
Hgb urine dipstick: NEGATIVE
Ketones, ur: NEGATIVE mg/dL
Nitrite: NEGATIVE
PH: 7 (ref 5.0–8.0)
Protein, ur: NEGATIVE mg/dL
Specific Gravity, Urine: 1.02 (ref 1.005–1.030)
Urobilinogen, UA: 1 mg/dL (ref 0.0–1.0)

## 2014-10-03 NOTE — Progress Notes (Signed)
Subjective:  Ana [name] is a 24 y.o. [id number] at [redacted]w[redacted]d being seen today for ongoing prenatal care.  Patient reports no complaints.  Contractions: Irregular.  Vag. Bleeding: None. Movement: Present. Denies leaking of fluid.   The following portions of the patient's history were reviewed and updated as appropriate: allergies, current medications, past family history, past medical history, past social history, past surgical history and problem list.   Objective:   Filed Vitals:   10/03/14 0956  BP: 121/66  Pulse: 67  Weight: 177 lb 9.6 oz (80.559 kg)    Fetal Status: Fetal Heart Rate (bpm): 132 Fundal Height: 34 cm Movement: Present     General:  Alert, oriented and cooperative. Patient is in no acute distress.  Skin: Skin is warm and dry. No rash noted.   Cardiovascular: Normal heart rate noted  Respiratory: Normal respiratory effort, no problems with respiration noted  Abdomen: Soft, gravid, appropriate for gestational age. Pain/Pressure: Present     Vaginal: Vag. Bleeding: None.       Cervix: Not evaluated        Extremities: Normal range of motion.  Edema: Trace  Mental Status: Normal mood and affect. Normal behavior. Normal judgment and thought content.   Urinalysis:      Assessment and Plan:  Pregnancy: [id number] at [redacted]w[redacted]d  1. Supervision of high risk pregnancy, antepartum, third trimester   Term labor symptoms and general obstetric precautions including but not limited to vaginal bleeding, contractions, leaking of fluid and fetal movement were reviewed in detail with the patient. Discussed GBS/cultures at next visit.    Please refer to After Visit Summary for other counseling recommendations.  Return in about 1 week (around 10/10/2014).   Hurshel Party, CNM

## 2014-10-04 ENCOUNTER — Encounter: Payer: Medicaid Other | Admitting: Obstetrics & Gynecology

## 2014-10-05 ENCOUNTER — Inpatient Hospital Stay (HOSPITAL_COMMUNITY)
Admission: AD | Admit: 2014-10-05 | Discharge: 2014-10-05 | Disposition: A | Discharge status: Home / Self Care | Payer: Medicaid Other | Source: Ambulatory Visit | Attending: Obstetrics & Gynecology | Admitting: Obstetrics & Gynecology

## 2014-10-05 ENCOUNTER — Encounter (HOSPITAL_COMMUNITY): Payer: Self-pay

## 2014-10-05 DIAGNOSIS — N949 Unspecified condition associated with female genital organs and menstrual cycle: Secondary | ICD-10-CM

## 2014-10-05 DIAGNOSIS — R102 Pelvic and perineal pain: Secondary | ICD-10-CM | POA: Insufficient documentation

## 2014-10-05 DIAGNOSIS — R109 Unspecified abdominal pain: Secondary | ICD-10-CM | POA: Diagnosis present

## 2014-10-05 DIAGNOSIS — Z3A36 36 weeks gestation of pregnancy: Secondary | ICD-10-CM | POA: Insufficient documentation

## 2014-10-05 DIAGNOSIS — Z87891 Personal history of nicotine dependence: Secondary | ICD-10-CM | POA: Diagnosis not present

## 2014-10-05 DIAGNOSIS — O9989 Other specified diseases and conditions complicating pregnancy, childbirth and the puerperium: Secondary | ICD-10-CM | POA: Diagnosis not present

## 2014-10-05 MED ORDER — OXYCODONE-ACETAMINOPHEN 5-325 MG PO TABS
2.0000 | ORAL_TABLET | Freq: Once | ORAL | Status: AC
  Administered 2014-10-05: 2 via ORAL
  Filled 2014-10-05: qty 2

## 2014-10-05 MED ORDER — OXYCODONE-ACETAMINOPHEN 5-325 MG PO TABS: 1.0000 | ORAL_TABLET | ORAL | Status: DC | PRN

## 2014-10-05 NOTE — MAU Provider Note (Signed)
History     CSN: [id number]  Arrival date and time: 10/05/14 1321   First Provider Initiated Contact with Patient 10/05/14 1412      Chief Complaint  Patient presents with  . Labor Eval   HPI Comments: Pt is a 24 yo f who presents to the MAU w/ complaints of abd pain which began around 1000 this morning. She states that the pain is mostly a sharp pressure in her lower pelvis which radiates to her thighs and back. SHe states that it reminds her of her previous spontaneous abortion, though at that time she had more consistent contractions. Now she complains of cramping pain. She reports no VB/LOF and no change in urination. She is having mild nausea that is concurrent w/ the pain.   OB History    Gravida Para Term Preterm AB TAB SAB Ectopic Multiple Living   6 4 4  0 1 0 1 0 0 4      Past Medical History  Diagnosis Date  . Chlamydia Jan 2014  . Abnormal Pap smear   . Asthma   . Kidney infection     Past Surgical History  Procedure Laterality Date  . Wisdom tooth extraction      Family History  Problem Relation Age of Onset  . Hypertension Mother   . Hypertension Maternal Grandmother     History  Substance Use Topics  . Smoking status: Former Smoker    Quit date: 04/25/2014  . Smokeless tobacco: Never Used  . Alcohol Use: No     Comment: occasional/not since pregnancy    Allergies: No Known Allergies  Facility-administered medications prior to admission  Medication Dose Route Frequency Provider Last Rate Last Dose  . hydroxyprogesterone caproate (DELALUTIN) 250 mg/mL injection 250 mg  250 mg Intramuscular Weekly Rhona Raider Stinson, DO   250 mg at 06/19/14 1327   Prescriptions prior to admission  Medication Sig Dispense Refill Last Dose  . Prenatal Vit-Fe Fumarate-FA (PRENATAL VITAMINS) 28-0.8 MG TABS Take 1 tablet by mouth daily. 90 tablet 1 10/04/2014 at Unknown time  . albuterol (PROVENTIL HFA;VENTOLIN HFA) 108 (90 BASE) MCG/ACT inhaler Inhale 2 puffs into the  lungs every 4 (four) hours as needed for wheezing. 1 Inhaler 0 rescue    Review of Systems  Constitutional: Negative for fever.  Respiratory: Negative for shortness of breath.   Cardiovascular: Negative for chest pain and leg swelling.  Gastrointestinal: Positive for nausea and abdominal pain. Negative for vomiting, diarrhea and constipation.  Genitourinary: Negative for urgency, frequency and hematuria.  Musculoskeletal: Positive for back pain.  Neurological: Positive for headaches. Negative for weakness.   Physical Exam   Blood pressure 123/69, pulse 71, temperature 98.4 F (36.9 C), temperature source Oral, resp. rate 20, height 5\' 4"  (1.626 m), weight 79.379 kg (175 lb), last menstrual period 01/25/2014.  Physical Exam  Constitutional: She is oriented to person, place, and time. She appears well-developed and well-nourished.  HENT:  Head: Normocephalic and atraumatic.  Eyes: Conjunctivae and EOM are normal.  Neck: Normal range of motion. No tracheal deviation present.  Cardiovascular: Normal rate, regular rhythm and normal heart sounds.   Respiratory: Effort normal and breath sounds normal. She has no wheezes. She has no rales. She exhibits no tenderness.  GI: Soft. She exhibits no distension and no mass. There is tenderness. There is no rebound and no guarding.  Musculoskeletal: Normal range of motion.  Neurological: She is alert and oriented to person, place, and time.  Skin: Skin is  warm and dry.  Psychiatric: She has a normal mood and affect. Her behavior is normal. Judgment and thought content normal.  Dilation: 2 Effacement (%): Thick Cervical Position: Posterior Station: -3 Exam by:: Judeth Horn RN FHR 130s; mod varia; + acels; - decels; uterine irritability w/o contractions  MAU Course  Procedures  MDM Exam NST 120-130/mod/+accels/no decels, monitored from 13:32- 17:30 Toco: occasional ctx  Assessment and Plan  Pt is a 24 yo f [id number] at [redacted]w[redacted]d who presents  to the MAU w/ complaints of lower abd pressure, cramping, and pain.  Abd pain #DDx includes preterm labor, uterine irritability, braxton-hicks, impending abortion #pt given percocets for pain and monitored approx 1.5 hrs w/o any contractions noted; strip was reassurring throughout #there was no change in cervical exam during this time as well #she stated that the pain did not resolve and became increasingly aggitated w/ concerns of being ignored #Dr. Alvester Morin called in order to address pain and goals of care; nurse manager also spoke w/ pt  Lowanda Foster 10/05/2014, 4:45 PM   ------------------------------------------ 10/05/2014 5:24 PM I was called to MAU to address patient's concerns. The nurse manager Okey Regal was at beside with patient when I arrive. I discussed the reason for the patient's visit. She reported some frustration with use not addressing her pain but also discussed with me that she does not want to "take those medications that just make her sleepy and that with an epidural all the pain down there is gone." She reports sharp pains that come from her hips and radiate to her groin. She does not feel these are contractions which she has experienced. She reports these are sharper and do not come in waves.  She feels that these are similar to her previous 2nd trimester miscarriage. She is worried we are "just drugging her baby."  BP 123/69 mmHg  Pulse 71  Temp(Src) 98.4 F (36.9 C) (Oral)  Resp 20  Ht  (1.626 m)  Wt 175 lb (79.379 kg)  BMI 30.02 kg/m2  LMP 01/25/2014 (Exact Date)  Physical exam Gen: speaking in complete sentences. Makes relatively poor eye contact. She is comfortably resting in bed. Able to move freely and easily CV: RR Pulm: normal respiratory effort Abdomen: gravid. Not tender or distended.  GU: 2/thich/-3, very posterior cervix.  Ext: moves all extremities.   Assessment: likely round ligament pain vs prodromal labor.  Plan:  - Patient will try  additional dose of percocet. She reported "if this doesn't work I want to go home" - She refused flexeril - I discussed that we do not place epidurals for pain control nor routinely give IV pain medications. She was in agreement that she does not want these. - We discussed that she is not in preterm labor but I cannot predict when she will go into labor. We discussed labor precautions and she voiced understanding. We reviewed kick counts in detail and she voiced understanding.   Patient was more calm at the end of our conversation  Federico Flake, MD OB Fellow  10/05/2014 6:32 PM Patient feeling much better and desires/request discharge. I have placed this order and provided patient with prn percocet for her MSK.

## 2014-10-05 NOTE — MAU Note (Signed)
Cramping/contractions since this morning; feels like pelvis is "locking up". Doesn't know how frequent contracting. Denies LOF or vaginal bleeding. Decreased fetal movement today.

## 2014-10-05 NOTE — Discharge Instructions (Signed)
You were seen in at [facility] for the concern for labor pain. You were monitored for several hours and you cervix was checked multiple times. You are not currently in labor. We cannot predict when you will go into labor. Please return if you have regular strong contractions every 4-5 minutes for 1 hour, vaginal bleeding, loss of fluid, or decreased fetal movement.    Pelvic Pain Pelvic pain is pain felt below the belly button and between your hips. It can be caused by many different things. It is important to get help right away. This is especially true for severe, sharp, or unusual pain that comes on suddenly.  HOME CARE  Only take medicine as told by your doctor.  Rest as told by your doctor.  Eat a healthy diet, such as fruits, vegetables, and lean meats.  Drink enough fluids to keep your pee (urine) clear or pale yellow, or as told.  Avoid sex (intercourse) if it causes pain.  Apply warm or cold packs to your lower belly (abdomen). Use the type of pack that helps the pain.  Avoid situations that cause you stress.  Keep a journal to track your pain. Write down:  When the pain started.  Where it is located.  If there are things that seem to be related to the pain, such as food or your period.  Follow up with your doctor as told. GET HELP RIGHT AWAY IF:   You have heavy bleeding from the vagina.  You have more pelvic pain.  You feel lightheaded or pass out (faint).  You have chills.  You have pain when you pee or have blood in your pee.  You cannot stop having watery poop (diarrhea).  You cannot stop throwing up (vomiting).  You have a fever or lasting symptoms for more than 3 days.  You have a fever and your symptoms suddenly get worse.  You are being physically or sexually abused.  Your medicine does not help your pain.  You have fluid (discharge) coming from your vagina that is not normal. MAKE SURE YOU:  Understand these instructions.  Will watch  your condition.  Will get help if you are not doing well or get worse. Document Released: 08/12/2007 Document Revised: 08/25/2011 Document Reviewed: 06/15/2011 [facility] Patient Information 2015 [place], [place]. This information is not intended to replace advice given to you by your health care provider. Make sure you discuss any questions you have with your health care provider.

## 2014-10-08 ENCOUNTER — Other Ambulatory Visit: Payer: Self-pay | Admitting: Advanced Practice Midwife

## 2014-10-08 ENCOUNTER — Ambulatory Visit (INDEPENDENT_AMBULATORY_CARE_PROVIDER_SITE_OTHER): Payer: Medicaid Other | Admitting: Advanced Practice Midwife

## 2014-10-08 VITALS — BP 129/70 | HR 87 | Temp 98.0°F | Wt 180.0 lb

## 2014-10-08 DIAGNOSIS — O289 Unspecified abnormal findings on antenatal screening of mother: Secondary | ICD-10-CM

## 2014-10-08 DIAGNOSIS — O283 Abnormal ultrasonic finding on antenatal screening of mother: Secondary | ICD-10-CM

## 2014-10-08 DIAGNOSIS — Z3493 Encounter for supervision of normal pregnancy, unspecified, third trimester: Secondary | ICD-10-CM

## 2014-10-08 DIAGNOSIS — O280 Abnormal hematological finding on antenatal screening of mother: Secondary | ICD-10-CM

## 2014-10-08 LAB — POCT URINALYSIS DIP (DEVICE)
BILIRUBIN URINE: NEGATIVE
GLUCOSE, UA: NEGATIVE mg/dL
Hgb urine dipstick: NEGATIVE
KETONES UR: NEGATIVE mg/dL
Nitrite: NEGATIVE
Protein, ur: NEGATIVE mg/dL
SPECIFIC GRAVITY, URINE: 1.02 (ref 1.005–1.030)
Urobilinogen, UA: 0.2 mg/dL (ref 0.0–1.0)
pH: 7 (ref 5.0–8.0)

## 2014-10-08 LAB — OB RESULTS CONSOLE GBS: GBS: NEGATIVE

## 2014-10-08 LAB — OB RESULTS CONSOLE GC/CHLAMYDIA
CHLAMYDIA, DNA PROBE: POSITIVE
GC PROBE AMP, GENITAL: NEGATIVE

## 2014-10-08 NOTE — Progress Notes (Signed)
Subjective:  Ana [name] is a 23 y.o. [id number] at [redacted]w[redacted]d being seen today for ongoing prenatal care.  Patient reports no contractions and no leaking.  Contractions: Irregular.  Vag. Bleeding: None. Movement: Present. Denies leaking of fluid.   The following portions of the patient's history were reviewed and updated as appropriate: allergies, current medications, past family history, past medical history, past social history, past surgical history and problem list.   Upon review of chart pt had abd quad (DRS 1:90). NIPS recommended, declined. F/U US ordered, showed LVEIF. Was supposed to also have genetic counseling visit, but did not.    Objective:   Filed Vitals:   10/08/14 1539  BP: 129/70  Pulse: 87  Temp: 98 F (36.7 C)  Weight: 180 lb (81.647 kg)    Fetal Status: Fetal Heart Rate (bpm): 154 Fundal Height: 36 cm Movement: Present  Presentation: Vertex  General:  Alert, oriented and cooperative. Patient is in no acute distress.  Skin: Skin is warm and dry. No rash noted.   Cardiovascular: Normal heart rate noted  Respiratory: Normal respiratory effort, no problems with respiration noted  Abdomen: Soft, gravid, appropriate for gestational age. Pain/Pressure: Present     Vaginal: Vag. Bleeding: None.    Vag D/C Character: White  Cervix: Exam revealed Dilation: 2 Effacement (%): 0 Station: -3  Extremities: Normal range of motion.  Edema: Trace  Mental Status: Normal mood and affect. Normal behavior. Normal judgment and thought content.   Urinalysis: Urine Protein: Negative Urine Glucose: Negative  Assessment and Plan:  Pregnancy: [id number] at [redacted]w[redacted]d  1. Prenatal care in third trimester  - Culture, beta strep (group b only) - GC/Chlamydia Probe Amp - US OB Follow Up; Future - AMB Referral to Maternal Fetal Medicine (MFM)  2. Fetal echogenic intracardiac focus on prenatal ultrasound  - US OB Follow Up; Future - AMB Referral to Maternal Fetal Medicine (MFM), Genetic  couseling  3. Abnormal quad screen  - US OB Follow Up; Future - AMB Referral to Maternal Fetal Medicine (MFM)  4. Abnormal antenatal AFP screen   Term labor symptoms and general obstetric precautions including but not limited to vaginal bleeding, contractions, leaking of fluid and fetal movement were reviewed in detail with the patient. Please refer to After Visit Summary for other counseling recommendations.  Return in about 1 week (around 10/15/2014). Discussed that isolated finding of LVEIF is not concerning, but should have further investigation in combination w/ increased DSR. Discussed w/ Dr. Macon Large.   Dorathy Kinsman, CNM

## 2014-10-08 NOTE — Progress Notes (Signed)
Breastfeeding tip of the week reviewed. 

## 2014-10-08 NOTE — Patient Instructions (Signed)
Your previous ultrasound showed a left ventricular intracardiac focus. This is thought to be a calcified papillary muscle and does not affect the functioning of the heart.   Braxton Hicks Contractions Contractions of the uterus can occur throughout pregnancy. Contractions are not always a sign that you are in labor.  WHAT ARE BRAXTON HICKS CONTRACTIONS?  Contractions that occur before labor are called Braxton Hicks contractions, or false labor. Toward the end of pregnancy (32-34 weeks), these contractions can develop more often and may become more forceful. This is not true labor because these contractions do not result in opening (dilatation) and thinning of the cervix. They are sometimes difficult to tell apart from true labor because these contractions can be forceful and people have different pain tolerances. You should not feel embarrassed if you go to the hospital with false labor. Sometimes, the only way to tell if you are in true labor is for your health care provider to look for changes in the cervix. If there are no prenatal problems or other health problems associated with the pregnancy, it is completely safe to be sent home with false labor and await the onset of true labor. HOW CAN YOU TELL THE DIFFERENCE BETWEEN TRUE AND FALSE LABOR? False Labor  The contractions of false labor are usually shorter and not as hard as those of true labor.   The contractions are usually irregular.   The contractions are often felt in the front of the lower abdomen and in the groin.   The contractions may go away when you walk around or change positions while lying down.   The contractions get weaker and are shorter lasting as time goes on.   The contractions do not usually become progressively stronger, regular, and closer together as with true labor.  True Labor  Contractions in true labor last 30-70 seconds, become very regular, usually become more intense, and increase in frequency.    The contractions do not go away with walking.   The discomfort is usually felt in the top of the uterus and spreads to the lower abdomen and low back.   True labor can be determined by your health care provider with an exam. This will show that the cervix is dilating and getting thinner.  WHAT TO REMEMBER  Keep up with your usual exercises and follow other instructions given by your health care provider.   Take medicines as directed by your health care provider.   Keep your regular prenatal appointments.   Eat and drink lightly if you think you are going into labor.   If Braxton Hicks contractions are making you uncomfortable:   Change your position from lying down or resting to walking, or from walking to resting.   Sit and rest in a tub of warm water.   Drink 2-3 glasses of water. Dehydration may cause these contractions.   Do slow and deep breathing several times an hour.  WHEN SHOULD I SEEK IMMEDIATE MEDICAL CARE? Seek immediate medical care if:  Your contractions become stronger, more regular, and closer together.   You have fluid leaking or gushing from your vagina.   You have a fever.   You pass blood-tinged mucus.   You have vaginal bleeding.   You have continuous abdominal pain.   You have low back pain that you never had before.   You feel your baby's head pushing down and causing pelvic pressure.   Your baby is not moving as much as it used to.  Document  Released: 02/23/2005 Document Revised: 02/28/2013 Document Reviewed: 12/05/2012 [facility] Patient Information 2015 [place], [place]. This information is not intended to replace advice given to you by your health care provider. Make sure you discuss any questions you have with your health care provider.

## 2014-10-09 LAB — GC/CHLAMYDIA PROBE AMP
CT PROBE, AMP APTIMA: POSITIVE — AB
GC PROBE AMP APTIMA: NEGATIVE

## 2014-10-10 ENCOUNTER — Telehealth: Payer: Self-pay

## 2014-10-10 LAB — CULTURE, BETA STREP (GROUP B ONLY)

## 2014-10-10 MED ORDER — AZITHROMYCIN 250 MG PO TABS: 1000.0000 mg | ORAL_TABLET | Freq: Once | ORAL | Status: DC

## 2014-10-10 NOTE — Telephone Encounter (Addendum)
Called pt and was unable to LM due to VM would not allow message to be sent.  Re:  Pt tested + for chlamydia, tx e-prescribed to [facility] per protocol.  STD Card sent.

## 2014-10-10 NOTE — Telephone Encounter (Signed)
Spoke with patient advised of her test results and to pick up her rx at [facility]. Advised her to make sure patient tells all her sexual partners so that they can be treated.

## 2014-10-13 ENCOUNTER — Encounter: Payer: Self-pay | Admitting: Advanced Practice Midwife

## 2014-10-13 DIAGNOSIS — O98813 Other maternal infectious and parasitic diseases complicating pregnancy, third trimester: Secondary | ICD-10-CM

## 2014-10-13 DIAGNOSIS — A749 Chlamydial infection, unspecified: Secondary | ICD-10-CM | POA: Insufficient documentation

## 2014-10-15 ENCOUNTER — Other Ambulatory Visit: Payer: Self-pay | Admitting: Advanced Practice Midwife

## 2014-10-15 ENCOUNTER — Encounter (HOSPITAL_COMMUNITY): Payer: Self-pay

## 2014-10-15 ENCOUNTER — Telehealth (HOSPITAL_COMMUNITY): Payer: Self-pay | Admitting: *Deleted

## 2014-10-15 ENCOUNTER — Encounter (HOSPITAL_COMMUNITY): Payer: Self-pay | Admitting: Advanced Practice Midwife

## 2014-10-15 ENCOUNTER — Ambulatory Visit (HOSPITAL_COMMUNITY)
Admission: RE | Admit: 2014-10-15 | Discharge: 2014-10-15 | Disposition: A | Discharge status: Home / Self Care | Payer: Medicaid Other | Source: Ambulatory Visit | Attending: Advanced Practice Midwife | Admitting: Advanced Practice Midwife

## 2014-10-15 ENCOUNTER — Ambulatory Visit (INDEPENDENT_AMBULATORY_CARE_PROVIDER_SITE_OTHER): Payer: Self-pay | Admitting: Obstetrics & Gynecology

## 2014-10-15 ENCOUNTER — Ambulatory Visit (HOSPITAL_COMMUNITY): Admission: RE | Admit: 2014-10-15 | Payer: Medicaid Other | Source: Ambulatory Visit

## 2014-10-15 VITALS — BP 123/68 | HR 82 | Wt 178.4 lb

## 2014-10-15 VITALS — BP 136/69 | HR 75 | Temp 98.5°F | Wt 176.6 lb

## 2014-10-15 DIAGNOSIS — O09293 Supervision of pregnancy with other poor reproductive or obstetric history, third trimester: Secondary | ICD-10-CM

## 2014-10-15 DIAGNOSIS — O365930 Maternal care for other known or suspected poor fetal growth, third trimester, not applicable or unspecified: Secondary | ICD-10-CM | POA: Insufficient documentation

## 2014-10-15 DIAGNOSIS — O280 Abnormal hematological finding on antenatal screening of mother: Secondary | ICD-10-CM

## 2014-10-15 DIAGNOSIS — Z3493 Encounter for supervision of normal pregnancy, unspecified, third trimester: Secondary | ICD-10-CM

## 2014-10-15 DIAGNOSIS — O365931 Maternal care for other known or suspected poor fetal growth, third trimester, fetus 1: Secondary | ICD-10-CM

## 2014-10-15 DIAGNOSIS — O284 Abnormal radiological finding on antenatal screening of mother: Secondary | ICD-10-CM | POA: Diagnosis not present

## 2014-10-15 DIAGNOSIS — O283 Abnormal ultrasonic finding on antenatal screening of mother: Secondary | ICD-10-CM | POA: Insufficient documentation

## 2014-10-15 DIAGNOSIS — O0993 Supervision of high risk pregnancy, unspecified, third trimester: Secondary | ICD-10-CM

## 2014-10-15 LAB — POCT URINALYSIS DIP (DEVICE)
BILIRUBIN URINE: NEGATIVE
Glucose, UA: NEGATIVE mg/dL
Hgb urine dipstick: NEGATIVE
KETONES UR: NEGATIVE mg/dL
Nitrite: NEGATIVE
Protein, ur: NEGATIVE mg/dL
Specific Gravity, Urine: 1.015 (ref 1.005–1.030)
Urobilinogen, UA: 0.2 mg/dL (ref 0.0–1.0)
pH: 7 (ref 5.0–8.0)

## 2014-10-15 LAB — US OB FOLLOW UP

## 2014-10-15 NOTE — Telephone Encounter (Signed)
Preadmission screen  

## 2014-10-15 NOTE — Patient Instructions (Signed)
Labor Induction  Labor induction is when steps are taken to cause a pregnant woman to begin the labor process. Most women go into labor on their own between 37 weeks and 42 weeks of the pregnancy. When this does not happen or when there is a medical need, methods may be used to induce labor. Labor induction causes a pregnant woman's uterus to contract. It also causes the cervix to soften (ripen), open (dilate), and thin out (efface). Usually, labor is not induced before 39 weeks of the pregnancy unless there is a problem with the baby or mother.  Before inducing labor, your health care provider will consider a number of factors, including the following:  The medical condition of you and the baby.   How many weeks along you are.   The status of the baby's lung maturity.   The condition of the cervix.   The position of the baby.  WHAT ARE THE REASONS FOR LABOR INDUCTION? Labor may be induced for the following reasons:  The health of the baby or mother is at risk.   The pregnancy is overdue by 1 week or more.   The water breaks but labor does not start on its own.   The mother has a health condition or serious illness, such as high blood pressure, infection, placental abruption, or diabetes.  The amniotic fluid amounts are low around the baby.   The baby is distressed.  Convenience or wanting the baby to be born on a certain date is not a reason for inducing labor. WHAT METHODS ARE USED FOR LABOR INDUCTION? Several methods of labor induction may be used, such as:   Prostaglandin medicine. This medicine causes the cervix to dilate and ripen. The medicine will also start contractions. It can be taken by mouth or by inserting a suppository into the vagina.   Inserting a thin tube (catheter) with a balloon on the end into the vagina to dilate the cervix. Once inserted, the balloon is expanded with water, which causes the cervix to open.   Stripping the membranes. Your health  care provider separates amniotic sac tissue from the cervix, causing the cervix to be stretched and causing the release of a hormone called progesterone. This may cause the uterus to contract. It is often done during an office visit. You will be sent home to wait for the contractions to begin. You will then come in for an induction.   Breaking the water. Your health care provider makes a hole in the amniotic sac using a small instrument. Once the amniotic sac breaks, contractions should begin. This may still take hours to see an effect.   Medicine to trigger or strengthen contractions. This medicine is given through an IV access tube inserted into a vein in your arm.  All of the methods of induction, besides stripping the membranes, will be done in the hospital. Induction is done in the hospital so that you and the baby can be carefully monitored.  HOW LONG DOES IT TAKE FOR LABOR TO BE INDUCED? Some inductions can take up to 2-3 days. Depending on the cervix, it usually takes less time. It takes longer when you are induced early in the pregnancy or if this is your first pregnancy. If a mother is still pregnant and the induction has been going on for 2-3 days, either the mother will be sent home or a cesarean delivery will be needed. WHAT ARE THE RISKS ASSOCIATED WITH LABOR INDUCTION? Some of the risks of induction   include:   Changes in fetal heart rate, such as too high, too low, or erratic.   Fetal distress.   Chance of infection for the mother and baby.   Increased chance of having a cesarean delivery.   Breaking off (abruption) of the placenta from the uterus (rare).   Uterine rupture (very rare).  When induction is needed for medical reasons, the benefits of induction may outweigh the risks. WHAT ARE SOME REASONS FOR NOT INDUCING LABOR? Labor induction should not be done if:   It is shown that your baby does not tolerate labor.   You have had previous surgeries on your  uterus, such as a myomectomy or the removal of fibroids.   Your placenta lies very low in the uterus and blocks the opening of the cervix (placenta previa).   Your baby is not in a head-down position.   The umbilical cord drops down into the birth canal in front of the baby. This could cut off the baby's blood and oxygen supply.   You have had a previous cesarean delivery.   There are unusual circumstances, such as the baby being extremely premature.  Document Released: 07/15/2006 Document Revised: 10/26/2012 Document Reviewed: 09/22/2012 [employer] Patient Information 2015 [employer]. This information is not intended to replace advice given to you by your health care provider. Make sure you discuss any questions you have with your health care provider.  

## 2014-10-15 NOTE — Progress Notes (Signed)
[facility] ultrasound  Indication: 24 yr old [id number] at [redacted]w[redacted]d with abnormal quad screen with risk of trisomy 2 of 1:90 and elevated inhibin of 2.3MoM and history of 19 week PPROM in previous pregnancy for fetal growth, BPP, and Doppler studies.  Findings: 1. Single intrauterine pregnancy. 2. Estimated fetal weight is in the <10th%. 3. Posterior placenta without evidence of previa. 4. Normal amniotic fluid index. 5. The limited anatomy survey is normal. 6. Normal umbilical artery Doppler studies. 7. Normal biophysical profile of 8/8.  Recommendations: 1. Fetal growth restriction: - counseled may be constitutional or placental insufficiency - discussed increased risk of need for early delivery, fetal intolerance of labor, and stillbirth - normal AFI and Doppler studies - recommend fetal kick counts - if growth restriction remains uncomplicated- normal AFI, reassuring testing recommend delivery at 38-39 weeks or sooner if clinically indicated - if not delivered in 1 week recommend repeat BPP, AFI, and Doppler studies 2. Abnormal quad screen/elevated inhibin: - counseled by primary OB - declined further screening - monitor for signs/symptoms of preeclampsia  Eulis Foster, MD

## 2014-10-15 NOTE — Progress Notes (Signed)
IOL scheduled for 10/18/2014 :30 AM

## 2014-10-15 NOTE — Progress Notes (Signed)
US showed <10 5ile EFW, rec IOL 38-39 weeks  Subjective:  Ana [name] is a 24 y.o. [id number] at [redacted]w[redacted]d being seen today for ongoing prenatal care.  Patient reports no complaints.  Contractions: Not present.  Vag. Bleeding: None. Movement: Present. Denies leaking of fluid.   The following portions of the patient's history were reviewed and updated as appropriate: allergies, current medications, past family history, past medical history, past social history, past surgical history and problem list.   Objective:   Filed Vitals:   10/15/14 1116  BP: 136/69  Pulse: 75  Temp: 98.5 F (36.9 C)  Weight: 176 lb 9.6 oz (80.105 kg)    Fetal Status: Fetal Heart Rate (bpm): 144   Movement: Present     General:  Alert, oriented and cooperative. Patient is in no acute distress.  Skin: Skin is warm and dry. No rash noted.   Cardiovascular: Normal heart rate noted  Respiratory: Normal respiratory effort, no problems with respiration noted  Abdomen: Soft, gravid, appropriate for gestational age. Pain/Pressure: Absent     Pelvic: Vag. Bleeding: None Vag D/C Character: White   Cervical exam deferred        Extremities: Normal range of motion.     Mental Status: Normal mood and affect. Normal behavior. Normal judgment and thought content.   Urinalysis:      Assessment and Plan:  Pregnancy: [id number] at [redacted]w[redacted]d  1. Supervision of high risk pregnancy, antepartum, third trimester abnl AFI, IUGR  2. History of preterm PROM in previous pregnancy, currently pregnant, third trimester   3. Intrauterine growth restriction affecting antepartum care of mother in third trimester, not applicable or unspecified fetus IOL 38 weeks  Term labor symptoms and general obstetric precautions including but not limited to vaginal bleeding, contractions, leaking of fluid and fetal movement were reviewed in detail with the patient. Please refer to After Visit Summary for other counseling recommendations.  Return in about 6  weeks (around 11/26/2014).   Adam Phenix, MD

## 2014-10-18 ENCOUNTER — Inpatient Hospital Stay (HOSPITAL_COMMUNITY): Payer: Medicaid Other | Admitting: Anesthesiology

## 2014-10-18 ENCOUNTER — Encounter (HOSPITAL_COMMUNITY): Payer: Self-pay

## 2014-10-18 ENCOUNTER — Inpatient Hospital Stay (HOSPITAL_COMMUNITY)
Admission: RE | Admit: 2014-10-18 | Discharge: 2014-10-20 | DRG: 775 | Disposition: A | Discharge status: Home / Self Care | Payer: Medicaid Other | Source: Ambulatory Visit | Attending: Obstetrics & Gynecology | Admitting: Obstetrics & Gynecology

## 2014-10-18 VITALS — BP 112/84 | HR 61 | Temp 98.0°F | Resp 18 | Ht 64.0 in | Wt 176.0 lb

## 2014-10-18 DIAGNOSIS — O99214 Obesity complicating childbirth: Secondary | ICD-10-CM | POA: Diagnosis present

## 2014-10-18 DIAGNOSIS — O280 Abnormal hematological finding on antenatal screening of mother: Secondary | ICD-10-CM

## 2014-10-18 DIAGNOSIS — Z6830 Body mass index (BMI) 30.0-30.9, adult: Secondary | ICD-10-CM | POA: Diagnosis not present

## 2014-10-18 DIAGNOSIS — O2343 Unspecified infection of urinary tract in pregnancy, third trimester: Secondary | ICD-10-CM

## 2014-10-18 DIAGNOSIS — Z87891 Personal history of nicotine dependence: Secondary | ICD-10-CM

## 2014-10-18 DIAGNOSIS — Z8249 Family history of ischemic heart disease and other diseases of the circulatory system: Secondary | ICD-10-CM | POA: Diagnosis not present

## 2014-10-18 DIAGNOSIS — O365930 Maternal care for other known or suspected poor fetal growth, third trimester, not applicable or unspecified: Secondary | ICD-10-CM

## 2014-10-18 DIAGNOSIS — O09293 Supervision of pregnancy with other poor reproductive or obstetric history, third trimester: Secondary | ICD-10-CM

## 2014-10-18 DIAGNOSIS — Z3A38 38 weeks gestation of pregnancy: Secondary | ICD-10-CM | POA: Diagnosis present

## 2014-10-18 DIAGNOSIS — O99330 Smoking (tobacco) complicating pregnancy, unspecified trimester: Secondary | ICD-10-CM

## 2014-10-18 DIAGNOSIS — O9832 Other infections with a predominantly sexual mode of transmission complicating childbirth: Secondary | ICD-10-CM

## 2014-10-18 DIAGNOSIS — O0993 Supervision of high risk pregnancy, unspecified, third trimester: Secondary | ICD-10-CM

## 2014-10-18 DIAGNOSIS — A568 Sexually transmitted chlamydial infection of other sites: Secondary | ICD-10-CM

## 2014-10-18 DIAGNOSIS — E669 Obesity, unspecified: Secondary | ICD-10-CM | POA: Diagnosis present

## 2014-10-18 LAB — CBC
HEMATOCRIT: 31.2 % — AB (ref 36.0–46.0)
HEMOGLOBIN: 10.3 g/dL — AB (ref 12.0–15.0)
MCH: 28.9 pg (ref 26.0–34.0)
MCHC: 33 g/dL (ref 30.0–36.0)
MCV: 87.6 fL (ref 78.0–100.0)
Platelets: 222 10*3/uL (ref 150–400)
RBC: 3.56 MIL/uL — AB (ref 3.87–5.11)
RDW: 13.3 % (ref 11.5–15.5)
WBC: 9.6 10*3/uL (ref 4.0–10.5)

## 2014-10-18 LAB — TYPE AND SCREEN
ABO/RH(D): O POS
Antibody Screen: NEGATIVE

## 2014-10-18 MED ORDER — IBUPROFEN 600 MG PO TABS
600.0000 mg | ORAL_TABLET | Freq: Four times a day (QID) | ORAL | Status: DC
  Administered 2014-10-18 – 2014-10-20 (×6): 600 mg via ORAL
  Filled 2014-10-18 (×6): qty 1

## 2014-10-18 MED ORDER — FENTANYL 2.5 MCG/ML BUPIVACAINE 1/10 % EPIDURAL INFUSION (WH - ANES)
14.0000 mL/h | INTRAMUSCULAR | Status: DC | PRN
  Administered 2014-10-18 (×2): 14 mL/h via EPIDURAL
  Filled 2014-10-18: qty 125

## 2014-10-18 MED ORDER — TETANUS-DIPHTH-ACELL PERTUSSIS 5-2.5-18.5 LF-MCG/0.5 IM SUSP: 0.5000 mL | Freq: Once | INTRAMUSCULAR | Status: DC

## 2014-10-18 MED ORDER — DIBUCAINE 1 % RE OINT: 1.0000 "application " | TOPICAL_OINTMENT | RECTAL | Status: DC | PRN

## 2014-10-18 MED ORDER — CITRIC ACID-SODIUM CITRATE 334-500 MG/5ML PO SOLN: 30.0000 mL | ORAL | Status: DC | PRN

## 2014-10-18 MED ORDER — ONDANSETRON HCL 4 MG PO TABS
4.0000 mg | ORAL_TABLET | ORAL | Status: DC | PRN
  Administered 2014-10-19 – 2014-10-20 (×3): 4 mg via ORAL
  Filled 2014-10-18 (×3): qty 1

## 2014-10-18 MED ORDER — EPHEDRINE 5 MG/ML INJ
10.0000 mg | INTRAVENOUS | Status: DC | PRN
  Filled 2014-10-18: qty 2

## 2014-10-18 MED ORDER — FLEET ENEMA 7-19 GM/118ML RE ENEM: 1.0000 | ENEMA | RECTAL | Status: DC | PRN

## 2014-10-18 MED ORDER — LANOLIN HYDROUS EX OINT: TOPICAL_OINTMENT | CUTANEOUS | Status: DC | PRN

## 2014-10-18 MED ORDER — LIDOCAINE HCL (PF) 1 % IJ SOLN
INTRAMUSCULAR | Status: DC | PRN
  Administered 2014-10-18 (×2): 4 mL via EPIDURAL

## 2014-10-18 MED ORDER — OXYCODONE-ACETAMINOPHEN 5-325 MG PO TABS
2.0000 | ORAL_TABLET | ORAL | Status: DC | PRN
  Administered 2014-10-19 – 2014-10-20 (×8): 2 via ORAL
  Filled 2014-10-18 (×8): qty 2

## 2014-10-18 MED ORDER — ONDANSETRON HCL 4 MG/2ML IJ SOLN: 4.0000 mg | Freq: Four times a day (QID) | INTRAMUSCULAR | Status: DC | PRN

## 2014-10-18 MED ORDER — ONDANSETRON HCL 4 MG/2ML IJ SOLN: 4.0000 mg | INTRAMUSCULAR | Status: DC | PRN

## 2014-10-18 MED ORDER — WITCH HAZEL-GLYCERIN EX PADS: 1.0000 "application " | MEDICATED_PAD | CUTANEOUS | Status: DC | PRN

## 2014-10-18 MED ORDER — DIPHENHYDRAMINE HCL 50 MG/ML IJ SOLN: 12.5000 mg | INTRAMUSCULAR | Status: DC | PRN

## 2014-10-18 MED ORDER — FENTANYL 2.5 MCG/ML BUPIVACAINE 1/10 % EPIDURAL INFUSION (WH - ANES): 14.0000 mL/h | INTRAMUSCULAR | Status: DC | PRN

## 2014-10-18 MED ORDER — OXYTOCIN 40 UNITS IN LACTATED RINGERS INFUSION - SIMPLE MED
62.5000 mL/h | INTRAVENOUS | Status: DC
  Administered 2014-10-18: 62.5 mL/h via INTRAVENOUS
  Filled 2014-10-18: qty 1000

## 2014-10-18 MED ORDER — PRENATAL MULTIVITAMIN CH
1.0000 | ORAL_TABLET | Freq: Every day | ORAL | Status: DC
  Administered 2014-10-19: 1 via ORAL
  Filled 2014-10-18: qty 1

## 2014-10-18 MED ORDER — ZOLPIDEM TARTRATE 5 MG PO TABS: 5.0000 mg | ORAL_TABLET | Freq: Every evening | ORAL | Status: DC | PRN

## 2014-10-18 MED ORDER — OXYCODONE-ACETAMINOPHEN 5-325 MG PO TABS
1.0000 | ORAL_TABLET | ORAL | Status: DC | PRN
  Administered 2014-10-18: 1 via ORAL
  Filled 2014-10-18: qty 1

## 2014-10-18 MED ORDER — SIMETHICONE 80 MG PO CHEW: 80.0000 mg | CHEWABLE_TABLET | ORAL | Status: DC | PRN

## 2014-10-18 MED ORDER — PHENYLEPHRINE 40 MCG/ML (10ML) SYRINGE FOR IV PUSH (FOR BLOOD PRESSURE SUPPORT)
80.0000 ug | PREFILLED_SYRINGE | INTRAVENOUS | Status: DC | PRN
  Filled 2014-10-18: qty 20
  Filled 2014-10-18: qty 2

## 2014-10-18 MED ORDER — LACTATED RINGERS IV SOLN: 500.0000 mL | INTRAVENOUS | Status: DC | PRN

## 2014-10-18 MED ORDER — TERBUTALINE SULFATE 1 MG/ML IJ SOLN
0.2500 mg | Freq: Once | INTRAMUSCULAR | Status: DC | PRN
  Filled 2014-10-18: qty 1

## 2014-10-18 MED ORDER — MISOPROSTOL 200 MCG PO TABS
50.0000 ug | ORAL_TABLET | ORAL | Status: DC
  Administered 2014-10-18: 50 ug via ORAL
  Filled 2014-10-18: qty 0.5

## 2014-10-18 MED ORDER — ACETAMINOPHEN 325 MG PO TABS: 650.0000 mg | ORAL_TABLET | ORAL | Status: DC | PRN

## 2014-10-18 MED ORDER — LIDOCAINE HCL (PF) 1 % IJ SOLN
30.0000 mL | INTRAMUSCULAR | Status: DC | PRN
  Filled 2014-10-18: qty 30

## 2014-10-18 MED ORDER — PHENYLEPHRINE 40 MCG/ML (10ML) SYRINGE FOR IV PUSH (FOR BLOOD PRESSURE SUPPORT): 80.0000 ug | PREFILLED_SYRINGE | INTRAVENOUS | Status: DC | PRN

## 2014-10-18 MED ORDER — OXYTOCIN BOLUS FROM INFUSION
500.0000 mL | INTRAVENOUS | Status: DC
  Administered 2014-10-18: 500 mL via INTRAVENOUS

## 2014-10-18 MED ORDER — LACTATED RINGERS IV SOLN
INTRAVENOUS | Status: DC
  Administered 2014-10-18 (×2): via INTRAVENOUS

## 2014-10-18 MED ORDER — OXYCODONE-ACETAMINOPHEN 5-325 MG PO TABS: 2.0000 | ORAL_TABLET | ORAL | Status: DC | PRN

## 2014-10-18 MED ORDER — OXYCODONE-ACETAMINOPHEN 5-325 MG PO TABS: 1.0000 | ORAL_TABLET | ORAL | Status: DC | PRN

## 2014-10-18 MED ORDER — SENNOSIDES-DOCUSATE SODIUM 8.6-50 MG PO TABS
2.0000 | ORAL_TABLET | ORAL | Status: DC
  Administered 2014-10-18 – 2014-10-19 (×2): 2 via ORAL
  Filled 2014-10-18 (×2): qty 2

## 2014-10-18 MED ORDER — DIPHENHYDRAMINE HCL 25 MG PO CAPS: 25.0000 mg | ORAL_CAPSULE | Freq: Four times a day (QID) | ORAL | Status: DC | PRN

## 2014-10-18 MED ORDER — BENZOCAINE-MENTHOL 20-0.5 % EX AERO: 1.0000 "application " | INHALATION_SPRAY | CUTANEOUS | Status: DC | PRN

## 2014-10-18 NOTE — Anesthesia Preprocedure Evaluation (Signed)
Anesthesia Evaluation  Patient identified by MRN, date of birth, ID band Patient awake    Reviewed: Allergy & Precautions, H&P , Patient's Chart, lab work & pertinent test results  Airway Mallampati: III  TM Distance: >3 FB Neck ROM: full    Dental no notable dental hx. (+) Teeth Intact   Pulmonary asthma , former smoker,  breath sounds clear to auscultation  Pulmonary exam normal       Cardiovascular negative cardio ROS  Rhythm:regular Rate:Normal     Neuro/Psych negative neurological ROS  negative psych ROS   GI/Hepatic negative GI ROS, Neg liver ROS,   Endo/Other  Obesity  Renal/GU Renal diseaseKidney infections  negative genitourinary   Musculoskeletal   Abdominal   Peds  Hematology  (+) anemia ,   Anesthesia Other Findings   Reproductive/Obstetrics (+) Pregnancy                             Anesthesia Physical Anesthesia Plan  ASA: II  Anesthesia Plan: Epidural   Post-op Pain Management:    Induction:   Airway Management Planned:   Additional Equipment:   Intra-op Plan:   Post-operative Plan:   Informed Consent: I have reviewed the patients History and Physical, chart, labs and discussed the procedure including the risks, benefits and alternatives for the proposed anesthesia with the patient or authorized representative who has indicated his/her understanding and acceptance.     Plan Discussed with: Anesthesiologist  Anesthesia Plan Comments:         Anesthesia Quick Evaluation

## 2014-10-18 NOTE — Anesthesia Procedure Notes (Signed)
Epidural Patient location during procedure: OB Start time: 10/18/2014 11:53 AM  Staffing Anesthesiologist: Mal Amabile  Preanesthetic Checklist Completed: patient identified, site marked, surgical consent, pre-op evaluation, timeout performed, IV checked, risks and benefits discussed and monitors and equipment checked  Epidural Patient position: sitting Prep: site prepped and draped and DuraPrep Patient monitoring: continuous pulse ox and blood pressure Approach: midline Location: L3-L4 Injection technique: LOR air  Needle:  Needle type: Tuohy  Needle gauge: 17 G Needle length: 9 cm and 9 Needle insertion depth: 5 cm cm Catheter type: closed end flexible Catheter size: 19 Gauge Catheter at skin depth: 10 cm Test dose: negative and Other  Assessment Events: blood not aspirated, injection not painful, no injection resistance, negative IV test and no paresthesia  Additional Notes Patient identified. Risks and benefits discussed including failed block, incomplete  Pain control, post dural puncture headache, nerve damage, paralysis, blood pressure Changes, nausea, vomiting, reactions to medications-both toxic and allergic and post Partum back pain. All questions were answered. Patient expressed understanding and wished to proceed. Sterile technique was used throughout procedure. Epidural site was Dressed with sterile barrier dressing. No paresthesias, signs of intravascular injection Or signs of intrathecal spread were encountered.  Patient was more comfortable after the epidural was dosed. Please see RN's note for documentation of vital signs and FHR which are stable.

## 2014-10-18 NOTE — H&P (Signed)
Ana [name] is a 24 y.o. female [phone] @ 38.0wks presenting for IOL due to IUGR <10%. Her preg has been followed by the [facility] and has been remarkable for 1) IUGR <10% w/ nl AFI and dopplers 2) inc risk DSR, declined NIPS 3) isolated LVEIF 4) previous previable delivery @ 19wks- ?PPROM 5) previous smoker 6) chlamydia + @ 36wks- tx on 8/3. History OB History    Gravida Para Term Preterm AB TAB SAB Ectopic Multiple Living   0 1 0 1 0 0 4     Past Medical History  Diagnosis Date  . Chlamydia Jan 2014  . Abnormal Pap smear   . Asthma   . Kidney infection    Past Surgical History  Procedure Laterality Date  . Wisdom tooth extraction     Family History: family history includes Hypertension in her maternal grandmother and mother. Social History:  reports that she quit smoking about 5 months ago. She has never used smokeless tobacco. She reports that she does not drink alcohol or use illicit drugs.   Prenatal Transfer Tool  Maternal Diabetes: No Genetic Screening: Abnormal:  Results: Elevated risk of Trisomy 21; NIPS declined Maternal Ultrasounds/Referrals: Abnormal:  Findings:   Isolated EIF (echogenic intracardiac focus)- left Fetal Ultrasounds or other Referrals:  None genetics declined Maternal Substance Abuse:  No Significant Maternal Medications:  None Significant Maternal Lab Results:  Lab values include: Other: tx for chlam 8/3 Other Comments:  None  ROS  Blood pressure 113/61, pulse 75, temperature 98 F (36.7 C), temperature source Oral, resp. rate 18, height  (1.626 m), weight 79.833 kg (176 lb), last menstrual period 01/25/2014, SpO2 100 %. Exam Physical Exam  Constitutional: She is oriented to person, place, and time. She appears well-developed.  HENT:  Head: Normocephalic.  Neck: Normal range of motion.  Cardiovascular: Normal rate.   Respiratory: Effort normal.  GI:  EFM +accels, no decels Rare ctx  Genitourinary: Vagina normal.  Musculoskeletal:  Normal range of motion.  Neurological: She is alert and oriented to person, place, and time.  Skin: Skin is warm and dry.  Psychiatric: She has a normal mood and affect. Her behavior is normal. Thought content normal.   Cx 1+/50/-2   Prenatal labs: ABO, Rh: --/--/O POS (08/11 0955) Antibody: NEG (08/11 0955) Rubella: 3.82 (03/08 1547) RPR: NON REAC (06/09 1322)  HBsAg: NEGATIVE (03/08 1547)  HIV: NONREACTIVE (03/08 1547)  GBS: Negative (08/01 0000)   Assessment/Plan: IUP@38 .0wks IUGR Cx unfavorable  Admit to [facility] Planned cytotec for cx ripening followed by Pitocin/AROM  Anticipate SVD Communicate w/ nursery re recent tx of chlam on 8/3  Cam Hai CNM 10/18/2014, 2:14 PM

## 2014-10-19 LAB — RPR: RPR Ser Ql: NONREACTIVE

## 2014-10-19 NOTE — Progress Notes (Signed)
Post Partum Day 1 Subjective:  Ana [name] is a 24 y.o. B2W4132 [redacted]w[redacted]d s/p SVD for IOL 2/2 IUGR <10% .  No acute events overnight.  Pt denies problems with ambulating, voiding or po intake.  She denies nausea or vomiting.  Pain is well controlled.  She has had flatus. She has had bowel movement.  Lochia Small.  Plan for birth control is Depo-Provera.  Method of Feeding: Bottle  Objective: Blood pressure 138/74, pulse 59, temperature 97.9 F (36.6 C), temperature source Oral, resp. rate 18, height  (1.626 m), weight 79.833 kg (176 lb), last menstrual period 01/25/2014, SpO2 100 %, unknown if currently breastfeeding.  Physical Exam:  General: alert, cooperative and no distress Lochia:normal flow Chest: CTAB Heart: RRR no m/r/g Abdomen: +BS, soft, nontender,  Uterine Fundus: firm, below umbilicus DVT Evaluation: No evidence of DVT seen on physical exam. Extremities: 1+ edema   Recent Labs  10/18/14 0955  HGB 10.3*  HCT 31.2*    Assessment/Plan:  ASSESSMENT: Ana [name] is a 24 y.o. G4W1027 [redacted]w[redacted]d s/p SVD s/p IOL 2/2 IUGR <10%.  Plan for discharge tomorrow and Contraception Depo   LOS: 1 day   De Hollingshead 10/19/2014, 7:47 AM

## 2014-10-19 NOTE — Anesthesia Postprocedure Evaluation (Signed)
  Anesthesia Post-op Note  Patient: Ana [name]  Procedure(s) Performed: * No procedures listed *  Patient Location: PACU and Mother/Baby  Anesthesia Type:Epidural  Level of Consciousness: awake, alert  and oriented  Airway and Oxygen Therapy: Patient Spontanous Breathing  Post-op Pain: none  Post-op Assessment: Post-op Vital signs reviewed, Patient's Cardiovascular Status Stable, No headache, No backache, No residual numbness and No residual motor weakness  Post-op Vital Signs: Reviewed and stable  Complications: No apparent anesthesia complications

## 2014-10-20 MED ORDER — OXYCODONE-ACETAMINOPHEN 5-325 MG PO TABS: 1.0000 | ORAL_TABLET | Freq: Four times a day (QID) | ORAL | Status: DC | PRN

## 2014-10-20 MED ORDER — IBUPROFEN 600 MG PO TABS: 600.0000 mg | ORAL_TABLET | Freq: Four times a day (QID) | ORAL | Status: DC | PRN

## 2014-10-20 NOTE — Discharge Summary (Signed)
Obstetric Discharge Summary Reason for Admission: IOL for IUGR Prenatal Procedures: NST and ultrasound Intrapartum Procedures: spontaneous vaginal delivery Postpartum Procedures: none Complications-Operative and Postpartum: none Eating, drinking, voiding, ambulating well.  +flatus.  Lochia and pain wnl.  Denies dizziness, lightheadedness, or sob. No complaints.   Hospital Course: Ana [name] is a 23 y.o. [id number] female admited at 38.0wks for IOL d/t IUGR <10%. She received cytotec and progressed to uncomplicated SVB. Her pp course has been uncomplicated.  By PPD#2 she is doing well and is deemed to have received the full benefit of her hospital stay.  Filed Vitals:   10/20/14 0539  BP: 112/84  Pulse: 61  Temp: 98 F (36.7 C)  Resp: 18   H/H: Lab Results  Component Value Date/Time   HGB 10.3* 10/18/2014 09:55 AM   HCT 31.2* 10/18/2014 09:55 AM    Physical Exam: General: alert, cooperative and no distress Abdomen/Uterine Fundus: Appropriately tender, non-distended, FF @ U-2 Incision: n/a Lochia: appropriate Extremities: No evidence of DVT seen on physical exam. Negative Homan's sign, no cords, calf tenderness, or significant calf/ankle edema   Discharge Diagnoses: Term Pregnancy-delivered  Discharge Information: Date: 09/18/2010 Activity: pelvic rest Diet: routine  Medications: PNV and Ibuprofen Breast feeding: No: bottle Contraception: depo Circumcision: outpatient Condition: stable Instructions: refer to handout Discharge to: home  Infant: Home with mother  Follow-up Information    Follow up with [facility] On 11/22/2014.   Specialty:  Obstetrics and Gynecology   Why:  as scheduled for your postpartum visit   Contact information:   [address] [place] [address] [phone]      Marge Duncans, CNM, WHNP-BC 10/20/2014,9:37 AM

## 2014-10-20 NOTE — Discharge Instructions (Signed)

## 2014-11-22 ENCOUNTER — Ambulatory Visit: Payer: Self-pay | Admitting: Medical

## 2014-12-19 ENCOUNTER — Emergency Department (HOSPITAL_COMMUNITY): Payer: Medicaid Other

## 2014-12-19 ENCOUNTER — Emergency Department (HOSPITAL_COMMUNITY)
Admission: EM | Admit: 2014-12-19 | Discharge: 2014-12-19 | Disposition: A | Discharge status: Home / Self Care | Payer: Medicaid Other | Attending: Emergency Medicine | Admitting: Emergency Medicine

## 2014-12-19 ENCOUNTER — Encounter (HOSPITAL_COMMUNITY): Payer: Self-pay

## 2014-12-19 DIAGNOSIS — Z87440 Personal history of urinary (tract) infections: Secondary | ICD-10-CM | POA: Diagnosis not present

## 2014-12-19 DIAGNOSIS — N898 Other specified noninflammatory disorders of vagina: Secondary | ICD-10-CM | POA: Diagnosis not present

## 2014-12-19 DIAGNOSIS — Z3202 Encounter for pregnancy test, result negative: Secondary | ICD-10-CM | POA: Diagnosis not present

## 2014-12-19 DIAGNOSIS — J45909 Unspecified asthma, uncomplicated: Secondary | ICD-10-CM | POA: Diagnosis not present

## 2014-12-19 DIAGNOSIS — R1084 Generalized abdominal pain: Secondary | ICD-10-CM | POA: Diagnosis present

## 2014-12-19 DIAGNOSIS — Z87891 Personal history of nicotine dependence: Secondary | ICD-10-CM | POA: Diagnosis not present

## 2014-12-19 DIAGNOSIS — Z8619 Personal history of other infectious and parasitic diseases: Secondary | ICD-10-CM | POA: Diagnosis not present

## 2014-12-19 DIAGNOSIS — N10 Acute tubulo-interstitial nephritis: Secondary | ICD-10-CM | POA: Insufficient documentation

## 2014-12-19 DIAGNOSIS — N12 Tubulo-interstitial nephritis, not specified as acute or chronic: Secondary | ICD-10-CM

## 2014-12-19 LAB — URINE MICROSCOPIC-ADD ON

## 2014-12-19 LAB — CBC WITH DIFFERENTIAL/PLATELET
Basophils Absolute: 0 10*3/uL (ref 0.0–0.1)
Basophils Relative: 0 %
EOS ABS: 0 10*3/uL (ref 0.0–0.7)
Eosinophils Relative: 0 %
HEMATOCRIT: 35 % — AB (ref 36.0–46.0)
HEMOGLOBIN: 11.3 g/dL — AB (ref 12.0–15.0)
LYMPHS ABS: 0.7 10*3/uL (ref 0.7–4.0)
Lymphocytes Relative: 5 %
MCH: 27.2 pg (ref 26.0–34.0)
MCHC: 32.3 g/dL (ref 30.0–36.0)
MCV: 84.1 fL (ref 78.0–100.0)
MONOS PCT: 4 %
Monocytes Absolute: 0.5 10*3/uL (ref 0.1–1.0)
NEUTROS ABS: 12.8 10*3/uL — AB (ref 1.7–7.7)
NEUTROS PCT: 91 %
Platelets: 203 10*3/uL (ref 150–400)
RBC: 4.16 MIL/uL (ref 3.87–5.11)
RDW: 13.3 % (ref 11.5–15.5)
WBC: 14 10*3/uL — ABNORMAL HIGH (ref 4.0–10.5)

## 2014-12-19 LAB — URINALYSIS, ROUTINE W REFLEX MICROSCOPIC
GLUCOSE, UA: NEGATIVE mg/dL
Hgb urine dipstick: NEGATIVE
Ketones, ur: 15 mg/dL — AB
Nitrite: NEGATIVE
PH: 7 (ref 5.0–8.0)
PROTEIN: NEGATIVE mg/dL
Specific Gravity, Urine: 1.02 (ref 1.005–1.030)
Urobilinogen, UA: 1 mg/dL (ref 0.0–1.0)

## 2014-12-19 LAB — COMPREHENSIVE METABOLIC PANEL
ALK PHOS: 59 U/L (ref 38–126)
ALT: 30 U/L (ref 14–54)
ANION GAP: 9 (ref 5–15)
AST: 23 U/L (ref 15–41)
Albumin: 3.7 g/dL (ref 3.5–5.0)
BILIRUBIN TOTAL: 0.8 mg/dL (ref 0.3–1.2)
BUN: 9 mg/dL (ref 6–20)
CALCIUM: 9.2 mg/dL (ref 8.9–10.3)
CO2: 24 mmol/L (ref 22–32)
Chloride: 98 mmol/L — ABNORMAL LOW (ref 101–111)
Creatinine, Ser: 0.96 mg/dL (ref 0.44–1.00)
GFR calc non Af Amer: >60 mL/min (ref >60)
Glucose, Bld: 96 mg/dL (ref 65–99)
Potassium: 3.8 mmol/L (ref 3.5–5.1)
Sodium: 131 mmol/L — ABNORMAL LOW (ref 135–145)
TOTAL PROTEIN: 6.7 g/dL (ref 6.5–8.1)

## 2014-12-19 LAB — LIPASE, BLOOD: Lipase: 18 U/L — ABNORMAL LOW (ref 22–51)

## 2014-12-19 LAB — WET PREP, GENITAL
TRICH WET PREP: NONE SEEN
YEAST WET PREP: NONE SEEN

## 2014-12-19 LAB — POC URINE PREG, ED: PREG TEST UR: NEGATIVE

## 2014-12-19 MED ORDER — HYDROCODONE-ACETAMINOPHEN 5-325 MG PO TABS: 1.0000 | ORAL_TABLET | ORAL | Status: DC | PRN

## 2014-12-19 MED ORDER — MORPHINE SULFATE (PF) 4 MG/ML IV SOLN
4.0000 mg | Freq: Once | INTRAVENOUS | Status: AC
  Administered 2014-12-19: 4 mg via INTRAVENOUS
  Filled 2014-12-19: qty 1

## 2014-12-19 MED ORDER — OXYCODONE-ACETAMINOPHEN 5-325 MG PO TABS
1.0000 | ORAL_TABLET | Freq: Once | ORAL | Status: AC
  Administered 2014-12-19: 1 via ORAL
  Filled 2014-12-19: qty 1

## 2014-12-19 MED ORDER — SODIUM CHLORIDE 0.9 % IV BOLUS (SEPSIS)
1000.0000 mL | Freq: Once | INTRAVENOUS | Status: AC
  Administered 2014-12-19: 1000 mL via INTRAVENOUS

## 2014-12-19 MED ORDER — ONDANSETRON HCL 4 MG PO TABS: 4.0000 mg | ORAL_TABLET | Freq: Four times a day (QID) | ORAL | Status: DC

## 2014-12-19 MED ORDER — CIPROFLOXACIN HCL 500 MG PO TABS: 500.0000 mg | ORAL_TABLET | Freq: Two times a day (BID) | ORAL | Status: DC

## 2014-12-19 MED ORDER — DEXTROSE 5 % IV SOLN
1.0000 g | Freq: Once | INTRAVENOUS | Status: AC
  Administered 2014-12-19: 1 g via INTRAVENOUS
  Filled 2014-12-19: qty 10

## 2014-12-19 MED ORDER — ONDANSETRON HCL 4 MG/2ML IJ SOLN
4.0000 mg | Freq: Once | INTRAMUSCULAR | Status: AC
  Administered 2014-12-19: 4 mg via INTRAVENOUS
  Filled 2014-12-19: qty 2

## 2014-12-19 MED ORDER — IOHEXOL 300 MG/ML  SOLN
100.0000 mL | Freq: Once | INTRAMUSCULAR | Status: AC | PRN
  Administered 2014-12-19: 100 mL via INTRAVENOUS

## 2014-12-19 NOTE — ED Notes (Signed)
Upon entering pt room, pt had called out multiple times consecutively for pain medications. When going into room the pt was laying in bed resting. This RN apologized for pt wait and pt was informed once the current critical pt was stabilized a provider would be with her and would medicate her pain. The pt then sits up in the bed and begins yelling and cursing at this RN stating, "Do I need to go to another fuckin hospital since yall won't fuckin help me!?" The pt then begins trying to get out of the bed. This RN informs the pt that we will take care of her and have labs ordered already and will help with her pain once free. The pt repeats she needs to leave and go somewhere that she will be taken care of. This RN again reassures we are taking care of her but that is certainly her option to receive care at a different facility if she feels like her needs aren't being met here and I'm sorry she feels that way. The patient began yelling and cursing at this RN again and screams "get [place] my fuckin room" repeatedly "I want another nurse". Darci Current, charge RN notified and Lenna Sciara, [place] notified and to see pt.

## 2014-12-19 NOTE — ED Notes (Signed)
Per PT, Two night ago, patient started to have generalized abdominal pain that radiates to the back. Patient reports left side being more severe. Pt denies nausea, vomiting, diarrhea.

## 2014-12-19 NOTE — ED Notes (Signed)
Pt.has rude to staff .Crusing when calling on call systems.I WENT IN ONCE AND HAD ATALK  WITH.EXPLAIN WHAT WAS GOING ON SORRY THAT IT WAS TAKIN SO LONG.SHE ACT LIKE SHE UNDERSTOOD.2ND.TIME AROUND.SHE HAD NURSE Research officer, trade unionHANNAH RN UPSET, CRYING WHEN SHE LEFT OUT THE ROOM.WHEN TIFFANY PA.WENT IN THE ROOM MISS Monnier GOT UP IN  HER FACE SCREAMING CRUSING .SO I WHEN TALKED TO HER SHE SEEMED TO UNDERSTAND.WE DID THE PELIVC DONE .SHE CRYING STILL.

## 2014-12-19 NOTE — ED Notes (Signed)
Went into room to speak with patient before discharge with Edwina BarthHannah RN and PA Tiffany. Discharge instructions and results have been explained multiple times by PA and RN, copies of results given by PA for further clarification. Pt unahppy with discharge plan and stated that "you haven't done anything for me and now you are kicking me out." Pt cussing and verbally abusive with staff. Again results explained and treatment of antibiotics in her IV here explained and reiterated. Pt continues to cuss and told staff to get out of her room so she could get dressed. Wheelchair taken to bedside, pt currently refusing it for discharge. [employer] to bedside due to patient threatening staff.

## 2014-12-19 NOTE — ED Notes (Signed)
Patient went to CT

## 2014-12-19 NOTE — ED Notes (Signed)
Family at bedside. 

## 2014-12-19 NOTE — ED Notes (Signed)
Patient is stating, "Ana NobleY'all are kicking me out of the hospital. I have never been treated this way.I am going to sue this hospital and make all of you loose your job. It's because I am black. Y'all are kicking me out." Patient is crying and yelling at PA, Tiffany. Patient is alert and oriented x4.

## 2014-12-19 NOTE — Discharge Instructions (Signed)

## 2014-12-19 NOTE — ED Notes (Signed)
Pt stated that no on was answering her calls. This RN placed her direct line on the board so that the patient could call this RN directly if needed.

## 2014-12-19 NOTE — ED Provider Notes (Signed)
CSN: [id number]     Arrival date & time 12/19/14  1044 History   First MD Initiated Contact with Patient 12/19/14 1048     Chief Complaint  Patient presents with  . Abdominal Pain     (Consider location/radiation/quality/duration/timing/severity/associated sxs/prior Treatment) HPI   PCP: Billee Cashing, MD Patient delivered baby two months ago vaginally. Two days ago she developed some diffuse abdominal pain and back pain. She also has dysuria and a low grade fever. Hx of UTI's and kidney infections. Hx of STD. Her pain has been worsening and she has been unable to sleep due to the pain. Denies nausea, vomiting or diarrhea. She has a low grade fever. The pain is described as stabbing, pressure, and SOB her pain is a 6/10.   ROS: The patient denies diaphoresis, headache, weakness (general or focal), confusion, change of vision,  dysphagia, aphagia, shortness of breath, nausea, vomiting, diarrhea, lower extremity swelling, rash, neck pain, chest pain   Past Medical History  Diagnosis Date  . Chlamydia Jan 2014  . Abnormal Pap smear   . Asthma   . Kidney infection    Past Surgical History  Procedure Laterality Date  . Wisdom tooth extraction     Family History  Problem Relation Age of Onset  . Hypertension Mother   . Hypertension Maternal Grandmother    Social History  Substance Use Topics  . Smoking status: Former Smoker    Quit date: 04/25/2014  . Smokeless tobacco: Never Used  . Alcohol Use: No     Comment: occasional/not since pregnancy   OB History    Gravida Para Term Preterm AB TAB SAB Ectopic Multiple Living   0 1 0 1 0 0 5     Review of Systems  10 Systems reviewed and are negative for acute change except as noted in the HPI.  \  Allergies  Review of patient's allergies indicates no known allergies.  Home Medications   Prior to Admission medications   Medication Sig Start Date End Date Taking? Authorizing Provider  ciprofloxacin (CIPRO) 500  MG tablet Take 1 tablet (500 mg total) by mouth 2 (two) times daily. 12/19/14   Marlon Pel, PA-C  HYDROcodone-acetaminophen (NORCO/VICODIN) 5-325 MG tablet Take 1-2 tablets by mouth every 4 (four) hours as needed. 12/19/14   Kindle Strohmeier Neva Seat, PA-C  ibuprofen (ADVIL,MOTRIN) 600 MG tablet Take 1 tablet (600 mg total) by mouth every 6 (six) hours as needed for mild pain, moderate pain or cramping. Patient not taking: Reported on 12/19/2014 10/20/14   Cheral Marker, CNM  ondansetron (ZOFRAN) 4 MG tablet Take 1 tablet (4 mg total) by mouth every 6 (six) hours. 12/19/14   Marlon Pel, PA-C  oxyCODONE-acetaminophen (PERCOCET/ROXICET) 5-325 MG per tablet Take 1-2 tablets by mouth every 6 (six) hours as needed. Patient not taking: Reported on 12/19/2014 10/20/14   Tereso Newcomer, MD  Prenatal Vit-Fe Fumarate-FA (PRENATAL VITAMINS) 28-0.8 MG TABS Take 1 tablet by mouth daily. Patient not taking: Reported on 12/19/2014 03/14/14   Duane Lope, NP   BP 126/67 mmHg  Pulse 85  Temp(Src) 98.1 F (36.7 C) (Oral)  Resp 18  Ht  (1.626 m)  Wt 160 lb (72.576 kg)  BMI 27.45 kg/m2  SpO2 98% Physical Exam  Constitutional: She appears well-developed and well-nourished. No distress.  HENT:  Head: Normocephalic and atraumatic.  Eyes: Pupils are equal, round, and reactive to light.  Neck: Normal range of motion. Neck supple.  Cardiovascular: Normal  rate and regular rhythm.   Pulmonary/Chest: Effort normal.  Abdominal: Soft. Bowel sounds are normal. She exhibits no distension. There is tenderness (diffuse). There is guarding (voluntary guarding). There is no rigidity, no rebound and no CVA tenderness.  Genitourinary: Uterus normal. Cervix exhibits no motion tenderness, no discharge and no friability. No erythema or tenderness in the vagina. Vaginal discharge (large amount of white/yellow discharge in vaginal vault) found.  Neurological: She is alert.  Skin: Skin is warm and dry.  Nursing note and  vitals reviewed.   ED Course  Procedures (including critical care time) Labs Review Labs Reviewed  WET PREP, GENITAL - Abnormal; Notable for the following:    Clue Cells Wet Prep HPF POC FEW (*)    WBC, Wet Prep HPF POC MANY (*)    All other components within normal limits  URINALYSIS, ROUTINE W REFLEX MICROSCOPIC (NOT AT [facility]) - Abnormal; Notable for the following:    Color, Urine AMBER (*)    APPearance HAZY (*)    Bilirubin Urine SMALL (*)    Ketones, ur 15 (*)    Leukocytes, UA MODERATE (*)    All other components within normal limits  CBC WITH DIFFERENTIAL/PLATELET - Abnormal; Notable for the following:    WBC 14.0 (*)    Hemoglobin 11.3 (*)    HCT 35.0 (*)    Neutro Abs 12.8 (*)    All other components within normal limits  COMPREHENSIVE METABOLIC PANEL - Abnormal; Notable for the following:    Sodium 131 (*)    Chloride 98 (*)    All other components within normal limits  LIPASE, BLOOD - Abnormal; Notable for the following:    Lipase 18 (*)    All other components within normal limits  URINE MICROSCOPIC-ADD ON - Abnormal; Notable for the following:    Squamous Epithelial / LPF MANY (*)    Bacteria, UA MANY (*)    All other components within normal limits  POC URINE PREG, ED  GC/CHLAMYDIA PROBE AMP ([place]) NOT AT [facility]    Imaging Review Ct Abdomen Pelvis W Contrast  12/19/2014  CLINICAL DATA:  24 year old female with acute abdominal and pelvic pain for 2 days. EXAM: CT ABDOMEN AND PELVIS WITH CONTRAST TECHNIQUE: Multidetector CT imaging of the abdomen and pelvis was performed using the standard protocol following bolus administration of intravenous contrast. CONTRAST:  OMNIPAQUE IOHEXOL 300 MG/ML  SOLN COMPARISON:  None. FINDINGS: Lower chest:  Unremarkable Hepatobiliary: The liver and gallbladder are unremarkable. There is no evidence of biliary dilatation. Pancreas: Unremarkable Spleen: Unremarkable Adrenals/Urinary Tract: The kidneys, adrenal glands and  bladder are unremarkable. Stomach/Bowel: Unremarkable. There is no evidence of bowel obstruction or focal bowel wall thickening. The appendix is not identified but there is no evidence of enlarged tubular structure or inflammation in the pericecal region. Vascular/Lymphatic: Unremarkable. No enlarged lymph nodes or abdominal aortic aneurysm. Reproductive: No definite abnormalities. Other: Trace amount of free pelvic fluid is likely physiologic. There is no evidence of abscess or pneumoperitoneum. Musculoskeletal: No acute or suspicious abnormalities. IMPRESSION: No evidence of acute or significant abnormality. Electronically Signed   By: Harmon Pier M.D.   On: 12/19/2014 16:28   I have personally reviewed and evaluated these images and lab results as part of my medical decision-making.   EKG Interpretation None      MDM   Final diagnoses:  Pyelonephritis   CT scan is negative. Urinalysis is concerning for pyelonephritis or vaginal infection. Wet prep shows MANY clue  cells. Given IV Rocephin but not able to give Azithro due to patient not accepting any other medications at this time and wants to be "fixed and taken care of now". The patient was belligerent and abusive/aggressive during stay. Lots of tears, yelling and cursing at myself and staff. Concern about possible appendicitis therefore I tolerated more than. Eventually she let me do a pelvic exam with Malachi BondsIda close by but unable to do bimanual as patient was being very difficult.  The patient was very challenging to treat during this visit. She was not receptive to work-up and treatment. Eventually she was escorted out by staff. Concern for pyelo. Cultures pending. Given rx for Cipro and Azithromycin -- pt ultimately discharged due to need to be escorted out by [employer]/security  Filed Vitals:   12/19/14 1501  BP: 126/67  Pulse: 85  Temp:   Resp: [id number]18         Royale Swamy, PA-C 12/22/14 1154  Marlon Peliffany Jordyan Hardiman, PA-C 12/22/14 1221  Melene Planan  Floyd, DO 12/25/14 [id number]

## 2014-12-19 NOTE — ED Notes (Signed)
Pt on the phone talking to her mother. Patient report to her mother, "They are not doing anything for me." Pt made aware of the plan of care. Pt explained that the PA would be back in here to give her results and the RN was not to release those results. Pt states, "Everybody just keeps being vague with me and not answering any of my questions." Patient loud on the phone and angrily talking to Mother about care.

## 2014-12-19 NOTE — ED Notes (Signed)
Patient called out. This RN went into patient's room and she stated she wanted to know her results. Pt notified I would get Tiffany, PA and be back in room.

## 2014-12-20 LAB — GC/CHLAMYDIA PROBE AMP (~~LOC~~) NOT AT ARMC
Chlamydia: NEGATIVE
Neisseria Gonorrhea: POSITIVE — AB

## 2014-12-21 ENCOUNTER — Telehealth (HOSPITAL_COMMUNITY): Payer: Self-pay

## 2014-12-21 NOTE — Telephone Encounter (Signed)
Positive for gonorrhea. Chart sent to EDP office for review.  

## 2014-12-22 ENCOUNTER — Telehealth (HOSPITAL_BASED_OUTPATIENT_CLINIC_OR_DEPARTMENT_OTHER): Payer: Self-pay | Admitting: Emergency Medicine

## 2014-12-28 ENCOUNTER — Telehealth (HOSPITAL_COMMUNITY): Payer: Self-pay

## 2014-12-28 NOTE — Telephone Encounter (Signed)
Pt calling after receiving letter in mail.  ID verified x 2.  Pt informed (+) Gonorrhea and needs addl tx.  Rx called and given to RPh @ [facility] [phone].  Pt informed of need to notify partner for testing and tx and abstain from sex x 2 wks from tx date.

## 2015-01-04 DIAGNOSIS — Z87891 Personal history of nicotine dependence: Secondary | ICD-10-CM | POA: Insufficient documentation

## 2015-01-04 DIAGNOSIS — Z8619 Personal history of other infectious and parasitic diseases: Secondary | ICD-10-CM | POA: Insufficient documentation

## 2015-01-04 DIAGNOSIS — R1084 Generalized abdominal pain: Secondary | ICD-10-CM | POA: Diagnosis present

## 2015-01-04 DIAGNOSIS — Z3202 Encounter for pregnancy test, result negative: Secondary | ICD-10-CM | POA: Diagnosis not present

## 2015-01-04 DIAGNOSIS — Z792 Long term (current) use of antibiotics: Secondary | ICD-10-CM | POA: Diagnosis not present

## 2015-01-04 DIAGNOSIS — J45909 Unspecified asthma, uncomplicated: Secondary | ICD-10-CM | POA: Diagnosis not present

## 2015-01-04 DIAGNOSIS — Z87448 Personal history of other diseases of urinary system: Secondary | ICD-10-CM | POA: Diagnosis not present

## 2015-01-04 DIAGNOSIS — N739 Female pelvic inflammatory disease, unspecified: Secondary | ICD-10-CM | POA: Diagnosis not present

## 2015-01-05 ENCOUNTER — Emergency Department (HOSPITAL_COMMUNITY)
Admission: EM | Admit: 2015-01-05 | Discharge: 2015-01-05 | Disposition: A | Discharge status: Home / Self Care | Payer: Medicaid Other | Attending: Emergency Medicine | Admitting: Emergency Medicine

## 2015-01-05 ENCOUNTER — Encounter (HOSPITAL_COMMUNITY): Payer: Self-pay | Admitting: *Deleted

## 2015-01-05 DIAGNOSIS — N730 Acute parametritis and pelvic cellulitis: Secondary | ICD-10-CM

## 2015-01-05 LAB — URINALYSIS, ROUTINE W REFLEX MICROSCOPIC
Glucose, UA: NEGATIVE mg/dL
Hgb urine dipstick: NEGATIVE
Ketones, ur: >80 mg/dL — AB
NITRITE: NEGATIVE
PH: 6 (ref 5.0–8.0)
Protein, ur: 30 mg/dL — AB
SPECIFIC GRAVITY, URINE: 1.037 — AB (ref 1.005–1.030)
UROBILINOGEN UA: 1 mg/dL (ref 0.0–1.0)

## 2015-01-05 LAB — URINE MICROSCOPIC-ADD ON

## 2015-01-05 LAB — POC URINE PREG, ED: Preg Test, Ur: NEGATIVE

## 2015-01-05 MED ORDER — DOXYCYCLINE HYCLATE 100 MG PO TABS: 100.0000 mg | ORAL_TABLET | Freq: Two times a day (BID) | ORAL | Status: DC

## 2015-01-05 MED ORDER — MORPHINE SULFATE (PF) 4 MG/ML IV SOLN
6.0000 mg | Freq: Once | INTRAVENOUS | Status: AC
  Administered 2015-01-05: 6 mg via INTRAVENOUS
  Filled 2015-01-05: qty 2

## 2015-01-05 MED ORDER — AZITHROMYCIN 250 MG PO TABS: 1000.0000 mg | ORAL_TABLET | Freq: Once | ORAL | Status: DC

## 2015-01-05 MED ORDER — DOXYCYCLINE HYCLATE 100 MG PO TABS
100.0000 mg | ORAL_TABLET | Freq: Once | ORAL | Status: AC
  Administered 2015-01-05: 100 mg via ORAL
  Filled 2015-01-05: qty 1

## 2015-01-05 MED ORDER — ONDANSETRON HCL 4 MG/2ML IJ SOLN
4.0000 mg | Freq: Once | INTRAMUSCULAR | Status: AC
  Administered 2015-01-05: 4 mg via INTRAVENOUS
  Filled 2015-01-05: qty 2

## 2015-01-05 NOTE — ED Notes (Signed)
MD at bedside. 

## 2015-01-05 NOTE — ED Provider Notes (Signed)
CSN: [id number]     Arrival date & time 01/04/15  2355 History  By signing my name below, I, Ana [name], attest that this documentation has been prepared under the direction and in the presence of Tomasita CrumbleAdeleke Jade Burkard, MD. Electronically Signed: Angelene GiovanniEmmanuella [name], ED Scribe. 01/05/2015. 12:41 AM.     Chief Complaint  Patient presents with  . Abdominal Pain   The history is provided by the patient. No language interpreter was used.   HPI Comments: Ana [name] is a 24 y.o. female who presents to the Emergency Department complaining of a gradually worsening moderate sharp "pins" abdominal pain onset today. She reports associated dysuria. She denies any hematuria. She reports that she was prescribed Cipro 2 weeks ago after being diagnosed with a kidney infection but she was not able to get the medication due to financial constraints. She states that she gave birth August 1st and her menstrual cycle has become regular.    Past Medical History  Diagnosis Date  . Chlamydia Jan 2014  . Abnormal Pap smear   . Asthma   . Kidney infection    Past Surgical History  Procedure Laterality Date  . Wisdom tooth extraction     Family History  Problem Relation Age of Onset  . Hypertension Mother   . Hypertension Maternal Grandmother    Social History  Substance Use Topics  . Smoking status: Former Smoker    Quit date: 04/25/2014  . Smokeless tobacco: Never Used  . Alcohol Use: No     Comment: occasional/not since pregnancy   OB History    Gravida Para Term Preterm AB TAB SAB Ectopic Multiple Living   6 5 5  0 1 0 1 0 0 5     Review of Systems  Gastrointestinal: Positive for abdominal pain.  Genitourinary: Positive for dysuria. Negative for hematuria.    A complete 10 system review of systems was obtained and all systems are negative except as noted in the HPI and PMH.    Allergies  Review of patient's allergies indicates no known allergies.  Home Medications   Prior to Admission  medications   Medication Sig Start Date End Date Taking? Authorizing Provider  ciprofloxacin (CIPRO) 500 MG tablet Take 1 tablet (500 mg total) by mouth 2 (two) times daily. 12/19/14   Tiffany Neva SeatGreene, PA-C  ibuprofen (ADVIL,MOTRIN) 600 MG tablet Take 1 tablet (600 mg total) by mouth every 6 (six) hours as needed for mild pain, moderate pain or cramping. Patient not taking: Reported on 12/19/2014 10/20/14   Cheral MarkerKimberly R Booker, CNM  ondansetron (ZOFRAN) 4 MG tablet Take 1 tablet (4 mg total) by mouth every 6 (six) hours. 12/19/14   Marlon Peliffany Greene, PA-C  oxyCODONE-acetaminophen (PERCOCET/ROXICET) 5-325 MG per tablet Take 1-2 tablets by mouth every 6 (six) hours as needed. Patient not taking: Reported on 12/19/2014 10/20/14   Tereso NewcomerUgonna A Anyanwu, MD  Prenatal Vit-Fe Fumarate-FA (PRENATAL VITAMINS) 28-0.8 MG TABS Take 1 tablet by mouth daily. Patient not taking: Reported on 12/19/2014 03/14/14   Duane LopeJennifer I Rasch, NP   BP 134/77 mmHg  Pulse 96  Temp(Src) 99.1 F (37.3 C)  Resp 16  Ht 5\' 4"  (1.626 m)  Wt 155 lb 7 oz (70.506 kg)  BMI 26.67 kg/m2  SpO2 98%  LMP 01/02/2015  Breastfeeding? Yes Physical Exam  Constitutional: She is oriented to person, place, and time. She appears well-developed and well-nourished. No distress.  HENT:  Head: Normocephalic and atraumatic.  Nose: Nose normal.  Mouth/Throat: Oropharynx is clear  and moist. No oropharyngeal exudate.  Eyes: Conjunctivae and EOM are normal. Pupils are equal, round, and reactive to light. No scleral icterus.  Neck: Normal range of motion. Neck supple. No JVD present. No tracheal deviation present. No thyromegaly present.  Cardiovascular: Normal rate, regular rhythm and normal heart sounds.  Exam reveals no gallop and no friction rub.   No murmur heard. Pulmonary/Chest: Effort normal and breath sounds normal. No respiratory distress. She has no wheezes. She exhibits no tenderness.  Abdominal: Soft. Bowel sounds are normal. She exhibits no  distension and no mass. There is tenderness. There is no rebound and no guarding.  Diffuse abdominal TTP  Musculoskeletal: Normal range of motion. She exhibits no edema or tenderness.  Lymphadenopathy:    She has no cervical adenopathy.  Neurological: She is alert and oriented to person, place, and time. No cranial nerve deficit. She exhibits normal muscle tone.  Skin: Skin is warm and dry. No rash noted. No erythema. No pallor.  Nursing note and vitals reviewed.   ED Course  Procedures (including critical care time) DIAGNOSTIC STUDIES: Oxygen Saturation is 98% on RA, normal by my interpretation.    COORDINATION OF CARE: 12:44 AM- Pt advised of plan for treatment and pt agrees.    Labs Review Labs Reviewed  URINALYSIS, ROUTINE W REFLEX MICROSCOPIC (NOT AT [facility]) - Abnormal; Notable for the following:    Color, Urine ORANGE (*)    APPearance CLOUDY (*)    Specific Gravity, Urine 1.037 (*)    Bilirubin Urine MODERATE (*)    Ketones, ur >80 (*)    Protein, ur 30 (*)    Leukocytes, UA SMALL (*)    All other components within normal limits  URINE MICROSCOPIC-ADD ON - Abnormal; Notable for the following:    Squamous Epithelial / LPF FEW (*)    All other components within normal limits  POC URINE PREG, ED    Imaging Review No results found. Tomasita Crumble, MD has personally reviewed and evaluated these images and lab results as part of his medical decision-making.   EKG Interpretation None      MDM   Final diagnoses:  None    Patient presents to the ED for evaluation of abdominal pain.  She was recently seen with neg UA and pelvic exam + for gonorrhea.  Pelvic exam deferred today due to recent findings.  Patient has likely progressed to PID now.  She already received ceftriaxone.  Will DC with 14 days for doxycycline.  She appears well and in NAD.  VS remain within her normal limits and she is safe for DC.  I personally performed the services described in this documentation,  which was scribed in my presence. The recorded information has been reviewed and is accurate.      Tomasita Crumble, MD 01/05/15 [phone]

## 2015-01-05 NOTE — ED Notes (Signed)
Pt able to dress and ambulate independently 

## 2015-01-05 NOTE — Discharge Instructions (Signed)
Pelvic Inflammatory Disease Ms. Ana [name], see an OB/GYN physician within 3 days for close follow up of your infection in your pelvis.  Take all antibiotics until completely finished.  If any symptoms worsen, come back to the ED immediately.  Thank you. Pelvic inflammatory disease (PID) is an infection in some or all of the female organs. PID can be in the uterus, ovaries, fallopian tubes, or the surrounding tissues that are inside the lower belly area (pelvis). PID can lead to lasting problems if it is not treated. To check for this disease, your doctor may:  Do a physical exam.  Do blood tests, urine tests, or a pregnancy test.  Look at your vaginal discharge.  Do tests to look inside the pelvis.  Test you for other infections. HOME CARE  Take over-the-counter and prescription medicines only as told by your doctor.  If you were prescribed an antibiotic medicine, take it as told by your doctor. Do not stop taking it even if you start to feel better.  Do not have sex until treatment is done or as told by your doctor.  Tell your sex partner if you have PID. Your partner may need to be treated.  Keep all follow-up visits as told by your doctor. This is important.  Your doctor may test you for infection again 3 months after you are treated. GET HELP IF:  You have more fluid (discharge) coming from your vagina or fluid that is not normal.  Your pain does not improve.  You throw up (vomit).  You have a fever.  You cannot take your medicines.  Your partner has a sexually transmitted disease (STD).  You have pain when you pee (urinate). GET HELP RIGHT AWAY IF:  You have more belly (abdominal) or lower belly pain.  You have chills.  You are not better after 72 hours.   This information is not intended to replace advice given to you by your health care provider. Make sure you discuss any questions you have with your health care provider.   Document Released: 05/22/2008 Document  Revised: 11/14/2014 Document Reviewed: 04/02/2014 Elsevier Interactive Patient Education [employer]2016 Elsevier Inc.

## 2015-01-05 NOTE — ED Notes (Signed)
The pt ius c/o abd pain all day she reports that she feels like she has a uti  lmp yesterday

## 2015-01-05 NOTE — ED Notes (Signed)
Pt called out c/o nausea.

## 2015-01-05 NOTE — ED Notes (Signed)
Pt confirmed she is not breastfeeding.

## 2015-01-06 ENCOUNTER — Inpatient Hospital Stay (HOSPITAL_COMMUNITY): Payer: Medicaid Other

## 2015-01-06 ENCOUNTER — Inpatient Hospital Stay (HOSPITAL_COMMUNITY)
Admission: AD | Admit: 2015-01-06 | Discharge: 2015-01-10 | DRG: 759 | Disposition: A | Discharge status: Home / Self Care | Payer: Medicaid Other | Source: Ambulatory Visit | Attending: Family Medicine | Admitting: Family Medicine

## 2015-01-06 ENCOUNTER — Encounter (HOSPITAL_COMMUNITY): Payer: Self-pay | Admitting: *Deleted

## 2015-01-06 ENCOUNTER — Emergency Department (HOSPITAL_COMMUNITY)
Admission: EM | Admit: 2015-01-06 | Discharge: 2015-01-06 | Discharge status: Against Medical Advice | Payer: Medicaid Other | Source: Home / Self Care

## 2015-01-06 DIAGNOSIS — J45909 Unspecified asthma, uncomplicated: Secondary | ICD-10-CM

## 2015-01-06 DIAGNOSIS — N730 Acute parametritis and pelvic cellulitis: Secondary | ICD-10-CM

## 2015-01-06 DIAGNOSIS — R109 Unspecified abdominal pain: Secondary | ICD-10-CM

## 2015-01-06 DIAGNOSIS — N83202 Unspecified ovarian cyst, left side: Secondary | ICD-10-CM

## 2015-01-06 DIAGNOSIS — Z87891 Personal history of nicotine dependence: Secondary | ICD-10-CM

## 2015-01-06 DIAGNOSIS — A5424 Gonococcal female pelvic inflammatory disease: Principal | ICD-10-CM | POA: Diagnosis present

## 2015-01-06 DIAGNOSIS — N7093 Salpingitis and oophoritis, unspecified: Secondary | ICD-10-CM | POA: Diagnosis present

## 2015-01-06 LAB — CBC WITH DIFFERENTIAL/PLATELET
BASOS PCT: 0 %
Basophils Absolute: 0 10*3/uL (ref 0.0–0.1)
Eosinophils Absolute: 0 10*3/uL (ref 0.0–0.7)
Eosinophils Relative: 0 %
HEMATOCRIT: 30.5 % — AB (ref 36.0–46.0)
HEMOGLOBIN: 10 g/dL — AB (ref 12.0–15.0)
LYMPHS ABS: 1.5 10*3/uL (ref 0.7–4.0)
LYMPHS PCT: 13 %
MCH: 27.3 pg (ref 26.0–34.0)
MCHC: 32.8 g/dL (ref 30.0–36.0)
MCV: 83.3 fL (ref 78.0–100.0)
MONOS PCT: 6 %
Monocytes Absolute: 0.7 10*3/uL (ref 0.1–1.0)
NEUTROS ABS: 9 10*3/uL — AB (ref 1.7–7.7)
NEUTROS PCT: 81 %
Platelets: 270 10*3/uL (ref 150–400)
RBC: 3.66 MIL/uL — ABNORMAL LOW (ref 3.87–5.11)
RDW: 14.4 % (ref 11.5–15.5)
WBC: 11.2 10*3/uL — ABNORMAL HIGH (ref 4.0–10.5)

## 2015-01-06 LAB — URINE CULTURE

## 2015-01-06 MED ORDER — PROMETHAZINE HCL 25 MG/ML IJ SOLN
25.0000 mg | Freq: Once | INTRAMUSCULAR | Status: AC
  Administered 2015-01-06: 25 mg via INTRAVENOUS
  Filled 2015-01-06: qty 1

## 2015-01-06 MED ORDER — KETOROLAC TROMETHAMINE 30 MG/ML IJ SOLN
30.0000 mg | Freq: Once | INTRAMUSCULAR | Status: AC
  Administered 2015-01-06: 30 mg via INTRAMUSCULAR
  Filled 2015-01-06: qty 1

## 2015-01-06 MED ORDER — DEXTROSE 5 % IV SOLN: 250.0000 mg | Freq: Once | INTRAVENOUS | Status: DC

## 2015-01-06 MED ORDER — AZITHROMYCIN 250 MG PO TABS
1000.0000 mg | ORAL_TABLET | Freq: Once | ORAL | Status: AC
  Administered 2015-01-06: 1000 mg via ORAL
  Filled 2015-01-06: qty 4

## 2015-01-06 MED ORDER — HYDROMORPHONE HCL 1 MG/ML IJ SOLN
1.0000 mg | Freq: Once | INTRAMUSCULAR | Status: AC
Start: 2015-01-06 — End: 2015-01-06
  Administered 2015-01-06: 1 mg via INTRAVENOUS
  Filled 2015-01-06: qty 1

## 2015-01-06 MED ORDER — DEXTROSE 5 % IV SOLN
1.0000 g | Freq: Once | INTRAVENOUS | Status: AC
  Administered 2015-01-06: 1 g via INTRAVENOUS
  Filled 2015-01-06: qty 10

## 2015-01-06 MED ORDER — SODIUM CHLORIDE 0.9 % IV SOLN
INTRAVENOUS | Status: AC
  Administered 2015-01-06: 22:00:00 via INTRAVENOUS

## 2015-01-06 NOTE — MAU Note (Signed)
[facility] went [facility] last week with co flank pain and was told she had a kidney infection.  Was given cipro and started that course of treatment.  Friday  The 28, she went to [facility] to discover PID and was given doxycycline and was told to stop the cipro.  Continues with left flank pain and lower pelvic pain.

## 2015-01-06 NOTE — ED Notes (Signed)
Pt was tx in ED 2 days ago for PID.  States pain and urinary symptoms have not improved even though she is taking doxycycline.

## 2015-01-06 NOTE — MAU Provider Note (Signed)
History     CSN: [id number]  Arrival date and time: 01/06/15 [id number]   First Provider Initiated Contact with Patient 01/06/15 1932      Chief Complaint  Patient presents with  . Abdominal Pain   HPI Comments: Ana [name] is a 24 y.o. [id number] presenting with abdominal pain mostly in left suprapubic region and also left upper quadrant and left flank. It's been present since 12/19/2014 when [facility]up that included a CT of abdomen and pelvis which was normal. She had white count of 14,000. Her cervical culture came back positive for GC and she received ceftriaxone IV at that visit. She was given a prescription for Cipro for presumed UTI but never filled it. [facility] with dx PID and she filled the Rx for doxycycline 14 day course, starting yesterday. Urine culture neg 01/05/2015. Unsure if partner treated but states he's "messing around."   Abdominal Pain This is a recurrent problem. The current episode started 1 to 4 weeks ago. The onset quality is gradual. The problem occurs constantly (while awake, pain waxes and wanes). The most recent episode lasted 2 weeks. The problem has been gradually worsening. The pain is located in the LLQ, left flank and LUQ. The pain is at a severity of 7/10. The pain is moderate. The quality of the pain is sharp (stabbing). The abdominal pain does not radiate. Associated symptoms include dysuria and a fever. Pertinent negatives include no anorexia, constipation, diarrhea, hematuria, nausea or vomiting. The pain is aggravated by movement. The pain is relieved by nothing. Treatments tried: ibuprofen 400mg  yesterday and 200 mg today. The treatment provided mild relief.    OB History  Gravida Para Term Preterm AB SAB TAB Ectopic Multiple Living  6 5 5  0 1 1 0 0 0 5    # Outcome Date GA Lbr Len/2nd Weight Sex Delivery Anes PTL Lv  6 Term 10/18/14 [id number] 01:57 / 00:35 5 lb 9.4 oz (2.535 kg) M Vag-Spont EPI  Y     Comments: WNL   5 SAB 08/29/12 6670w6d 112:20 / 00:03 6 oz (0.17 kg) M Vag-Spont None  FD  4 Term 05/13/10   6 lb (2.722 kg) F Vag-Spont   Y  3 Term 06/11/08   6 lb (2.722 kg) F Vag-Spont   Y  2 Term 08/21/06   6 lb 14 oz (3.118 kg) M Vag-Spont   Y  1 Term 03/30/05   7 lb (3.175 kg) F Vag-Spont   Y      Past Medical History  Diagnosis Date  . Chlamydia Jan 2014  . Abnormal Pap smear   . Asthma   . Kidney infection     Past Surgical History  Procedure Laterality Date  . Wisdom tooth extraction      Family History  Problem Relation Age of Onset  . Hypertension Mother   . Hypertension Maternal Grandmother     Social History  Substance Use Topics  . Smoking status: Former Smoker    Quit date: 04/25/2014  . Smokeless tobacco: Never Used  . Alcohol Use: No     Comment: occasional/not since pregnancy    Allergies: No Known Allergies  Prescriptions prior to admission  Medication Sig Dispense Refill Last Dose  . ciprofloxacin (CIPRO) 500 MG tablet Take 1 tablet (500 mg total) by mouth 2 (two) times daily. 28 tablet 0 01/04/2015 at Unknown time  . doxycycline (VIBRA-TABS) 100 MG tablet Take 1 tablet (100  mg total) by mouth 2 (two) times daily. 28 tablet 0 01/06/2015 at Unknown time  . ibuprofen (ADVIL,MOTRIN) 200 MG tablet Take 400 mg by mouth every 6 (six) hours as needed for moderate pain.   01/06/2015 at Unknown time  . ondansetron (ZOFRAN) 4 MG tablet Take 1 tablet (4 mg total) by mouth every 6 (six) hours. 12 tablet 0 01/05/2015 at Unknown time  . oxyCODONE-acetaminophen (PERCOCET/ROXICET) 5-325 MG per tablet Take 1-2 tablets by mouth every 6 (six) hours as needed. 30 tablet 0 Past Week at Unknown time    Review of Systems  Constitutional: Positive for fever.  Gastrointestinal: Positive for abdominal pain. Negative for nausea, vomiting, diarrhea, constipation and anorexia.  Genitourinary: Positive for dysuria. Negative for hematuria.   Physical Exam   Blood pressure 145/78, pulse 91,  temperature 98.8 F (37.1 C), temperature source Oral, resp. rate 16, last menstrual period 12/30/2014, currently breastfeeding.  Physical Exam  Constitutional: She is oriented to person, place, and time. She appears well-developed and well-nourished. She appears distressed.  HENT:  Head: Normocephalic.  Eyes: Pupils are equal, round, and reactive to light.  Neck: Normal range of motion.  Cardiovascular: Normal rate.   Respiratory: Effort normal.  GI: Soft. She exhibits mass. She exhibits no distension. There is tenderness. There is guarding. There is no rebound.  Tender diffusely with voluntary guarding LLQ, no rebound  Genitourinary: Vagina normal. No vaginal discharge found.  NEFG Scant clear vaginal discharge.  CMT present Uterus and left adnexal region moderately tender; mild tenderness right adnexa. No adnexal mass appreciated  Musculoskeletal: Normal range of motion.  Mild left CVAT  Neurological: She is alert and oriented to person, place, and time.  Skin: Skin is warm and dry.  Psychiatric: She has a normal mood and affect. Her behavior is normal.    MAU Course  Procedures Results for orders placed or performed during the hospital encounter of 01/06/15 (from the past 24 hour(s))  CBC with Differential     Status: Abnormal   Collection Time: 01/06/15  8:05 PM  Result Value Ref Range   WBC 11.2 (H) 4.0 - 10.5 K/uL   RBC 3.66 (L) 3.87 - 5.11 MIL/uL   Hemoglobin 10.0 (L) 12.0 - 15.0 g/dL   HCT 16.1 (L) 09.6 - 04.5 %   MCV 83.3 78.0 - 100.0 fL   MCH 27.3 26.0 - 34.0 pg   MCHC 32.8 30.0 - 36.0 g/dL   RDW 40.9 81.1 - 91.4 %   Platelets 270 150 - 400 K/uL   Neutrophils Relative % 81 %   Neutro Abs 9.0 (H) 1.7 - 7.7 K/uL   Lymphocytes Relative 13 %   Lymphs Abs 1.5 0.7 - 4.0 K/uL   Monocytes Relative 6 %   Monocytes Absolute 0.7 0.1 - 1.0 K/uL   Eosinophils Relative 0 %   Eosinophils Absolute 0.0 0.0 - 0.7 K/uL   Basophils Relative 0 %   Basophils Absolute 0.0 0.0 -  0.1 K/uL      MDM Toradol  IM given Pelvic US ordered Care assumed by Judeth Horn at 2115. PO zithromax IV started - IV phenergan, dilaudid, & rocephin given.  Pt initially reports mild improvement in pain, but states pain is coming back; pt insistent on being admitted for continue IV abx & pain medication.  Discussed pt with Dr. Shawnie Pons who will admit to [facility] for observation for IV antibiotics & pain control.  Assessment and Plan    POE,DEIRDRE 01/06/2015, 7:58 PM  A:  1. PID (acute pelvic inflammatory disease)   2. Gonococcal PID, female   3. Left pyosalpinx    P: Admit to [facility] Unit for observation IV zosyn Antiemetics & pain medication  Judeth Horn, NP 01/07/2015 12:00 AM

## 2015-01-06 NOTE — MAU Note (Signed)
Pt presents to MAU with complaints of abdominal pain. Pt [facility] and was told  she had gonorrhea, chlamydia and PID. Started on doxycycline

## 2015-01-07 ENCOUNTER — Encounter (HOSPITAL_COMMUNITY): Payer: Self-pay | Admitting: *Deleted

## 2015-01-07 DIAGNOSIS — Z87891 Personal history of nicotine dependence: Secondary | ICD-10-CM | POA: Diagnosis not present

## 2015-01-07 DIAGNOSIS — N730 Acute parametritis and pelvic cellulitis: Secondary | ICD-10-CM | POA: Diagnosis not present

## 2015-01-07 DIAGNOSIS — N7093 Salpingitis and oophoritis, unspecified: Secondary | ICD-10-CM | POA: Diagnosis not present

## 2015-01-07 DIAGNOSIS — A5424 Gonococcal female pelvic inflammatory disease: Secondary | ICD-10-CM | POA: Diagnosis present

## 2015-01-07 LAB — URINALYSIS, ROUTINE W REFLEX MICROSCOPIC
Glucose, UA: NEGATIVE mg/dL
Hgb urine dipstick: NEGATIVE
KETONES UR: 40 mg/dL — AB
Leukocytes, UA: NEGATIVE
NITRITE: NEGATIVE
PH: 6.5 (ref 5.0–8.0)
Protein, ur: NEGATIVE mg/dL
Specific Gravity, Urine: 1.02 (ref 1.005–1.030)
Urobilinogen, UA: 4 mg/dL — ABNORMAL HIGH (ref 0.0–1.0)

## 2015-01-07 MED ORDER — PIPERACILLIN-TAZOBACTAM 3.375 G IVPB
3.3750 g | Freq: Three times a day (TID) | INTRAVENOUS | Status: DC
  Administered 2015-01-07 – 2015-01-09 (×7): 3.375 g via INTRAVENOUS
  Filled 2015-01-07 (×10): qty 50

## 2015-01-07 MED ORDER — ONDANSETRON HCL 4 MG/2ML IJ SOLN
4.0000 mg | Freq: Four times a day (QID) | INTRAMUSCULAR | Status: DC | PRN
Start: 2015-01-07 — End: 2015-01-09
  Administered 2015-01-07 – 2015-01-08 (×5): 4 mg via INTRAVENOUS
  Filled 2015-01-07 (×6): qty 2

## 2015-01-07 MED ORDER — ONDANSETRON HCL 4 MG PO TABS: 4.0000 mg | ORAL_TABLET | Freq: Four times a day (QID) | ORAL | Status: DC | PRN

## 2015-01-07 MED ORDER — DEXTROSE IN LACTATED RINGERS 5 % IV SOLN
INTRAVENOUS | Status: DC
  Administered 2015-01-07 – 2015-01-08 (×6): via INTRAVENOUS

## 2015-01-07 MED ORDER — HYDROMORPHONE HCL 1 MG/ML IJ SOLN
1.0000 mg | INTRAMUSCULAR | Status: DC | PRN
  Administered 2015-01-07 – 2015-01-08 (×11): 1 mg via INTRAVENOUS
  Filled 2015-01-07 (×11): qty 1

## 2015-01-07 MED ORDER — SENNOSIDES-DOCUSATE SODIUM 8.6-50 MG PO TABS
2.0000 | ORAL_TABLET | Freq: Every evening | ORAL | Status: DC | PRN
  Administered 2015-01-07 – 2015-01-10 (×3): 2 via ORAL
  Filled 2015-01-07 (×3): qty 2

## 2015-01-07 MED ORDER — PRENATAL MULTIVITAMIN CH
1.0000 | ORAL_TABLET | Freq: Every day | ORAL | Status: DC
  Administered 2015-01-07 – 2015-01-10 (×3): 1 via ORAL
  Filled 2015-01-07 (×3): qty 1

## 2015-01-07 NOTE — Plan of Care (Signed)
Problem: Phase I Progression Outcomes Goal: OOB as tolerated unless otherwise ordered Outcome: Completed/Met Date Met:  01/07/15 Tolerated walking to bathroom well    Goal: Initial discharge plan identified Outcome: Completed/Met Date Met:  01/07/15 VSS Infection resolving Pain controlled Understands self and f/u care Goal: Hemodynamically stable Outcome: Completed/Met Date Met:  01/07/15 Afebrile at this time. Goal: Other Phase I Outcomes/Goals Outcome: Completed/Met Date Met:  01/07/15 Voided gs amber urine and u/a sent to lab

## 2015-01-07 NOTE — Progress Notes (Signed)
Patient ID: Ana [name], female   DOB: 10-06-1990, 24 y.o.   MRN: [id number]   Subjective: Interval History: She reports pain is improving slightly.  Objective: Vital signs in last 24 hours: Temp:  [98.4 F (36.9 C)-99.6 F (37.6 C)] 98.7 F (37.1 C) (10/31 0510) Pulse Rate:  [71-108] 83 (10/31 0510) Resp:  [16-18] 18 (10/31 0510) BP: (112-150)/(58-93) 112/58 mmHg (10/31 0510) SpO2:  [100 %] 100 % (10/31 0510) Weight:  [155 lb (70.308 kg)] 155 lb (70.308 kg) (10/30 1721)  Intake/Output from previous day: 10/30 0701 - 10/31 0700 In: 1155.8 [P.O.:360; I.V.:745.8] Out: 300 [Urine:300] Intake/Output this shift:    General appearance: alert, cooperative and appears stated age Head: Normocephalic, without obvious abnormality, atraumatic Neck: supple, symmetrical, trachea midline Lungs: normal effort Heart: regular rate and rhythm Abdomen: soft, TTP in LLQ, no rebound Extremities: Homans sign is negative, no sign of DVT Neurologic: Grossly normal  Results for orders placed or performed during the hospital encounter of 01/06/15 (from the past 24 hour(s))  CBC with Differential     Status: Abnormal   Collection Time: 01/06/15  8:05 PM  Result Value Ref Range   WBC 11.2 (H) 4.0 - 10.5 K/uL   RBC 3.66 (L) 3.87 - 5.11 MIL/uL   Hemoglobin 10.0 (L) 12.0 - 15.0 g/dL   HCT 40.9 (L) 81.1 - 91.4 %   MCV 83.3 78.0 - 100.0 fL   MCH 27.3 26.0 - 34.0 pg   MCHC 32.8 30.0 - 36.0 g/dL   RDW 78.2 95.6 - 21.3 %   Platelets 270 150 - 400 K/uL   Neutrophils Relative % 81 %   Neutro Abs 9.0 (H) 1.7 - 7.7 K/uL   Lymphocytes Relative 13 %   Lymphs Abs 1.5 0.7 - 4.0 K/uL   Monocytes Relative 6 %   Monocytes Absolute 0.7 0.1 - 1.0 K/uL   Eosinophils Relative 0 %   Eosinophils Absolute 0.0 0.0 - 0.7 K/uL   Basophils Relative 0 %   Basophils Absolute 0.0 0.0 - 0.1 K/uL  Urinalysis, Routine w reflex microscopic (not at [facility])     Status: Abnormal   Collection Time: 01/07/15  1:10 AM  Result Value  Ref Range   Color, Urine YELLOW YELLOW   APPearance CLEAR CLEAR   Specific Gravity, Urine 1.020 1.005 - 1.030   pH 6.5 5.0 - 8.0   Glucose, UA NEGATIVE NEGATIVE mg/dL   Hgb urine dipstick NEGATIVE NEGATIVE   Bilirubin Urine SMALL (A) NEGATIVE   Ketones, ur 40 (A) NEGATIVE mg/dL   Protein, ur NEGATIVE NEGATIVE mg/dL   Urobilinogen, UA 4.0 (H) 0.0 - 1.0 mg/dL   Nitrite NEGATIVE NEGATIVE   Leukocytes, UA NEGATIVE NEGATIVE    Studies/Results: US Transvaginal Non-ob  01/06/2015  CLINICAL DATA:  Left flank pain, lower pelvic pain since October 29th, diagnosis of pelvic inflammatory diseaseand gonorrhea EXAM: TRANSABDOMINAL AND TRANSVAGINAL ULTRASOUND OF PELVIS TECHNIQUE: Both transabdominal and transvaginal ultrasound examinations of the pelvis were performed. Transabdominal technique was performed for global imaging of the pelvis including uterus, ovaries, adnexal regions, and pelvic cul-de-sac. It was necessary to proceed with endovaginal exam following the transabdominal exam to visualize the ovaries. COMPARISON:  12/19/2014 CT scan, 10/23/14 ultrasound FINDINGS: Uterus Measurements: 8 x 5 x 6 cm. No fibroids or other mass visualized. Endometrium Thickness: 4 mm.  No focal abnormality visualized. Right ovary Measurements: 42 x 17 x 16 mm. Normal appearance/no adnexal mass. Left ovary Measurements: 48 x 35 x 31 mm. The left  ovary is difficult to distinguish from more posterior heterogenous soft tissue. This adjacent area appears to contain vague small tubular areas with hypoechoic contents suggesting complex fluid. This tissue appears to be hypervascular. The patient is, by technologist's report, point-tender over this area. Other findings Small volume free fluid in the cul de sac. IMPRESSION: The findings suggest pelvic inflammatory disease and subtle pyosalpinx on the left. Electronically Signed   By: Esperanza Heiraymond  Rubner M.D.   On: 01/06/2015 21:20   [place] Pelvis Complete  01/06/2015  CLINICAL DATA:   Left flank pain, lower pelvic pain since October 29th, diagnosis of pelvic inflammatory diseaseand gonorrhea EXAM: TRANSABDOMINAL AND TRANSVAGINAL ULTRASOUND OF PELVIS TECHNIQUE: Both transabdominal and transvaginal ultrasound examinations of the pelvis were performed. Transabdominal technique was performed for global imaging of the pelvis including uterus, ovaries, adnexal regions, and pelvic cul-de-sac. It was necessary to proceed with endovaginal exam following the transabdominal exam to visualize the ovaries. COMPARISON:  12/19/2014 CT scan, 10/23/14 ultrasound FINDINGS: Uterus Measurements: 8 x 5 x 6 cm. No fibroids or other mass visualized. Endometrium Thickness: 4 mm.  No focal abnormality visualized. Right ovary Measurements: 42 x 17 x 16 mm. Normal appearance/no adnexal mass. Left ovary Measurements: 48 x 35 x 31 mm. The left ovary is difficult to distinguish from more posterior heterogenous soft tissue. This adjacent area appears to contain vague small tubular areas with hypoechoic contents suggesting complex fluid. This tissue appears to be hypervascular. The patient is, by technologist's report, point-tender over this area. Other findings Small volume free fluid in the cul de sac. IMPRESSION: The findings suggest pelvic inflammatory disease and subtle pyosalpinx on the left. Electronically Signed   By: Esperanza Heiraymond  Rubner M.D.   On: 01/06/2015 21:20   Ct Abdomen Pelvis W Contrast  12/19/2014  CLINICAL DATA:  24 year old female with acute abdominal and pelvic pain for 2 days. EXAM: CT ABDOMEN AND PELVIS WITH CONTRAST TECHNIQUE: Multidetector CT imaging of the abdomen and pelvis was performed using the standard protocol following bolus administration of intravenous contrast. CONTRAST:  100mL OMNIPAQUE IOHEXOL 300 MG/ML  SOLN COMPARISON:  None. FINDINGS: Lower chest:  Unremarkable Hepatobiliary: The liver and gallbladder are unremarkable. There is no evidence of biliary dilatation. Pancreas: Unremarkable  Spleen: Unremarkable Adrenals/Urinary Tract: The kidneys, adrenal glands and bladder are unremarkable. Stomach/Bowel: Unremarkable. There is no evidence of bowel obstruction or focal bowel wall thickening. The appendix is not identified but there is no evidence of enlarged tubular structure or inflammation in the pericecal region. Vascular/Lymphatic: Unremarkable. No enlarged lymph nodes or abdominal aortic aneurysm. Reproductive: No definite abnormalities. Other: Trace amount of free pelvic fluid is likely physiologic. There is no evidence of abscess or pneumoperitoneum. Musculoskeletal: No acute or suspicious abnormalities. IMPRESSION: No evidence of acute or significant abnormality. Electronically Signed   By: Harmon PierJeffrey  Hu M.D.   On: 12/19/2014 16:28    Scheduled Meds: . piperacillin-tazobactam  3.375 g Intravenous Q8H  . prenatal multivitamin  1 tablet Oral Q1200   Continuous Infusions: . dextrose 5% lactated ringers 125 mL/hr at 01/07/15 0650   PRN Meds:HYDROmorphone (DILAUDID) injection, ondansetron **OR** ondansetron (ZOFRAN) IV  Assessment/Plan: Principal Problem:   Pyosalpinx  Slow improvement on broad spectrum Abx. Continue until pain is improved with plan for 14 days of Doxycycline outpatient.   LOS: 0 days   Reva BoresPRATT,Keghan Mcfarren S, MD 01/07/2015 8:03 AM

## 2015-01-07 NOTE — H&P (Signed)
HPI Comments: Ana [name] is a 24 y.o. [id number] presenting with abdominal pain mostly in left suprapubic region and also left upper quadrant and left flank. It's been present since 12/19/2014 when she was [facility] with workup that included a CT of abdomen and pelvis which was normal. She had white count of 14,000. Her cervical culture came back positive for GC and she received ceftriaxone IV at that visit. She was given a prescription for Cipro for presumed UTI but never filled it. [facility]15 with dx PID and she filled the Rx for doxycycline 14 day course, starting yesterday. Urine culture neg 01/05/2015. Unsure if partner treated but states he's "messing around."  Abdominal Pain This is a recurrent problem. The current episode started 1 to 4 weeks ago. The onset quality is gradual. The problem occurs constantly (while awake, pain waxes and wanes). The most recent episode lasted 2 weeks. The problem has been gradually worsening. The pain is located in the LLQ, left flank and LUQ. The pain is at a severity of 7/10. The pain is moderate. The quality of the pain is sharp (stabbing). The abdominal pain does not radiate. Associated symptoms include dysuria and a fever. Pertinent negatives include no anorexia, constipation, diarrhea, hematuria, nausea or vomiting. The pain is aggravated by movement. The pain is relieved by nothing. Treatments tried: ibuprofen 400mg  yesterday and 200 mg today. The treatment provided mild relief.   Patient Active Problem List   Diagnosis Date Noted  . Pyosalpinx 01/07/2015  . Normal delivery 10/18/2014  . Intrauterine growth restriction affecting antepartum care of mother in third trimester 10/15/2014  . Chlamydia infection affecting pregnancy in third trimester, antepartum 10/13/2014  . UTI (urinary tract infection) in pregnancy in third trimester 09/13/2014  . Abnormal antenatal AFP screen 06/25/2014  . Supervision of high risk pregnancy, antepartum  05/22/2014  . Asthma, mild intermittent 05/22/2014  . History of preterm PROM in previous pregnancy, currently pregnant 05/15/2014  . Maternal tobacco use, antepartum 05/15/2014    Prenatal History/Complications:  Past Medical History: Past Medical History  Diagnosis Date  . Chlamydia Jan 2014  . Abnormal Pap smear   . Asthma   . Kidney infection     Past Surgical History: Past Surgical History  Procedure Laterality Date  . Wisdom tooth extraction      Obstetrical History: OB History    Gravida Para Term Preterm AB TAB SAB Ectopic Multiple Living   6 5 5  0 1 0 1 0 0 5      Gynecological History: OB History    Gravida Para Term Preterm AB TAB SAB Ectopic Multiple Living   6 5 5  0 1 0 1 0 0 5      Social History: Social History   Social History  . Marital Status: Single    Spouse Name: N/A  . Number of Children: N/A  . Years of Education: N/A   Social History Main Topics  . Smoking status: Former Smoker    Quit date: 04/25/2014  . Smokeless tobacco: Never Used  . Alcohol Use: No     Comment: occasional/not since pregnancy  . Drug Use: No     Comment: stopped 04/09/12 after she found out she was pregnant  . Sexual Activity: Yes    Birth Control/ Protection: None   Other Topics Concern  . None   Social History Narrative    Family History: Family History  Problem Relation Age of Onset  . Hypertension Mother   .  Hypertension Maternal Grandmother     Allergies: No Known Allergies  Prescriptions prior to admission  Medication Sig Dispense Refill Last Dose  . ciprofloxacin (CIPRO) 500 MG tablet Take 1 tablet (500 mg total) by mouth 2 (two) times daily. 28 tablet 0 01/04/2015 at Unknown time  . doxycycline (VIBRA-TABS) 100 MG tablet Take 1 tablet (100 mg total) by mouth 2 (two) times daily. 28 tablet 0 01/06/2015 at Unknown time  . ibuprofen (ADVIL,MOTRIN) 200 MG tablet Take 400 mg by mouth every 6 (six) hours as needed for moderate pain.   01/06/2015  at Unknown time  . ondansetron (ZOFRAN) 4 MG tablet Take 1 tablet (4 mg total) by mouth every 6 (six) hours. 12 tablet 0 01/05/2015 at Unknown time  . oxyCODONE-acetaminophen (PERCOCET/ROXICET) 5-325 MG per tablet Take 1-2 tablets by mouth every 6 (six) hours as needed. 30 tablet 0 Past Week at Unknown time    Review of Systems - History obtained from the patient General ROS: negative Psychological ROS: negative Respiratory ROS: no cough, shortness of breath, or wheezing Cardiovascular ROS: no chest pain or dyspnea on exertion Gastrointestinal ROS: positive for - abdominal pain and nausea/vomiting Genito-Urinary ROS: positive for - vaginal discharge  Blood pressure 138/68, pulse 76, temperature 98.4 F (36.9 C), temperature source Oral, resp. rate 16, last menstrual period 12/30/2014, currently breastfeeding. Physical Exam  Constitutional: She appears well-developed and well-nourished. No distress.  HENT:  Head: Normocephalic and atraumatic.  Cardiovascular: Normal rate and regular rhythm.   No murmur heard. Pulmonary/Chest: Effort normal and breath sounds normal. No respiratory distress. She has no wheezes.  Abdominal: Soft. Bowel sounds are normal. She exhibits no distension. There is tenderness. There is guarding.  Skin: Skin is warm and dry. She is not diaphoretic.  Psychiatric: She has a normal mood and affect. Her behavior is normal. Judgment and thought content normal.     Assessment: 1. PID (acute pelvic inflammatory disease)   2. Gonococcal PID, female   3. Left pyosalpinx    Pt given IV rocephin, phenergan, & dilaudid in [facility]. Pain returned & pt requesting to stay for continuation of IV antibiotics & pain control.   Plan:  Admit to Women's Unit for observation IV antibiotics, pain medication, & antiemetics  Judeth Horn, NP 01/07/2015, 12:33 AM

## 2015-01-08 DIAGNOSIS — N730 Acute parametritis and pelvic cellulitis: Secondary | ICD-10-CM

## 2015-01-08 DIAGNOSIS — N7093 Salpingitis and oophoritis, unspecified: Secondary | ICD-10-CM

## 2015-01-08 MED ORDER — OXYCODONE-ACETAMINOPHEN 5-325 MG PO TABS
2.0000 | ORAL_TABLET | ORAL | Status: DC | PRN
  Administered 2015-01-08 – 2015-01-09 (×5): 2 via ORAL
  Filled 2015-01-08 (×6): qty 2

## 2015-01-08 MED ORDER — HYDROMORPHONE HCL 1 MG/ML IJ SOLN: 0.5000 mg | INTRAMUSCULAR | Status: DC | PRN

## 2015-01-08 MED ORDER — IBUPROFEN 600 MG PO TABS
600.0000 mg | ORAL_TABLET | Freq: Four times a day (QID) | ORAL | Status: DC
  Administered 2015-01-08 – 2015-01-10 (×9): 600 mg via ORAL
  Filled 2015-01-08 (×10): qty 1

## 2015-01-08 NOTE — Progress Notes (Signed)
Subjective: Patient reports continued left-sided pelvic pain that is worse with movement and improved with rest. She reports that her pain has not really improved since being here, despite antibiotics.  She denies fevers, chills, however she complains of nausea. No emesis  Objective: I have reviewed patient's vital signs, medications and labs.  BP 111/50 mmHg  Pulse 57  Temp(Src) 98.3 F (36.8 C) (Oral)  Resp 20  Ht 5\' 4"  (1.626 m)  Wt 155 lb (70.308 kg)  BMI 26.59 kg/m2  SpO2 100%  LMP 12/30/2014  General: alert, cooperative and no distress Resp: clear to auscultation bilaterally Cardio: regular rate and rhythm, S1, S2 normal, no murmur, click, rub or gallop GI: soft, non-tender; bowel sounds normal; no masses,  no organomegaly Extremities: extremities normal, atraumatic, no cyanosis or edema  Results for orders placed or performed during the hospital encounter of 01/06/15 (from the past 48 hour(s))  CBC with Differential     Status: Abnormal   Collection Time: 01/06/15  8:05 PM  Result Value Ref Range   WBC 11.2 (H) 4.0 - 10.5 K/uL   RBC 3.66 (L) 3.87 - 5.11 MIL/uL   Hemoglobin 10.0 (L) 12.0 - 15.0 g/dL   HCT 16.130.5 (L) 09.636.0 - 04.546.0 %   MCV 83.3 78.0 - 100.0 fL   MCH 27.3 26.0 - 34.0 pg   MCHC 32.8 30.0 - 36.0 g/dL   RDW 40.914.4 81.111.5 - 91.415.5 %   Platelets 270 150 - 400 K/uL   Neutrophils Relative % 81 %   Neutro Abs 9.0 (H) 1.7 - 7.7 K/uL   Lymphocytes Relative 13 %   Lymphs Abs 1.5 0.7 - 4.0 K/uL   Monocytes Relative 6 %   Monocytes Absolute 0.7 0.1 - 1.0 K/uL   Eosinophils Relative 0 %   Eosinophils Absolute 0.0 0.0 - 0.7 K/uL   Basophils Relative 0 %   Basophils Absolute 0.0 0.0 - 0.1 K/uL  Urinalysis, Routine w reflex microscopic (not at [facility])     Status: Abnormal   Collection Time: 01/07/15  1:10 AM  Result Value Ref Range   Color, Urine YELLOW YELLOW   APPearance CLEAR CLEAR   Specific Gravity, Urine 1.020 1.005 - 1.030   pH 6.5 5.0 - 8.0   Glucose, UA NEGATIVE  NEGATIVE mg/dL   Hgb urine dipstick NEGATIVE NEGATIVE   Bilirubin Urine SMALL (A) NEGATIVE   Ketones, ur 40 (A) NEGATIVE mg/dL   Protein, ur NEGATIVE NEGATIVE mg/dL   Urobilinogen, UA 4.0 (H) 0.0 - 1.0 mg/dL   Nitrite NEGATIVE NEGATIVE   Leukocytes, UA NEGATIVE NEGATIVE    Comment: MICROSCOPIC NOT DONE ON URINES WITH NEGATIVE PROTEIN, BLOOD, LEUKOCYTES, NITRITE, OR GLUCOSE <1000 mg/dL.     Assessment/Plan: #1 gonococcal PID #2 left pyosalpinx  Continue Zosyn  Change pain medicine to oral  Discontinue IV fluids  Discharge and pain controlled for 14 days of doxycycline outpatient    LOS: 1 day    STINSON, JACOB JEHIEL 01/08/2015, 7:50 AM

## 2015-01-08 NOTE — Progress Notes (Signed)
I received a consult for pt from her nurse, Salena SanerJulie Potts.  Pt did not wish to talk at this time, but remembered me from when she had a perinatal loss last year.  She asked for my card so that when she felt up to it, she could follow up.  I left my card with her and let her know that we are available for follow up at any time.  Chaplain Dyanne CarrelKaty Latalia Etzler, Bcc Pager, [phone] 3:59 PM    01/08/15 1500  Clinical Encounter Type  Visited With Patient  Visit Type Initial

## 2015-01-09 ENCOUNTER — Inpatient Hospital Stay (HOSPITAL_COMMUNITY): Payer: Medicaid Other

## 2015-01-09 LAB — URINE MICROSCOPIC-ADD ON

## 2015-01-09 LAB — CBC
HCT: 27.9 % — ABNORMAL LOW (ref 36.0–46.0)
Hemoglobin: 9 g/dL — ABNORMAL LOW (ref 12.0–15.0)
MCH: 27.4 pg (ref 26.0–34.0)
MCHC: 32.3 g/dL (ref 30.0–36.0)
MCV: 84.8 fL (ref 78.0–100.0)
PLATELETS: 233 10*3/uL (ref 150–400)
RBC: 3.29 MIL/uL — ABNORMAL LOW (ref 3.87–5.11)
RDW: 14.4 % (ref 11.5–15.5)
WBC: 4.5 10*3/uL (ref 4.0–10.5)

## 2015-01-09 LAB — URINALYSIS, ROUTINE W REFLEX MICROSCOPIC
BILIRUBIN URINE: NEGATIVE
Glucose, UA: NEGATIVE mg/dL
KETONES UR: NEGATIVE mg/dL
Nitrite: NEGATIVE
PROTEIN: 30 mg/dL — AB
Specific Gravity, Urine: 1.025 (ref 1.005–1.030)
UROBILINOGEN UA: 1 mg/dL (ref 0.0–1.0)
pH: 6.5 (ref 5.0–8.0)

## 2015-01-09 MED ORDER — ONDANSETRON HCL 4 MG PO TABS
4.0000 mg | ORAL_TABLET | Freq: Four times a day (QID) | ORAL | Status: DC | PRN
  Administered 2015-01-09 – 2015-01-10 (×4): 4 mg via ORAL
  Filled 2015-01-09 (×4): qty 1

## 2015-01-09 MED ORDER — IOHEXOL 300 MG/ML  SOLN
100.0000 mL | Freq: Once | INTRAMUSCULAR | Status: AC | PRN
  Administered 2015-01-09: 100 mL via INTRAVENOUS

## 2015-01-09 MED ORDER — DOXYCYCLINE HYCLATE 100 MG PO TABS
100.0000 mg | ORAL_TABLET | Freq: Two times a day (BID) | ORAL | Status: DC
  Administered 2015-01-09 – 2015-01-10 (×2): 100 mg via ORAL
  Filled 2015-01-09 (×2): qty 1

## 2015-01-09 MED ORDER — OXYCODONE HCL 5 MG PO TABS
5.0000 mg | ORAL_TABLET | ORAL | Status: DC | PRN
  Administered 2015-01-09 – 2015-01-10 (×3): 5 mg via ORAL
  Filled 2015-01-09 (×3): qty 1

## 2015-01-09 MED ORDER — ONDANSETRON HCL 4 MG/2ML IJ SOLN
4.0000 mg | Freq: Four times a day (QID) | INTRAMUSCULAR | Status: DC | PRN
  Administered 2015-01-09: 4 mg via INTRAVENOUS
  Filled 2015-01-09: qty 2

## 2015-01-09 MED ORDER — OXYCODONE HCL 5 MG PO TABS: 10.0000 mg | ORAL_TABLET | ORAL | Status: DC | PRN

## 2015-01-09 MED ORDER — OXYCODONE HCL 5 MG PO TABS
15.0000 mg | ORAL_TABLET | ORAL | Status: DC | PRN
  Administered 2015-01-09 – 2015-01-10 (×5): 15 mg via ORAL
  Filled 2015-01-09 (×5): qty 3

## 2015-01-09 MED ORDER — OXYCODONE HCL 5 MG PO TABS
20.0000 mg | ORAL_TABLET | Freq: Once | ORAL | Status: AC | PRN
  Administered 2015-01-09: 20 mg via ORAL
  Filled 2015-01-09: qty 4

## 2015-01-09 MED ORDER — METRONIDAZOLE 500 MG PO TABS
500.0000 mg | ORAL_TABLET | Freq: Two times a day (BID) | ORAL | Status: DC
  Administered 2015-01-09 – 2015-01-10 (×2): 500 mg via ORAL
  Filled 2015-01-09 (×2): qty 1

## 2015-01-09 MED ORDER — OXYCODONE-ACETAMINOPHEN 5-325 MG PO TABS
2.0000 | ORAL_TABLET | ORAL | Status: DC | PRN
  Administered 2015-01-09: 2 via ORAL
  Filled 2015-01-09: qty 2

## 2015-01-09 MED ORDER — PROMETHAZINE HCL 25 MG PO TABS
25.0000 mg | ORAL_TABLET | ORAL | Status: DC | PRN
  Administered 2015-01-09: 25 mg via ORAL
  Filled 2015-01-09 (×2): qty 1

## 2015-01-09 MED ORDER — HYDROMORPHONE HCL 2 MG PO TABS
2.0000 mg | ORAL_TABLET | Freq: Once | ORAL | Status: AC
  Administered 2015-01-09: 2 mg via ORAL
  Filled 2015-01-09: qty 1

## 2015-01-09 NOTE — Progress Notes (Signed)
Spoke with patient twice overnight, first around 0220 then at 620, regarding pain. Patient stated that she was in pain all day and had asked to speak with someone but no one came to see her. She refused ibuprofen earlier in the evening saying she was afraid it would hurt her kidneys and that she had been told she had a kidney infection. She said her pain ranged from a 9 or 10 out of 10. On abdominal exam, she stated she had tenderness in her left lower quadrant. However, patient did not guard or wince upon examination. Offered to change her zofran to phenergan, as this may increase effect of percocet. She stated she had percocet 10, phenergan and zofran at home but that she would try this even though she did not think it would work. Checked back in with patient at 620. She stated all the phenergan did was make her sleepy.   S: Patient still having pain at a 9 out of 10 in her lower abdomen. Complains of difficulty taking multiple pills and adding phenergan PO made her more nauseated.  O:   Blood pressure 123/72, pulse 48, temperature 98.6 F (37 C), temperature source Oral, resp. rate 16, height 5\' 4"  (1.626 m), weight 70.308 kg (155 lb), last menstrual period 12/30/2014, SpO2 100 %, currently breastfeeding.  Abdomen: Point tenderness at left lower abdomen without rebound or guarding.  A: Patient continues to endorse abdominal pain that is not helped by percocet. She remains afebrile. Exam does not appear consistent with level of pain expressed.   P: Will assess patient for improvement with: -Trial of 2 mg dilaudid for 1 dose and follow-up for improvement in pain control. -CBC to assess for leukocytosis. -UA to confirm resolution of UTI. -Transvaginal U/S to assess status of pyosalpinx.  Patient still on IV zosyn. Consider transitioning to PO, pending lab and imaging results.   Dani GobbleHillary Fitzgerald, MD [facility], PGY-1

## 2015-01-09 NOTE — Progress Notes (Signed)
Patient complaining of 10 out of 10 pain with no relief from current medication regime.  Patient restless, anxious, and verbalizes dissatisfaction with pain control.  Patient request to see physician.  Resident, Sampson GoonFitzgerald, from faculty practice called.  Resident will come see patient regarding pain control.  Plan of care reported to patient.

## 2015-01-10 ENCOUNTER — Telehealth: Payer: Self-pay | Admitting: *Deleted

## 2015-01-10 ENCOUNTER — Encounter: Payer: Self-pay | Admitting: Obstetrics and Gynecology

## 2015-01-10 DIAGNOSIS — A5424 Gonococcal female pelvic inflammatory disease: Secondary | ICD-10-CM

## 2015-01-10 DIAGNOSIS — Z87891 Personal history of nicotine dependence: Secondary | ICD-10-CM

## 2015-01-10 MED ORDER — OXYCODONE HCL 15 MG PO TABS: 15.0000 mg | ORAL_TABLET | Freq: Four times a day (QID) | ORAL | Status: DC | PRN

## 2015-01-10 MED ORDER — METRONIDAZOLE 500 MG PO TABS: 500.0000 mg | ORAL_TABLET | Freq: Two times a day (BID) | ORAL | Status: DC

## 2015-01-10 MED ORDER — DOXYCYCLINE HYCLATE 100 MG PO TABS: 100.0000 mg | ORAL_TABLET | Freq: Two times a day (BID) | ORAL | Status: DC

## 2015-01-10 MED ORDER — SENNOSIDES-DOCUSATE SODIUM 8.6-50 MG PO TABS: 2.0000 | ORAL_TABLET | Freq: Every evening | ORAL | Status: DC | PRN

## 2015-01-10 MED ORDER — OXYCODONE HCL 5 MG PO TABS: 5.0000 mg | ORAL_TABLET | ORAL | Status: DC | PRN

## 2015-01-10 NOTE — Telephone Encounter (Addendum)
[facility] [address], [place], [place] states pt there now to get both RX's for Oxycodone filled that Dr. Emelda FearFerguson prescribed on 01/10/2015. Vonna KotykJay  States he did not know if Dr. Emelda FearFerguson was aware the pt also received Percocet 10/325 mg # 30 from a Dr. Ronne BinningMcKenzie on 12/27/2014. Does Dr. Emelda FearFerguson want them to fill both RX at this time?  Informed Vonna KotykJay Dr. Emelda FearFerguson out of office will notify his nurse.   Per Dr. Emelda FearFerguson can fill the Oxycodone 15 mg prescription but not the Oxycodone 5 mg.

## 2015-01-10 NOTE — Discharge Summary (Signed)
Physician Discharge Summary  Patient ID: Ana Cabalyshai Grandfield MRN: [id number] DOB/AGE: 09/21/1990 24 y.o.  Admit date: 01/06/2015 Discharge date: 01/10/2015  Admission Diagnoses: Left pyosalpinx/PID  Discharge Diagnoses: Left pyosalpinx/PID, improving Principal Problem:   Pyosalpinx Active Problems:   PID (acute pelvic inflammatory disease)   Discharged Condition: fair  Hospital Course: HPI Comments: Ana [name] is a 24 y.o. [id number] presenting with abdominal pain mostly in left suprapubic region and also left upper quadrant and left flank. It's been present since 12/19/2014 when [facility] with workup that included a CT of abdomen and pelvis which was normal. She had white count of 14,000. Her cervical culture came back positive for GC and she received ceftriaxone IV at that visit. She was given a prescription for Cipro for presumed UTI but never filled it. [facility] with dx PID and she filled the Rx for doxycycline 14 day course, starting yesterday. Urine culture neg 01/05/2015. Unsure if partner treated but states he's "messing around."  Abdominal Pain This is a recurrent problem. The current episode started 1 to 4 weeks ago. The onset quality is gradual. The problem occurs constantly (while awake, pain waxes and wanes). The most recent episode lasted 2 weeks. The problem has been gradually worsening. The pain is located in the LLQ, left flank and LUQ. The pain is at a severity of 7/10. The pain is moderate. The quality of the pain is sharp (stabbing). The abdominal pain does not radiate. Associated symptoms include dysuria and a fever. Pertinent negatives include no anorexia, constipation, diarrhea, hematuria, nausea or vomiting. The pain is aggravated by movement. The pain is relieved by nothing. Treatments tried: ibuprofen 400mg  yesterday and 200 mg today. The treatment provided mild relief.  Of note the patient was [facility] to [facility] but  when there was a 2 hour anticipated wait, she left the emergency room, went across the street, called 911 and [facility].  Consults: Interventional radiology for consideration of IR drainage of left adnexal mass, decided against  Significant Diagnostic Studies: labs: Positive GC culture 12/19/2014 ultrasound showing an enlarged left ovary with questionable complex fluid surrounding it, on day of admission. Repeat ultrasound 1 day prior to discharge showed a small clear pocket of fluid adjacent to a 5 cm ovary. CT scan was performed to see if there was an area that could be drained by interventional radiology assistants, but there was no discrete loculation of fluid amenable to interventional radiology draining. Treatments: IV hydration and antibiotics: ceftriaxone, metronidazole and doxycycline in hospital and as outpatient  Discharge Exam: Blood pressure 122/70, pulse 50, temperature 98 F (36.7 C), temperature source Oral, resp. rate 18, height 5\' 4"  (1.626 m), weight 155 lb (70.308 kg), last menstrual period 12/30/2014, SpO2 100 %, currently breastfeeding. General appearance: alert, cooperative and no distress GI: soft, non-tender; bowel sounds normal; no masses,  no organomegaly and A small area of guarding just inside the anterior superior left iliac crest this could be attributable to the left ovarian cyst and adjacent adhesions from her pelvic inflammatory disease. His fat not felt to be due to kidney stone or worsening condition  Disposition: 01-Home or Self Care of interest the patient has a very high demand for analgesics. During her hospitalization she will ended up on oxycodone IR 15 mg, to 20 mg every 6 hours. She would still have impatient demands of nursing staff and her complaints of pain seemed to exceed  the objective observations of examiners  Discharge Instructions    Call MD for:  persistant nausea and vomiting    Complete by:  As directed    Call gyn clinic at [phone]     Call MD for:  temperature >100.4    Complete by:  As directed      Diet - low sodium heart healthy    Complete by:  As directed      Discharge instructions    Complete by:  As directed   Continue Oxycodone 15 mg 4 times daily for pain. Do NOT get ahead of schedule as this must last 2 weeks. There are a limited number of breakthru pills for the Rare episode of breakthru pain . You may use your ibuprofen to assist you with most breakthru pain episodes     Increase activity slowly    Complete by:  As directed      Sexual Activity Restrictions    Complete by:  As directed   None til seen back in the clinic in 2 wk.            Medication List    STOP taking these medications        ciprofloxacin 500 MG tablet  Commonly known as:  CIPRO     oxyCODONE-acetaminophen 5-325 MG tablet  Commonly known as:  PERCOCET/ROXICET      TAKE these medications        doxycycline 100 MG tablet  Commonly known as:  VIBRA-TABS  Take 1 tablet (100 mg total) by mouth 2 (two) times daily.     ibuprofen 200 MG tablet  Commonly known as:  ADVIL,MOTRIN  Take 400 mg by mouth every 6 (six) hours as needed for moderate pain.     metroNIDAZOLE 500 MG tablet  Commonly known as:  FLAGYL  Take 1 tablet (500 mg total) by mouth every 12 (twelve) hours.     ondansetron 4 MG tablet  Commonly known as:  ZOFRAN  Take 1 tablet (4 mg total) by mouth every 6 (six) hours.     oxyCODONE 15 MG immediate release tablet  Commonly known as:  ROXICODONE  Take 1 tablet (15 mg total) by mouth every 6 (six) hours as needed for severe pain.     oxyCODONE 5 MG immediate release tablet  Commonly known as:  Oxy IR/ROXICODONE  Take 1 tablet (5 mg total) by mouth every 3 (three) hours as needed for breakthrough pain (not relieved  dose).     senna-docusate 8.6-50 MG tablet  Commonly known as:  Senokot-S  Take 2 tablets by mouth at bedtime as needed for mild constipation or moderate  constipation.         SignedTilda Burrow 01/10/2015, 8:34 AM

## 2015-01-10 NOTE — Progress Notes (Addendum)
Subjective:pt desires to go home, she is voiding , tolerating the LLQ pain with the oxycodone RI dosing we have her on. Denies vomiting or diarrhea/ Pt is currently on oxycodone IR 15-20 mg q 6h. Patient reports tolerating PO and no problems voiding.    Objective: I have reviewed patient's vital signs, intake and output, medications, labs and radiology results. Pt claims the pain remains a 6 but that she sleeps thru it. General: alert, cooperative and no distress GI: soft, non-tender; bowel sounds normal; no masses,  no organomegaly and guards slightly in llq, no rebound. Extremities: extremities normal, atraumatic, no cyanosis or edema and Homans sign is negative, no sign of DVT CT": No descrete area of abscess that would be amenable to drainage  Assessment/Plan: Improving left adnexal infection, s/p + GC PID Stable for outpt management Excessive medication dependency, Plan d/c on doxycycline and Flagyl x 2 wk for recheck 2 wk gyn clinic.   LOS: 3 days    Mariapaula Krist V 01/10/2015, 7:45 AM   Addendum: pt wants 2 notes documenting that she's been in the hospital, will provide. Pt wants a medicine to coat her stomach, given the name of OTC gaviscon tablets, pt declines Pt reports nausea pills make her nauseous, but declines suppository phenergan

## 2015-01-10 NOTE — Progress Notes (Signed)
Pt ambulated out with significant other  Teaching complete  Scripts and letter given to pt

## 2015-01-30 ENCOUNTER — Encounter: Payer: Medicaid Other | Admitting: Family Medicine

## 2015-01-30 ENCOUNTER — Telehealth: Payer: Self-pay | Admitting: *Deleted

## 2015-01-30 ENCOUNTER — Encounter: Payer: Self-pay | Admitting: *Deleted

## 2015-01-30 NOTE — Telephone Encounter (Signed)
Ana [name] missed an appointment for follow up after hospitilization for PID. Called home and mobile numbers and left a message notifying her she missed an appointment and needs to reschedule. Will also send letter.

## 2015-06-03 ENCOUNTER — Encounter (HOSPITAL_COMMUNITY): Payer: Self-pay | Admitting: *Deleted

## 2015-06-03 ENCOUNTER — Inpatient Hospital Stay (HOSPITAL_COMMUNITY): Payer: Medicaid Other

## 2015-06-03 ENCOUNTER — Inpatient Hospital Stay (HOSPITAL_COMMUNITY)
Admission: AD | Admit: 2015-06-03 | Discharge: 2015-06-03 | Disposition: A | Discharge status: Home / Self Care | Payer: Medicaid Other | Source: Ambulatory Visit | Attending: Family Medicine | Admitting: Family Medicine

## 2015-06-03 DIAGNOSIS — Z87891 Personal history of nicotine dependence: Secondary | ICD-10-CM | POA: Insufficient documentation

## 2015-06-03 DIAGNOSIS — R102 Pelvic and perineal pain: Secondary | ICD-10-CM | POA: Insufficient documentation

## 2015-06-03 DIAGNOSIS — O26891 Other specified pregnancy related conditions, first trimester: Secondary | ICD-10-CM

## 2015-06-03 DIAGNOSIS — O200 Threatened abortion: Secondary | ICD-10-CM | POA: Diagnosis not present

## 2015-06-03 LAB — URINALYSIS, ROUTINE W REFLEX MICROSCOPIC
GLUCOSE, UA: NEGATIVE mg/dL
KETONES UR: 15 mg/dL — AB
LEUKOCYTES UA: NEGATIVE
Nitrite: NEGATIVE
PH: 6 (ref 5.0–8.0)
Protein, ur: NEGATIVE mg/dL
Specific Gravity, Urine: 1.025 (ref 1.005–1.030)

## 2015-06-03 LAB — HCG, QUANTITATIVE, PREGNANCY: HCG, BETA CHAIN, QUANT, S: 8184 m[IU]/mL — AB (ref <5)

## 2015-06-03 LAB — URINE MICROSCOPIC-ADD ON

## 2015-06-03 LAB — WET PREP, GENITAL
SPERM: NONE SEEN
TRICH WET PREP: NONE SEEN
Yeast Wet Prep HPF POC: NONE SEEN

## 2015-06-03 LAB — CBC
HCT: 31.9 % — ABNORMAL LOW (ref 36.0–46.0)
Hemoglobin: 10.7 g/dL — ABNORMAL LOW (ref 12.0–15.0)
MCH: 28.4 pg (ref 26.0–34.0)
MCHC: 33.5 g/dL (ref 30.0–36.0)
MCV: 84.6 fL (ref 78.0–100.0)
PLATELETS: 223 10*3/uL (ref 150–400)
RBC: 3.77 MIL/uL — ABNORMAL LOW (ref 3.87–5.11)
RDW: 13.9 % (ref 11.5–15.5)
WBC: 8 10*3/uL (ref 4.0–10.5)

## 2015-06-03 LAB — GC/CHLAMYDIA PROBE AMP (~~LOC~~) NOT AT ARMC
CHLAMYDIA, DNA PROBE: NEGATIVE
Neisseria Gonorrhea: NEGATIVE

## 2015-06-03 LAB — RPR: RPR Ser Ql: NONREACTIVE

## 2015-06-03 LAB — HIV ANTIBODY (ROUTINE TESTING W REFLEX): HIV Screen 4th Generation wRfx: NONREACTIVE

## 2015-06-03 LAB — POCT PREGNANCY, URINE: Preg Test, Ur: POSITIVE — AB

## 2015-06-03 MED ORDER — HYDROCODONE-ACETAMINOPHEN 5-325 MG PO TABS
2.0000 | ORAL_TABLET | Freq: Once | ORAL | Status: AC
  Administered 2015-06-03: 2 via ORAL
  Filled 2015-06-03: qty 2

## 2015-06-03 MED ORDER — IBUPROFEN 800 MG PO TABS: 800.0000 mg | ORAL_TABLET | Freq: Three times a day (TID) | ORAL | Status: DC | PRN

## 2015-06-03 NOTE — MAU Provider Note (Signed)
History     CSN: [id number]  Arrival date and time: 06/03/15 [id number]   First Provider Initiated Contact with Patient 06/03/15 0127      Chief Complaint  Patient presents with  . Vaginal Bleeding   Vaginal Bleeding The patient's primary symptoms include pelvic pain and vaginal bleeding. This is a new problem. The current episode started yesterday. The problem occurs intermittently. The problem has been unchanged. Pain severity now: 8/10  The problem affects both sides. She is pregnant. Associated symptoms include abdominal pain, headaches and nausea. Pertinent negatives include no chills, constipation, diarrhea, dysuria, fever, frequency, urgency or vomiting. The vaginal bleeding is spotting (when wiping x 2 today ). She has not been passing clots. She has not been passing tissue. Nothing aggravates the symptoms. She has tried NSAIDs for the symptoms. The treatment provided significant relief. She is sexually active. It is unknown whether or not her partner has an STD. Her menstrual history has been regular (LMP: 03/11/2015 ).     Past Medical History  Diagnosis Date  . Chlamydia Jan 2014  . Abnormal Pap smear   . Asthma   . Kidney infection     Past Surgical History  Procedure Laterality Date  . Wisdom tooth extraction      Family History  Problem Relation Age of Onset  . Hypertension Mother   . Hypertension Maternal Grandmother     Social History  Substance Use Topics  . Smoking status: Former Smoker    Quit date: 04/25/2014  . Smokeless tobacco: Never Used  . Alcohol Use: No     Comment: occasional/not since pregnancy    Allergies: No Known Allergies  Prescriptions prior to admission  Medication Sig Dispense Refill Last Dose  . ibuprofen (ADVIL,MOTRIN) 200 MG tablet Take 400 mg by mouth every 6 (six) hours as needed for moderate pain.   06/02/2015 at Unknown time  . doxycycline (VIBRA-TABS) 100 MG tablet Take 1 tablet (100 mg total) by mouth 2 (two) times daily.  (Patient not taking: Reported on 06/03/2015) 28 tablet 0   . metroNIDAZOLE (FLAGYL) 500 MG tablet Take 1 tablet (500 mg total) by mouth every 12 (twelve) hours. (Patient not taking: Reported on 06/03/2015) 28 tablet 0   . ondansetron (ZOFRAN) 4 MG tablet Take 1 tablet (4 mg total) by mouth every 6 (six) hours. (Patient not taking: Reported on 06/03/2015) 12 tablet 0 01/05/2015 at Unknown time  . oxyCODONE (OXY IR/ROXICODONE) 5 MG immediate release tablet Take 1 tablet (5 mg total) by mouth every 3 (three) hours as needed for breakthrough pain (not relieved 15mg  dose). (Patient not taking: Reported on 06/03/2015) 10 tablet 0   . oxyCODONE (ROXICODONE) 15 MG immediate release tablet Take 1 tablet (15 mg total) by mouth every 6 (six) hours as needed for severe pain. (Patient not taking: Reported on 06/03/2015) 56 tablet 0   . senna-docusate (SENOKOT-S) 8.6-50 MG tablet Take 2 tablets by mouth at bedtime as needed for mild constipation or moderate constipation. (Patient not taking: Reported on 06/03/2015) 30 tablet 1     Review of Systems  Constitutional: Negative for fever and chills.  Gastrointestinal: Positive for nausea and abdominal pain. Negative for vomiting, diarrhea and constipation.  Genitourinary: Positive for vaginal bleeding and pelvic pain. Negative for dysuria, urgency and frequency.  Neurological: Positive for headaches.   Physical Exam   Last menstrual period 03/11/2015, currently breastfeeding.  Physical Exam  Nursing note and vitals reviewed. Constitutional: She is oriented to person, place, and  time. She appears well-developed and well-nourished. No distress.  HENT:  Head: Normocephalic.  Cardiovascular: Normal rate.   Respiratory: Effort normal.  GI: Soft. There is no tenderness.  Musculoskeletal: Normal range of motion.  Neurological: She is alert and oriented to person, place, and time.  Skin: Skin is warm and dry.  Psychiatric: She has a normal mood and affect.   Results  for orders placed or performed during the hospital encounter of 06/03/15 (from the past 24 hour(s))  Urinalysis, Routine w reflex microscopic (not at [facility])     Status: Abnormal   Collection Time: 06/03/15  1:00 AM  Result Value Ref Range   Color, Urine YELLOW YELLOW   APPearance CLEAR CLEAR   Specific Gravity, Urine 1.025 1.005 - 1.030   pH 6.0 5.0 - 8.0   Glucose, UA NEGATIVE NEGATIVE mg/dL   Hgb urine dipstick MODERATE (A) NEGATIVE   Bilirubin Urine SMALL (A) NEGATIVE   Ketones, ur 15 (A) NEGATIVE mg/dL   Protein, ur NEGATIVE NEGATIVE mg/dL   Nitrite NEGATIVE NEGATIVE   Leukocytes, UA NEGATIVE NEGATIVE  Urine microscopic-add on     Status: Abnormal   Collection Time: 06/03/15  1:00 AM  Result Value Ref Range   Squamous Epithelial / LPF 0-5 (A) NONE SEEN   WBC, UA 0-5 0 - 5 WBC/hpf   RBC / HPF 0-5 0 - 5 RBC/hpf   Bacteria, UA FEW (A) NONE SEEN   Urine-Other MUCOUS PRESENT   Pregnancy, urine POC     Status: Abnormal   Collection Time: 06/03/15  1:09 AM  Result Value Ref Range   Preg Test, Ur POSITIVE (A) NEGATIVE  Wet prep, genital     Status: Abnormal   Collection Time: 06/03/15  1:20 AM  Result Value Ref Range   Yeast Wet Prep HPF POC NONE SEEN NONE SEEN   Trich, Wet Prep NONE SEEN NONE SEEN   Clue Cells Wet Prep HPF POC PRESENT (A) NONE SEEN   WBC, Wet Prep HPF POC FEW (A) NONE SEEN   Sperm NONE SEEN   CBC     Status: Abnormal   Collection Time: 06/03/15  1:27 AM  Result Value Ref Range   WBC 8.0 4.0 - 10.5 K/uL   RBC 3.77 (L) 3.87 - 5.11 MIL/uL   Hemoglobin 10.7 (L) 12.0 - 15.0 g/dL   HCT 16.1 (L) 09.6 - 04.5 %   MCV 84.6 78.0 - 100.0 fL   MCH 28.4 26.0 - 34.0 pg   MCHC 33.5 30.0 - 36.0 g/dL   RDW 40.9 81.1 - 91.4 %   Platelets 223 150 - 400 K/uL   US Ob Comp Less 14 Wks  06/03/2015  CLINICAL DATA:  25 year old female with pelvic pain. EXAM: OBSTETRIC <14 WK [place] AND TRANSVAGINAL OB US TECHNIQUE: Both transabdominal and transvaginal ultrasound examinations were  performed for complete evaluation of the gestation as well as the maternal uterus, adnexal regions, and pelvic cul-de-sac. Transvaginal technique was performed to assess early pregnancy. COMPARISON:  None. FINDINGS: There is a somewhat irregular cystic structure within the endometrial canal. No fetal pole or yolk sac identified. This cystic structure likely represent a blighted ovum. A pseudo gestation of an ectopic pregnancy is not excluded. Correlation with clinical exam and follow-up with serial HCG levels and ultrasound recommended. If this cystic structure is a gestational sac the estimated gestational age based on mean sac diameter of 3.8 cm is 9 weeks, 1 day. The ovaries appear unremarkable. The right ovary measures  3.2 x 2.1 x 1.9 cm and the left ovary measures 3.5 x 1.7 x 1.6 cm. No free fluid identified within the pelvis. IMPRESSION: Somewhat irregular cystic structure within the endometrium as described likely representing a blighted ovum. A pseudo gestation of an ectopic pregnancy is not excluded. Correlation with clinical exam and follow-up with serial HCG levels and ultrasound recommended. Electronically Signed   By: Elgie Collard M.D.   On: 06/03/2015 02:17   US Ob Transvaginal  06/03/2015  CLINICAL DATA:  25 year old female with pelvic pain. EXAM: OBSTETRIC <14 WK [place] AND TRANSVAGINAL OB US TECHNIQUE: Both transabdominal and transvaginal ultrasound examinations were performed for complete evaluation of the gestation as well as the maternal uterus, adnexal regions, and pelvic cul-de-sac. Transvaginal technique was performed to assess early pregnancy. COMPARISON:  None. FINDINGS: There is a somewhat irregular cystic structure within the endometrial canal. No fetal pole or yolk sac identified. This cystic structure likely represent a blighted ovum. A pseudo gestation of an ectopic pregnancy is not excluded. Correlation with clinical exam and follow-up with serial HCG levels and ultrasound  recommended. If this cystic structure is a gestational sac the estimated gestational age based on mean sac diameter of 3.8 cm is 9 weeks, 1 day. The ovaries appear unremarkable. The right ovary measures 3.2 x 2.1 x 1.9 cm and the left ovary measures 3.5 x 1.7 x 1.6 cm. No free fluid identified within the pelvis. IMPRESSION: Somewhat irregular cystic structure within the endometrium as described likely representing a blighted ovum. A pseudo gestation of an ectopic pregnancy is not excluded. Correlation with clinical exam and follow-up with serial HCG levels and ultrasound recommended. Electronically Signed   By: Elgie Collard M.D.   On: 06/03/2015 02:17    MAU Course  Procedures  MDM Patient given pain medication here. She states that she does not want an rx for pain medication because she has some at home, and if we give her some she "won't be able to get a new rx from my doctor".   Reviewed [place] results with the patient. Advised that SAB is most likely. Patient is very upset, and tearful. She states that she does want to come back in 48 hours so that she can be certain. This is a desired pregnancy.   Assessment and Plan   1. Threatened abortion   2. Pelvic pain affecting pregnancy in first trimester, antepartum    DC home Comfort measures reviewed  1st Trimester precautions  Bleeding precautions RX: ibuprofen  #30  Return to MAU as needed FU with OB as planned  Follow-up Information    Follow up with [facility].   Specialty:  Obstetrics and Gynecology   Why:  Wednesday 06/05/15 :00    Contact information:   [address] [place] [address] [phone]        Tawnya Crook 06/03/2015, 1:28 AM

## 2015-06-03 NOTE — Discharge Instructions (Signed)
Miscarriage  A miscarriage is the sudden loss of an unborn baby (fetus) before the 20th week of pregnancy. Most miscarriages happen in the first 3 months of pregnancy. Sometimes, it happens before a woman even knows she is pregnant. A miscarriage is also called a "spontaneous miscarriage" or "early pregnancy loss." Having a miscarriage can be an emotional experience. Talk with your caregiver about any questions you may have about miscarrying, the grieving process, and your future pregnancy plans.  CAUSES    Problems with the fetal chromosomes that make it impossible for the baby to develop normally. Problems with the baby's genes or chromosomes are most often the result of errors that occur, by chance, as the embryo divides and grows. The problems are not inherited from the parents.   Infection of the cervix or uterus.    Hormone problems.    Problems with the cervix, such as having an incompetent cervix. This is when the tissue in the cervix is not strong enough to hold the pregnancy.    Problems with the uterus, such as an abnormally shaped uterus, uterine fibroids, or congenital abnormalities.    Certain medical conditions.    Smoking, drinking alcohol, or taking illegal drugs.    Trauma.   Often, the cause of a miscarriage is unknown.   SYMPTOMS    Vaginal bleeding or spotting, with or without cramps or pain.   Pain or cramping in the abdomen or lower back.   Passing fluid, tissue, or blood clots from the vagina.  DIAGNOSIS   Your caregiver will perform a physical exam. You may also have an ultrasound to confirm the miscarriage. Blood or urine tests may also be ordered.  TREATMENT    Sometimes, treatment is not necessary if you naturally pass all the fetal tissue that was in the uterus. If some of the fetus or placenta remains in the body (incomplete miscarriage), tissue left behind may become infected and must be removed. Usually, a dilation and curettage (D and C) procedure is performed.  During a D and C procedure, the cervix is widened (dilated) and any remaining fetal or placental tissue is gently removed from the uterus.   Antibiotic medicines are prescribed if there is an infection. Other medicines may be given to reduce the size of the uterus (contract) if there is a lot of bleeding.   If you have Rh negative blood and your baby was Rh positive, you will need a Rh immunoglobulin shot. This shot will protect any future baby from having Rh blood problems in future pregnancies.  HOME CARE INSTRUCTIONS    Your caregiver may order bed rest or may allow you to continue light activity. Resume activity as directed by your caregiver.   Have someone help with home and family responsibilities during this time.    Keep track of the number of sanitary pads you use each day and how soaked (saturated) they are. Write down this information.    Do not use tampons. Do not douche or have sexual intercourse until approved by your caregiver.    Only take over-the-counter or prescription medicines for pain or discomfort as directed by your caregiver.    Do not take aspirin. Aspirin can cause bleeding.    Keep all follow-up appointments with your caregiver.    If you or your partner have problems with grieving, talk to your caregiver or seek counseling to help cope with the pregnancy loss. Allow enough time to grieve before trying to get pregnant again.     SEEK IMMEDIATE MEDICAL CARE IF:    You have severe cramps or pain in your back or abdomen.   You have a fever.   You pass large blood clots (walnut-sized or larger) ortissue from your vagina. Save any tissue for your caregiver to inspect.    Your bleeding increases.    You have a thick, bad-smelling vaginal discharge.   You become lightheaded, weak, or you faint.    You have chills.   MAKE SURE YOU:   Understand these instructions.   Will watch your condition.   Will get help right away if you are not doing well or get worse.     This  information is not intended to replace advice given to you by your health care provider. Make sure you discuss any questions you have with your health care provider.     Document Released: 08/19/2000 Document Revised: 06/20/2012 Document Reviewed: 04/14/2011  Elsevier Interactive Patient Education 2016 [employer]

## 2015-06-03 NOTE — MAU Note (Signed)
PT SAYS   LAST NIGHT  HAD  C RAMPS.       THIS  AM HAS  VAG  BLEEDING.  IN HER UNDERWEAR AND  WHEN  SHE  WIPES   AND  NOW  IN B-ROOM -  SAW  BLOOD  ON TOILET  PAPER.      LAST SEX-    SAT.     NO BIRTH   CONTROL.       DID HPT-    IN FEB -  POSITIVE.      PNC  - WILL BE  AT  CLINIC.

## 2015-06-05 ENCOUNTER — Inpatient Hospital Stay (HOSPITAL_COMMUNITY): Payer: Medicaid Other

## 2015-06-05 ENCOUNTER — Ambulatory Visit: Payer: Medicaid Other | Admitting: Obstetrics & Gynecology

## 2015-06-05 ENCOUNTER — Inpatient Hospital Stay (HOSPITAL_COMMUNITY)
Admission: AD | Admit: 2015-06-05 | Discharge: 2015-06-05 | Disposition: A | Discharge status: Home / Self Care | Payer: Medicaid Other | Source: Ambulatory Visit | Attending: Obstetrics & Gynecology | Admitting: Obstetrics & Gynecology

## 2015-06-05 ENCOUNTER — Encounter (HOSPITAL_COMMUNITY): Payer: Self-pay | Admitting: *Deleted

## 2015-06-05 DIAGNOSIS — O209 Hemorrhage in early pregnancy, unspecified: Secondary | ICD-10-CM

## 2015-06-05 DIAGNOSIS — Z87891 Personal history of nicotine dependence: Secondary | ICD-10-CM | POA: Insufficient documentation

## 2015-06-05 DIAGNOSIS — Z8249 Family history of ischemic heart disease and other diseases of the circulatory system: Secondary | ICD-10-CM | POA: Insufficient documentation

## 2015-06-05 DIAGNOSIS — Z3A12 12 weeks gestation of pregnancy: Secondary | ICD-10-CM | POA: Insufficient documentation

## 2015-06-05 DIAGNOSIS — J45909 Unspecified asthma, uncomplicated: Secondary | ICD-10-CM | POA: Insufficient documentation

## 2015-06-05 DIAGNOSIS — O039 Complete or unspecified spontaneous abortion without complication: Secondary | ICD-10-CM | POA: Diagnosis not present

## 2015-06-05 LAB — CBC
HEMATOCRIT: 32.2 % — AB (ref 36.0–46.0)
HEMATOCRIT: 27.7 % — AB (ref 36.0–46.0)
HEMOGLOBIN: 10.8 g/dL — AB (ref 12.0–15.0)
HEMOGLOBIN: 9.1 g/dL — AB (ref 12.0–15.0)
MCH: 28.8 pg (ref 26.0–34.0)
MCH: 28.2 pg (ref 26.0–34.0)
MCHC: 33.5 g/dL (ref 30.0–36.0)
MCHC: 32.9 g/dL (ref 30.0–36.0)
MCV: 85.9 fL (ref 78.0–100.0)
MCV: 85.8 fL (ref 78.0–100.0)
PLATELETS: 170 10*3/uL (ref 150–400)
Platelets: 233 10*3/uL (ref 150–400)
RBC: 3.75 MIL/uL — ABNORMAL LOW (ref 3.87–5.11)
RBC: 3.23 MIL/uL — ABNORMAL LOW (ref 3.87–5.11)
RDW: 13.8 % (ref 11.5–15.5)
RDW: 13.8 % (ref 11.5–15.5)
WBC: 5.6 10*3/uL (ref 4.0–10.5)
WBC: 9.4 10*3/uL (ref 4.0–10.5)

## 2015-06-05 LAB — HCG, QUANTITATIVE, PREGNANCY: hCG, Beta Chain, Quant, S: 7331 m[IU]/mL — ABNORMAL HIGH (ref <5)

## 2015-06-05 MED ORDER — HYDROMORPHONE HCL 1 MG/ML IJ SOLN
1.0000 mg | Freq: Once | INTRAMUSCULAR | Status: AC
Start: 2015-06-05 — End: 2015-06-05
  Administered 2015-06-05: 1 mg via INTRAVENOUS
  Filled 2015-06-05: qty 1

## 2015-06-05 MED ORDER — OXYCODONE-ACETAMINOPHEN 5-325 MG PO TABS: 2.0000 | ORAL_TABLET | Freq: Once | ORAL | Status: DC

## 2015-06-05 MED ORDER — IBUPROFEN 800 MG PO TABS: 800.0000 mg | ORAL_TABLET | Freq: Three times a day (TID) | ORAL | Status: DC | PRN

## 2015-06-05 MED ORDER — MISOPROSTOL 200 MCG PO TABS
200.0000 ug | ORAL_TABLET | Freq: Once | ORAL | Status: AC
  Administered 2015-06-05: 200 ug via RECTAL
  Filled 2015-06-05: qty 1

## 2015-06-05 MED ORDER — LACTATED RINGERS IV BOLUS (SEPSIS)
1000.0000 mL | Freq: Once | INTRAVENOUS | Status: AC
  Administered 2015-06-05: 1000 mL via INTRAVENOUS

## 2015-06-05 MED ORDER — HYDROMORPHONE HCL 1 MG/ML IJ SOLN
1.0000 mg | Freq: Once | INTRAMUSCULAR | Status: AC
  Administered 2015-06-05: 1 mg via INTRAVENOUS
  Filled 2015-06-05: qty 1

## 2015-06-05 MED ORDER — MISOPROSTOL 200 MCG PO TABS
800.0000 ug | ORAL_TABLET | Freq: Once | ORAL | Status: AC
  Administered 2015-06-05: 800 ug via BUCCAL
  Filled 2015-06-05: qty 4

## 2015-06-05 NOTE — Discharge Instructions (Signed)
Miscarriage  A miscarriage is the sudden loss of an unborn baby (fetus) before the 20th week of pregnancy. Most miscarriages happen in the first 3 months of pregnancy. Sometimes, it happens before a woman even knows she is pregnant. A miscarriage is also called a "spontaneous miscarriage" or "early pregnancy loss." Having a miscarriage can be an emotional experience. Talk with your caregiver about any questions you may have about miscarrying, the grieving process, and your future pregnancy plans.  CAUSES    Problems with the fetal chromosomes that make it impossible for the baby to develop normally. Problems with the baby's genes or chromosomes are most often the result of errors that occur, by chance, as the embryo divides and grows. The problems are not inherited from the parents.   Infection of the cervix or uterus.    Hormone problems.    Problems with the cervix, such as having an incompetent cervix. This is when the tissue in the cervix is not strong enough to hold the pregnancy.    Problems with the uterus, such as an abnormally shaped uterus, uterine fibroids, or congenital abnormalities.    Certain medical conditions.    Smoking, drinking alcohol, or taking illegal drugs.    Trauma.   Often, the cause of a miscarriage is unknown.   SYMPTOMS    Vaginal bleeding or spotting, with or without cramps or pain.   Pain or cramping in the abdomen or lower back.   Passing fluid, tissue, or blood clots from the vagina.  DIAGNOSIS   Your caregiver will perform a physical exam. You may also have an ultrasound to confirm the miscarriage. Blood or urine tests may also be ordered.  TREATMENT    Sometimes, treatment is not necessary if you naturally pass all the fetal tissue that was in the uterus. If some of the fetus or placenta remains in the body (incomplete miscarriage), tissue left behind may become infected and must be removed. Usually, a dilation and curettage (D and C) procedure is performed.  During a D and C procedure, the cervix is widened (dilated) and any remaining fetal or placental tissue is gently removed from the uterus.   Antibiotic medicines are prescribed if there is an infection. Other medicines may be given to reduce the size of the uterus (contract) if there is a lot of bleeding.   If you have Rh negative blood and your baby was Rh positive, you will need a Rh immunoglobulin shot. This shot will protect any future baby from having Rh blood problems in future pregnancies.  HOME CARE INSTRUCTIONS    Your caregiver may order bed rest or may allow you to continue light activity. Resume activity as directed by your caregiver.   Have someone help with home and family responsibilities during this time.    Keep track of the number of sanitary pads you use each day and how soaked (saturated) they are. Write down this information.    Do not use tampons. Do not douche or have sexual intercourse until approved by your caregiver.    Only take over-the-counter or prescription medicines for pain or discomfort as directed by your caregiver.    Do not take aspirin. Aspirin can cause bleeding.    Keep all follow-up appointments with your caregiver.    If you or your partner have problems with grieving, talk to your caregiver or seek counseling to help cope with the pregnancy loss. Allow enough time to grieve before trying to get pregnant again.     SEEK IMMEDIATE MEDICAL CARE IF:    You have severe cramps or pain in your back or abdomen.   You have a fever.   You pass large blood clots (walnut-sized or larger) ortissue from your vagina. Save any tissue for your caregiver to inspect.    Your bleeding increases.    You have a thick, bad-smelling vaginal discharge.   You become lightheaded, weak, or you faint.    You have chills.   MAKE SURE YOU:   Understand these instructions.   Will watch your condition.   Will get help right away if you are not doing well or get worse.     This  information is not intended to replace advice given to you by your health care provider. Make sure you discuss any questions you have with your health care provider.     Document Released: 08/19/2000 Document Revised: 06/20/2012 Document Reviewed: 04/14/2011  Elsevier Interactive Patient Education 2016 [employer]

## 2015-06-05 NOTE — MAU Note (Signed)
Pt. Upset that she isn't getting admitted for observation. Pt. Is concerned about going home with bleeding and pain and then having to return back to the hospital if pain gets worse.  Reassured pt. That we would not send her home if she was unstable.  Waiting for lab results.  Pt. Is requesting pain medicine.

## 2015-06-05 NOTE — MAU Provider Note (Signed)
History     CSN: [id number]  Arrival date and time: 06/05/15 1526   First Provider Initiated Contact with Patient 06/05/15 1559         Chief Complaint  Patient presents with  . Vaginal Bleeding  . Abdominal Pain   HPI Ana [name] is a [age] y.o. [id number] at [id number] by LMP who presents with abdominal pain & vaginal bleeding. Was seen in MAU 2 days ago, ultrasound showed probably gestational sac. Patient was to return to Clinic this morning for f/u BHCG but didn't come d/t pain & bleeding. Reports increase in symptoms this morning. States has been going through 2 pads per hour. Rates lower abdominal pain 10/10 & states they feel like contractions. Took oxycodone earlier today with no relief.   OB History    Gravida Para Term Preterm AB TAB SAB Ectopic Multiple Living   7 5 5  0 1 0 1 0 0 5      Past Medical History  Diagnosis Date  . Chlamydia Jan 2014  . Abnormal Pap smear   . Asthma   . Kidney infection     Past Surgical History  Procedure Laterality Date  . Wisdom tooth extraction      Family History  Problem Relation Age of Onset  . Hypertension Mother   . Hypertension Maternal Grandmother     Social History  Substance Use Topics  . Smoking status: Former Smoker    Quit date: 04/25/2014  . Smokeless tobacco: Never Used  . Alcohol Use: No     Comment: occasional/not since pregnancy    Allergies: No Known Allergies  Prescriptions prior to admission  Medication Sig Dispense Refill Last Dose  . ibuprofen (ADVIL,MOTRIN) 800 MG tablet Take 1 tablet (800 mg total) by mouth every 8 (eight) hours as needed. (Patient taking differently: Take 800 mg by mouth every 8 (eight) hours as needed for moderate pain. ) 30 tablet 0 Past Week at Unknown time  . oxyCODONE (ROXICODONE) 15 MG immediate release tablet Take 1 tablet (15 mg total) by mouth every 6 (six) hours as needed for severe pain. 56 tablet 0 06/05/2015 at Unknown time  . doxycycline (VIBRA-TABS) 100 MG tablet Take 1  tablet (100 mg total) by mouth 2 (two) times daily. (Patient not taking: Reported on 06/03/2015) 28 tablet 0   . metroNIDAZOLE (FLAGYL) 500 MG tablet Take 1 tablet (500 mg total) by mouth every 12 (twelve) hours. (Patient not taking: Reported on 06/03/2015) 28 tablet 0   . ondansetron (ZOFRAN) 4 MG tablet Take 1 tablet (4 mg total) by mouth every 6 (six) hours. (Patient not taking: Reported on 06/03/2015) 12 tablet 0 01/05/2015 at Unknown time  . oxyCODONE (OXY IR/ROXICODONE) 5 MG immediate release tablet Take 1 tablet (5 mg total) by mouth every 3 (three) hours as needed for breakthrough pain (not relieved 15mg  dose). (Patient not taking: Reported on 06/03/2015) 10 tablet 0 Not Taking at Unknown time  . senna-docusate (SENOKOT-S) 8.6-50 MG tablet Take 2 tablets by mouth at bedtime as needed for mild constipation or moderate constipation. (Patient not taking: Reported on 06/03/2015) 30 tablet 1 Not Taking at Unknown time    Review of Systems  Constitutional: Negative.   Gastrointestinal: Positive for abdominal pain. Negative for nausea, vomiting, diarrhea and constipation.  Genitourinary:       + vaginal bleeding  Neurological: Negative for dizziness and headaches.   Physical Exam   Blood pressure 134/61, pulse 72, temperature 98.1 F (36.7 C), temperature source  Oral, resp. rate 18, last menstrual period 03/11/2015, currently breastfeeding.  Physical Exam  Nursing note and vitals reviewed. Constitutional: She is oriented to person, place, and time. She appears well-developed and well-nourished. She appears distressed.  HENT:  Head: Normocephalic and atraumatic.  Eyes: Conjunctivae are normal. Right eye exhibits no discharge. Left eye exhibits no discharge. No scleral icterus.  Neck: Normal range of motion.  Cardiovascular: Normal rate, regular rhythm and normal heart sounds.   No murmur heard. Respiratory: Effort normal and breath sounds normal. No respiratory distress. She has no wheezes.   GI: Soft. Bowel sounds are normal. There is tenderness.  Genitourinary: There is bleeding in the vagina.  Moderate amount of bleeding. Several large clots removed, approximately 4-8 cm in size  Neurological: She is alert and oriented to person, place, and time.  Skin: Skin is warm and dry. She is not diaphoretic.  Psychiatric: She has a normal mood and affect. Her behavior is normal. Judgment and thought content normal.    MAU Course  Procedures Results for orders placed or performed during the hospital encounter of 06/05/15 (from the past 24 hour(s))  hCG, quantitative, pregnancy     Status: Abnormal   Collection Time: 06/05/15  3:55 PM  Result Value Ref Range   hCG, Beta Chain, Quant, S 7331 (H) <5 mIU/mL  CBC     Status: Abnormal   Collection Time: 06/05/15  3:55 PM  Result Value Ref Range   WBC 5.6 4.0 - 10.5 K/uL   RBC 3.75 (L) 3.87 - 5.11 MIL/uL   Hemoglobin 10.8 (L) 12.0 - 15.0 g/dL   HCT 16.1 (L) 09.6 - 04.5 %   MCV 85.9 78.0 - 100.0 fL   MCH 28.8 26.0 - 34.0 pg   MCHC 33.5 30.0 - 36.0 g/dL   RDW 40.9 81.1 - 91.4 %   Platelets 170 150 - 400 K/uL   US Ob Transvaginal  06/05/2015  CLINICAL DATA:  Vaginal bleed EXAM: TRANSVAGINAL OB ULTRASOUND TECHNIQUE: Transvaginal study was performed to optimize visceral architecture evaluation. COMPARISON:  June 03, 2015 FINDINGS: Intrauterine gestational sac: Not visualized Yolk sac:  Not visualized Embryo:  Not visualized Cardiac Activity: Not visualized Maternal uterus/adnexae: Uterus measures 12.7 x 6.2 x 8.0 cm. There is no demonstrable intrauterine mass or gestation. There is thickening of the endometrium to 3.1 cm with complex echotexture within the thickened endometrium. There is mild vascularity in the area of the endometrium. Right ovary measures 2.4 x 2.1 x 2.4 cm. Left ovary measures 2.2 x 2.1 x 1.9 cm. There is no extrauterine pelvic or adnexal mass. Moderate free fluid is noted in the cul-de-sac. IMPRESSION: The previously noted  irregular sac in the endometrium is no longer appreciable. There is evidence of hemorrhage within the endometrium with endometrial thickening and mild vascularity based on color Doppler in this area. The appearance is consistent with spontaneous abortion. This finding warrants continued surveillance of beta HCG. Repeat ultrasound in approximately 7 days to further evaluate the endometrium may be warranted. While unlikely, a degree of retained products of conception cannot be entirely excluded sonographically at this point. There is moderate free fluid in the cul-de-sac. No extrauterine masses are appreciable. Electronically Signed   By: Bretta Bang III M.D.   On: 06/05/2015 17:18    MDM O positive Hemoglobin stable VSS, pt not orthostatic Initial exam complicated by significant bleeding & pt discomfort. Pain meds & ultrasound ordered. Pelvic exam will be repeated after pain medication.  Dilaudid 1 mg  IV prior to ultrasound Ultrasound - gestational sac no longer present Removed moderate amount of clots during 2nd pelvic exam. Vaginal bleeding & abdominal pain continues. 2nd dose of dilaudid given & Cytotec 800 mcg ordered.   2000- Care turned over to [facility]      Judeth Horn, NP 06/05/2015 8:12 PM  Assessment and Plan   Evaluated pt 1 hour after Cytotec dose given.  Pt reports some dizziness, unchanged with standing or sitting up, that she thinks may be related to the Dilaudid.  She reports the bleeding is still heavy and she is concerned about this.  Her pain is improved but she feels like it is increasing again.    In 1 hour, she has soaked 1/4 of two different large pads with some moderate sized clots.  Orthostatic VS were normal.    Consult Dr Erin Fulling.  Give 200 mcg Cytotec rectally so total dose 1000 mcg of Cytotec. Observe 1-2 hours more.  Repeat CBC.  If bleeding/hgb stable, discharge home with f/u in clinic.  Report to Thressa Sheller, CNM   2242: Bleeding  is very minimal at this point. She states that she is still having cramping. She is refusing any PO medications at this time. She is requesting dilaudid. Patient has 15 mg oxycodone tabs at home. Patient advised that she can have one more dose of dilaudid, but that bleeding is light, and CBC is stable. She will be DC home after that dose.   2308: Bleeding is scant at this time.    1. SAB (spontaneous abortion)   2. Vaginal bleeding in pregnancy, first trimester    DC home Comfort measures reviewed  Bleeding precautions RX: ibuprofen  Q 8 hours Return to MAU as needed FU with OB as planned  Follow-up Information    Follow up with [facility].   Specialty:  Obstetrics and Gynecology   Why:  They will call you with an appointment   Contact information:   [address] [place] [address] [phone]

## 2015-06-05 NOTE — MAU Note (Signed)
Pt reports she has been having increased vaginal bleeding today has used 3 pads in the past hour. Missed appointment this morning for follow up appointment.

## 2015-06-15 ENCOUNTER — Encounter: Payer: Self-pay | Admitting: Obstetrics & Gynecology

## 2015-06-24 ENCOUNTER — Encounter: Payer: Medicaid Other | Admitting: Student

## 2015-06-28 ENCOUNTER — Telehealth: Payer: Self-pay | Admitting: Obstetrics and Gynecology

## 2015-06-28 ENCOUNTER — Other Ambulatory Visit: Payer: Self-pay | Admitting: Women's Health

## 2015-06-28 ENCOUNTER — Other Ambulatory Visit: Payer: Self-pay | Admitting: Advanced Practice Midwife

## 2015-06-28 NOTE — Telephone Encounter (Signed)
Ana LundJanet,  I do not see which med she is requesting. I have only filled opiates, which are not eligible for refil x by appt/

## 2015-07-01 NOTE — Telephone Encounter (Signed)
Left message x 1. JSY 

## 2015-07-02 NOTE — Telephone Encounter (Signed)
Left message x 2. JSY 

## 2015-07-03 NOTE — Telephone Encounter (Signed)
Spoke with pt. Pt states she followed up with her PCP. Encounter closed. JSY

## 2015-07-03 NOTE — Telephone Encounter (Signed)
Left message x 3. JSY 

## 2016-03-03 ENCOUNTER — Other Ambulatory Visit: Payer: Self-pay | Admitting: Obstetrics and Gynecology

## 2016-03-03 NOTE — Telephone Encounter (Signed)
Has not been seen in essentially 18 months. Would need appt to reassess. [facility], please advise pt.

## 2016-03-09 NOTE — L&D Delivery Note (Signed)
26 y.o. [id number] at 6717w5d admitted for SROM delivered a viable female infant at 1147 in cephalic, ROA position. No nuchal cord. Left anterior shoulder delivered with ease. 60 sec delayed cord clamping. Cord clamped x2 and cut. Placenta delivered spontaneously intact, with 3VC. Fundus firm on exam with massage and pitocin. Good hemostasis noted.  Anesthesia: Epidural Laceration: First degree labial- hemostatic and approximated- no suture Good hemostasis noted. EBL: 250 cc  Mom and baby recovering in LDR.    Apgars: APGAR (1 MIN): 9   APGAR (5 MINS): 9   Weight: Pending skin to skin  Sponge and instrument count were correct x2. Placenta sent to L&D.  [place] Atreus Hasz, DO FM Resident PGY-1 10/14/2016 12:03 PM

## 2016-03-10 ENCOUNTER — Other Ambulatory Visit: Payer: Self-pay | Admitting: *Deleted

## 2016-03-10 NOTE — Telephone Encounter (Signed)
Spoke with pharmacy

## 2016-03-10 NOTE — Telephone Encounter (Signed)
[facility] called to inform them that this patient has not been seen in our office. [facility]. Number was given to pharmacy assistant.

## 2016-04-01 ENCOUNTER — Encounter: Payer: Medicaid Other | Admitting: Obstetrics & Gynecology

## 2016-04-07 ENCOUNTER — Encounter (HOSPITAL_COMMUNITY): Payer: Self-pay

## 2016-04-20 ENCOUNTER — Inpatient Hospital Stay (HOSPITAL_COMMUNITY): Payer: Medicaid Other

## 2016-04-20 ENCOUNTER — Encounter (HOSPITAL_COMMUNITY): Payer: Self-pay | Admitting: *Deleted

## 2016-04-20 ENCOUNTER — Other Ambulatory Visit: Payer: Self-pay | Admitting: Student

## 2016-04-20 ENCOUNTER — Inpatient Hospital Stay (HOSPITAL_COMMUNITY)
Admission: AD | Admit: 2016-04-20 | Discharge: 2016-04-20 | Disposition: A | Discharge status: Home / Self Care | Payer: Medicaid Other | Source: Ambulatory Visit | Attending: Obstetrics and Gynecology | Admitting: Obstetrics and Gynecology

## 2016-04-20 DIAGNOSIS — R102 Pelvic and perineal pain: Secondary | ICD-10-CM | POA: Diagnosis present

## 2016-04-20 DIAGNOSIS — Z3A16 16 weeks gestation of pregnancy: Secondary | ICD-10-CM | POA: Diagnosis not present

## 2016-04-20 DIAGNOSIS — O26892 Other specified pregnancy related conditions, second trimester: Secondary | ICD-10-CM | POA: Insufficient documentation

## 2016-04-20 DIAGNOSIS — R1084 Generalized abdominal pain: Secondary | ICD-10-CM

## 2016-04-20 DIAGNOSIS — R109 Unspecified abdominal pain: Secondary | ICD-10-CM | POA: Diagnosis not present

## 2016-04-20 HISTORY — DX: Unspecified abnormal cytological findings in specimens from vagina: R87.629

## 2016-04-20 LAB — CBC
HCT: 31.7 % — ABNORMAL LOW (ref 36.0–46.0)
Hemoglobin: 10.5 g/dL — ABNORMAL LOW (ref 12.0–15.0)
MCH: 27.3 pg (ref 26.0–34.0)
MCHC: 33.1 g/dL (ref 30.0–36.0)
MCV: 82.3 fL (ref 78.0–100.0)
Platelets: 201 10*3/uL (ref 150–400)
RBC: 3.85 MIL/uL — AB (ref 3.87–5.11)
RDW: 17 % — ABNORMAL HIGH (ref 11.5–15.5)
WBC: 7.2 10*3/uL (ref 4.0–10.5)

## 2016-04-20 LAB — WET PREP, GENITAL
Sperm: NONE SEEN
TRICH WET PREP: NONE SEEN
YEAST WET PREP: NONE SEEN

## 2016-04-20 LAB — COMPREHENSIVE METABOLIC PANEL
ALK PHOS: 40 U/L (ref 38–126)
ALT: 10 U/L — AB (ref 14–54)
AST: 16 U/L (ref 15–41)
Albumin: 3.5 g/dL (ref 3.5–5.0)
Anion gap: 7 (ref 5–15)
BILIRUBIN TOTAL: 0.6 mg/dL (ref 0.3–1.2)
BUN: 9 mg/dL (ref 6–20)
CALCIUM: 9 mg/dL (ref 8.9–10.3)
CHLORIDE: 103 mmol/L (ref 101–111)
CO2: 23 mmol/L (ref 22–32)
CREATININE: 0.64 mg/dL (ref 0.44–1.00)
Glucose, Bld: 81 mg/dL (ref 65–99)
Potassium: 3.9 mmol/L (ref 3.5–5.1)
Sodium: 133 mmol/L — ABNORMAL LOW (ref 135–145)
TOTAL PROTEIN: 6.6 g/dL (ref 6.5–8.1)

## 2016-04-20 LAB — URINALYSIS, ROUTINE W REFLEX MICROSCOPIC
BILIRUBIN URINE: NEGATIVE
Glucose, UA: NEGATIVE mg/dL
Hgb urine dipstick: NEGATIVE
Ketones, ur: NEGATIVE mg/dL
Leukocytes, UA: NEGATIVE
NITRITE: NEGATIVE
PH: 7 (ref 5.0–8.0)
Protein, ur: NEGATIVE mg/dL
SPECIFIC GRAVITY, URINE: 1.024 (ref 1.005–1.030)

## 2016-04-20 MED ORDER — ONDANSETRON HCL 8 MG PO TABS: 8.0000 mg | ORAL_TABLET | Freq: Three times a day (TID) | ORAL | 0 refills | Status: DC | PRN

## 2016-04-20 MED ORDER — OXYCODONE-ACETAMINOPHEN 5-325 MG PO TABS: 1.0000 | ORAL_TABLET | ORAL | 0 refills | Status: DC | PRN

## 2016-04-20 MED ORDER — ONDANSETRON HCL 4 MG PO TABS
8.0000 mg | ORAL_TABLET | Freq: Once | ORAL | Status: DC
Start: 2016-04-20 — End: 2016-04-20
  Filled 2016-04-20: qty 2

## 2016-04-20 MED ORDER — ACETAMINOPHEN 325 MG PO TABS
650.0000 mg | ORAL_TABLET | Freq: Four times a day (QID) | ORAL | Status: DC | PRN
  Administered 2016-04-20: 650 mg via ORAL
  Filled 2016-04-20: qty 2

## 2016-04-20 MED ORDER — METRONIDAZOLE 500 MG PO TABS: 500.0000 mg | ORAL_TABLET | Freq: Three times a day (TID) | ORAL | 0 refills | Status: DC

## 2016-04-20 MED ORDER — ONDANSETRON HCL 4 MG PO TABS
8.0000 mg | ORAL_TABLET | Freq: Once | ORAL | Status: AC
Start: 2016-04-20 — End: 2016-04-20
  Administered 2016-04-20: 8 mg via ORAL

## 2016-04-20 NOTE — Discharge Instructions (Signed)
Abdominal Pain During Pregnancy °Belly (abdominal) pain is common during pregnancy. Most of the time, it is not a serious problem. Other times, it can be a sign that something is wrong with the pregnancy. Always tell your doctor if you have belly pain. °Follow these instructions at home: °Monitor your belly pain for any changes. The following actions may help you feel better: °· Do not have sex (intercourse) or put anything in your vagina until you feel better. °· Rest until your pain stops. °· Drink clear fluids if you feel sick to your stomach (nauseous). Do not eat solid food until you feel better. °· Only take medicine as told by your doctor. °· Keep all doctor visits as told. °Get help right away if: °· You are bleeding, leaking fluid, or pieces of tissue come out of your vagina. °· You have more pain or cramping. °· You keep throwing up (vomiting). °· You have pain when you pee (urinate) or have blood in your pee. °· You have a fever. °· You do not feel your baby moving as much. °· You feel very weak or feel like passing out. °· You have trouble breathing, with or without belly pain. °· You have a very bad headache and belly pain. °· You have fluid leaking from your vagina and belly pain. °· You keep having watery poop (diarrhea). °· Your belly pain does not go away after resting, or the pain gets worse. °This information is not intended to replace advice given to you by your health care provider. Make sure you discuss any questions you have with your health care provider. °Document Released: 02/11/2009 Document Revised: 10/02/2015 Document Reviewed: 09/22/2012 °Elsevier Interactive Patient Education © 2017 Elsevier Inc. ° °

## 2016-04-20 NOTE — Progress Notes (Signed)
Patient lying on left side in fetal position.

## 2016-04-20 NOTE — MAU Note (Signed)
Nauseated, found out preg about a wk ago.  Feeling light headed a lot lately. Chest has been getting tight, only time she every has a problem is when she is preg.  Denies cough or cold.

## 2016-04-20 NOTE — MAU Note (Signed)
Found out preg, while incarcerated, was bleeding at that time. Had light bleeding for a wk or two

## 2016-04-21 ENCOUNTER — Ambulatory Visit (INDEPENDENT_AMBULATORY_CARE_PROVIDER_SITE_OTHER): Payer: Medicaid Other | Admitting: Student

## 2016-04-21 ENCOUNTER — Ambulatory Visit: Payer: Self-pay

## 2016-04-21 ENCOUNTER — Encounter: Payer: Self-pay | Admitting: Student

## 2016-04-21 ENCOUNTER — Ambulatory Visit (INDEPENDENT_AMBULATORY_CARE_PROVIDER_SITE_OTHER): Payer: Self-pay | Admitting: Clinical

## 2016-04-21 VITALS — BP 114/59 | HR 57 | Wt 158.0 lb

## 2016-04-21 DIAGNOSIS — O219 Vomiting of pregnancy, unspecified: Secondary | ICD-10-CM | POA: Diagnosis not present

## 2016-04-21 DIAGNOSIS — Z789 Other specified health status: Secondary | ICD-10-CM

## 2016-04-21 DIAGNOSIS — O3680X0 Pregnancy with inconclusive fetal viability, not applicable or unspecified: Secondary | ICD-10-CM | POA: Diagnosis not present

## 2016-04-21 DIAGNOSIS — Z3689 Encounter for other specified antenatal screening: Secondary | ICD-10-CM | POA: Diagnosis not present

## 2016-04-21 DIAGNOSIS — F4323 Adjustment disorder with mixed anxiety and depressed mood: Secondary | ICD-10-CM

## 2016-04-21 DIAGNOSIS — Z3480 Encounter for supervision of other normal pregnancy, unspecified trimester: Secondary | ICD-10-CM

## 2016-04-21 DIAGNOSIS — J4520 Mild intermittent asthma, uncomplicated: Secondary | ICD-10-CM

## 2016-04-21 DIAGNOSIS — Z3482 Encounter for supervision of other normal pregnancy, second trimester: Secondary | ICD-10-CM

## 2016-04-21 DIAGNOSIS — O0992 Supervision of high risk pregnancy, unspecified, second trimester: Secondary | ICD-10-CM | POA: Insufficient documentation

## 2016-04-21 LAB — CULTURE, OB URINE: CULTURE: NO GROWTH

## 2016-04-21 LAB — GC/CHLAMYDIA PROBE AMP (~~LOC~~) NOT AT ARMC
CHLAMYDIA, DNA PROBE: NEGATIVE
Neisseria Gonorrhea: NEGATIVE

## 2016-04-21 MED ORDER — ALBUTEROL SULFATE HFA 108 (90 BASE) MCG/ACT IN AERS: 2.0000 | INHALATION_SPRAY | Freq: Four times a day (QID) | RESPIRATORY_TRACT | 2 refills | Status: DC | PRN

## 2016-04-21 MED ORDER — PROMETHAZINE HCL 25 MG PO TABS: 25.0000 mg | ORAL_TABLET | Freq: Four times a day (QID) | ORAL | 0 refills | Status: DC | PRN

## 2016-04-21 MED ORDER — CETIRIZINE HCL 10 MG PO TABS: 10.0000 mg | ORAL_TABLET | Freq: Every day | ORAL | 6 refills | Status: DC

## 2016-04-21 NOTE — BH Specialist Note (Signed)
Session Start time: 1:30  End Time: 2:00 Total Time:  30 minutes Type of Service: Behavioral Health - Individual/Family Interpreter: No.   Interpreter Name & Language: n/a # [facility]  SUBJECTIVE: Ana [name] is a 26 y.o. female  Pt. was referred by Judeth HornErin Lawrence, NP for:  anxiety and depression. Pt. reports the following symptoms/concerns: Pt states that she is most concerned with feeling "overwhelmed with life stuff" and being overly tiredand thinks she may have had a panic attack yesterday; used to use a sleep machine that helped with sleep, and BH meds helped prior to pregnancy. 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  Pt may benefit from psychoeducation and brief therapeutic intervention regarding coping with symptoms of anxiety and depression.  PLAN: 1. F/U with behavioral health clinician: Two weeks, or as needed(will practice relaxation breathing exercise at next visit) 2. Behavioral Health meds: none  3. Behavioral recommendations:  -Use phone app for sleep sounds, beginning 9pm nightly -Talk to medical provider at next visit  regarding anemia; if still anemic, will discuss with provider about taking low-acid orange juice with iron -Read educational materials regarding coping with symptoms of anxiety and depression 4. Referral: Brief Counseling/Psychotherapy and Psychoeducation 5. From scale of 1-10, how likely are you to follow plan: 8  Woc-Behavioral Health Clinician  Behavioral Health Clinician  Marlon PelWarmhandoff:   Warm Hand Off Completed.        Depression screen PHQ 2/9 04/21/2016  Decreased Interest 3  Down, Depressed, Hopeless 1  PHQ - 2 Score 4  Altered sleeping 3  Tired, decreased energy 3  Change in appetite 3  Feeling bad or failure about yourself  0  Trouble concentrating 1  Moving slowly or fidgety/restless 2  Suicidal thoughts 0  PHQ-9 Score 16   GAD 7 : Generalized Anxiety Score 04/21/2016  Nervous, Anxious, on Edge 3  Control/stop worrying 3  Worry too much - different things 3  Trouble relaxing 3  Restless 3  Easily annoyed or irritable 3  Afraid - awful might happen 3  Total GAD 7 Score 21

## 2016-04-21 NOTE — Progress Notes (Signed)
Pt informed that the ultrasound is considered a limited OB ultrasound and is not intended to be a complete ultrasound exam.  Patient also informed that the ultrasound is not being completed with the intent of assessing for fetal or placental anomalies or any pelvic abnormalities.  Explained that the purpose of today's ultrasound is to assess for viability and EGA.  Patient acknowledges the purpose of the exam and the limitations of the study.    Single IUP FM present Cardiac activity present (ascultated with doppler) CRL - 6.13 cm (12w 4d) FL -    1.11 cm  (13w 2d)

## 2016-04-21 NOTE — Progress Notes (Signed)
Subjective:    Ana [name] is a [id number] 2674w2d being seen today for her first obstetrical visit.  Her obstetrical history is significant for second trimester PPROM/loss. Patient is unsure if she plans to breast feed. Pregnancy history fully reviewed. TSVD x 5. Had PPROM with delivery at 6667w6d in 2014. Subsequent pregnancy was full term; was given 17-P throughout pregnancy.   Pt was seen in MAU yesterday for abdominal pain. States pain has since resolved & she has no current complaints.  Has history of mild intermittent asthma -- ran out of her inhaler. Currently denies SOB, wheezing, or chest tightness.   Patient reports no complaints.  Vitals:   04/21/16 1246  BP: (!) 114/59  Pulse: (!) 57  Weight: 158 lb (71.7 kg)    HISTORY: OB History  Gravida Para Term Preterm AB Living  8 5 5  0 2 5  SAB TAB Ectopic Multiple Live Births  2 0 0 0 5    # Outcome Date GA Lbr Len/2nd Weight Sex Delivery Anes PTL Lv  8 Current           7 SAB 2017          6 Term 10/18/14 7837w0d 01:57 / 00:35 5 lb 9.4 oz (2.535 kg) M Vag-Spont EPI  LIV     Birth Comments: WNL  5 SAB 08/29/12 7567w6d 112:20 / 00:03 6 oz (0.17 kg) M Vag-Spont None  FD  4 Term 05/13/10   6 lb (2.722 kg) F Vag-Spont   LIV  3 Term 06/11/08   6 lb (2.722 kg) F Vag-Spont   LIV  2 Term 08/21/06   6 lb 14 oz (3.118 kg) M Vag-Spont   LIV  1 Term 03/30/05   7 lb (3.175 kg) F Vag-Spont   LIV     Past Medical History:  Diagnosis Date  . Abnormal Pap smear   . Asthma   . Chlamydia Jan 2014  . Kidney infection   . Vaginal Pap smear, abnormal    Past Surgical History:  Procedure Laterality Date  . WISDOM TOOTH EXTRACTION     Family History  Problem Relation Age of Onset  . Hypertension Mother   . Hypertension Maternal Grandmother      Exam   BP (!) 114/59   Pulse (!) 57   Wt 158 lb (71.7 kg)   LMP 12/29/2015 (Approximate)   BMI 27.12 kg/m   Uterus:     Pelvic Exam: Deferred as patient had pelvic exam yesterday in MAU   Skin: normal coloration and turgor, no rashes    Neurologic: oriented, normal mood   Extremities: normal strength, tone, and muscle mass   HEENT PERRLA   Mouth/Teeth mucous membranes moist, pharynx normal without lesions and dental hygiene good   Neck supple and no masses   Cardiovascular: regular rate and rhythm   Respiratory:  appears well, vitals normal, no respiratory distress, acyanotic, normal RR, chest clear, no wheezing, crepitations, rhonchi, normal symmetric air entry   Abdomen: soft, non-tender; bowel sounds normal; no masses,  no organomegaly      Assessment:    Pregnancy: [id number] Patient Active Problem List   Diagnosis Date Noted  . Supervision of normal pregnancy 04/21/2016  . Pyosalpinx 01/07/2015  . PID (acute pelvic inflammatory disease) 01/07/2015  . Asthma, mild intermittent 05/22/2014    Informal ultrasound performed to estimate GA as patient is unsure of LMP & continued to have vaginal bleeding until last month.    Plan:  1.  Supervision of other normal pregnancy, antepartum  - ABO AND RH  - Antibody screen; Future - HIV antibody (with reflex) - Hepatitis B Surface AntiGEN - Rubella screen - RPR  2. Mild intermittent asthma without complication -If symptoms not managed appropriately pt will need to f/u with PCP - albuterol (PROVENTIL HFA;VENTOLIN HFA) 108 (90 Base) MCG/ACT inhaler; Inhale 2 puffs into the lungs every 6 (six) hours as needed for wheezing or shortness of breath.  Dispense: 1 Inhaler; Refill: 2 - cetirizine (ZYRTEC ALLERGY) 10 MG tablet; Take 1 tablet (10 mg total) by mouth daily.  Dispense: 30 tablet; Refill: 6  3. Nausea/vomiting in pregnancy  - promethazine (PHENERGAN) 25 MG tablet; Take 1 tablet (25 mg total) by mouth every 6 (six) hours as needed for nausea or vomiting.  Dispense: 30 tablet; Refill: 0  4. Encounter to determine fetal viability of pregnancy, single or unspecified fetus -informal by Diane Day to estimate GA - US OB  Limited  5. Date of last menstrual period (LMP) unknown -Confirm dating. Patient will need cervical length at 16 wks d/t hx of 2nd trimester loss. Once we have confirmed dating, will order TVUS.  - US OB Comp Less 14 Wks; Future    Initial labs drawn. Prenatal vitamins. Problem list reviewed and updated.  Ultrasound discussed; fetal survey: ordered.  Follow up in 4 weeks.    Ana [name] 04/21/2016

## 2016-04-21 NOTE — Patient Instructions (Signed)
Safe Medications in Pregnancy   Acne: Benzoyl Peroxide Salicylic Acid  Backache/Headache: Tylenol: 2 regular strength every 4 hours OR              2 Extra strength every 6 hours  Colds/Coughs/Allergies: Benadryl (alcohol free) 25 mg every 6 hours as needed Breath right strips Claritin Cepacol throat lozenges Chloraseptic throat spray Cold-Eeze- up to three times per day Cough drops, alcohol free Flonase (by prescription only) Guaifenesin Mucinex Robitussin DM (plain only, alcohol free) Saline nasal spray/drops Sudafed (pseudoephedrine) & Actifed ** use only after [redacted] weeks gestation and if you do not have high blood pressure Tylenol Vicks Vaporub Zinc lozenges Zyrtec   Constipation: Colace Ducolax suppositories Fleet enema Glycerin suppositories Metamucil Milk of magnesia Miralax Senokot Smooth move tea  Diarrhea: Kaopectate Imodium A-D  *NO pepto Bismol  Hemorrhoids: Anusol Anusol HC Preparation H Tucks  Indigestion: Tums Maalox Mylanta Zantac  Pepcid  Insomnia: Benadryl (alcohol free) 25mg  every 6 hours as needed Tylenol PM Unisom, no Gelcaps  Leg Cramps: Tums MagGel  Nausea/Vomiting:  Bonine Dramamine Emetrol Ginger extract Sea bands Meclizine  Nausea medication to take during pregnancy:  Unisom (doxylamine succinate 25 mg tablets) Take one tablet daily at bedtime. If symptoms are not adequately controlled, the dose can be increased to a maximum recommended dose of two tablets daily (1/2 tablet in the morning, 1/2 tablet mid-afternoon and one at bedtime). Vitamin B6 100mg  tablets. Take one tablet twice a day (up to 200 mg per day).  Skin Rashes: Aveeno products Benadryl cream or 25mg  every 6 hours as needed Calamine Lotion 1% cortisone cream  Yeast infection: Gyne-lotrimin 7 Monistat 7  Gum/tooth pain: Anbesol  **If taking multiple medications, please check labels to avoid duplicating the same active ingredients **take  medication as directed on the label ** Do not exceed 4000 mg of tylenol in 24 hours **Do not take medications that contain aspirin or ibuprofen    Second Trimester of Pregnancy The second trimester is from week 13 through week 28 (months 4 through 6). The second trimester is often a time when you feel your best. Your body has also adjusted to being pregnant, and you begin to feel better physically. Usually, morning sickness has lessened or quit completely, you may have more energy, and you may have an increase in appetite. The second trimester is also a time when the fetus is growing rapidly. At the end of the sixth month, the fetus is about 9 inches long and weighs about 1 pounds. You will likely begin to feel the baby move (quickening) between 18 and 20 weeks of the pregnancy. Body changes during your second trimester Your body continues to go through many changes during your second trimester. The changes vary from woman to woman.  Your weight will continue to increase. You will notice your lower abdomen bulging out.  You may begin to get stretch marks on your hips, abdomen, and breasts.  You may develop headaches that can be relieved by medicines. The medicines should be approved by your health care provider.  You may urinate more often because the fetus is pressing on your bladder.  You may develop or continue to have heartburn as a result of your pregnancy.  You may develop constipation because certain hormones are causing the muscles that push waste through your intestines to slow down.  You may develop hemorrhoids or swollen, bulging veins (varicose veins).  You may have back pain. This is caused by:  Weight gain.  Pregnancy hormones that are relaxing the joints in your pelvis.  A shift in weight and the muscles that support your balance.  Your breasts will continue to grow and they will continue to become tender.  Your gums may bleed and may be sensitive to brushing and  flossing.  Dark spots or blotches (chloasma, mask of pregnancy) may develop on your face. This will likely fade after the baby is born.  A dark line from your belly button to the pubic area (linea nigra) may appear. This will likely fade after the baby is born.  You may have changes in your hair. These can include thickening of your hair, rapid growth, and changes in texture. Some women also have hair loss during or after pregnancy, or hair that feels dry or thin. Your hair will most likely return to normal after your baby is born. What to expect at prenatal visits During a routine prenatal visit:  You will be weighed to make sure you and the fetus are growing normally.  Your blood pressure will be taken.  Your abdomen will be measured to track your baby's growth.  The fetal heartbeat will be listened to.  Any test results from the previous visit will be discussed. Your health care provider may ask you:  How you are feeling.  If you are feeling the baby move.  If you have had any abnormal symptoms, such as leaking fluid, bleeding, severe headaches, or abdominal cramping.  If you are using any tobacco products, including cigarettes, chewing tobacco, and electronic cigarettes.  If you have any questions. Other tests that may be performed during your second trimester include:  Blood tests that check for:  Low iron levels (anemia).  Gestational diabetes (between 24 and 28 weeks).  Rh antibodies. This is to check for a protein on red blood cells (Rh factor).  Urine tests to check for infections, diabetes, or protein in the urine.  An ultrasound to confirm the proper growth and development of the baby.  An amniocentesis to check for possible genetic problems.  Fetal screens for spina bifida and Down syndrome.  HIV (human immunodeficiency virus) testing. Routine prenatal testing includes screening for HIV, unless you choose not to have this test. Follow these instructions at  home: Eating and drinking  Continue to eat regular, healthy meals.  Avoid raw meat, uncooked cheese, cat litter boxes, and soil used by cats. These carry germs that can cause birth defects in the baby.  Take your prenatal vitamins.  Take 1500-2000 mg of calcium daily starting at the 20th week of pregnancy until you deliver your baby.  If you develop constipation:  Take over-the-counter or prescription medicines.  Drink enough fluid to keep your urine clear or pale yellow.  Eat foods that are high in fiber, such as fresh fruits and vegetables, whole grains, and beans.  Limit foods that are high in fat and processed sugars, such as fried and sweet foods. Activity  Exercise only as directed by your health care provider. Experiencing uterine cramps is a good sign to stop exercising.  Avoid heavy lifting, wear low heel shoes, and practice good posture.  Wear your seat belt at all times when driving.  Rest with your legs elevated if you have leg cramps or low back pain.  Wear a good support bra for breast tenderness.  Do not use hot tubs, steam rooms, or saunas. Lifestyle  Avoid all smoking, herbs, alcohol, and unprescribed drugs. These chemicals affect the formation and growth of  the baby.  Do not use any products that contain nicotine or tobacco, such as cigarettes and e-cigarettes. If you need help quitting, ask your health care provider.  A sexual relationship may be continued unless your health care provider directs you otherwise. General instructions  Follow your health care provider's instructions regarding medicine use. There are medicines that are either safe or unsafe to take during pregnancy.  Take warm sitz baths to soothe any pain or discomfort caused by hemorrhoids. Use hemorrhoid cream if your health care provider approves.  If you develop varicose veins, wear support hose. Elevate your feet for 15 minutes, 3-4 times a day. Limit salt in your diet.  Visit your  dentist if you have not gone yet during your pregnancy. Use a soft toothbrush to brush your teeth and be gentle when you floss.  Keep all follow-up prenatal visits as told by your health care provider. This is important. Contact a health care provider if:  You have dizziness.  You have mild pelvic cramps, pelvic pressure, or nagging pain in the abdominal area.  You have persistent nausea, vomiting, or diarrhea.  You have a bad smelling vaginal discharge.  You have pain with urination. Get help right away if:  You have a fever.  You are leaking fluid from your vagina.  You have spotting or bleeding from your vagina.  You have severe abdominal cramping or pain.  You have rapid weight gain or weight loss.  You have shortness of breath with chest pain.  You notice sudden or extreme swelling of your face, hands, ankles, feet, or legs.  You have not felt your baby move in over an hour.  You have severe headaches that do not go away with medicine.  You have vision changes. Summary  The second trimester is from week 13 through week 28 (months 4 through 6). It is also a time when the fetus is growing rapidly.  Your body goes through many changes during pregnancy. The changes vary from woman to woman.  Avoid all smoking, herbs, alcohol, and unprescribed drugs. These chemicals affect the formation and growth your baby.  Do not use any tobacco products, such as cigarettes, chewing tobacco, and e-cigarettes. If you need help quitting, ask your health care provider.  Contact your health care provider if you have any questions. Keep all prenatal visits as told by your health care provider. This is important. This information is not intended to replace advice given to you by your health care provider. Make sure you discuss any questions you have with your health care provider. Document Released: 02/17/2001 Document Revised: 08/01/2015 Document Reviewed: 04/26/2012 Elsevier Interactive  Patient Education  2017 [employer].

## 2016-04-22 ENCOUNTER — Inpatient Hospital Stay (HOSPITAL_COMMUNITY)
Admission: AD | Admit: 2016-04-22 | Discharge: 2016-04-22 | Disposition: A | Discharge status: Home / Self Care | Payer: Medicaid Other | Source: Ambulatory Visit | Attending: Obstetrics and Gynecology | Admitting: Obstetrics and Gynecology

## 2016-04-22 ENCOUNTER — Encounter (HOSPITAL_COMMUNITY): Payer: Self-pay

## 2016-04-22 DIAGNOSIS — Z87891 Personal history of nicotine dependence: Secondary | ICD-10-CM | POA: Insufficient documentation

## 2016-04-22 DIAGNOSIS — R1084 Generalized abdominal pain: Secondary | ICD-10-CM | POA: Diagnosis not present

## 2016-04-22 DIAGNOSIS — R102 Pelvic and perineal pain: Secondary | ICD-10-CM | POA: Diagnosis present

## 2016-04-22 DIAGNOSIS — Z3A12 12 weeks gestation of pregnancy: Secondary | ICD-10-CM | POA: Insufficient documentation

## 2016-04-22 DIAGNOSIS — Z3480 Encounter for supervision of other normal pregnancy, unspecified trimester: Secondary | ICD-10-CM

## 2016-04-22 DIAGNOSIS — O26892 Other specified pregnancy related conditions, second trimester: Secondary | ICD-10-CM

## 2016-04-22 DIAGNOSIS — O26891 Other specified pregnancy related conditions, first trimester: Secondary | ICD-10-CM | POA: Insufficient documentation

## 2016-04-22 LAB — URINALYSIS, ROUTINE W REFLEX MICROSCOPIC
Bilirubin Urine: NEGATIVE
Glucose, UA: NEGATIVE mg/dL
Hgb urine dipstick: NEGATIVE
KETONES UR: NEGATIVE mg/dL
Nitrite: NEGATIVE
PROTEIN: 30 mg/dL — AB
Specific Gravity, Urine: 1.034 — ABNORMAL HIGH (ref 1.005–1.030)
pH: 6 (ref 5.0–8.0)

## 2016-04-22 LAB — HEPATITIS B SURFACE ANTIGEN: HEP B S AG: NEGATIVE

## 2016-04-22 LAB — ANTIBODY SCREEN: Antibody Screen: NEGATIVE

## 2016-04-22 LAB — ABO AND RH: RH TYPE: POSITIVE

## 2016-04-22 LAB — HIV ANTIBODY (ROUTINE TESTING W REFLEX): HIV: NONREACTIVE

## 2016-04-22 LAB — RUBELLA SCREEN: Rubella: 5.36 Index — ABNORMAL HIGH (ref <0.90)

## 2016-04-22 LAB — RPR

## 2016-04-22 MED ORDER — IBUPROFEN 800 MG PO TABS
800.0000 mg | ORAL_TABLET | Freq: Once | ORAL | Status: AC
  Administered 2016-04-22: 800 mg via ORAL
  Filled 2016-04-22: qty 1

## 2016-04-22 MED ORDER — OXYCODONE-ACETAMINOPHEN 5-325 MG PO TABS
2.0000 | ORAL_TABLET | Freq: Once | ORAL | Status: AC
  Administered 2016-04-22: 2 via ORAL
  Filled 2016-04-22: qty 2

## 2016-04-22 NOTE — MAU Provider Note (Signed)
[name] is a 26 year old [id number] at 16 weeks and 1 day by uncertain LMP. She found out that she was pregnant in early January but has not made a prenatal visit. She did not want to come to MAU today because the last time she was in MAU she lost a baby and she has also been admitted for PID and  hydrosalpinx in the past. Patient also listed chest pain on her complaint when she logged in but states that the chest pain is now resolved and that it comes and goes when she is pregnant.    History     CSN: [id number]  Arrival date and time: 04/22/16 1934   None     Chief Complaint  Patient presents with  . Pelvic Pain   Back Pain  This is a new problem. The current episode started in the past 7 days. The problem occurs constantly. The problem has been gradually worsening since onset. The pain is present in the lumbar spine. The quality of the pain is described as cramping and stabbing. The pain does not radiate. The pain is at a severity of 8/10. The pain is worse during the day (it is worse after she throws up in the morning). Associated symptoms include abdominal pain and pelvic pain. Pertinent negatives include no bladder incontinence, bowel incontinence, chest pain, dysuria, fever, headaches, leg pain, numbness, paresis, paresthesias, perianal numbness, tingling, weakness or weight loss. Risk factors include pregnancy (HX of PID).  Abdominal Pain  This is a chronic problem. The current episode started more than 1 month ago. The onset quality is gradual. The problem occurs constantly. The problem has been unchanged. The pain is located in the suprapubic region (most on the left side). The pain is at a severity of 8/10. The pain is severe. The quality of the pain is cramping. The abdominal pain does not radiate. Associated symptoms include vomiting. Pertinent negatives include no anorexia, arthralgias, belching, constipation, diarrhea, dysuria, fever, headaches, melena, myalgias, nausea or  weight loss. Associated symptoms comments: Vomiting due to morning sickness. Nothing aggravates the pain. The pain is relieved by nothing.  Pelvic Pain  The patient's primary symptoms include pelvic pain. Associated symptoms include abdominal pain, back pain and vomiting. Pertinent negatives include no anorexia, constipation, diarrhea, dysuria, fever, headaches or nausea.    OB History    Gravida Para Term Preterm AB Living   8 5 5  0 2 5   SAB TAB Ectopic Multiple Live Births   2 0 0 0 5      Past Medical History:  Diagnosis Date  . Abnormal Pap smear   . Asthma   . Chlamydia Jan 2014  . Kidney infection   . Vaginal Pap smear, abnormal     Past Surgical History:  Procedure Laterality Date  . WISDOM TOOTH EXTRACTION      Family History  Problem Relation Age of Onset  . Hypertension Mother   . Hypertension Maternal Grandmother     Social History  Substance Use Topics  . Smoking status: Former Smoker    Quit date: 04/25/2014  . Smokeless tobacco: Never Used  . Alcohol use No     Comment: occasional/not since pregnancy    Allergies: No Known Allergies  Prescriptions Prior to Admission  Medication Sig Dispense Refill Last Dose  . albuterol (PROVENTIL HFA;VENTOLIN HFA) 108 (90 Base) MCG/ACT inhaler Inhale 2 puffs into the lungs every 6 (six) hours as needed for wheezing or shortness of breath.  1 Inhaler 2 04/22/2016 at Unknown time  . ALPRAZolam (XANAX) 1 MG tablet Take 1 mg by mouth 3 (three) times daily as needed for anxiety.   Past Month at Unknown time  . cetirizine (ZYRTEC ALLERGY) 10 MG tablet Take 1 tablet (10 mg total) by mouth daily. 30 tablet 6 04/22/2016 at Unknown time  . metroNIDAZOLE (FLAGYL) 500 MG tablet Take 1 tablet (500 mg total) by mouth 3 (three) times daily. 21 tablet 0 04/22/2016 at Unknown time  . ondansetron (ZOFRAN) 8 MG tablet Take 1 tablet (8 mg total) by mouth every 8 (eight) hours as needed for nausea or vomiting. 10 tablet 0 Past Week at Unknown  time  . oxyCODONE-acetaminophen (PERCOCET/ROXICET) 5-325 MG tablet Take 1 tablet by mouth every 4 (four) hours as needed for severe pain. 20 tablet 0 04/22/2016 at Unknown time  . promethazine (PHENERGAN) 25 MG tablet Take 1 tablet (25 mg total) by mouth every 6 (six) hours as needed for nausea or vomiting. 30 tablet 0 04/21/2016 at Unknown time  . oxyCODONE (ROXICODONE) 15 MG immediate release tablet Take 1 tablet (15 mg total) by mouth every 6 (six) hours as needed for severe pain. (Patient not taking: Reported on 04/22/2016) 56 tablet 0 Not Taking at Unknown time    Review of Systems  Constitutional: Negative for fever and weight loss.  Cardiovascular: Negative for chest pain.  Gastrointestinal: Positive for abdominal pain and vomiting. Negative for anorexia, bowel incontinence, constipation, diarrhea, melena and nausea.  Genitourinary: Positive for pelvic pain. Negative for bladder incontinence and dysuria.  Musculoskeletal: Positive for back pain. Negative for arthralgias and myalgias.  Neurological: Negative for tingling, weakness, numbness, headaches and paresthesias.   Physical Exam   Last menstrual period 12/29/2015, unknown if currently breastfeeding.  Physical Exam  Constitutional: She is oriented to person, place, and time. She appears well-developed and well-nourished.  HENT:  Head: Normocephalic.  Eyes: Pupils are equal, round, and reactive to light.  Neck: Normal range of motion.  Cardiovascular: Normal rate.   Respiratory: Effort normal and breath sounds normal.  GI: Soft. Bowel sounds are normal.  Genitourinary:  Genitourinary Comments: NEFG. No lesions on the vaginal walls. Cervix is pink with no lesions; scant white discharge in the vagina. No CMT; tender over left adnexa.   Musculoskeletal: Normal range of motion.  Neurological: She is alert and oriented to person, place, and time.  Skin: Skin is warm and dry.    MAU Course  Procedures  MDM -UA: negative for  signs of dehydration -UC pending -GC CT cultures pending -wet prep: positive for clue -[place] negative for torsion or cysts Assessment and Plan   1. Generalized abdominal pain   2. Supervision of other normal pregnancy, antepartum    2. Patient to be discharged home with RX for percocet and instructions to return to the MAU if the pain becomes worse.   3.   Message sent to the [facility] to call for prenatal appointment.   Luna Kitchens CNM  Charlesetta Garibaldi Cheril Slattery 04/22/2016, 9:55 PM

## 2016-04-22 NOTE — MAU Provider Note (Signed)
History     CSN: [id number]  Arrival date and time: 04/22/16 4098   First Provider Initiated Contact with Patient 04/22/16 2053      Chief Complaint  Patient presents with  . Pelvic Pain   Ana [name] is a 26 y.o. [id number] at [id number] who presents today with lower abdominal pain. She was seen for this same pain on 04/20/16, and had a pelvic US that was normal. She was seen in clinic on 2/13 and had an [place] that showed IUP with +cardiac activity. She is scheduled for complete OB US on 04/27/16.   Pelvic Pain  The patient's primary symptoms include pelvic pain. The patient's pertinent negatives include no vaginal discharge. This is a new problem. The current episode started yesterday. The problem occurs constantly. The problem has been unchanged. Pain severity now: 10/10. The problem affects the right side. She is pregnant. Pertinent negatives include no chills, dysuria, fever, frequency, nausea, urgency or vomiting. The vaginal discharge was normal. There has been no bleeding. Nothing aggravates the symptoms. She has tried oral narcotics for the symptoms. The treatment provided mild relief. Menstrual history: LMP 12/29/15     Past Medical History:  Diagnosis Date  . Abnormal Pap smear   . Asthma   . Chlamydia Jan 2014  . Kidney infection   . Vaginal Pap smear, abnormal     Past Surgical History:  Procedure Laterality Date  . WISDOM TOOTH EXTRACTION      Family History  Problem Relation Age of Onset  . Hypertension Mother   . Hypertension Maternal Grandmother     Social History  Substance Use Topics  . Smoking status: Former Smoker    Quit date: 04/25/2014  . Smokeless tobacco: Never Used  . Alcohol use No     Comment: occasional/not since pregnancy    Allergies: No Known Allergies  Prescriptions Prior to Admission  Medication Sig Dispense Refill Last Dose  . albuterol (PROVENTIL HFA;VENTOLIN HFA) 108 (90 Base) MCG/ACT inhaler Inhale 2 puffs into the lungs every 6 (six)  hours as needed for wheezing or shortness of breath. 1 Inhaler 2 04/22/2016 at Unknown time  . ALPRAZolam (XANAX) 1 MG tablet Take 1 mg by mouth 3 (three) times daily as needed for anxiety.   Past Month at Unknown time  . cetirizine (ZYRTEC ALLERGY) 10 MG tablet Take 1 tablet (10 mg total) by mouth daily. 30 tablet 6 04/22/2016 at Unknown time  . metroNIDAZOLE (FLAGYL) 500 MG tablet Take 1 tablet (500 mg total) by mouth 3 (three) times daily. 21 tablet 0 04/22/2016 at Unknown time  . ondansetron (ZOFRAN) 8 MG tablet Take 1 tablet (8 mg total) by mouth every 8 (eight) hours as needed for nausea or vomiting. 10 tablet 0 Past Week at Unknown time  . oxyCODONE-acetaminophen (PERCOCET/ROXICET) 5-325 MG tablet Take 1 tablet by mouth every 4 (four) hours as needed for severe pain. 20 tablet 0 04/22/2016 at Unknown time  . promethazine (PHENERGAN) 25 MG tablet Take 1 tablet (25 mg total) by mouth every 6 (six) hours as needed for nausea or vomiting. 30 tablet 0 04/21/2016 at Unknown time  . oxyCODONE (ROXICODONE) 15 MG immediate release tablet Take 1 tablet (15 mg total) by mouth every 6 (six) hours as needed for severe pain. (Patient not taking: Reported on 04/22/2016) 56 tablet 0 Not Taking at Unknown time    Review of Systems  Constitutional: Negative for chills and fever.  Gastrointestinal: Negative for nausea and vomiting.  Genitourinary: Positive  for pelvic pain. Negative for dysuria, frequency, urgency, vaginal bleeding and vaginal discharge.   Physical Exam   Last menstrual period 12/29/2015, unknown if currently breastfeeding.  Physical Exam  Nursing note and vitals reviewed. Constitutional: She is oriented to person, place, and time. She appears well-developed and well-nourished. No distress.  HENT:  Head: Normocephalic.  Cardiovascular: Normal rate.   Respiratory: Effort normal.  GI: Soft. There is no tenderness. There is no rebound.  Genitourinary:  Genitourinary Comments:  External: no  lesion Vagina: small amount of white discharge Cervix: pink, smooth, no CMT, closed Uterus: AGA, FHT 152 with doppler  Adnexa: NT   Neurological: She is alert and oriented to person, place, and time.  Skin: Skin is warm and dry.  Psychiatric: She has a normal mood and affect.   Results for orders placed or performed during the hospital encounter of 04/22/16 (from the past 24 hour(s))  Urinalysis, Routine w reflex microscopic     Status: Abnormal   Collection Time: 04/22/16  7:40 PM  Result Value Ref Range   Color, Urine YELLOW YELLOW   APPearance CLOUDY (A) CLEAR   Specific Gravity, Urine 1.034 (H) 1.005 - 1.030   pH 6.0 5.0 - 8.0   Glucose, UA NEGATIVE NEGATIVE mg/dL   Hgb urine dipstick NEGATIVE NEGATIVE   Bilirubin Urine NEGATIVE NEGATIVE   Ketones, ur NEGATIVE NEGATIVE mg/dL   Protein, ur 30 (A) NEGATIVE mg/dL   Nitrite NEGATIVE NEGATIVE   Leukocytes, UA SMALL (A) NEGATIVE   RBC / HPF 0-5 0 - 5 RBC/hpf   WBC, UA 0-5 0 - 5 WBC/hpf   Bacteria, UA RARE (A) NONE SEEN   Squamous Epithelial / LPF 6-30 (A) NONE SEEN   Mucous PRESENT     MAU Course  Procedures  MDM Attempted to review US results from yesterday with the patient. She became angry, and tearful. She states that the US yesterday was not normal. I was attempting to reassure the patient that the US showed a viable IUP, and at this time this is good news. She again became tearful, and stated "you are trying to make me lose my baby". Reassurance provided. She asked to see supervisor.  Supervisor spoke with patient. Reassurance provided. Patient consents to a pelvic exam now.   Patient has had ibuprofen and percocet for pain.   Assessment and Plan   1. Generalized abdominal pain   2. Supervision of other normal pregnancy, antepartum   3. Pelvic pain affecting pregnancy in second trimester, antepartum   4. [redacted] weeks gestation of pregnancy    DC home Comfort measures reviewed  2nd Trimester precautions  RX: none  continue meds as already prescribed.  Return to MAU as needed FU with OB as planned  Follow-up Information    [facility]   Specialty:  Obstetrics and Gynecology Contact information: [address] [place] [id number] [phone]           Tawnya CrookHogan, Ester Hilley Donovan 04/22/2016, 8:54 PM

## 2016-04-22 NOTE — Discharge Instructions (Signed)
Second Trimester of Pregnancy The second trimester is from week 13 through week 28 (months 4 through 6). The second trimester is often a time when you feel your best. Your body has also adjusted to being pregnant, and you begin to feel better physically. Usually, morning sickness has lessened or quit completely, you may have more energy, and you may have an increase in appetite. The second trimester is also a time when the fetus is growing rapidly. At the end of the sixth month, the fetus is about 9 inches long and weighs about 1 pounds. You will likely begin to feel the baby move (quickening) between 18 and 20 weeks of the pregnancy. Body changes during your second trimester Your body continues to go through many changes during your second trimester. The changes vary from woman to woman.  Your weight will continue to increase. You will notice your lower abdomen bulging out.  You may begin to get stretch marks on your hips, abdomen, and breasts.  You may develop headaches that can be relieved by medicines. The medicines should be approved by your health care provider.  You may urinate more often because the fetus is pressing on your bladder.  You may develop or continue to have heartburn as a result of your pregnancy.  You may develop constipation because certain hormones are causing the muscles that push waste through your intestines to slow down.  You may develop hemorrhoids or swollen, bulging veins (varicose veins).  You may have back pain. This is caused by:  Weight gain.  Pregnancy hormones that are relaxing the joints in your pelvis.  A shift in weight and the muscles that support your balance.  Your breasts will continue to grow and they will continue to become tender.  Your gums may bleed and may be sensitive to brushing and flossing.  Dark spots or blotches (chloasma, mask of pregnancy) may develop on your face. This will likely fade after the baby is born.  A dark line  from your belly button to the pubic area (linea nigra) may appear. This will likely fade after the baby is born.  You may have changes in your hair. These can include thickening of your hair, rapid growth, and changes in texture. Some women also have hair loss during or after pregnancy, or hair that feels dry or thin. Your hair will most likely return to normal after your baby is born. What to expect at prenatal visits During a routine prenatal visit:  You will be weighed to make sure you and the fetus are growing normally.  Your blood pressure will be taken.  Your abdomen will be measured to track your baby's growth.  The fetal heartbeat will be listened to.  Any test results from the previous visit will be discussed. Your health care provider may ask you:  How you are feeling.  If you are feeling the baby move.  If you have had any abnormal symptoms, such as leaking fluid, bleeding, severe headaches, or abdominal cramping.  If you are using any tobacco products, including cigarettes, chewing tobacco, and electronic cigarettes.  If you have any questions. Other tests that may be performed during your second trimester include:  Blood tests that check for:  Low iron levels (anemia).  Gestational diabetes (between 24 and 28 weeks).  Rh antibodies. This is to check for a protein on red blood cells (Rh factor).  Urine tests to check for infections, diabetes, or protein in the urine.  An ultrasound to   confirm the proper growth and development of the baby.  An amniocentesis to check for possible genetic problems.  Fetal screens for spina bifida and Down syndrome.  HIV (human immunodeficiency virus) testing. Routine prenatal testing includes screening for HIV, unless you choose not to have this test. Follow these instructions at home: Eating and drinking  Continue to eat regular, healthy meals.  Avoid raw meat, uncooked cheese, cat litter boxes, and soil used by cats. These  carry germs that can cause birth defects in the baby.  Take your prenatal vitamins.  Take 1500-2000 mg of calcium daily starting at the 20th week of pregnancy until you deliver your baby.  If you develop constipation:  Take over-the-counter or prescription medicines.  Drink enough fluid to keep your urine clear or pale yellow.  Eat foods that are high in fiber, such as fresh fruits and vegetables, whole grains, and beans.  Limit foods that are high in fat and processed sugars, such as fried and sweet foods. Activity  Exercise only as directed by your health care provider. Experiencing uterine cramps is a good sign to stop exercising.  Avoid heavy lifting, wear low heel shoes, and practice good posture.  Wear your seat belt at all times when driving.  Rest with your legs elevated if you have leg cramps or low back pain.  Wear a good support bra for breast tenderness.  Do not use hot tubs, steam rooms, or saunas. Lifestyle  Avoid all smoking, herbs, alcohol, and unprescribed drugs. These chemicals affect the formation and growth of the baby.  Do not use any products that contain nicotine or tobacco, such as cigarettes and e-cigarettes. If you need help quitting, ask your health care provider.  A sexual relationship may be continued unless your health care provider directs you otherwise. General instructions  Follow your health care provider's instructions regarding medicine use. There are medicines that are either safe or unsafe to take during pregnancy.  Take warm sitz baths to soothe any pain or discomfort caused by hemorrhoids. Use hemorrhoid cream if your health care provider approves.  If you develop varicose veins, wear support hose. Elevate your feet for 15 minutes, 3-4 times a day. Limit salt in your diet.  Visit your dentist if you have not gone yet during your pregnancy. Use a soft toothbrush to brush your teeth and be gentle when you floss.  Keep all follow-up  prenatal visits as told by your health care provider. This is important. Contact a health care provider if:  You have dizziness.  You have mild pelvic cramps, pelvic pressure, or nagging pain in the abdominal area.  You have persistent nausea, vomiting, or diarrhea.  You have a bad smelling vaginal discharge.  You have pain with urination. Get help right away if:  You have a fever.  You are leaking fluid from your vagina.  You have spotting or bleeding from your vagina.  You have severe abdominal cramping or pain.  You have rapid weight gain or weight loss.  You have shortness of breath with chest pain.  You notice sudden or extreme swelling of your face, hands, ankles, feet, or legs.  You have not felt your baby move in over an hour.  You have severe headaches that do not go away with medicine.  You have vision changes. Summary  The second trimester is from week 13 through week 28 (months 4 through 6). It is also a time when the fetus is growing rapidly.  Your body goes   through many changes during pregnancy. The changes vary from woman to woman.  Avoid all smoking, herbs, alcohol, and unprescribed drugs. These chemicals affect the formation and growth your baby.  Do not use any tobacco products, such as cigarettes, chewing tobacco, and e-cigarettes. If you need help quitting, ask your health care provider.  Contact your health care provider if you have any questions. Keep all prenatal visits as told by your health care provider. This is important. This information is not intended to replace advice given to you by your health care provider. Make sure you discuss any questions you have with your health care provider. Document Released: 02/17/2001 Document Revised: 08/01/2015 Document Reviewed: 04/26/2012 Elsevier Interactive Patient Education  2017 [employer]  

## 2016-04-22 NOTE — MAU Note (Signed)
Pt presents complaining of pain on the lower right side of her abdomen for 4 hours. States it happened last time she was here on the other side of her abdomen and she tried the percocet but it isn't helping her pain. The pain comes and goes. Denies vaginal bleeding or discharge.

## 2016-04-24 LAB — CULTURE, OB URINE

## 2016-04-27 ENCOUNTER — Ambulatory Visit (HOSPITAL_COMMUNITY)
Admission: RE | Admit: 2016-04-27 | Discharge: 2016-04-27 | Disposition: A | Discharge status: Home / Self Care | Payer: Medicaid Other | Source: Ambulatory Visit | Attending: Student | Admitting: Student

## 2016-04-27 DIAGNOSIS — Z3689 Encounter for other specified antenatal screening: Secondary | ICD-10-CM | POA: Insufficient documentation

## 2016-04-27 DIAGNOSIS — Z3A13 13 weeks gestation of pregnancy: Secondary | ICD-10-CM | POA: Diagnosis not present

## 2016-04-27 DIAGNOSIS — Z789 Other specified health status: Secondary | ICD-10-CM

## 2016-04-28 ENCOUNTER — Telehealth: Payer: Self-pay | Admitting: *Deleted

## 2016-04-28 NOTE — Telephone Encounter (Signed)
Pt left message requesting US results from yesterday.

## 2016-04-30 NOTE — Telephone Encounter (Signed)
Addendum:  I can not find any makena paperwork [facility] I called her back and left a message we need her to come in to office asap and fill out the paperwork we discussed.

## 2016-04-30 NOTE — Telephone Encounter (Signed)
I called Ana [name] back and reviewed her us results with her.  I also reminded her of her next ob appt. She also states she was told she needs to start her 17p before then.  Upon review I informed her at her next ob appt she will 16w and a few days and that is the perect time to start the 17p. She voices understanding. She states [facility]

## 2016-04-30 NOTE — Telephone Encounter (Signed)
Addendum:   I have the paperwork and have verified with Makena they received paperwork and will be sending her 17P.  I called  Greysen back and notified her we have the paperwork and it has been sent in and we will start her injections at her next visit. She voices understanding.

## 2016-05-20 ENCOUNTER — Ambulatory Visit: Payer: Self-pay

## 2016-05-20 ENCOUNTER — Encounter: Payer: Self-pay | Admitting: Advanced Practice Midwife

## 2016-05-21 ENCOUNTER — Ambulatory Visit (INDEPENDENT_AMBULATORY_CARE_PROVIDER_SITE_OTHER): Payer: Medicaid Other | Admitting: Obstetrics & Gynecology

## 2016-05-21 VITALS — BP 117/53 | HR 63 | Wt 171.0 lb

## 2016-05-21 DIAGNOSIS — O09219 Supervision of pregnancy with history of pre-term labor, unspecified trimester: Secondary | ICD-10-CM

## 2016-05-21 DIAGNOSIS — O09212 Supervision of pregnancy with history of pre-term labor, second trimester: Secondary | ICD-10-CM | POA: Diagnosis not present

## 2016-05-21 DIAGNOSIS — O09899 Supervision of other high risk pregnancies, unspecified trimester: Secondary | ICD-10-CM

## 2016-05-21 DIAGNOSIS — Z3480 Encounter for supervision of other normal pregnancy, unspecified trimester: Secondary | ICD-10-CM

## 2016-05-21 DIAGNOSIS — Z8759 Personal history of other complications of pregnancy, childbirth and the puerperium: Secondary | ICD-10-CM | POA: Insufficient documentation

## 2016-05-21 DIAGNOSIS — Z8751 Personal history of pre-term labor: Secondary | ICD-10-CM | POA: Insufficient documentation

## 2016-05-21 MED ORDER — HYDROXYPROGESTERONE CAPROATE 250 MG/ML IM OIL
250.0000 mg | TOPICAL_OIL | INTRAMUSCULAR | Status: DC
  Administered 2016-05-21 – 2016-09-28 (×9): 250 mg via INTRAMUSCULAR

## 2016-05-21 NOTE — Patient Instructions (Signed)
Second Trimester of Pregnancy The second trimester is from week 13 through week 28, month 4 through 6. This is often the time in pregnancy that you feel your best. Often times, morning sickness has lessened or quit. You may have more energy, and you may get hungry more often. Your unborn baby (fetus) is growing rapidly. At the end of the sixth month, he or she is about 9 inches long and weighs about 1 pounds. You will likely feel the baby move (quickening) between 18 and 20 weeks of pregnancy. Follow these instructions at home:  Avoid all smoking, herbs, and alcohol. Avoid drugs not approved by your doctor.  Do not use any tobacco products, including cigarettes, chewing tobacco, and electronic cigarettes. If you need help quitting, ask your doctor. You may get counseling or other support to help you quit.  Only take medicine as told by your doctor. Some medicines are safe and some are not during pregnancy.  Exercise only as told by your doctor. Stop exercising if you start having cramps.  Eat regular, healthy meals.  Wear a good support bra if your breasts are tender.  Do not use hot tubs, steam rooms, or saunas.  Wear your seat belt when driving.  Avoid raw meat, uncooked cheese, and liter boxes and soil used by cats.  Take your prenatal vitamins.  Take 1500-2000 milligrams of calcium daily starting at the 20th week of pregnancy until you deliver your baby.  Try taking medicine that helps you poop (stool softener) as needed, and if your doctor approves. Eat more fiber by eating fresh fruit, vegetables, and whole grains. Drink enough fluids to keep your pee (urine) clear or pale yellow.  Take warm water baths (sitz baths) to soothe pain or discomfort caused by hemorrhoids. Use hemorrhoid cream if your doctor approves.  If you have puffy, bulging veins (varicose veins), wear support hose. Raise (elevate) your feet for 15 minutes, 3-4 times a day. Limit salt in your diet.  Avoid heavy  lifting, wear low heals, and sit up straight.  Rest with your legs raised if you have leg cramps or low back pain.  Visit your dentist if you have not gone during your pregnancy. Use a soft toothbrush to brush your teeth. Be gentle when you floss.  You can have sex (intercourse) unless your doctor tells you not to.  Go to your doctor visits. Get help if:  You feel dizzy.  You have mild cramps or pressure in your lower belly (abdomen).  You have a nagging pain in your belly area.  You continue to feel sick to your stomach (nauseous), throw up (vomit), or have watery poop (diarrhea).  You have bad smelling fluid coming from your vagina.  You have pain with peeing (urination). Get help right away if:  You have a fever.  You are leaking fluid from your vagina.  You have spotting or bleeding from your vagina.  You have severe belly cramping or pain.  You lose or gain weight rapidly.  You have trouble catching your breath and have chest pain.  You notice sudden or extreme puffiness (swelling) of your face, hands, ankles, feet, or legs.  You have not felt the baby move in over an hour.  You have severe headaches that do not go away with medicine.  You have vision changes. This information is not intended to replace advice given to you by your health care provider. Make sure you discuss any questions you have with your health care   provider. Document Released: 05/20/2009 Document Revised: 08/01/2015 Document Reviewed: 04/26/2012 Elsevier Interactive Patient Education  2017 Elsevier Inc.  

## 2016-05-21 NOTE — Progress Notes (Signed)
Addendum: completed medicaid home form

## 2016-05-21 NOTE — Progress Notes (Signed)
   PRENATAL VISIT NOTE  Subjective:  Ana [name] is a 26 y.o. [id number] at 4455w6d being seen today for ongoing prenatal care.  She is currently monitored for the following issues for this high-risk pregnancy and has Asthma, mild intermittent; Supervision of normal pregnancy; and History of preterm delivery, currently pregnant on her problem list.  Patient reports nausea and vomiting.  Contractions: Not present. Vag. Bleeding: None.  Movement: Present. Denies leaking of fluid.   The following portions of the patient's history were reviewed and updated as appropriate: allergies, current medications, past family history, past medical history, past social history, past surgical history and problem list. Problem list updated.  Objective:   Vitals:   05/21/16 0903  BP: (!) 117/53  Pulse: 63  Weight: 171 lb (77.6 kg)    Fetal Status: Fetal Heart Rate (bpm): 145 Fundal Height: 17 cm Movement: Present     General:  Alert, oriented and cooperative. Patient is in no acute distress.  Skin: Skin is warm and dry. No rash noted.   Cardiovascular: Normal heart rate noted  Respiratory: Normal respiratory effort, no problems with respiration noted  Abdomen: Soft, gravid, appropriate for gestational age. Pain/Pressure: Present     Pelvic:  Cervical exam deferred        Extremities: Normal range of motion.  Edema: None  Mental Status: Normal mood and affect. Normal behavior. Normal judgment and thought content.   Assessment and Plan:  Pregnancy: [id number] at 8655w6d  1. Supervision of other normal pregnancy, antepartum h/o 19 week loss - hydroxyprogesterone caproate (MAKENA) 250 mg/mL injection 250 mg; Inject 1 mL (250 mg total) into the muscle once a week. - US MFM OB COMP + 14 WK; Future - AMB referral to maternal fetal medicine - AFP, Quad Screen  2. History of preterm delivery, currently pregnant MFM consult ordered  - hydroxyprogesterone caproate (MAKENA) 250 mg/mL injection 250 mg; Inject 1 mL  (250 mg total) into the muscle once a week. - US MFM OB COMP + 14 WK; Future  Preterm labor symptoms and general obstetric precautions including but not limited to vaginal bleeding, contractions, leaking of fluid and fetal movement were reviewed in detail with the patient. Please refer to After Visit Summary for other counseling recommendations.  Return in about 4 weeks (around 06/18/2016) for 17 p weekly .   Adam PhenixJames G Anah Billard, MD

## 2016-05-26 LAB — AFP, QUAD SCREEN
DIA Mom Value: 1.07
DIA Value (EIA): 172.74 pg/mL
DSR (By Age)    1 IN: 930
DSR (SECOND TRIMESTER) 1 IN: 7859
Gestational Age: 16.9 WEEKS
MATERNAL AGE AT EDD: 26.9 a
MSAFP MOM: 1.13
MSAFP: 42.4 ng/mL
MSHCG Mom: 1.34
MSHCG: 39931 m[IU]/mL
OSB RISK: 10000
T18 (By Age): 1:3625 {titer}
TEST RESULTS AFP: NEGATIVE
UE3 VALUE: 1.18 ng/mL
Weight: 171 [lb_av]
uE3 Mom: 1.19

## 2016-05-28 ENCOUNTER — Ambulatory Visit (INDEPENDENT_AMBULATORY_CARE_PROVIDER_SITE_OTHER): Payer: Medicaid Other | Admitting: General Practice

## 2016-05-28 VITALS — BP 115/51 | HR 73 | Ht 64.0 in | Wt 159.7 lb

## 2016-05-28 DIAGNOSIS — O09219 Supervision of pregnancy with history of pre-term labor, unspecified trimester: Principal | ICD-10-CM

## 2016-05-28 DIAGNOSIS — O09899 Supervision of other high risk pregnancies, unspecified trimester: Secondary | ICD-10-CM

## 2016-05-28 DIAGNOSIS — O09212 Supervision of pregnancy with history of pre-term labor, second trimester: Secondary | ICD-10-CM | POA: Diagnosis not present

## 2016-05-28 MED ORDER — PRENATAL VITAMINS 0.8 MG PO TABS: 1.0000 | ORAL_TABLET | Freq: Every day | ORAL | 2 refills | Status: DC

## 2016-05-28 NOTE — Addendum Note (Signed)
Addended by: Kathee DeltonHILLMAN, CARRIE L on: 05/28/2016 11:14 AM   Modules accepted: Orders

## 2016-06-04 ENCOUNTER — Ambulatory Visit (INDEPENDENT_AMBULATORY_CARE_PROVIDER_SITE_OTHER): Payer: Medicaid Other

## 2016-06-04 VITALS — BP 99/53 | HR 79

## 2016-06-04 DIAGNOSIS — O09213 Supervision of pregnancy with history of pre-term labor, third trimester: Secondary | ICD-10-CM

## 2016-06-04 DIAGNOSIS — O09293 Supervision of pregnancy with other poor reproductive or obstetric history, third trimester: Secondary | ICD-10-CM

## 2016-06-09 ENCOUNTER — Ambulatory Visit (HOSPITAL_COMMUNITY)
Admission: RE | Admit: 2016-06-09 | Discharge: 2016-06-09 | Disposition: A | Discharge status: Home / Self Care | Payer: Medicaid Other | Source: Ambulatory Visit | Attending: Obstetrics & Gynecology | Admitting: Obstetrics & Gynecology

## 2016-06-09 ENCOUNTER — Encounter (HOSPITAL_COMMUNITY): Payer: Self-pay

## 2016-06-09 DIAGNOSIS — Z3480 Encounter for supervision of other normal pregnancy, unspecified trimester: Secondary | ICD-10-CM

## 2016-06-09 DIAGNOSIS — O09892 Supervision of other high risk pregnancies, second trimester: Secondary | ICD-10-CM

## 2016-06-09 DIAGNOSIS — O09219 Supervision of pregnancy with history of pre-term labor, unspecified trimester: Secondary | ICD-10-CM | POA: Insufficient documentation

## 2016-06-09 DIAGNOSIS — Z3A19 19 weeks gestation of pregnancy: Secondary | ICD-10-CM | POA: Insufficient documentation

## 2016-06-09 DIAGNOSIS — O09212 Supervision of pregnancy with history of pre-term labor, second trimester: Secondary | ICD-10-CM

## 2016-06-09 DIAGNOSIS — O09899 Supervision of other high risk pregnancies, unspecified trimester: Secondary | ICD-10-CM

## 2016-06-09 NOTE — Progress Notes (Signed)
MFM consult:  I reviewed with the patient the pathophysiology of preterm delivery in pregnancy and that often the etiology of the preterm delivery is not ascertained from the clinical scenario. In this patient's case she had a presentation at 19wks leakage of fluid, contractions, and subseqent  twins and apparent cervical insufficiency vs. Spontaneous PTL and PTB. The presentation could have been CI (cervical insufficiency), PTL (preterm labor), or a mix of the two.  While I cannot rule out CI, it appears to be more of a preterm labor type presentation with subsequent risk in this pregnancy amenable to progesterone therapy.   Infection may be a cause of preterm labor and preterm delivery. Advanced cervical dilation can also lead to infection; therefore, there are 2 mechanisms that could be contributing to the preterm delivery. Nonetheless the recurrence for spontaneous preterm birth is 2 times higher than the general population in women who experience a prior preterm delivery and the actual risk can be in the range of 50% to 60%. The risk of preterm delivery increases with each prior preterm birth. I did remind the patient that there is no treatment for preterm delivery, but there are ways to follow the next pregnancy in order to make appropriate interventions.  The patient is now well versed on need for progesterone and desires the injections to reduce her risk. It is important to note findings of the Prevention of Recurrent Preterm Delivery by 17 Alpha-Hydroxyprogesterone Caproate Study in the [place] Journal of Medicine, June 2003. The results of this study showed the use of the progesterone reduced the risk of preterm delivery less than 37 weeks to 36% compared to 55% in the control group. Delivery less than 32 weeks was decreased to 12% in the progesterone group compared to 20% in the control group. This was found to be a significant difference between the groups.  ? Therefore, with her history of  2nd trimester loss/preterm delivery at 19-20 weeks (essentially presented very much like preterm labor), I recommend continuing the 17-alpha progesterone injections as you have already started and continuing weekly injections until [redacted] weeks gestation. The mechanism of action of the progesterone is not entirely known, there are some theories that the progesterone relaxes the myometrium and leads to thick cervical mucus which prevents ascending infection from vaginal microorganisms as well as providing some protection from inflammation.  ? Given the reassuring cervical length of 4cm, I scheduled her for follow up in 2 weeks.  If <2.5cm prior to 24 week, I would offer an ultrasound-indicated cerclage.  If <2.5cm at any point prior to 28 weeks I would start vaginal progesterone (prometrium  pv qhs until 36 weeks in addition to Makena injections).  ? Summary of Recommendations:  1. 17P/Makena weekly until 36 week  2. Repeat cervical length 2 weeks (x3)  3. if cervical length is <2.5cm prior to 24 weeks, she qualifies for ultrasound-indicated cerclage   4. If cervical length is <2.5cm prior to 28 weeks, I recommend adding vaginal progesterone (prometrium  pv qhs until 36 weeks).  ? Time Spent:  I spent in excess of 40 minutes in consultation with this patient to review records, evaluate her case, and provide her with an adequate discussion and education. More than 50% of this time was spent in direct face-to-face counseling.  ? It was a pleasure seeing your patient in the office today. Thank you for consultation. Please do not hesitate to contact our service for any further questions.  ?  Thank you,  ?  Louann Sjogren Daelynn Blower  ?  Rogelia Boga, MD, MS, FACOG  Assistant Professor  Section of Maternal-Fetal Medicine  [facility]

## 2016-06-10 ENCOUNTER — Other Ambulatory Visit (HOSPITAL_COMMUNITY): Payer: Self-pay | Admitting: *Deleted

## 2016-06-10 DIAGNOSIS — O3430 Maternal care for cervical incompetence, unspecified trimester: Secondary | ICD-10-CM

## 2016-06-11 ENCOUNTER — Ambulatory Visit: Payer: Self-pay

## 2016-06-17 ENCOUNTER — Ambulatory Visit (INDEPENDENT_AMBULATORY_CARE_PROVIDER_SITE_OTHER): Payer: Medicaid Other | Admitting: Obstetrics and Gynecology

## 2016-06-17 VITALS — BP 117/51 | HR 64 | Wt 167.0 lb

## 2016-06-17 DIAGNOSIS — O09899 Supervision of other high risk pregnancies, unspecified trimester: Secondary | ICD-10-CM

## 2016-06-17 DIAGNOSIS — J4520 Mild intermittent asthma, uncomplicated: Secondary | ICD-10-CM

## 2016-06-17 DIAGNOSIS — O99512 Diseases of the respiratory system complicating pregnancy, second trimester: Secondary | ICD-10-CM

## 2016-06-17 DIAGNOSIS — O09212 Supervision of pregnancy with history of pre-term labor, second trimester: Secondary | ICD-10-CM

## 2016-06-17 DIAGNOSIS — O09219 Supervision of pregnancy with history of pre-term labor, unspecified trimester: Secondary | ICD-10-CM

## 2016-06-17 DIAGNOSIS — O0992 Supervision of high risk pregnancy, unspecified, second trimester: Secondary | ICD-10-CM

## 2016-06-17 MED ORDER — ASPIRIN EC 81 MG PO TBEC: 81.0000 mg | DELAYED_RELEASE_TABLET | Freq: Every day | ORAL | 10 refills | Status: DC

## 2016-06-17 NOTE — Progress Notes (Signed)
Prenatal Visit Note Date: 06/17/2016 Clinic: [facility]  Subjective:  Ana [name] is a 26 y.o. [id number] at [redacted]w[redacted]d being seen today for ongoing prenatal care.  She is currently monitored for the following issues for this high-risk pregnancy and has Asthma, mild intermittent; Supervision of high risk pregnancy, antepartum, second trimester; and History of preterm delivery, currently pregnant on her problem list.  Patient reports no complaints.   Contractions: Not present. Vag. Bleeding: None.  Movement: Present. Denies leaking of fluid.   The following portions of the patient's history were reviewed and updated as appropriate: allergies, current medications, past family history, past medical history, past social history, past surgical history and problem list. Problem list updated.  Objective:   Vitals:   06/17/16 1250  BP: (!) 117/51  Pulse: 64  Weight: 167 lb (75.8 kg)    Fetal Status: Fetal Heart Rate (bpm): 136   Movement: Present     General:  Alert, oriented and cooperative. Patient is in no acute distress.  Skin: Skin is warm and dry. No rash noted.   Cardiovascular: Normal heart rate noted  Respiratory: Normal respiratory effort, no problems with respiration noted  Abdomen: Soft, gravid, appropriate for gestational age. Pain/Pressure: Present     Pelvic:  Cervical exam deferred        Extremities: Normal range of motion.  Edema: None  Mental Status: Normal mood and affect. Normal behavior. Normal judgment and thought content.   Urinalysis:      Assessment and Plan:  Pregnancy: [id number] at [redacted]w[redacted]d  1. Supervision of high risk pregnancy, antepartum, second trimester Routine care. Anatomy scan normal last week. Quad screen negative last visit.   2. History of preterm delivery, currently pregnant Continue qwk 17p. Getting q2wk CLs.  4/3 CL >4cm  3. Mild intermittent asthma without complication No meds; no issues.   Preterm labor symptoms and general  obstetric precautions including but not limited to vaginal bleeding, contractions, leaking of fluid and fetal movement were reviewed in detail with the patient. Please refer to After Visit Summary for other counseling recommendations.  Return in about 3 weeks (around 07/08/2016).   Telford Bing, MD

## 2016-06-17 NOTE — Progress Notes (Signed)
Notified [employer] in regards to refill on 17p.  Was informed will be shipped by end of week.

## 2016-06-18 ENCOUNTER — Encounter: Payer: Self-pay | Admitting: Advanced Practice Midwife

## 2016-06-23 ENCOUNTER — Ambulatory Visit: Payer: Self-pay

## 2016-06-23 ENCOUNTER — Encounter (HOSPITAL_COMMUNITY): Payer: Self-pay

## 2016-06-23 ENCOUNTER — Ambulatory Visit (HOSPITAL_COMMUNITY)
Admission: RE | Admit: 2016-06-23 | Discharge: 2016-06-23 | Disposition: A | Discharge status: Home / Self Care | Payer: Medicaid Other | Source: Ambulatory Visit | Attending: Obstetrics & Gynecology | Admitting: Obstetrics & Gynecology

## 2016-06-30 ENCOUNTER — Ambulatory Visit (INDEPENDENT_AMBULATORY_CARE_PROVIDER_SITE_OTHER): Payer: Medicaid Other | Admitting: *Deleted

## 2016-06-30 VITALS — BP 128/48 | HR 69 | Wt 171.0 lb

## 2016-06-30 DIAGNOSIS — O09212 Supervision of pregnancy with history of pre-term labor, second trimester: Secondary | ICD-10-CM

## 2016-06-30 DIAGNOSIS — O09219 Supervision of pregnancy with history of pre-term labor, unspecified trimester: Principal | ICD-10-CM

## 2016-06-30 DIAGNOSIS — O09899 Supervision of other high risk pregnancies, unspecified trimester: Secondary | ICD-10-CM

## 2016-07-01 ENCOUNTER — Encounter (HOSPITAL_COMMUNITY): Payer: Self-pay | Admitting: *Deleted

## 2016-07-01 ENCOUNTER — Inpatient Hospital Stay (HOSPITAL_COMMUNITY)
Admission: AD | Admit: 2016-07-01 | Discharge: 2016-07-01 | Disposition: A | Discharge status: Home / Self Care | Payer: Medicaid Other | Source: Ambulatory Visit | Attending: Obstetrics and Gynecology | Admitting: Obstetrics and Gynecology

## 2016-07-01 DIAGNOSIS — R109 Unspecified abdominal pain: Secondary | ICD-10-CM | POA: Insufficient documentation

## 2016-07-01 DIAGNOSIS — O99512 Diseases of the respiratory system complicating pregnancy, second trimester: Secondary | ICD-10-CM | POA: Diagnosis not present

## 2016-07-01 DIAGNOSIS — Z87891 Personal history of nicotine dependence: Secondary | ICD-10-CM | POA: Insufficient documentation

## 2016-07-01 DIAGNOSIS — R51 Headache: Secondary | ICD-10-CM | POA: Diagnosis present

## 2016-07-01 DIAGNOSIS — J45909 Unspecified asthma, uncomplicated: Secondary | ICD-10-CM | POA: Diagnosis not present

## 2016-07-01 DIAGNOSIS — G43809 Other migraine, not intractable, without status migrainosus: Secondary | ICD-10-CM

## 2016-07-01 DIAGNOSIS — O26892 Other specified pregnancy related conditions, second trimester: Secondary | ICD-10-CM | POA: Diagnosis present

## 2016-07-01 DIAGNOSIS — Z3A22 22 weeks gestation of pregnancy: Secondary | ICD-10-CM | POA: Diagnosis not present

## 2016-07-01 DIAGNOSIS — G43909 Migraine, unspecified, not intractable, without status migrainosus: Secondary | ICD-10-CM | POA: Insufficient documentation

## 2016-07-01 DIAGNOSIS — Z3492 Encounter for supervision of normal pregnancy, unspecified, second trimester: Secondary | ICD-10-CM

## 2016-07-01 LAB — URINALYSIS, ROUTINE W REFLEX MICROSCOPIC
Bilirubin Urine: NEGATIVE
Glucose, UA: NEGATIVE mg/dL
Hgb urine dipstick: NEGATIVE
Ketones, ur: NEGATIVE mg/dL
LEUKOCYTES UA: NEGATIVE
Nitrite: NEGATIVE
PROTEIN: NEGATIVE mg/dL
Specific Gravity, Urine: 1.018 (ref 1.005–1.030)
pH: 7 (ref 5.0–8.0)

## 2016-07-01 MED ORDER — SODIUM CHLORIDE 0.9 % IV SOLN
INTRAVENOUS | Status: DC
Start: 2016-07-01 — End: 2016-07-01
  Administered 2016-07-01: 11:00:00 via INTRAVENOUS

## 2016-07-01 MED ORDER — IBUPROFEN 600 MG PO TABS
600.0000 mg | ORAL_TABLET | Freq: Once | ORAL | Status: AC
  Administered 2016-07-01: 600 mg via ORAL
  Filled 2016-07-01: qty 1

## 2016-07-01 MED ORDER — DEXAMETHASONE SODIUM PHOSPHATE 10 MG/ML IJ SOLN
10.0000 mg | Freq: Once | INTRAMUSCULAR | Status: AC
  Administered 2016-07-01: 10 mg via INTRAVENOUS
  Filled 2016-07-01: qty 1

## 2016-07-01 MED ORDER — DIPHENHYDRAMINE HCL 50 MG/ML IJ SOLN
25.0000 mg | Freq: Once | INTRAMUSCULAR | Status: AC
  Administered 2016-07-01: 25 mg via INTRAVENOUS
  Filled 2016-07-01: qty 1

## 2016-07-01 MED ORDER — METOCLOPRAMIDE HCL 5 MG/ML IJ SOLN
10.0000 mg | Freq: Once | INTRAMUSCULAR | Status: AC
  Administered 2016-07-01: 10 mg via INTRAVENOUS
  Filled 2016-07-01: qty 2

## 2016-07-01 NOTE — MAU Note (Signed)
Yesterday, her left leg was swollen, it has gone down, but whole left side is hurting.  Even has a headache, on the left side.

## 2016-07-01 NOTE — Discharge Instructions (Signed)
. °Preterm Labor and Birth Information °The normal length of a pregnancy is 39-41 weeks. Preterm labor is when labor starts before 37 completed weeks of pregnancy. °What are the risk factors for preterm labor? °Preterm labor is more likely to occur in women who: °· Have certain infections during pregnancy such as a bladder infection, sexually transmitted infection, or infection inside the uterus (chorioamnionitis). °· Have a shorter-than-normal cervix. °· Have gone into preterm labor before. °· Have had surgery on their cervix. °· Are younger than age 17 or older than age 35. °· Are African American. °· Are pregnant with twins or multiple babies (multiple gestation). °· Take street drugs or smoke while pregnant. °· Do not gain enough weight while pregnant. °· Became pregnant shortly after having been pregnant. °What are the symptoms of preterm labor? °Symptoms of preterm labor include: °· Cramps similar to those that can happen during a menstrual period. The cramps may happen with diarrhea. °· Pain in the abdomen or lower back. °· Regular uterine contractions that may feel like tightening of the abdomen. °· A feeling of increased pressure in the pelvis. °· Increased watery or bloody mucus discharge from the vagina. °· Water breaking (ruptured amniotic sac). °Why is it important to recognize signs of preterm labor? °It is important to recognize signs of preterm labor because babies who are born prematurely may not be fully developed. This can put them at an increased risk for: °· Long-term (chronic) heart and lung problems. °· Difficulty immediately after birth with regulating body systems, including blood sugar, body temperature, heart rate, and breathing rate. °· Bleeding in the brain. °· Cerebral palsy. °· Learning difficulties. °· Death. °These risks are highest for babies who are born before 34 weeks of pregnancy. °How is preterm labor treated? °Treatment depends on the length of your pregnancy, your condition,  and the health of your baby. It may involve: °· Having a stitch (suture) placed in your cervix to prevent your cervix from opening too early (cerclage). °· Taking or being given medicines, such as: °¨ Hormone medicines. These may be given early in pregnancy to help support the pregnancy. °¨ Medicine to stop contractions. °¨ Medicines to help mature the baby’s lungs. These may be prescribed if the risk of delivery is high. °¨ Medicines to prevent your baby from developing cerebral palsy. °If the labor happens before 34 weeks of pregnancy, you may need to stay in the hospital. °What should I do if I think I am in preterm labor? °If you think that you are going into preterm labor, call your health care provider right away. °How can I prevent preterm labor in future pregnancies? °To increase your chance of having a full-term pregnancy: °· Do not use any tobacco products, such as cigarettes, chewing tobacco, and e-cigarettes. If you need help quitting, ask your health care provider. °· Do not use street drugs or medicines that have not been prescribed to you during your pregnancy. °· Talk with your health care provider before taking any herbal supplements, even if you have been taking them regularly. °· Make sure you gain a healthy amount of weight during your pregnancy. °· Watch for infection. If you think that you might have an infection, get it checked right away. °· Make sure to tell your health care provider if you have gone into preterm labor before. °This information is not intended to replace advice given to you by your health care provider. Make sure you discuss any questions you have with your   care provider. Document Released: 05/16/2003 Document Revised: 08/06/2015 Document Reviewed: 07/17/2015 Elsevier Interactive Patient Education  2017 Else[employer] Inc. General Headache Without Cause A headache is pain or discomfort felt around the head or neck area. The specific cause of a headache may not be found.  There are many causes and types of headaches. A few common ones are:  Tension headaches.  Migraine headaches.  Cluster headaches.  Chronic daily headaches. Follow these instructions at home: Watch your condition for any changes. Take these steps to help with your condition: Managing pain   Take over-the-counter and prescription medicines only as told by your health care provider.  Lie down in a dark, quiet room when you have a headache.  If directed, apply ice to the head and neck area:  Put ice in a plastic bag.  Place a towel between your skin and the bag.  Leave the ice on for 20 minutes, 2-3 times per day.  Use a heating pad or hot shower to apply heat to the head and neck area as told by your health care provider.  Keep lights dim if bright lights bother you or make your headaches worse. Eating and drinking   Eat meals on a regular schedule.  Limit alcohol use.  Decrease the amount of caffeine you drink, or stop drinking caffeine. General instructions   Keep all follow-up visits as told by your health care provider. This is important.  Keep a headache journal to help find out what may trigger your headaches. For example, write down:  What you eat and drink.  How much sleep you get.  Any change to your diet or medicines.  Try massage or other relaxation techniques.  Limit stress.  Sit up straight, and do not tense your muscles.  Do not use tobacco products, including cigarettes, chewing tobacco, or e-cigarettes. If you need help quitting, ask your health care provider.  Exercise regularly as told by your health care provider.  Sleep on a regular schedule. Get 7-9 hours of sleep, or the amount recommended by your health care provider. Contact a health care provider if:  Your symptoms are not helped by medicine.  You have a headache that is different from the usual headache.  You have nausea or you vomit.  You have a fever. Get help right away  if:  Your headache becomes severe.  You have repeated vomiting.  You have a stiff neck.  You have a loss of vision.  You have problems with speech.  You have pain in the eye or ear.  You have muscular weakness or loss of muscle control.  You lose your balance or have trouble walking.  You feel faint or pass out.  You have confusion. This information is not intended to replace advice given to you by your health care provider. Make sure you discuss any questions you have with your health care provider. Document Released: 02/23/2005 Document Revised: 08/01/2015 Document Reviewed: 06/18/2014 Elsevier Interactive Patient Education  2017 [employer].

## 2016-07-01 NOTE — MAU Provider Note (Signed)
History     CSN: [id number]  Arrival date and time: 07/01/16 1009  First Provider Initiated Contact with Patient 07/01/16 1050      Chief Complaint  Patient presents with  . Abdominal Pain  . Headache   HPI Ana [name] is a 26 y.o. [id number] at [redacted]w[redacted]d who presents with headache & left sided pain. Symptoms began last night. Reports headache in left side of head. Rates pain 9/10. Feels like headaches she's had before. Has not treated. Lights, sounds, & movement make pain worse. Also reports pain on her left flank & left leg that started with headache. Pain is constant & made it difficult for her to sleep. Does not recall any fall or injury. Reports swelling of her left lower leg yesterday that resolved yesterday evening. Also has lower abdominal cramping that is intermittent & separate from her side pain.  Denies visual disturbance, chest pain, SOB, cough, vaginal bleeding, LOF, dysuria, hematuria, or fever/chills. Positive fetal movement.   OB History    Gravida Para Term Preterm AB Living   0 2 5   SAB TAB Ectopic Multiple Live Births   2 0 0 0 5      Past Medical History:  Diagnosis Date  . Abnormal Pap smear   . Asthma   . Chlamydia Jan 2014  . Kidney infection   . Vaginal Pap smear, abnormal     Past Surgical History:  Procedure Laterality Date  . WISDOM TOOTH EXTRACTION      Family History  Problem Relation Age of Onset  . Hypertension Mother   . Hypertension Maternal Grandmother     Social History  Substance Use Topics  . Smoking status: Former Smoker    Quit date: 04/25/2014  . Smokeless tobacco: Never Used  . Alcohol use No     Comment: occasional/not since pregnancy    Allergies: No Known Allergies  Facility-Administered Medications Prior to Admission  Medication Dose Route Frequency Provider Last Rate Last Dose  . hydroxyprogesterone caproate (MAKENA) 250 mg/mL injection 250 mg  250 mg Intramuscular Weekly Adam Phenix, MD   250 mg at 06/30/16  1033   Prescriptions Prior to Admission  Medication Sig Dispense Refill Last Dose  . albuterol (PROVENTIL HFA;VENTOLIN HFA) 108 (90 Base) MCG/ACT inhaler Inhale 2 puffs into the lungs every 6 (six) hours as needed for wheezing or shortness of breath. 1 Inhaler 2 Taking  . ALPRAZolam (XANAX) 1 MG tablet Take 1 mg by mouth 3 (three) times daily as needed for anxiety.   Not Taking  . cetirizine (ZYRTEC ALLERGY) 10 MG tablet Take 1 tablet (10 mg total) by mouth daily. 30 tablet 6 Taking  . ondansetron (ZOFRAN) 8 MG tablet Take 1 tablet (8 mg total) by mouth every 8 (eight) hours as needed for nausea or vomiting. 10 tablet 0 Taking  . Prenatal Multivit-Min-Fe-FA (PRENATAL VITAMINS) 0.8 MG tablet Take 1 tablet by mouth daily. 30 tablet 2 Taking  . promethazine (PHENERGAN) 25 MG tablet Take 1 tablet (25 mg total) by mouth every 6 (six) hours as needed for nausea or vomiting. 30 tablet 0 Taking    Review of Systems  Constitutional: Negative.   HENT: Negative for sore throat.   Eyes: Positive for photophobia. Negative for visual disturbance.  Respiratory: Negative for cough and shortness of breath.   Cardiovascular: Positive for leg swelling (yesterday; not currently). Negative for chest pain.  Gastrointestinal: Positive for abdominal pain. Negative for constipation, diarrhea, nausea and vomiting.  Genitourinary: Positive for flank pain. Negative for dysuria, frequency, hematuria, vaginal bleeding and vaginal discharge.  Musculoskeletal: Negative for neck stiffness.  Neurological: Positive for headaches. Negative for dizziness, seizures, syncope, facial asymmetry, weakness and light-headedness.   Physical Exam   Blood pressure (!) 110/51, pulse 69, temperature 98.7 F (37.1 C), temperature source Oral, resp. rate 18, weight 173 lb (78.5 kg), last menstrual period 12/29/2015, SpO2 100 %, unknown if currently breastfeeding.  Physical Exam  Nursing note and vitals reviewed. Constitutional: She is  oriented to person, place, and time. She appears well-developed and well-nourished. No distress.  HENT:  Head: Normocephalic and atraumatic.  Eyes: Conjunctivae and EOM are normal. Pupils are equal, round, and reactive to light. Right eye exhibits no discharge. Left eye exhibits no discharge. No scleral icterus.  Neck: Normal range of motion.  Cardiovascular: Normal rate, regular rhythm and normal heart sounds.   No murmur heard. Respiratory: Effort normal and breath sounds normal. No respiratory distress. She has no wheezes.  GI: Soft. Bowel sounds are normal. There is tenderness (mild tenderness in LLQ & left side) in the left lower quadrant. There is no rigidity, no guarding and no CVA tenderness.  Genitourinary:  Genitourinary Comments: Cervix closed/thick/posterior  Musculoskeletal: She exhibits no edema or tenderness.       Right lower leg: Normal.       Left lower leg: Normal. She exhibits no swelling.  Neurological: She is alert and oriented to person, place, and time. She has normal strength and normal reflexes. No cranial nerve deficit.  Reflex Scores:      Patellar reflexes are 2+ on the right side and 2+ on the left side. Skin: Skin is warm and dry. She is not diaphoretic.  Psychiatric: She has a normal mood and affect. Her behavior is normal. Judgment and thought content normal.    MAU Course  Procedures Results for orders placed or performed during the hospital encounter of 07/01/16 (from the past 24 hour(s))  Urinalysis, Routine w reflex microscopic     Status: None   Collection Time: 07/01/16 10:20 AM  Result Value Ref Range   Color, Urine YELLOW YELLOW   APPearance CLEAR CLEAR   Specific Gravity, Urine 1.018 1.005 - 1.030   pH 7.0 5.0 - 8.0   Glucose, UA NEGATIVE NEGATIVE mg/dL   Hgb urine dipstick NEGATIVE NEGATIVE   Bilirubin Urine NEGATIVE NEGATIVE   Ketones, ur NEGATIVE NEGATIVE mg/dL   Protein, ur NEGATIVE NEGATIVE mg/dL   Nitrite NEGATIVE NEGATIVE    Leukocytes, UA NEGATIVE NEGATIVE    MDM FHT 152 VSS, NAD Abdomen soft & non tender; cervix closed/thick Negative u/a, no CVAT Normal neuro exam No evidence of DVT IV headache cocktail (reglan, decadron, benadryl) -- headache improved 9>1 Ibuprofen 600 mg PO Pt reports improvement in symptoms & requesting discharge Assessment and Plan  A: 1. Other migraine without status migrainosus, not intractable   2. Fetal heart tones present, second trimester    P; Discharge home Discussed tx of future headaches at home Discussed reasons to return to MAU including PTL precautions Keep f/u with OB  Judeth Horn 07/01/2016, 10:44 AM

## 2016-07-05 ENCOUNTER — Other Ambulatory Visit: Payer: Self-pay | Admitting: Student

## 2016-07-05 DIAGNOSIS — J4520 Mild intermittent asthma, uncomplicated: Secondary | ICD-10-CM

## 2016-07-07 ENCOUNTER — Ambulatory Visit (HOSPITAL_COMMUNITY)
Admission: RE | Admit: 2016-07-07 | Discharge: 2016-07-07 | Disposition: A | Discharge status: Home / Self Care | Payer: Medicaid Other | Source: Ambulatory Visit | Attending: Obstetrics & Gynecology | Admitting: Obstetrics & Gynecology

## 2016-07-07 ENCOUNTER — Encounter (HOSPITAL_COMMUNITY): Payer: Self-pay

## 2016-07-07 ENCOUNTER — Encounter: Payer: Self-pay | Admitting: Obstetrics and Gynecology

## 2016-07-07 DIAGNOSIS — Z3A23 23 weeks gestation of pregnancy: Secondary | ICD-10-CM | POA: Diagnosis not present

## 2016-07-07 DIAGNOSIS — O09212 Supervision of pregnancy with history of pre-term labor, second trimester: Secondary | ICD-10-CM | POA: Insufficient documentation

## 2016-07-07 DIAGNOSIS — O3430 Maternal care for cervical incompetence, unspecified trimester: Secondary | ICD-10-CM

## 2016-07-08 ENCOUNTER — Encounter: Payer: Self-pay | Admitting: Obstetrics and Gynecology

## 2016-07-08 ENCOUNTER — Encounter: Payer: Self-pay | Admitting: General Practice

## 2016-07-08 NOTE — Progress Notes (Signed)
Patient did not keep OB appointment for 07/08/2016.  Cornelia Copa MD Attending [facility])

## 2016-07-15 ENCOUNTER — Ambulatory Visit (INDEPENDENT_AMBULATORY_CARE_PROVIDER_SITE_OTHER): Payer: Medicaid Other | Admitting: Obstetrics and Gynecology

## 2016-07-15 VITALS — BP 111/51 | HR 88 | Wt 175.0 lb

## 2016-07-15 DIAGNOSIS — J4520 Mild intermittent asthma, uncomplicated: Secondary | ICD-10-CM | POA: Diagnosis not present

## 2016-07-15 DIAGNOSIS — O0992 Supervision of high risk pregnancy, unspecified, second trimester: Secondary | ICD-10-CM | POA: Diagnosis present

## 2016-07-15 DIAGNOSIS — O09212 Supervision of pregnancy with history of pre-term labor, second trimester: Secondary | ICD-10-CM | POA: Diagnosis not present

## 2016-07-15 DIAGNOSIS — O09219 Supervision of pregnancy with history of pre-term labor, unspecified trimester: Secondary | ICD-10-CM

## 2016-07-15 DIAGNOSIS — O09899 Supervision of other high risk pregnancies, unspecified trimester: Secondary | ICD-10-CM

## 2016-07-15 LAB — POCT URINALYSIS DIP (DEVICE)
GLUCOSE, UA: NEGATIVE mg/dL
Hgb urine dipstick: NEGATIVE
Nitrite: NEGATIVE
Protein, ur: 30 mg/dL — AB
SPECIFIC GRAVITY, URINE: 1.025 (ref 1.005–1.030)
UROBILINOGEN UA: 2 mg/dL — AB (ref 0.0–1.0)
pH: 7 (ref 5.0–8.0)

## 2016-07-15 NOTE — Progress Notes (Signed)
Prenatal Visit Note Date: 07/15/2016 [facility]  Subjective:  Sydnee Cabalyshai Boomer is a 26 y.o. [id number] at 634w5d being seen today for ongoing prenatal care.  She is currently monitored for the following issues for this high-risk pregnancy and has Asthma, mild intermittent; Supervision of high risk pregnancy, antepartum, second trimester; and History of preterm delivery, currently pregnant on her problem list.  Patient reports no complaints.   Contractions: Not present.  .  Movement: Present. Denies leaking of fluid.   The following portions of the patient's history were reviewed and updated as appropriate: allergies, current medications, past family history, past medical history, past social history, past surgical history and problem list. Problem list updated.  Objective:   Vitals:   07/15/16 1531  BP: (!) 111/51  Pulse: 88  Weight: 175 lb (79.4 kg)    Fetal Status: Fetal Heart Rate (bpm): 148 Fundal Height: 24 cm Movement: Present     General:  Alert, oriented and cooperative. Patient is in no acute distress.  Skin: Skin is warm and dry. No rash noted.   Cardiovascular: Normal heart rate noted  Respiratory: Normal respiratory effort, no problems with respiration noted  Abdomen: Soft, gravid, appropriate for gestational age. Pain/Pressure: Present     Pelvic:  Cervical exam deferred        Extremities: Normal range of motion.  Edema: None  Mental Status: Normal mood and affect. Normal behavior. Normal judgment and thought content.   Urinalysis:      Assessment and Plan:  Pregnancy: [id number] at 734w5d  1. Supervision of high risk pregnancy, antepartum, second trimester Routine care. 28wk labs at next rob  2. History of preterm delivery, currently pregnant Normal CL on 5/1. Continue 17p qwk  3. Mild intermittent asthma without complication Doing well. No meds  Preterm labor symptoms and general obstetric precautions including but not limited to vaginal  bleeding, contractions, leaking of fluid and fetal movement were reviewed in detail with the patient. Please refer to After Visit Summary for other counseling recommendations.  Return in about 1 week (around 07/22/2016).    BingPickens, Demond Shallenberger, MD

## 2016-07-15 NOTE — Progress Notes (Signed)
17 INJECTION TODAY

## 2016-07-21 ENCOUNTER — Ambulatory Visit (HOSPITAL_COMMUNITY): Payer: Medicaid Other

## 2016-07-22 ENCOUNTER — Ambulatory Visit: Payer: Self-pay

## 2016-07-23 ENCOUNTER — Other Ambulatory Visit: Payer: Self-pay | Admitting: Student

## 2016-07-23 DIAGNOSIS — J4520 Mild intermittent asthma, uncomplicated: Secondary | ICD-10-CM

## 2016-07-29 ENCOUNTER — Ambulatory Visit: Payer: Self-pay

## 2016-08-04 ENCOUNTER — Ambulatory Visit (INDEPENDENT_AMBULATORY_CARE_PROVIDER_SITE_OTHER): Payer: Medicaid Other | Admitting: Family Medicine

## 2016-08-04 VITALS — BP 108/68 | HR 77 | Wt 181.5 lb

## 2016-08-04 DIAGNOSIS — J4520 Mild intermittent asthma, uncomplicated: Secondary | ICD-10-CM

## 2016-08-04 DIAGNOSIS — O09899 Supervision of other high risk pregnancies, unspecified trimester: Secondary | ICD-10-CM

## 2016-08-04 DIAGNOSIS — O0992 Supervision of high risk pregnancy, unspecified, second trimester: Secondary | ICD-10-CM

## 2016-08-04 DIAGNOSIS — O09219 Supervision of pregnancy with history of pre-term labor, unspecified trimester: Secondary | ICD-10-CM

## 2016-08-04 DIAGNOSIS — O09212 Supervision of pregnancy with history of pre-term labor, second trimester: Secondary | ICD-10-CM

## 2016-08-04 NOTE — Patient Instructions (Signed)
Glucose Tolerance Test The glucose tolerance test (GTT) is one of several tests used to diagnose diabetes mellitus. The GTT is a blood test, and it may include a urine test as well. The GTT checks to see how your body processes sugar (glucose). For this test, you will consume a drink containing a high level of glucose. Your blood glucose levels will be checked before you consume the drink and then again 1, 2, 3, and possibly 4 hours after you consume it. Your health care provider may recommend that you have the GTT if you:  Have a family history of diabetes.  Are very overweight (obese).  Have experienced infections that keep coming back.  Have had numerous cuts or wounds that did not heal quickly, especially on your legs and feet.  Are a woman and have a history of giving birth to very large babies or a history of repeated fetal loss (stillbirth).  Have had glucose in your urine or high blood sugar: ? During pregnancy. ? After a heart attack, surgery, or prolonged periods of high stress.  The GTT lasts 3-4 hours. Other than the glucose solution, you will not be allowed to eat or drink anything during the test. You must remain at the testing location to make sure that your blood and urine samples are taken on time. How do I prepare for this test? Eat normally for 3 days prior to the GTT test, including having plenty of carbohydrate-rich foods. Do not eat or drink anything except water during the final 12 hours before the test. You should not smoke or exercise during the test. In addition, your health care provider may ask you to stop taking certain medicines before the test. What do the results mean? It is your responsibility to obtain your test results. Ask the lab or department performing the test when and how you will get your results. Contact your health care provider to discuss any questions you have about your results. Range of Normal Values Ranges for normal values may vary among  different labs and hospitals. You should always check with your health care provider after having lab work or other tests done to discuss whether your values are considered within normal limits. Normal levels of blood glucose are as follows:  Fasting: less than 110 mg/dL or less than 6.1 mmol/L (SI units).  1 hour after consuming the glucose drink: less than 200 mg/dL or less than 11.1 mmol/L.  2 hours after consuming the glucose drink: less than 140 mg/dL or less than 7.8 mmol/L.  3 hours after consuming the glucose drink: 70-115 mg/dL or less than 6.4 mmol/L.  4 hours after consuming the glucose drink: 70-115 mg/dL or less than 6.4 mmol/L.  The normal result for the urine test is negative, meaning that glucose is absent from your urine. Some substances can interfere with GTT results. These may include:  Blood pressure and heart failure medicines, including beta blockers, furosemide, and thiazides.  Anti-inflammatory medicines, including aspirin.  Nicotine.  Some psychiatric medicines.  Oral contraceptives.  Diuretics or corticosteroids.  Meaning of Results Outside Normal Value Ranges GTT test results that are above normal values may indicate health problems, such as:  Diabetes mellitus.  Acute stress response.  Cushing syndrome.  Tumors such as pheochromocytoma or glucagonoma.  Chronic renal failure.  Pancreatitis.  Hyperthyroidism.  Current infection.  Discuss your test results with your health care provider. He or she will use the results to make a diagnosis and determine a treatment plan   that is right for you. Talk with your health care provider to discuss your results, treatment options, and if necessary, the need for more tests. Talk with your health care provider if you have any questions about your results. This information is not intended to replace advice given to you by your health care provider. Make sure you discuss any questions you have with your  health care provider. Document Released: 03/18/2004 Document Revised: 10/29/2015 Document Reviewed: 06/30/2013 Elsevier Interactive Patient Education  2017 [employer]  

## 2016-08-04 NOTE — Progress Notes (Signed)
      Subjective:  Ana [name] is a 26 y.o. [id number] at 7847w4d being seen today for ongoing prenatal care.  She is currently monitored for the following issues for this high-risk pregnancy and has Asthma, mild intermittent; Supervision of high risk pregnancy, antepartum, second trimester; and History of preterm delivery, currently pregnant on her problem list.  Patient reports no complaints.  Contractions: Not present.  .  Movement: Present. Denies leaking of fluid.   The following portions of the patient's history were reviewed and updated as appropriate: allergies, current medications, past family history, past medical history, past social history, past surgical history and problem list. Problem list updated.  Objective:   Vitals:   08/04/16 0847  BP: 108/68  Pulse: 77  Weight: 181 lb 8 oz (82.3 kg)    Fetal Status: Fetal Heart Rate (bpm): 135 Fundal Height: 27 cm Movement: Present      General:  Alert, oriented and cooperative. Patient is in no acute distress.  Skin: Skin is warm and dry. No rash noted.   Cardiovascular: Normal heart rate noted  Respiratory: Normal respiratory effort, no problems with respiration noted  Abdomen: Soft, gravid, appropriate for gestational age. Pain/Pressure: Present     Pelvic:  Cervical exam deferred        Extremities: Normal range of motion.  Edema: None  Mental Status: Normal mood and affect. Normal behavior. Normal judgment and thought content.   Urinalysis:      Assessment and Plan:  Pregnancy: [id number] at 2647w4d  1. Supervision of high risk pregnancy in second trimester - Glucose Tolerance, 2 Hours w/1 Hour - RPR - CBC - HIV antibody  2. History of preterm delivery, currently pregnant - Continue 17-P weekly - Previous 19.6 week PTL/fetal demise?, on Makena; Had 17-P with last full term pregnancy; then had 7 week miscarriage.  3. Mild intermittent asthma without complication -controlled   MOC: Depo MOF: Breastfeeding Preterm  labor symptoms and general obstetric precautions including but not limited to vaginal bleeding, contractions, leaking of fluid and fetal movement were reviewed in detail with the patient. Please refer to After Visit Summary for other counseling recommendations.  Return in about 4 weeks (around 09/01/2016) for Routine OB visit.   Jen MowElizabeth Sharlee Rufino, DO OB Fellow [facility]

## 2016-08-05 LAB — CBC
Hematocrit: 33.3 % — ABNORMAL LOW (ref 34.0–46.6)
Hemoglobin: 10.4 g/dL — ABNORMAL LOW (ref 11.1–15.9)
MCH: 28.4 pg (ref 26.6–33.0)
MCHC: 31.2 g/dL — AB (ref 31.5–35.7)
MCV: 91 fL (ref 79–97)
PLATELETS: 217 10*3/uL (ref 150–379)
RBC: 3.66 x10E6/uL — ABNORMAL LOW (ref 3.77–5.28)
RDW: 13.9 % (ref 12.3–15.4)
WBC: 8.1 10*3/uL (ref 3.4–10.8)

## 2016-08-05 LAB — GLUCOSE TOLERANCE, 2 HOURS W/ 1HR
GLUCOSE, 1 HOUR: 75 mg/dL (ref 65–179)
GLUCOSE, FASTING: 72 mg/dL (ref 65–91)
Glucose, 2 hour: 74 mg/dL (ref 65–152)

## 2016-08-05 LAB — RPR: RPR Ser Ql: NONREACTIVE

## 2016-08-05 LAB — HIV ANTIBODY (ROUTINE TESTING W REFLEX): HIV Screen 4th Generation wRfx: NONREACTIVE

## 2016-08-11 ENCOUNTER — Ambulatory Visit: Payer: Self-pay

## 2016-08-18 ENCOUNTER — Ambulatory Visit: Payer: Self-pay

## 2016-08-25 ENCOUNTER — Ambulatory Visit: Payer: Self-pay

## 2016-09-01 ENCOUNTER — Other Ambulatory Visit (HOSPITAL_COMMUNITY)
Admission: RE | Admit: 2016-09-01 | Discharge: 2016-09-01 | Disposition: A | Discharge status: Home / Self Care | Payer: Medicaid Other | Source: Ambulatory Visit | Attending: Obstetrics and Gynecology | Admitting: Obstetrics and Gynecology

## 2016-09-01 ENCOUNTER — Ambulatory Visit (INDEPENDENT_AMBULATORY_CARE_PROVIDER_SITE_OTHER): Payer: Medicaid Other | Admitting: Obstetrics and Gynecology

## 2016-09-01 VITALS — BP 124/63 | HR 76 | Wt 178.1 lb

## 2016-09-01 DIAGNOSIS — Z3A00 Weeks of gestation of pregnancy not specified: Secondary | ICD-10-CM | POA: Diagnosis not present

## 2016-09-01 DIAGNOSIS — O26893 Other specified pregnancy related conditions, third trimester: Secondary | ICD-10-CM | POA: Insufficient documentation

## 2016-09-01 DIAGNOSIS — Z113 Encounter for screening for infections with a predominantly sexual mode of transmission: Secondary | ICD-10-CM | POA: Diagnosis not present

## 2016-09-01 DIAGNOSIS — O09213 Supervision of pregnancy with history of pre-term labor, third trimester: Secondary | ICD-10-CM

## 2016-09-01 DIAGNOSIS — O0992 Supervision of high risk pregnancy, unspecified, second trimester: Secondary | ICD-10-CM

## 2016-09-01 DIAGNOSIS — N898 Other specified noninflammatory disorders of vagina: Secondary | ICD-10-CM | POA: Diagnosis present

## 2016-09-01 DIAGNOSIS — O0993 Supervision of high risk pregnancy, unspecified, third trimester: Secondary | ICD-10-CM | POA: Diagnosis not present

## 2016-09-01 DIAGNOSIS — Z23 Encounter for immunization: Secondary | ICD-10-CM | POA: Diagnosis not present

## 2016-09-01 MED ORDER — HYDROXYPROGESTERONE CAPROATE 250 MG/ML IM OIL
250.0000 mg | TOPICAL_OIL | Freq: Once | INTRAMUSCULAR | Status: AC
  Administered 2016-09-01: 250 mg via INTRAMUSCULAR

## 2016-09-01 NOTE — Addendum Note (Signed)
Addended by: Clearnce SorrelPICKARD, Afsa Meany S on: 09/01/2016 10:28 AM   Modules accepted: Orders

## 2016-09-01 NOTE — Progress Notes (Signed)
Patient complains of increase of fluid and would like to be checked, she feel lots of pressure at night and early in the morning.

## 2016-09-01 NOTE — Progress Notes (Signed)
   PRENATAL VISIT NOTE  Subjective:  Ana [name] is a 26 y.o. [id number] at 1272w4d being seen today for ongoing prenatal care.  She is currently monitored for the following issues for this high-risk pregnancy and has Asthma, mild intermittent; Supervision of high risk pregnancy, antepartum, second trimester; and History of preterm delivery, currently pregnant on her problem list.  Patient reports patient is reporting some vaginal discharge with minimal itching as well as some pelvic pressure.  Contractions: Irregular. Vag. Bleeding: None.  Movement: Present. Denies leaking of fluid.   The following portions of the patient's history were reviewed and updated as appropriate: allergies, current medications, past family history, past medical history, past social history, past surgical history and problem list. Problem list updated.  Objective:   Vitals:   09/01/16 0935  BP: 124/63  Pulse: 76  Weight: 178 lb 1.6 oz (80.8 kg)    Fetal Status: Fetal Heart Rate (bpm): 130   Movement: Present     General:  Alert, oriented and cooperative. Patient is in no acute distress.  Skin: Skin is warm and dry. No rash noted.   Cardiovascular: Normal heart rate noted  Respiratory: Normal respiratory effort, no problems with respiration noted  Abdomen: Soft, gravid, appropriate for gestational age. Pain/Pressure: Present     Pelvic:  Cervical exam performed       1/T/H  Extremities: Normal range of motion.  Edema: Trace  Mental Status: Normal mood and affect. Normal behavior. Normal judgment and thought content.   Assessment and Plan:  Pregnancy: [id number] at 7772w4d  1. Supervision of high risk pregnancy, antepartum, second trimester - Tdap today -continue Makena-injection today patient has missed several injections recommend to patient to come weekly and emphasized importance.  - Cervicovaginal ancillary only  2. Vaginal discharge during pregnancy in third trimester - Cervicovaginal ancillary only,  GC/CT, yeast, trich, BV  Preterm labor symptoms and general obstetric precautions including but not limited to vaginal bleeding, contractions, leaking of fluid and fetal movement were reviewed in detail with the patient. Please refer to After Visit Summary for other counseling recommendations.  Return in about 2 weeks (around 09/15/2016) for HROB.   Ernestina PennaNicholas Cristian Grieves, MD

## 2016-09-01 NOTE — Patient Instructions (Signed)
Third Trimester of Pregnancy The third trimester is from week 28 through week 40 (months 7 through 9). The third trimester is a time when the unborn baby (fetus) is growing rapidly. At the end of the ninth month, the fetus is about 20 inches in length and weighs 6-10 pounds. Body changes during your third trimester Your body will continue to go through many changes during pregnancy. The changes vary from woman to woman. During the third trimester:  Your weight will continue to increase. You can expect to gain 25-35 pounds (11-16 kg) by the end of the pregnancy.  You may begin to get stretch marks on your hips, abdomen, and breasts.  You may urinate more often because the fetus is moving lower into your pelvis and pressing on your bladder.  You may develop or continue to have heartburn. This is caused by increased hormones that slow down muscles in the digestive tract.  You may develop or continue to have constipation because increased hormones slow digestion and cause the muscles that push waste through your intestines to relax.  You may develop hemorrhoids. These are swollen veins (varicose veins) in the rectum that can itch or be painful.  You may develop swollen, bulging veins (varicose veins) in your legs.  You may have increased body aches in the pelvis, back, or thighs. This is due to weight gain and increased hormones that are relaxing your joints.  You may have changes in your hair. These can include thickening of your hair, rapid growth, and changes in texture. Some women also have hair loss during or after pregnancy, or hair that feels dry or thin. Your hair will most likely return to normal after your baby is born.  Your breasts will continue to grow and they will continue to become tender. A yellow fluid (colostrum) may leak from your breasts. This is the first milk you are producing for your baby.  Your belly button may stick out.  You may notice more swelling in your hands,  face, or ankles.  You may have increased tingling or numbness in your hands, arms, and legs. The skin on your belly may also feel numb.  You may feel short of breath because of your expanding uterus.  You may have more problems sleeping. This can be caused by the size of your belly, increased need to urinate, and an increase in your body's metabolism.  You may notice the fetus "dropping," or moving lower in your abdomen (lightening).  You may have increased vaginal discharge.  You may notice your joints feel loose and you may have pain around your pelvic bone.  What to expect at prenatal visits You will have prenatal exams every 2 weeks until week 36. Then you will have weekly prenatal exams. During a routine prenatal visit:  You will be weighed to make sure you and the baby are growing normally.  Your blood pressure will be taken.  Your abdomen will be measured to track your baby's growth.  The fetal heartbeat will be listened to.  Any test results from the previous visit will be discussed.  You may have a cervical check near your due date to see if your cervix has softened or thinned (effaced).  You will be tested for Group B streptococcus. This happens between 35 and 37 weeks.  Your health care provider may ask you:  What your birth plan is.  How you are feeling.  If you are feeling the baby move.  If you have had   any abnormal symptoms, such as leaking fluid, bleeding, severe headaches, or abdominal cramping.  If you are using any tobacco products, including cigarettes, chewing tobacco, and electronic cigarettes.  If you have any questions.  Other tests or screenings that may be performed during your third trimester include:  Blood tests that check for low iron levels (anemia).  Fetal testing to check the health, activity level, and growth of the fetus. Testing is done if you have certain medical conditions or if there are problems during the  pregnancy.  Nonstress test (NST). This test checks the health of your baby to make sure there are no signs of problems, such as the baby not getting enough oxygen. During this test, a belt is placed around your belly. The baby is made to move, and its heart rate is monitored during movement.  What is false labor? False labor is a condition in which you feel small, irregular tightenings of the muscles in the womb (contractions) that usually go away with rest, changing position, or drinking water. These are called Braxton Hicks contractions. Contractions may last for hours, days, or even weeks before true labor sets in. If contractions come at regular intervals, become more frequent, increase in intensity, or become painful, you should see your health care provider. What are the signs of labor?  Abdominal cramps.  Regular contractions that start at 10 minutes apart and become stronger and more frequent with time.  Contractions that start on the top of the uterus and spread down to the lower abdomen and back.  Increased pelvic pressure and dull back pain.  A watery or bloody mucus discharge that comes from the vagina.  Leaking of amniotic fluid. This is also known as your "water breaking." It could be a slow trickle or a gush. Let your health care provider know if it has a color or strange odor. If you have any of these signs, call your health care provider right away, even if it is before your due date. Follow these instructions at home: Medicines  Follow your health care provider's instructions regarding medicine use. Specific medicines may be either safe or unsafe to take during pregnancy.  Take a prenatal vitamin that contains at least 600 micrograms (mcg) of folic acid.  If you develop constipation, try taking a stool softener if your health care provider approves. Eating and drinking  Eat a balanced diet that includes fresh fruits and vegetables, whole grains, good sources of protein  such as meat, eggs, or tofu, and low-fat dairy. Your health care provider will help you determine the amount of weight gain that is right for you.  Avoid raw meat and uncooked cheese. These carry germs that can cause birth defects in the baby.  If you have low calcium intake from food, talk to your health care provider about whether you should take a daily calcium supplement.  Eat four or five small meals rather than three large meals a day.  Limit foods that are high in fat and processed sugars, such as fried and sweet foods.  To prevent constipation: ? Drink enough fluid to keep your urine clear or pale yellow. ? Eat foods that are high in fiber, such as fresh fruits and vegetables, whole grains, and beans. Activity  Exercise only as directed by your health care provider. Most women can continue their usual exercise routine during pregnancy. Try to exercise for 30 minutes at least 5 days a week. Stop exercising if you experience uterine contractions.  Avoid heavy   lifting.  Do not exercise in extreme heat or humidity, or at high altitudes.  Wear low-heel, comfortable shoes.  Practice good posture.  You may continue to have sex unless your health care provider tells you otherwise. Relieving pain and discomfort  Take frequent breaks and rest with your legs elevated if you have leg cramps or low back pain.  Take warm sitz baths to soothe any pain or discomfort caused by hemorrhoids. Use hemorrhoid cream if your health care provider approves.  Wear a good support bra to prevent discomfort from breast tenderness.  If you develop varicose veins: ? Wear support pantyhose or compression stockings as told by your healthcare provider. ? Elevate your feet for 15 minutes, 3-4 times a day. Prenatal care  Write down your questions. Take them to your prenatal visits.  Keep all your prenatal visits as told by your health care provider. This is important. Safety  Wear your seat belt at  all times when driving.  Make a list of emergency phone numbers, including numbers for family, friends, the hospital, and police and fire departments. General instructions  Avoid cat litter boxes and soil used by cats. These carry germs that can cause birth defects in the baby. If you have a cat, ask someone to clean the litter box for you.  Do not travel far distances unless it is absolutely necessary and only with the approval of your health care provider.  Do not use hot tubs, steam rooms, or saunas.  Do not drink alcohol.  Do not use any products that contain nicotine or tobacco, such as cigarettes and e-cigarettes. If you need help quitting, ask your health care provider.  Do not use any medicinal herbs or unprescribed drugs. These chemicals affect the formation and growth of the baby.  Do not douche or use tampons or scented sanitary pads.  Do not cross your legs for long periods of time.  To prepare for the arrival of your baby: ? Take prenatal classes to understand, practice, and ask questions about labor and delivery. ? Make a trial run to the hospital. ? Visit the hospital and tour the maternity area. ? Arrange for maternity or paternity leave through employers. ? Arrange for family and friends to take care of pets while you are in the hospital. ? Purchase a rear-facing car seat and make sure you know how to install it in your car. ? Pack your hospital bag. ? Prepare the baby's nursery. Make sure to remove all pillows and stuffed animals from the baby's crib to prevent suffocation.  Visit your dentist if you have not gone during your pregnancy. Use a soft toothbrush to brush your teeth and be gentle when you floss. Contact a health care provider if:  You are unsure if you are in labor or if your water has broken.  You become dizzy.  You have mild pelvic cramps, pelvic pressure, or nagging pain in your abdominal area.  You have lower back pain.  You have persistent  nausea, vomiting, or diarrhea.  You have an unusual or bad smelling vaginal discharge.  You have pain when you urinate. Get help right away if:  Your water breaks before 37 weeks.  You have regular contractions less than 5 minutes apart before 37 weeks.  You have a fever.  You are leaking fluid from your vagina.  You have spotting or bleeding from your vagina.  You have severe abdominal pain or cramping.  You have rapid weight loss or weight gain.    You have shortness of breath with chest pain.  You notice sudden or extreme swelling of your face, hands, ankles, feet, or legs.  Your baby makes fewer than 10 movements in 2 hours.  You have severe headaches that do not go away when you take medicine.  You have vision changes. Summary  The third trimester is from week 28 through week 40, months 7 through 9. The third trimester is a time when the unborn baby (fetus) is growing rapidly.  During the third trimester, your discomfort may increase as you and your baby continue to gain weight. You may have abdominal, leg, and back pain, sleeping problems, and an increased need to urinate.  During the third trimester your breasts will keep growing and they will continue to become tender. A yellow fluid (colostrum) may leak from your breasts. This is the first milk you are producing for your baby.  False labor is a condition in which you feel small, irregular tightenings of the muscles in the womb (contractions) that eventually go away. These are called Braxton Hicks contractions. Contractions may last for hours, days, or even weeks before true labor sets in.  Signs of labor can include: abdominal cramps; regular contractions that start at 10 minutes apart and become stronger and more frequent with time; watery or bloody mucus discharge that comes from the vagina; increased pelvic pressure and dull back pain; and leaking of amniotic fluid. This information is not intended to replace advice  given to you by your health care provider. Make sure you discuss any questions you have with your health care provider. Document Released: 02/17/2001 Document Revised: 08/01/2015 Document Reviewed: 04/26/2012 Elsevier Interactive Patient Education  2017 [employer]  

## 2016-09-02 LAB — CERVICOVAGINAL ANCILLARY ONLY
Bacterial vaginitis: NEGATIVE
CANDIDA VAGINITIS: NEGATIVE
Chlamydia: NEGATIVE
NEISSERIA GONORRHEA: NEGATIVE
TRICH (WINDOWPATH): NEGATIVE

## 2016-09-18 ENCOUNTER — Ambulatory Visit (INDEPENDENT_AMBULATORY_CARE_PROVIDER_SITE_OTHER): Payer: Medicaid Other | Admitting: Obstetrics and Gynecology

## 2016-09-18 VITALS — BP 117/54 | HR 77 | Wt 179.6 lb

## 2016-09-18 DIAGNOSIS — O09899 Supervision of other high risk pregnancies, unspecified trimester: Secondary | ICD-10-CM

## 2016-09-18 DIAGNOSIS — Z3483 Encounter for supervision of other normal pregnancy, third trimester: Secondary | ICD-10-CM

## 2016-09-18 DIAGNOSIS — O09213 Supervision of pregnancy with history of pre-term labor, third trimester: Secondary | ICD-10-CM

## 2016-09-18 DIAGNOSIS — O0992 Supervision of high risk pregnancy, unspecified, second trimester: Secondary | ICD-10-CM

## 2016-09-18 DIAGNOSIS — O09219 Supervision of pregnancy with history of pre-term labor, unspecified trimester: Principal | ICD-10-CM

## 2016-09-18 NOTE — Progress Notes (Signed)
Subjective:  Ana [name] is a 26 y.o. [id number] at 11109w0d being seen today for ongoing prenatal care.  She is currently monitored for the following issues for this high-risk pregnancy and has Asthma, mild intermittent; Supervision of high risk pregnancy, antepartum, second trimester; and History of preterm delivery, currently pregnant on her problem list.  Patient reports general discomforts of pregnancy.  Contractions: Irregular. Vag. Bleeding: None.  Movement: Present. Denies leaking of fluid.   The following portions of the patient's history were reviewed and updated as appropriate: allergies, current medications, past family history, past medical history, past social history, past surgical history and problem list. Problem list updated.  Objective:   Vitals:   09/18/16 1124  BP: (!) 117/54  Pulse: 77  Weight: 179 lb 9.6 oz (81.5 kg)    Fetal Status: Fetal Heart Rate (bpm): 140   Movement: Present     General:  Alert, oriented and cooperative. Patient is in no acute distress.  Skin: Skin is warm and dry. No rash noted.   Cardiovascular: Normal heart rate noted  Respiratory: Normal respiratory effort, no problems with respiration noted  Abdomen: Soft, gravid, appropriate for gestational age. Pain/Pressure: Present     Pelvic:  Cervical exam deferred        Extremities: Normal range of motion.     Mental Status: Normal mood and affect. Normal behavior. Normal judgment and thought content.   Urinalysis:      Assessment and Plan:  Pregnancy: [id number] at 56109w0d  1. History of preterm delivery, currently pregnant Stable Continue with weekly 17 OHP  2. Supervision of high risk pregnancy, antepartum, second trimester Stable Comfort measures reviewed with pt.  Preterm labor symptoms and general obstetric precautions including but not limited to vaginal bleeding, contractions, leaking of fluid and fetal movement were reviewed in detail with the patient. Please refer to After Visit  Summary for other counseling recommendations.  Return in about 2 weeks (around 10/02/2016) for OB visit.   Hermina StaggersErvin, Marixa Mellott L, MD

## 2016-09-25 ENCOUNTER — Ambulatory Visit: Payer: Self-pay

## 2016-09-25 ENCOUNTER — Telehealth: Payer: Self-pay

## 2016-09-25 DIAGNOSIS — R110 Nausea: Secondary | ICD-10-CM

## 2016-09-25 MED ORDER — ONDANSETRON HCL 8 MG PO TABS: 8.0000 mg | ORAL_TABLET | Freq: Three times a day (TID) | ORAL | 0 refills | Status: DC | PRN

## 2016-09-27 ENCOUNTER — Inpatient Hospital Stay (HOSPITAL_COMMUNITY)
Admission: AD | Admit: 2016-09-27 | Discharge: 2016-09-27 | Disposition: A | Discharge status: Home / Self Care | Payer: Medicaid Other | Source: Ambulatory Visit | Attending: Obstetrics & Gynecology | Admitting: Obstetrics & Gynecology

## 2016-09-27 ENCOUNTER — Encounter (HOSPITAL_COMMUNITY): Payer: Self-pay | Admitting: *Deleted

## 2016-09-27 DIAGNOSIS — Z3A35 35 weeks gestation of pregnancy: Secondary | ICD-10-CM | POA: Diagnosis not present

## 2016-09-27 DIAGNOSIS — O09219 Supervision of pregnancy with history of pre-term labor, unspecified trimester: Secondary | ICD-10-CM

## 2016-09-27 DIAGNOSIS — B373 Candidiasis of vulva and vagina: Secondary | ICD-10-CM | POA: Insufficient documentation

## 2016-09-27 DIAGNOSIS — Z87891 Personal history of nicotine dependence: Secondary | ICD-10-CM | POA: Insufficient documentation

## 2016-09-27 DIAGNOSIS — O98813 Other maternal infectious and parasitic diseases complicating pregnancy, third trimester: Secondary | ICD-10-CM | POA: Diagnosis not present

## 2016-09-27 DIAGNOSIS — B379 Candidiasis, unspecified: Secondary | ICD-10-CM | POA: Diagnosis not present

## 2016-09-27 DIAGNOSIS — M549 Dorsalgia, unspecified: Secondary | ICD-10-CM

## 2016-09-27 DIAGNOSIS — O0992 Supervision of high risk pregnancy, unspecified, second trimester: Secondary | ICD-10-CM

## 2016-09-27 DIAGNOSIS — O99891 Other specified diseases and conditions complicating pregnancy: Secondary | ICD-10-CM

## 2016-09-27 DIAGNOSIS — O1203 Gestational edema, third trimester: Secondary | ICD-10-CM | POA: Diagnosis present

## 2016-09-27 DIAGNOSIS — O9989 Other specified diseases and conditions complicating pregnancy, childbirth and the puerperium: Secondary | ICD-10-CM | POA: Diagnosis not present

## 2016-09-27 DIAGNOSIS — O09899 Supervision of other high risk pregnancies, unspecified trimester: Secondary | ICD-10-CM

## 2016-09-27 LAB — URINALYSIS, ROUTINE W REFLEX MICROSCOPIC
BILIRUBIN URINE: NEGATIVE
GLUCOSE, UA: NEGATIVE mg/dL
HGB URINE DIPSTICK: NEGATIVE
KETONES UR: NEGATIVE mg/dL
Nitrite: NEGATIVE
PH: 7 (ref 5.0–8.0)
Protein, ur: NEGATIVE mg/dL
RBC / HPF: NONE SEEN RBC/hpf (ref 0–5)
Specific Gravity, Urine: 1.015 (ref 1.005–1.030)

## 2016-09-27 LAB — RAPID URINE DRUG SCREEN, HOSP PERFORMED
Amphetamines: NOT DETECTED
Barbiturates: NOT DETECTED
Benzodiazepines: NOT DETECTED
Cocaine: NOT DETECTED
Opiates: NOT DETECTED
Tetrahydrocannabinol: POSITIVE — AB

## 2016-09-27 LAB — WET PREP, GENITAL
Clue Cells Wet Prep HPF POC: NONE SEEN
Sperm: NONE SEEN
Trich, Wet Prep: NONE SEEN
Yeast Wet Prep HPF POC: NONE SEEN

## 2016-09-27 MED ORDER — TERCONAZOLE 0.4 % VA CREA: 1.0000 | TOPICAL_CREAM | Freq: Every day | VAGINAL | 0 refills | Status: DC

## 2016-09-27 MED ORDER — ACETAMINOPHEN 325 MG PO TABS
650.0000 mg | ORAL_TABLET | Freq: Once | ORAL | Status: DC
  Filled 2016-09-27: qty 2

## 2016-09-27 NOTE — MAU Provider Note (Signed)
History     CSN: [id number]  Arrival date and time: 09/27/16 1258   First Provider Initiated Contact with Patient 09/27/16 1342      No chief complaint on file.  HPI Ana [name] 26 y.o.  [redacted]w[redacted]d  Came in due to swelling in her ankles and thighs.  Does not feel well - having back pain and unable to sleep well.  Unable to eat - is hungry and tries to eat but then cannot swallow her food.  Currently is getting 17P shots in clinic downstairs.  Also feels like vulva is swollen and is changing her underwear 3 times a day as it is wet.  OB History    Gravida Para Term Preterm AB Living   8 5 5  0 2 5   SAB TAB Ectopic Multiple Live Births   2 0 0 0 5      Past Medical History:  Diagnosis Date  . Abnormal Pap smear   . Asthma   . Chlamydia Jan 2014  . Kidney infection   . Vaginal Pap smear, abnormal     Past Surgical History:  Procedure Laterality Date  . WISDOM TOOTH EXTRACTION      Family History  Problem Relation Age of Onset  . Hypertension Mother   . Hypertension Maternal Grandmother     Social History  Substance Use Topics  . Smoking status: Former Smoker    Quit date: 04/25/2014  . Smokeless tobacco: Never Used  . Alcohol use No     Comment: occasional/not since pregnancy    Allergies: No Known Allergies  Facility-Administered Medications Prior to Admission  Medication Dose Route Frequency Provider Last Rate Last Dose  . hydroxyprogesterone caproate (MAKENA) 250 mg/mL injection 250 mg  250 mg Intramuscular Weekly Adam Phenix, MD   250 mg at 09/18/16 1132   Prescriptions Prior to Admission  Medication Sig Dispense Refill Last Dose  . calcium carbonate (TUMS - DOSED IN MG ELEMENTAL CALCIUM) 500 MG chewable tablet Chew 2 tablets by mouth 2 (two) times daily as needed for indigestion or heartburn.   Not Taking  . ondansetron (ZOFRAN) 8 MG tablet Take 1 tablet (8 mg total) by mouth every 8 (eight) hours as needed for nausea or vomiting. 10 tablet 0   .  Prenatal Multivit-Min-Fe-FA (PRENATAL VITAMINS) 0.8 MG tablet Take 1 tablet by mouth daily. 30 tablet 2 Taking  . PROVENTIL HFA 108 (90 Base) MCG/ACT inhaler INHALE 2 PUFFS INTO THE LUNGS EVERY 6 HOURS AS NEEDED FOR WHEEZING OR SHORTNESS OF BREATH (Patient not taking: Reported on 09/18/2016) 6.7 g 0 Not Taking    Review of Systems  Constitutional: Negative for fever.  Gastrointestinal: Positive for abdominal pain.  Genitourinary: Positive for vaginal discharge. Negative for dysuria.       Vulva irritated  Musculoskeletal: Positive for back pain.       Edema in hands, thighs and ankles, change in size of her nose.  Neurological: Negative for headaches.   Physical Exam   Blood pressure (!) 125/58, pulse 77, temperature 98.4 F (36.9 C), temperature source Oral, resp. rate 20, height 5\' 4"  (1.626 m), weight 181 lb (82.1 kg), last menstrual period 12/29/2015, SpO2 100 %, unknown if currently breastfeeding.  Physical Exam  Nursing note and vitals reviewed. Constitutional: She is oriented to person, place, and time. She appears well-developed and well-nourished.  HENT:  Head: Normocephalic.  Eyes: EOM are normal.  Neck: Neck supple.  GI: Soft. There is no tenderness. There is  no rebound and no guarding.  No contractions palpated or seen on monitor strip. FHT 135 with moderate variability and 15x15 accels - reactive.  Genitourinary:  Genitourinary Comments: Speculum exam: Vulva - wrinkled, dry, no peeling, Vagina -some erythema, Mod amount of white discharge, no odor Cervix - No contact bleeding Bimanual exam: Cervix closed and soft, baby's head is high wet prep done Chaperone present for exam.   Musculoskeletal: Normal range of motion.  No swelling seen in ankles at all.  No swelling in legs noted.  Neurological: She is alert and oriented to person, place, and time.  Skin: Skin is warm and dry.  Psychiatric: She has a normal mood and affect.    MAU Course  Procedures Results  for orders placed or performed during the hospital encounter of 09/27/16 (from the past 24 hour(s))  Urinalysis, Routine w reflex microscopic     Status: Abnormal   Collection Time: 09/27/16  1:05 PM  Result Value Ref Range   Color, Urine YELLOW YELLOW   APPearance CLEAR CLEAR   Specific Gravity, Urine 1.015 1.005 - 1.030   pH 7.0 5.0 - 8.0   Glucose, UA NEGATIVE NEGATIVE mg/dL   Hgb urine dipstick NEGATIVE NEGATIVE   Bilirubin Urine NEGATIVE NEGATIVE   Ketones, ur NEGATIVE NEGATIVE mg/dL   Protein, ur NEGATIVE NEGATIVE mg/dL   Nitrite NEGATIVE NEGATIVE   Leukocytes, UA MODERATE (A) NEGATIVE   RBC / HPF NONE SEEN 0 - 5 RBC/hpf   WBC, UA 0-5 0 - 5 WBC/hpf   Bacteria, UA RARE (A) NONE SEEN   Squamous Epithelial / LPF 0-5 (A) NONE SEEN   Mucous PRESENT   Wet prep, genital     Status: Abnormal   Collection Time: 09/27/16  1:55 PM  Result Value Ref Range   Yeast Wet Prep HPF POC NONE SEEN NONE SEEN   Trich, Wet Prep NONE SEEN NONE SEEN   Clue Cells Wet Prep HPF POC NONE SEEN NONE SEEN   WBC, Wet Prep HPF POC MANY (A) NONE SEEN   Sperm NONE SEEN     MDM Based on the appearance of the vulva and vagina, there may be a yeast infection.  Will treat for yeast infection.  Client declines tylenol for back pain here today.  Discussed alternating heat and ice to decrease pain.  Discussed the yeast infection may be causing the increase in vaginal discharge.    Could hear client yelling on the phone - was unhappy with her care.  Went in to discuss discharge plan.  Client very irritated and upset with the provider and her care today.  Was unhappy with being offered Tylenol 650 mg for pain and was unhappy with being treated for possible yeast.  Tried to discuss with her but every statement only made her more irritable.  Smiled at her and client said I was laughing at her.  Expressed that I was not laughing but was wanting to help her.  Client asked for another person to come and talk with her.  House  coverage notified and came along with security.  Dr. Erin FullingHarraway Smith notified of client's dissatisfaction.  Could hear patient yelling at the security guard and house coverage.  Patient walked out without signing for discharge.  Was communicating threats to staff.  Was yelling in the hallway and in the lobby.  Client paced outside the building and would come back inside yelling.  911 called and police came.  Patient stood outside and talked to police with  house coverage and security guard there.  Assessment and Plan  [redacted] week gestation continuing on 17P Yeast infection  Plan Keep your appointments in the clinic. Get your medication at the pharmacy and use as directed. Return if you experience vaginal bleeding or leaking of clear fluid. Drink at least 8 8-oz glasses of water every day. Take Tylenol 325 mg 2 tablets by mouth every 4 hours if needed for pain. Take a stool softener if you are not having bowel movements regularly.  Terri L Burleson 09/27/2016, 1:47 PM

## 2016-09-27 NOTE — MAU Note (Signed)
Not comfortable all week, poor appetite, pain in lower stomach and back, swelling in feet and hands

## 2016-09-27 NOTE — Discharge Instructions (Signed)
Keep your appointments in the clinic. Get your medication at the pharmacy and use as directed. Return if you experience vaginal bleeding or leaking of clear fluid. Drink at least 8 8-oz glasses of water every day. Take Tylenol 325 mg 2 tablets by mouth every 4 hours if needed for pain.

## 2016-09-27 NOTE — Progress Notes (Signed)
Approaching room to give patient her discharge instructions.  Heard patient screaming and yelling at the provider who was in the room alone.  Patient was stating "  Yaw haven't done a f........ Thing for me.  I don't feel good and everybody is telling me there is nothing wrong with me."  [facility] called to room and Dr. Jodi MarbleHarrraway-Smith was also present in the unit.  Patient began yelling and threatening AC and security guard.  [employer] was called.  Patient left room yelling and screaming at staff and stating " she needs to be fired."  Patient refused to sign discharge instructions.  Left MAU and proceeded to yell and scream in lobby and in parking lot outside MAU.   [employer] arrived and spoke with patient outside and patient continued to yell and scream at [facility].

## 2016-09-28 ENCOUNTER — Ambulatory Visit (INDEPENDENT_AMBULATORY_CARE_PROVIDER_SITE_OTHER): Payer: Medicaid Other | Admitting: Family Medicine

## 2016-09-28 ENCOUNTER — Telehealth: Payer: Self-pay | Admitting: General Practice

## 2016-09-28 ENCOUNTER — Encounter: Payer: Self-pay | Admitting: Family Medicine

## 2016-09-28 VITALS — BP 131/70 | HR 79 | Wt 179.9 lb

## 2016-09-28 DIAGNOSIS — O0992 Supervision of high risk pregnancy, unspecified, second trimester: Secondary | ICD-10-CM

## 2016-09-28 DIAGNOSIS — O0993 Supervision of high risk pregnancy, unspecified, third trimester: Secondary | ICD-10-CM

## 2016-09-28 DIAGNOSIS — O09219 Supervision of pregnancy with history of pre-term labor, unspecified trimester: Secondary | ICD-10-CM

## 2016-09-28 DIAGNOSIS — Z3483 Encounter for supervision of other normal pregnancy, third trimester: Secondary | ICD-10-CM | POA: Diagnosis not present

## 2016-09-28 DIAGNOSIS — O09213 Supervision of pregnancy with history of pre-term labor, third trimester: Secondary | ICD-10-CM | POA: Diagnosis not present

## 2016-09-28 DIAGNOSIS — O09899 Supervision of other high risk pregnancies, unspecified trimester: Secondary | ICD-10-CM

## 2016-09-28 NOTE — Telephone Encounter (Signed)
Patient called into front office stating she feels terrible this morning. Patient states she had a horrible experience yesterday in MAU. Patient states they were trying to give her medication that she didn't need like for a yeast infection and tylenol. Patient states it was so bad she had to call the police and she just didn't have a good experience with the security guard or the nurses either. Patient states she just feels awful. States she is having horrible pelvic pain and pressure that feels like something is ripping apart. Also reports back pain & swelling in legs and feet. Patient also reports she has been having headaches. Patient states she thinks her experience yesterday is making her feel worse. Patient states tylenol isn't helping. Per Dr Vergie LivingPickens, patient should come in for appt this week. Told patient someone from our front office staff will call her right back with an appointment for today. Patient was tearful, verbalized understanding & had no questions

## 2016-09-28 NOTE — Progress Notes (Signed)
   PRENATAL VISIT NOTE  Subjective:  Ana [name] is a 26 y.o. [id number] at 5365w3d being seen today for ongoing prenatal care.  She is currently monitored for the following issues for this high-risk pregnancy and has Asthma, mild intermittent; Supervision of high risk pregnancy, antepartum, second trimester; and History of preterm delivery, currently pregnant on her problem list.  Patient reports abdominal pain, cramping..  Contractions: Irregular. Vag. Bleeding: None.  Movement: Present. Denies leaking of fluid.   The following portions of the patient's history were reviewed and updated as appropriate: allergies, current medications, past family history, past medical history, past social history, past surgical history and problem list. Problem list updated.  Objective:   Vitals:   09/28/16 1247  BP: 131/70  Pulse: 79  Weight: 179 lb 14.4 oz (81.6 kg)    Fetal Status: Fetal Heart Rate (bpm): 139 Fundal Height: 35 cm Movement: Present  Presentation: Vertex  General:  Alert, oriented and cooperative. Patient is in no acute distress.  Skin: Skin is warm and dry. No rash noted.   Cardiovascular: Normal heart rate noted  Respiratory: Normal respiratory effort, no problems with respiration noted  Abdomen: Soft, gravid, appropriate for gestational age.  Pain/Pressure: Present     Pelvic: Cervical exam performed Dilation: 3 Effacement (%): 50 Station: -3  Extremities: Normal range of motion.  Edema: Trace  Mental Status:  Normal mood and affect. Normal behavior. Normal judgment and thought content.   Assessment and Plan:  Pregnancy: [id number] at 7565w3d  1. Supervision of high risk pregnancy, antepartum, second trimester FHT and FH normal  2. History of preterm delivery, currently pregnant 17-P  Preterm labor symptoms and general obstetric precautions including but not limited to vaginal bleeding, contractions, leaking of fluid and fetal movement were reviewed in detail with the  patient. Please refer to After Visit Summary for other counseling recommendations.  No Follow-up on file.   Levie HeritageJacob J Stinson, DO

## 2016-10-01 ENCOUNTER — Other Ambulatory Visit: Payer: Self-pay | Admitting: Obstetrics & Gynecology

## 2016-10-01 DIAGNOSIS — O09899 Supervision of other high risk pregnancies, unspecified trimester: Secondary | ICD-10-CM

## 2016-10-01 DIAGNOSIS — O09219 Supervision of pregnancy with history of pre-term labor, unspecified trimester: Principal | ICD-10-CM

## 2016-10-02 ENCOUNTER — Inpatient Hospital Stay (HOSPITAL_COMMUNITY)
Admission: AD | Admit: 2016-10-02 | Discharge: 2016-10-03 | Disposition: A | Discharge status: Home / Self Care | Payer: Medicaid Other | Source: Ambulatory Visit | Attending: Obstetrics and Gynecology | Admitting: Obstetrics and Gynecology

## 2016-10-02 DIAGNOSIS — J45909 Unspecified asthma, uncomplicated: Secondary | ICD-10-CM | POA: Insufficient documentation

## 2016-10-02 DIAGNOSIS — Z8619 Personal history of other infectious and parasitic diseases: Secondary | ICD-10-CM | POA: Insufficient documentation

## 2016-10-02 DIAGNOSIS — Z87891 Personal history of nicotine dependence: Secondary | ICD-10-CM | POA: Insufficient documentation

## 2016-10-02 DIAGNOSIS — Z8249 Family history of ischemic heart disease and other diseases of the circulatory system: Secondary | ICD-10-CM | POA: Insufficient documentation

## 2016-10-02 DIAGNOSIS — Z9889 Other specified postprocedural states: Secondary | ICD-10-CM | POA: Insufficient documentation

## 2016-10-02 DIAGNOSIS — O479 False labor, unspecified: Secondary | ICD-10-CM

## 2016-10-02 DIAGNOSIS — Z3A36 36 weeks gestation of pregnancy: Secondary | ICD-10-CM | POA: Insufficient documentation

## 2016-10-02 DIAGNOSIS — O4703 False labor before 37 completed weeks of gestation, third trimester: Secondary | ICD-10-CM | POA: Insufficient documentation

## 2016-10-02 DIAGNOSIS — Z0371 Encounter for suspected problem with amniotic cavity and membrane ruled out: Secondary | ICD-10-CM

## 2016-10-02 DIAGNOSIS — O99513 Diseases of the respiratory system complicating pregnancy, third trimester: Secondary | ICD-10-CM | POA: Insufficient documentation

## 2016-10-02 NOTE — MAU Note (Signed)
Contractions all day and stronger tonight. Leaking fld since 2200=clear. Baby not moving as much as usual today. 3cm on Monday

## 2016-10-03 ENCOUNTER — Encounter (HOSPITAL_COMMUNITY): Payer: Self-pay

## 2016-10-03 DIAGNOSIS — J45909 Unspecified asthma, uncomplicated: Secondary | ICD-10-CM | POA: Diagnosis not present

## 2016-10-03 DIAGNOSIS — O4703 False labor before 37 completed weeks of gestation, third trimester: Secondary | ICD-10-CM | POA: Diagnosis not present

## 2016-10-03 DIAGNOSIS — Z8249 Family history of ischemic heart disease and other diseases of the circulatory system: Secondary | ICD-10-CM | POA: Diagnosis not present

## 2016-10-03 DIAGNOSIS — Z8619 Personal history of other infectious and parasitic diseases: Secondary | ICD-10-CM | POA: Diagnosis not present

## 2016-10-03 DIAGNOSIS — Z3A36 36 weeks gestation of pregnancy: Secondary | ICD-10-CM | POA: Diagnosis not present

## 2016-10-03 DIAGNOSIS — Z9889 Other specified postprocedural states: Secondary | ICD-10-CM | POA: Diagnosis not present

## 2016-10-03 DIAGNOSIS — N898 Other specified noninflammatory disorders of vagina: Secondary | ICD-10-CM | POA: Diagnosis present

## 2016-10-03 DIAGNOSIS — R109 Unspecified abdominal pain: Secondary | ICD-10-CM | POA: Diagnosis present

## 2016-10-03 DIAGNOSIS — O99513 Diseases of the respiratory system complicating pregnancy, third trimester: Secondary | ICD-10-CM | POA: Diagnosis not present

## 2016-10-03 DIAGNOSIS — Z87891 Personal history of nicotine dependence: Secondary | ICD-10-CM | POA: Diagnosis not present

## 2016-10-03 LAB — AMNISURE RUPTURE OF MEMBRANE (ROM) NOT AT ARMC: Amnisure ROM: NEGATIVE

## 2016-10-03 MED ORDER — OXYCODONE-ACETAMINOPHEN 5-325 MG PO TABS
1.0000 | ORAL_TABLET | Freq: Once | ORAL | Status: AC
  Administered 2016-10-03: 1 via ORAL
  Filled 2016-10-03: qty 1

## 2016-10-03 MED ORDER — ONDANSETRON 8 MG PO TBDP
8.0000 mg | ORAL_TABLET | Freq: Once | ORAL | Status: AC
  Administered 2016-10-03: 8 mg via ORAL
  Filled 2016-10-03: qty 1

## 2016-10-03 NOTE — MAU Provider Note (Signed)
History     CSN: [id number]  Arrival date and time: 10/02/16 2334   First Provider Initiated Contact with Patient 10/03/16 0010      Chief Complaint  Patient presents with  . Vaginal Discharge  . Contractions   HPI   [name] is a 26 y.o. female [phone] @ 5526w1d here in MAU with complaints of leaking of fluid and contractions. Contractions have been going on all day. She took tylenol this morning which did not help. The contractions come and go and she feels they are regular. Around 2200 she felt a gush of clear fluid. Denies vaginal bleeding. + fetal movement, however decreased.   OB History    Gravida Para Term Preterm AB Living   8 5 5  0 2 5   SAB TAB Ectopic Multiple Live Births   2 0 0 0 5      Past Medical History:  Diagnosis Date  . Abnormal Pap smear   . Asthma   . Chlamydia Jan 2014  . Kidney infection   . Vaginal Pap smear, abnormal     Past Surgical History:  Procedure Laterality Date  . WISDOM TOOTH EXTRACTION      Family History  Problem Relation Age of Onset  . Hypertension Mother   . Hypertension Maternal Grandmother     Social History  Substance Use Topics  . Smoking status: Former Smoker    Quit date: 04/25/2014  . Smokeless tobacco: Never Used  . Alcohol use No     Comment: occasional/not since pregnancy    Allergies: No Known Allergies  Facility-Administered Medications Prior to Admission  Medication Dose Route Frequency Provider Last Rate Last Dose  . hydroxyprogesterone caproate (MAKENA) 250 mg/mL injection 250 mg  250 mg Intramuscular Weekly Adam PhenixArnold, James G, MD   250 mg at 09/28/16 1329   Prescriptions Prior to Admission  Medication Sig Dispense Refill Last Dose  . calcium carbonate (TUMS - DOSED IN MG ELEMENTAL CALCIUM) 500 MG chewable tablet Chew 2 tablets by mouth 2 (two) times daily as needed for indigestion or heartburn.   Not Taking  . ondansetron (ZOFRAN) 8 MG tablet Take 1 tablet (8 mg total) by mouth every 8 (eight)  hours as needed for nausea or vomiting. 10 tablet 0 Taking  . Prenatal Vit-Fe Fumarate-FA (PRENATAL VITAMINS) 28-0.8 MG TABS TAKE 1 TABLET BY MOUTH DAILY 30 tablet 5   . PROVENTIL HFA 108 (90 Base) MCG/ACT inhaler INHALE 2 PUFFS INTO THE LUNGS EVERY 6 HOURS AS NEEDED FOR WHEEZING OR SHORTNESS OF BREATH 6.7 g 0 Taking  . terconazole (TERAZOL 7) 0.4 % vaginal cream Place 1 applicator vaginally at bedtime. Use for 7 days. (Patient not taking: Reported on 09/28/2016) 45 g 0 Not Taking   Results for orders placed or performed during the hospital encounter of 10/02/16 (from the past 48 hour(s))  Am[facility] ([facility]     Status: None   Collection Time: 10/03/16 12:15 AM  Result Value Ref Range   Amnisure ROM NEGATIVE     Review of Systems  Gastrointestinal: Positive for abdominal pain, nausea and vomiting.   Physical Exam   Blood pressure (!) 123/52, pulse 64, temperature 98.3 F (36.8 C), temperature source Oral, resp. rate 18, height 5\' 4"  (1.626 m), weight 182 lb (82.6 kg), last menstrual period 12/29/2015, unknown if currently breastfeeding.  Physical Exam  Constitutional: She is oriented to person, place, and time. She appears well-developed and well-nourished. No distress.  HENT:  Head: Normocephalic.  Respiratory: Effort normal and breath sounds normal.  Genitourinary:  Genitourinary Comments: Dilation: 2.5 Effacement (%): 50 Station: -3 Exam by:: Ciscomber Stovall RN  Neurological: She is alert and oriented to person, place, and time.  Skin: Skin is warm. She is not diaphoretic.  Psychiatric: Her behavior is normal.   Fetal Tracing: Baseline: 125 bpm Variability: Moderate  Accelerations: 15x15 Decelerations: None Toco: Q3-4  MAU Course  Procedures  None  MDM  Fern slide: negative  Amnisure: negative  Zofran for nausea, patient requested.  Cervix unchanged from prior exam. Ok for discharge home Percocet given prior to discharge per patient  requests.   Assessment and Plan   A:    ICD-10-CM   1. False labor O47.9   2. Encounter for suspected premature rupture of amniotic membranes, with rupture of membranes not found Z03.71   3. Braxton Hicks contractions O47.9     P:  Discharge home in stable condition Preterm labor precautions Return to MAU if symptoms worsen  Follow up with the [facility] as scheduled or sooner if needed   Venia Carbonasch, Jennifer I, NP 10/03/2016 12:16 AM

## 2016-10-03 NOTE — MAU Note (Signed)
Urine sent to lab 

## 2016-10-05 ENCOUNTER — Encounter: Payer: Self-pay | Admitting: Obstetrics & Gynecology

## 2016-10-05 ENCOUNTER — Ambulatory Visit (INDEPENDENT_AMBULATORY_CARE_PROVIDER_SITE_OTHER): Payer: Medicaid Other | Admitting: Obstetrics and Gynecology

## 2016-10-05 ENCOUNTER — Encounter: Payer: Self-pay | Admitting: General Practice

## 2016-10-05 ENCOUNTER — Telehealth: Payer: Self-pay | Admitting: *Deleted

## 2016-10-05 ENCOUNTER — Other Ambulatory Visit (HOSPITAL_COMMUNITY)
Admission: RE | Admit: 2016-10-05 | Discharge: 2016-10-05 | Disposition: A | Discharge status: Home / Self Care | Payer: Medicaid Other | Source: Ambulatory Visit | Attending: Obstetrics and Gynecology | Admitting: Obstetrics and Gynecology

## 2016-10-05 ENCOUNTER — Telehealth: Payer: Self-pay | Admitting: General Practice

## 2016-10-05 VITALS — BP 117/57 | HR 68 | Wt 180.0 lb

## 2016-10-05 DIAGNOSIS — O09212 Supervision of pregnancy with history of pre-term labor, second trimester: Secondary | ICD-10-CM | POA: Diagnosis not present

## 2016-10-05 DIAGNOSIS — Z3A36 36 weeks gestation of pregnancy: Secondary | ICD-10-CM | POA: Diagnosis not present

## 2016-10-05 DIAGNOSIS — O0992 Supervision of high risk pregnancy, unspecified, second trimester: Secondary | ICD-10-CM

## 2016-10-05 DIAGNOSIS — O09899 Supervision of other high risk pregnancies, unspecified trimester: Secondary | ICD-10-CM

## 2016-10-05 DIAGNOSIS — F1210 Cannabis abuse, uncomplicated: Secondary | ICD-10-CM | POA: Insufficient documentation

## 2016-10-05 DIAGNOSIS — O09219 Supervision of pregnancy with history of pre-term labor, unspecified trimester: Secondary | ICD-10-CM

## 2016-10-05 DIAGNOSIS — Z113 Encounter for screening for infections with a predominantly sexual mode of transmission: Secondary | ICD-10-CM | POA: Diagnosis not present

## 2016-10-05 DIAGNOSIS — O09213 Supervision of pregnancy with history of pre-term labor, third trimester: Secondary | ICD-10-CM | POA: Diagnosis present

## 2016-10-05 NOTE — Progress Notes (Signed)
Patient refuses last 17p today.

## 2016-10-05 NOTE — Telephone Encounter (Signed)
Patient left message, would like a nurse to return her call. Did not give any other info.

## 2016-10-05 NOTE — Progress Notes (Signed)
Subjective:  Ana [name] is a 26 y.o. [id number] at 492w3d being seen today for ongoing prenatal care.  She is currently monitored for the following issues for this high-risk pregnancy and has Asthma, mild intermittent; Supervision of high risk pregnancy, antepartum, second trimester; and History of preterm delivery, currently pregnant on her problem list.  Patient reports occasional contractions and and general discomforts of pregnancy.  Contractions: Irregular. Vag. Bleeding: None.  Movement: Present. Denies leaking of fluid.   The following portions of the patient's history were reviewed and updated as appropriate: allergies, current medications, past family history, past medical history, past social history, past surgical history and problem list. Problem list updated.  Objective:   Vitals:   10/05/16 1412  BP: (!) 117/57  Pulse: 68  Weight: 180 lb (81.6 kg)    Fetal Status: Fetal Heart Rate (bpm): 134   Movement: Present     General:  Alert, oriented and cooperative. Patient is in no acute distress.  Skin: Skin is warm and dry. No rash noted.   Cardiovascular: Normal heart rate noted  Respiratory: Normal respiratory effort, no problems with respiration noted  Abdomen: Soft, gravid, appropriate for gestational age. Pain/Pressure: Present     Pelvic:  Cervical exam performed        Extremities: Normal range of motion.  Edema: Trace  Mental Status: Normal mood and affect. Normal behavior. Normal judgment and thought content.   Urinalysis:      Assessment and Plan:  Pregnancy: [id number] at 842w3d  1. Supervision of high risk pregnancy, antepartum, second trimester Stable Labor precautions  2. History of preterm delivery, currently pregnant Declined 17 OHP today - Culture, beta strep (group b only) - GC/Chlamydia probe amp ([place])not at [facility]  Preterm labor symptoms and general obstetric precautions including but not limited to vaginal bleeding, contractions, leaking of fluid  and fetal movement were reviewed in detail with the patient. Please refer to After Visit Summary for other counseling recommendations.  Return in about 1 week (around 10/12/2016) for OB visit.   Hermina StaggersErvin, Quanah Majka L, MD

## 2016-10-05 NOTE — Telephone Encounter (Signed)
Offered to print AVS upon checking patient out. Patient stated that she will not be back for any of her appointments or coming back to hospital not until she have the baby.

## 2016-10-06 ENCOUNTER — Telehealth: Payer: Self-pay | Admitting: General Practice

## 2016-10-06 LAB — GC/CHLAMYDIA PROBE AMP (~~LOC~~) NOT AT ARMC
CHLAMYDIA, DNA PROBE: NEGATIVE
Neisseria Gonorrhea: NEGATIVE

## 2016-10-06 NOTE — Telephone Encounter (Signed)
Patient called into front office requesting drug abuse marijuana be taken off her problem list. Told patient I am unable to do that and she can discuss that at her next visit. Told patient it was put in there because she was positive on 09/27/16 on drug screen. Patient says if her probation officer sees that she'll be in trouble and cps will get involved. Told patient I am unable to remove It from her chart and the results would still be there even if I could. Patient verbalized understanding & had no questions

## 2016-10-08 LAB — CULTURE, BETA STREP (GROUP B ONLY): STREP GP B CULTURE: POSITIVE — AB

## 2016-10-12 ENCOUNTER — Ambulatory Visit (INDEPENDENT_AMBULATORY_CARE_PROVIDER_SITE_OTHER): Payer: Medicaid Other | Admitting: Obstetrics and Gynecology

## 2016-10-12 VITALS — BP 138/78 | HR 71 | Wt 180.0 lb

## 2016-10-12 DIAGNOSIS — O09212 Supervision of pregnancy with history of pre-term labor, second trimester: Secondary | ICD-10-CM

## 2016-10-12 DIAGNOSIS — O0992 Supervision of high risk pregnancy, unspecified, second trimester: Secondary | ICD-10-CM

## 2016-10-12 DIAGNOSIS — O09219 Supervision of pregnancy with history of pre-term labor, unspecified trimester: Secondary | ICD-10-CM

## 2016-10-12 DIAGNOSIS — O99820 Streptococcus B carrier state complicating pregnancy: Secondary | ICD-10-CM | POA: Insufficient documentation

## 2016-10-12 DIAGNOSIS — O09899 Supervision of other high risk pregnancies, unspecified trimester: Secondary | ICD-10-CM

## 2016-10-12 DIAGNOSIS — A059 Bacterial foodborne intoxication, unspecified: Secondary | ICD-10-CM | POA: Insufficient documentation

## 2016-10-12 DIAGNOSIS — IMO0001 Reserved for inherently not codable concepts without codable children: Secondary | ICD-10-CM

## 2016-10-12 MED ORDER — ONDANSETRON 4 MG PO TBDP: 4.0000 mg | ORAL_TABLET | Freq: Four times a day (QID) | ORAL | 0 refills | Status: DC | PRN

## 2016-10-12 NOTE — Progress Notes (Signed)
Subjective:  Ana [name] is a 26 y.o. [id number] at 6157w3d being seen today for ongoing prenatal care.  She is currently monitored for the following issues for this high-risk pregnancy and has Asthma, mild intermittent; Supervision of high risk pregnancy, antepartum, second trimester; History of preterm delivery, currently pregnant; Drug abuse, marijuana; GBS (group B Streptococcus carrier), +RV culture, currently pregnant; and Food poisoning on her problem list.  Patient reports some n/v from eating bad fish yesterday.  Contractions: Not present.  .  Movement: Present. Denies leaking of fluid.   The following portions of the patient's history were reviewed and updated as appropriate: allergies, current medications, past family history, past medical history, past social history, past surgical history and problem list. Problem list updated.  Objective:   Vitals:   10/12/16 1404  BP: 138/78  Pulse: 71  Weight: 180 lb (81.6 kg)    Fetal Status: Fetal Heart Rate (bpm): 136   Movement: Present     General:  Alert, oriented and cooperative. Patient is in no acute distress.  Skin: Skin is warm and dry. No rash noted.   Cardiovascular: Normal heart rate noted  Respiratory: Normal respiratory effort, no problems with respiration noted  Abdomen: Soft, gravid, appropriate for gestational age. Pain/Pressure: Present     Pelvic:  Cervical exam deferred        Extremities: Normal range of motion.     Mental Status: Normal mood and affect. Normal behavior. Normal judgment and thought content.   Urinalysis:      Assessment and Plan:  Pregnancy: [id number] at 10557w3d  1. Supervision of high risk pregnancy, antepartum, second trimester Stable  2. History of preterm delivery, currently pregnant Has completed   3. GBS (group B Streptococcus carrier), +RV culture, currently pregnant Tx while  4. Food poisoning, accidental or unintentional, initial encounter  - ondansetron (ZOFRAN ODT) 4 MG  disintegrating tablet; Take 1 tablet (4 mg total) by mouth every 6 (six) hours as needed for nausea.  Dispense: 20 tablet; Refill: 0  Term labor symptoms and general obstetric precautions including but not limited to vaginal bleeding, contractions, leaking of fluid and fetal movement were reviewed in detail with the patient. Please refer to After Visit Summary for other counseling recommendations.  Return in about 1 week (around 10/19/2016) for OB visit.   Hermina StaggersErvin, [name], MD

## 2016-10-14 ENCOUNTER — Inpatient Hospital Stay (HOSPITAL_COMMUNITY)
Admission: AD | Admit: 2016-10-14 | Discharge: 2016-10-16 | DRG: 775 | Disposition: A | Discharge status: Home / Self Care | Payer: Medicaid Other | Source: Ambulatory Visit | Attending: Obstetrics and Gynecology | Admitting: Obstetrics and Gynecology

## 2016-10-14 ENCOUNTER — Inpatient Hospital Stay (HOSPITAL_COMMUNITY): Payer: Medicaid Other | Admitting: Anesthesiology

## 2016-10-14 ENCOUNTER — Encounter (HOSPITAL_COMMUNITY): Payer: Self-pay

## 2016-10-14 DIAGNOSIS — O4290 Premature rupture of membranes, unspecified as to length of time between rupture and onset of labor, unspecified weeks of gestation: Secondary | ICD-10-CM | POA: Diagnosis present

## 2016-10-14 DIAGNOSIS — E669 Obesity, unspecified: Secondary | ICD-10-CM | POA: Diagnosis present

## 2016-10-14 DIAGNOSIS — O4292 Full-term premature rupture of membranes, unspecified as to length of time between rupture and onset of labor: Principal | ICD-10-CM | POA: Diagnosis present

## 2016-10-14 DIAGNOSIS — O99824 Streptococcus B carrier state complicating childbirth: Secondary | ICD-10-CM | POA: Diagnosis present

## 2016-10-14 DIAGNOSIS — Z3A37 37 weeks gestation of pregnancy: Secondary | ICD-10-CM

## 2016-10-14 DIAGNOSIS — Z87891 Personal history of nicotine dependence: Secondary | ICD-10-CM | POA: Diagnosis not present

## 2016-10-14 DIAGNOSIS — O99214 Obesity complicating childbirth: Secondary | ICD-10-CM | POA: Diagnosis present

## 2016-10-14 DIAGNOSIS — D649 Anemia, unspecified: Secondary | ICD-10-CM | POA: Diagnosis present

## 2016-10-14 DIAGNOSIS — O4202 Full-term premature rupture of membranes, onset of labor within 24 hours of rupture: Secondary | ICD-10-CM

## 2016-10-14 DIAGNOSIS — O9952 Diseases of the respiratory system complicating childbirth: Secondary | ICD-10-CM | POA: Diagnosis present

## 2016-10-14 DIAGNOSIS — Z6831 Body mass index (BMI) 31.0-31.9, adult: Secondary | ICD-10-CM

## 2016-10-14 DIAGNOSIS — J4520 Mild intermittent asthma, uncomplicated: Secondary | ICD-10-CM | POA: Diagnosis present

## 2016-10-14 DIAGNOSIS — O9902 Anemia complicating childbirth: Secondary | ICD-10-CM | POA: Diagnosis present

## 2016-10-14 LAB — RAPID URINE DRUG SCREEN, HOSP PERFORMED
AMPHETAMINES: NOT DETECTED
Barbiturates: NOT DETECTED
Benzodiazepines: NOT DETECTED
COCAINE: NOT DETECTED
OPIATES: NOT DETECTED
TETRAHYDROCANNABINOL: POSITIVE — AB

## 2016-10-14 LAB — CBC
HCT: 32.5 % — ABNORMAL LOW (ref 36.0–46.0)
Hemoglobin: 11.1 g/dL — ABNORMAL LOW (ref 12.0–15.0)
MCH: 30.2 pg (ref 26.0–34.0)
MCHC: 34.2 g/dL (ref 30.0–36.0)
MCV: 88.3 fL (ref 78.0–100.0)
Platelets: 197 10*3/uL (ref 150–400)
RBC: 3.68 MIL/uL — AB (ref 3.87–5.11)
RDW: 13.4 % (ref 11.5–15.5)
WBC: 7.1 10*3/uL (ref 4.0–10.5)

## 2016-10-14 LAB — TYPE AND SCREEN
ABO/RH(D): O POS
Antibody Screen: NEGATIVE

## 2016-10-14 LAB — SYPHILIS: RPR W/REFLEX TO RPR TITER AND TREPONEMAL ANTIBODIES, TRADITIONAL SCREENING AND DIAGNOSIS ALGORITHM: RPR Ser Ql: NONREACTIVE

## 2016-10-14 LAB — POCT FERN TEST: POCT Fern Test: POSITIVE

## 2016-10-14 MED ORDER — BENZOCAINE-MENTHOL 20-0.5 % EX AERO
1.0000 "application " | INHALATION_SPRAY | CUTANEOUS | Status: DC | PRN
  Administered 2016-10-15: 1 via TOPICAL
  Filled 2016-10-14 (×2): qty 56

## 2016-10-14 MED ORDER — LACTATED RINGERS IV SOLN: 500.0000 mL | INTRAVENOUS | Status: DC | PRN

## 2016-10-14 MED ORDER — PHENYLEPHRINE 40 MCG/ML (10ML) SYRINGE FOR IV PUSH (FOR BLOOD PRESSURE SUPPORT)
80.0000 ug | PREFILLED_SYRINGE | INTRAVENOUS | Status: DC | PRN
  Filled 2016-10-14: qty 5

## 2016-10-14 MED ORDER — LIDOCAINE HCL (PF) 1 % IJ SOLN
INTRAMUSCULAR | Status: DC | PRN
  Administered 2016-10-14: 13 mL via EPIDURAL

## 2016-10-14 MED ORDER — DIBUCAINE 1 % RE OINT: 1.0000 "application " | TOPICAL_OINTMENT | RECTAL | Status: DC | PRN

## 2016-10-14 MED ORDER — LACTATED RINGERS IV SOLN
500.0000 mL | Freq: Once | INTRAVENOUS | Status: AC
  Administered 2016-10-14: 500 mL via INTRAVENOUS

## 2016-10-14 MED ORDER — ONDANSETRON HCL 4 MG/2ML IJ SOLN
4.0000 mg | Freq: Four times a day (QID) | INTRAMUSCULAR | Status: DC | PRN
  Administered 2016-10-14 (×2): 4 mg via INTRAVENOUS
  Filled 2016-10-14 (×2): qty 2

## 2016-10-14 MED ORDER — OXYCODONE-ACETAMINOPHEN 5-325 MG PO TABS
1.0000 | ORAL_TABLET | ORAL | Status: DC | PRN
  Administered 2016-10-14 – 2016-10-16 (×4): 1 via ORAL
  Filled 2016-10-14 (×3): qty 1

## 2016-10-14 MED ORDER — WITCH HAZEL-GLYCERIN EX PADS: 1.0000 "application " | MEDICATED_PAD | CUTANEOUS | Status: DC | PRN

## 2016-10-14 MED ORDER — ONDANSETRON HCL 4 MG PO TABS: 4.0000 mg | ORAL_TABLET | ORAL | Status: DC | PRN

## 2016-10-14 MED ORDER — DIPHENHYDRAMINE HCL 50 MG/ML IJ SOLN: 12.5000 mg | INTRAMUSCULAR | Status: DC | PRN

## 2016-10-14 MED ORDER — PENICILLIN G POTASSIUM 5000000 UNITS IJ SOLR
5.0000 10*6.[IU] | Freq: Once | INTRAMUSCULAR | Status: AC
  Administered 2016-10-14: 5 10*6.[IU] via INTRAVENOUS
  Filled 2016-10-14: qty 5

## 2016-10-14 MED ORDER — FLEET ENEMA 7-19 GM/118ML RE ENEM: 1.0000 | ENEMA | RECTAL | Status: DC | PRN

## 2016-10-14 MED ORDER — ACETAMINOPHEN 325 MG PO TABS
650.0000 mg | ORAL_TABLET | ORAL | Status: DC | PRN
  Administered 2016-10-15: 650 mg via ORAL
  Filled 2016-10-14 (×2): qty 2

## 2016-10-14 MED ORDER — SODIUM CHLORIDE 0.9 % IV SOLN
2.0000 g | Freq: Four times a day (QID) | INTRAVENOUS | Status: DC
  Administered 2016-10-14: 2 g via INTRAVENOUS
  Filled 2016-10-14 (×4): qty 2000

## 2016-10-14 MED ORDER — ZOLPIDEM TARTRATE 5 MG PO TABS: 5.0000 mg | ORAL_TABLET | Freq: Every evening | ORAL | Status: DC | PRN

## 2016-10-14 MED ORDER — FENTANYL 2.5 MCG/ML BUPIVACAINE 1/10 % EPIDURAL INFUSION (WH - ANES)
14.0000 mL/h | INTRAMUSCULAR | Status: DC | PRN
  Administered 2016-10-14: 14 mL/h via EPIDURAL
  Filled 2016-10-14: qty 100

## 2016-10-14 MED ORDER — ONDANSETRON HCL 4 MG/2ML IJ SOLN: 4.0000 mg | INTRAMUSCULAR | Status: DC | PRN

## 2016-10-14 MED ORDER — OXYTOCIN 40 UNITS IN LACTATED RINGERS INFUSION - SIMPLE MED: 2.5000 [IU]/h | INTRAVENOUS | Status: DC

## 2016-10-14 MED ORDER — COCONUT OIL OIL: 1.0000 "application " | TOPICAL_OIL | Status: DC | PRN

## 2016-10-14 MED ORDER — LACTATED RINGERS IV SOLN: INTRAVENOUS | Status: DC

## 2016-10-14 MED ORDER — IBUPROFEN 600 MG PO TABS
600.0000 mg | ORAL_TABLET | Freq: Four times a day (QID) | ORAL | Status: DC
  Administered 2016-10-14 – 2016-10-16 (×9): 600 mg via ORAL
  Filled 2016-10-14 (×9): qty 1

## 2016-10-14 MED ORDER — OXYCODONE-ACETAMINOPHEN 5-325 MG PO TABS
2.0000 | ORAL_TABLET | ORAL | Status: DC | PRN
Start: 2016-10-14 — End: 2016-10-14

## 2016-10-14 MED ORDER — ALBUTEROL SULFATE (2.5 MG/3ML) 0.083% IN NEBU: 3.0000 mL | INHALATION_SOLUTION | RESPIRATORY_TRACT | Status: DC | PRN

## 2016-10-14 MED ORDER — OXYTOCIN BOLUS FROM INFUSION
500.0000 mL | Freq: Once | INTRAVENOUS | Status: AC
  Administered 2016-10-14: 500 mL via INTRAVENOUS

## 2016-10-14 MED ORDER — FENTANYL CITRATE (PF) 100 MCG/2ML IJ SOLN: 100.0000 ug | INTRAMUSCULAR | Status: DC | PRN

## 2016-10-14 MED ORDER — MISOPROSTOL 200 MCG PO TABS
ORAL_TABLET | ORAL | Status: AC
  Administered 2016-10-14: 800 ug
  Filled 2016-10-14: qty 4

## 2016-10-14 MED ORDER — EPHEDRINE 5 MG/ML INJ
10.0000 mg | INTRAVENOUS | Status: DC | PRN
  Filled 2016-10-14: qty 2

## 2016-10-14 MED ORDER — PHENYLEPHRINE 40 MCG/ML (10ML) SYRINGE FOR IV PUSH (FOR BLOOD PRESSURE SUPPORT)
80.0000 ug | PREFILLED_SYRINGE | INTRAVENOUS | Status: DC | PRN
  Filled 2016-10-14: qty 10
  Filled 2016-10-14: qty 5

## 2016-10-14 MED ORDER — TETANUS-DIPHTH-ACELL PERTUSSIS 5-2.5-18.5 LF-MCG/0.5 IM SUSP: 0.5000 mL | Freq: Once | INTRAMUSCULAR | Status: DC

## 2016-10-14 MED ORDER — DIPHENHYDRAMINE HCL 25 MG PO CAPS: 25.0000 mg | ORAL_CAPSULE | Freq: Four times a day (QID) | ORAL | Status: DC | PRN

## 2016-10-14 MED ORDER — SIMETHICONE 80 MG PO CHEW: 80.0000 mg | CHEWABLE_TABLET | ORAL | Status: DC | PRN

## 2016-10-14 MED ORDER — LIDOCAINE HCL (PF) 1 % IJ SOLN
30.0000 mL | INTRAMUSCULAR | Status: AC | PRN
  Administered 2016-10-14: 30 mL via SUBCUTANEOUS
  Filled 2016-10-14: qty 30

## 2016-10-14 MED ORDER — PRENATAL MULTIVITAMIN CH
1.0000 | ORAL_TABLET | Freq: Every day | ORAL | Status: DC
  Administered 2016-10-15 – 2016-10-16 (×2): 1 via ORAL
  Filled 2016-10-14 (×2): qty 1

## 2016-10-14 MED ORDER — SOD CITRATE-CITRIC ACID 500-334 MG/5ML PO SOLN: 30.0000 mL | ORAL | Status: DC | PRN

## 2016-10-14 MED ORDER — TERBUTALINE SULFATE 1 MG/ML IJ SOLN
0.2500 mg | Freq: Once | INTRAMUSCULAR | Status: DC | PRN
  Filled 2016-10-14: qty 1

## 2016-10-14 MED ORDER — ACETAMINOPHEN 325 MG PO TABS: 650.0000 mg | ORAL_TABLET | ORAL | Status: DC | PRN

## 2016-10-14 MED ORDER — SENNOSIDES-DOCUSATE SODIUM 8.6-50 MG PO TABS
2.0000 | ORAL_TABLET | ORAL | Status: DC
  Administered 2016-10-14 – 2016-10-15 (×2): 2 via ORAL
  Filled 2016-10-14 (×2): qty 2

## 2016-10-14 MED ORDER — OXYTOCIN 40 UNITS IN LACTATED RINGERS INFUSION - SIMPLE MED
1.0000 m[IU]/min | INTRAVENOUS | Status: DC
  Administered 2016-10-14: 2 m[IU]/min via INTRAVENOUS
  Filled 2016-10-14: qty 1000

## 2016-10-14 MED ORDER — DEXTROSE 5 % IV SOLN
2.5000 10*6.[IU] | INTRAVENOUS | Status: DC
  Administered 2016-10-14: 2.5 10*6.[IU] via INTRAVENOUS
  Filled 2016-10-14 (×2): qty 2.5

## 2016-10-14 MED ORDER — OXYCODONE-ACETAMINOPHEN 5-325 MG PO TABS
1.0000 | ORAL_TABLET | ORAL | Status: DC | PRN
Start: 2016-10-14 — End: 2016-10-14
  Administered 2016-10-14: 1 via ORAL
  Filled 2016-10-14 (×2): qty 1

## 2016-10-14 NOTE — Anesthesia Postprocedure Evaluation (Signed)
Anesthesia Post Note  Patient: Sydnee Cabalyshai Bacha  Procedure(s) Performed: * No procedures listed *     Patient location during evaluation: Mother Baby Anesthesia Type: Epidural Level of consciousness: awake and alert and oriented Pain management: satisfactory to patient Vital Signs Assessment: post-procedure vital signs reviewed and stable Respiratory status: spontaneous breathing and nonlabored ventilation Cardiovascular status: stable Postop Assessment: no headache, no backache, no signs of nausea or vomiting, adequate PO intake and patient able to bend at knees (patient up walking) Anesthetic complications: no    Last Vitals:  Vitals:   10/14/16 1429 10/14/16 1700  BP: 114/63 105/78  Pulse: (!) 53 85  Resp: 20 18  Temp:  36.8 C    Last Pain:  Vitals:   10/14/16 1730  TempSrc:   PainSc: 8    Pain Goal: Patients Stated Pain Goal: 2 (10/14/16 1730)               Madison HickmanGREGORY,Zenora Karpel

## 2016-10-14 NOTE — Progress Notes (Signed)
I took over care for this patient at 1630. Patient was very frustrated , teary eyed and overwhelmed with information and all the people that have been in her room with different things. MD and AD went into the room to talk to patient and get her somewhat calmed down and get a good game plan for her. I medicated patient and explained things to her and she was very happy with me and my care.

## 2016-10-14 NOTE — Lactation Note (Signed)
This note was copied from a baby's chart. Lactation Consultation Note  Patient Name: Ana Sydnee Cabalyshai Montecalvo UJWJX'BToday's Date: 10/14/2016 Reason for consult: Initial assessment   Initial consult at <1 hr after birth.  [facility].  Mom told LC she was "fine with breastfeeding and I'm going to give formula." Mom stated she does not have WIC but needed to get, so LC gave Lactation brochure and pointed out [facility] telephone [facility]. GA 37.5; Mom is P5; GBS+, Hx questionable Rx drug use.      Maternal Data Formula Feeding for Exclusion: Yes Reason for exclusion: Mother's choice to formula and breast feed on admission (mom told LC she was going to formula feed)  Lactation Tools Discussed/Used WIC Program: No   Consult Status Consult Status: Complete    Ana [name], Ana [name] 10/14/2016, 12:43 PM

## 2016-10-14 NOTE — Anesthesia Preprocedure Evaluation (Signed)
Anesthesia Evaluation  Patient identified by MRN, date of birth, ID band Patient awake    Reviewed: Allergy & Precautions, H&P , Patient's Chart, lab work & pertinent test results  Airway Mallampati: III  TM Distance: >3 FB Neck ROM: full    Dental no notable dental hx. (+) Teeth Intact   Pulmonary asthma , former smoker,    Pulmonary exam normal breath sounds clear to auscultation       Cardiovascular negative cardio ROS   Rhythm:regular Rate:Normal     Neuro/Psych negative neurological ROS  negative psych ROS   GI/Hepatic negative GI ROS, Neg liver ROS,   Endo/Other  Obesity  Renal/GU Renal diseaseKidney infections  negative genitourinary   Musculoskeletal   Abdominal   Peds  Hematology  (+) anemia ,   Anesthesia Other Findings   Reproductive/Obstetrics (+) Pregnancy                             Anesthesia Physical  Anesthesia Plan  ASA: II  Anesthesia Plan: Epidural   Post-op Pain Management:    Induction:   PONV Risk Score and Plan:   Airway Management Planned:   Additional Equipment:   Intra-op Plan:   Post-operative Plan:   Informed Consent: I have reviewed the patients History and Physical, chart, labs and discussed the procedure including the risks, benefits and alternatives for the proposed anesthesia with the patient or authorized representative who has indicated his/her understanding and acceptance.     Plan Discussed with: Anesthesiologist  Anesthesia Plan Comments:         Anesthesia Quick Evaluation

## 2016-10-14 NOTE — Anesthesia Procedure Notes (Signed)
Epidural Patient location during procedure: OB Start time: 10/14/2016 8:01 AM End time: 10/14/2016 8:16 AM  Staffing Anesthesiologist: Anitra LauthMILLER, Samauri Kellenberger RAY Performed: anesthesiologist   Preanesthetic Checklist Completed: patient identified, site marked, surgical consent, pre-op evaluation, timeout performed, IV checked, risks and benefits discussed and monitors and equipment checked  Epidural Patient position: sitting Prep: DuraPrep Patient monitoring: heart rate, cardiac monitor, continuous pulse ox and blood pressure Approach: midline Location: L2-L3 Injection technique: LOR saline  Needle:  Needle type: Tuohy  Needle gauge: 17 G Needle length: 9 cm Needle insertion depth: 6 cm Catheter type: closed end flexible Catheter size: 20 Guage Catheter at skin depth: 9 cm Test dose: negative  Assessment Events: blood not aspirated, injection not painful, no injection resistance, negative IV test and no paresthesia  Additional Notes Reason for block:procedure for pain

## 2016-10-14 NOTE — Anesthesia Pain Management Evaluation Note (Signed)
  CRNA Pain Management Visit Note  Patient: Ana [name], 26 y.o., female  "Hello I am a [facility] We have an anesthesia team available at all times to provide care throughout the hospital, including epidural management and anesthesia for C-section. I don't know your plan for the delivery whether it a natural birth, water birth, IV sedation, nitrous supplementation, doula or epidural, but we want to meet your pain goals."   1.Was your pain managed to your expectations on prior hospitalizations?   No prior hospitalizations  2.What is your expectation for pain management during this hospitalization?     Epidural  3.How can we help you reach that goal? Maintain epidural.  Record the patient's initial score and the patient's pain goal.   Pain: 0  Pain Goal: 7 [facility] wants you to be able to say your pain was always managed very well.  Daton Szilagyi 10/14/2016

## 2016-10-14 NOTE — MAU Note (Signed)
Pt states water broke at 2:00am-clear fluid. Pt denies contractions or vaginal bleeding. Reports a decrease in fetal movement since water broke. GBS+. Cervix has not been checked.

## 2016-10-14 NOTE — Progress Notes (Addendum)
Labor Progress Note  Ana [name] is a 26 y.o. [id number] at 5638w5d  admitted for PROM  S: Complaining of pain with PCN infusions in her shoulder. Pain is severe. Has epidural and ctx are bearable. No other concerns.   O:  BP 123/68   Pulse (!) 56   Temp 98.1 F (36.7 C) (Oral)   Resp 18   Ht 5\' 4"  (1.626 m)   Wt 180 lb (81.6 kg)   LMP 12/29/2015 (Approximate)   SpO2 100%   BMI 30.90 kg/m   No intake/output data recorded.  FHT:  FHR: 120 bpm, variability: moderate,  accelerations:  Present,  decelerations:  Present early UC:   regular, every 2-4 minutes SVE:   Dilation: 4 Effacement (%): 50 Station: Ballotable Exam by:: Dorathy DaftShay Payne RN   Pitocin @ 8 mu/min  Labs: Lab Results  Component Value Date   WBC 7.1 10/14/2016   HGB 11.1 (L) 10/14/2016   HCT 32.5 (L) 10/14/2016   MCV 88.3 10/14/2016   PLT 197 10/14/2016    Assessment / Plan: 26 y.o. [id number] 6738w5d  in early labor. PROM around 0200. Augmentation of labor, progressing well  Labor: Progressing on Pitocin Fetal Wellbeing:  Category II  GBS+: Changed PCN to ampicillin due to pain with PCN infusion Pain Control:  Epidural Anticipated MOD:  NSVD  Expectant management   Caryl AdaJazma Phelps, DO OB [facility] -  10/14/2016, 9:16 AM

## 2016-10-14 NOTE — H&P (Signed)
LABOR AND DELIVERY ADMISSION HISTORY AND PHYSICAL NOTE  Ana [name] is a 26 y.o. female [phone] with IUP at [redacted]w[redacted]d by 13-wk U/S presenting for ROM. She reports ROM with clear fluids at 2 am this morning. Not having regular contractions.  She reports positive fetal movement.  Prenatal History/Complications: Clinic  [facility] Prenatal Labs  Dating  EDD [redacted]w[redacted]d by official dating utlrasound Blood type:   O positive  Genetic Screen 1 Screen:    AFP:     Quad:  negative   NIPS: Antibody: neg  Anatomic [place]  normal Rubella:  imm  GTT     Third trimester: nml 72/75/74 RPR: Non Reactive (03/27 0127)   Flu vaccine  declined HBsAg:   neg  TDaP vaccine   Pt declined                                          Rhogam: HIV: Non Reactive (03/27 0127)   Baby Food    Maybe breast?                                         GBS: (For PCN allergy, check sensitivities)  Contraception    Depo Pap: 2016 - normal  Circumcision  yes   Pediatrician  unsure   Support Person   Ana [name]     Past Medical History: Past Medical History:  Diagnosis Date  . Abnormal Pap smear   . Asthma   . Chlamydia Jan 2014  . Kidney infection   . Vaginal Pap smear, abnormal     Past Surgical History: Past Surgical History:  Procedure Laterality Date  . WISDOM TOOTH EXTRACTION      Obstetrical History: OB History    Gravida Para Term Preterm AB Living   8 5 5  0 2 5   SAB TAB Ectopic Multiple Live Births   2 0 0 0 5      Social History: Social History   Social History  . Marital status: Single    Spouse name: N/A  . Number of children: N/A  . Years of education: N/A   Social History Main Topics  . Smoking status: Former Smoker    Quit date: 04/25/2014  . Smokeless tobacco: Never Used  . Alcohol use No     Comment: occasional/not since pregnancy  . Drug use: No     Comment: stopped 04/09/12 after she found out she was pregnant   LAST   SMOKED    2 WEEKS  AGO  . Sexual activity: No   Other Topics Concern  . None    Social History Narrative  . None    Family History: Family History  Problem Relation Age of Onset  . Hypertension Mother   . Hypertension Maternal Grandmother     Allergies: No Known Allergies  Prescriptions Prior to Admission  Medication Sig Dispense Refill Last Dose  . ondansetron (ZOFRAN ODT) 4 MG disintegrating tablet Take 1 tablet (4 mg total) by mouth every 6 (six) hours as needed for nausea. 20 tablet 0   . ondansetron (ZOFRAN) 8 MG tablet Take 1 tablet (8 mg total) by mouth every 8 (eight) hours as needed for nausea or vomiting. 10 tablet 0 Taking  . Prenatal Vit-Fe Fumarate-FA (PRENATAL VITAMINS) 28-0.8 MG TABS TAKE  1 TABLET BY MOUTH DAILY 30 tablet 5 Taking  . PROVENTIL HFA 108 (90 Base) MCG/ACT inhaler INHALE 2 PUFFS INTO THE LUNGS EVERY 6 HOURS AS NEEDED FOR WHEEZING OR SHORTNESS OF BREATH 6.7 g 0 Taking    Review of Systems  All systems reviewed and negative except as stated in HPI  Physical Exam Blood pressure 136/74, pulse 91, temperature 98.2 F (36.8 C), temperature source Oral, resp. rate 20, last menstrual period 12/29/2015, SpO2 100 %, unknown if currently breastfeeding. General appearance: alert and cooperative, NAD Lungs: normal WOB, no respiratory distresss Heart: regular rate Abdomen: soft, non-tender, gravid Extremities: No calf swelling or tenderness Presentation: cephalic Fetal monitoring: baseline rate 130, moderate variability, +acel, no decel Uterine activity: irritibality Dilation: 3 Effacement (%): 40 Exam by:: Dr. Nira [name]   Prenatal labs: ABO, Rh: O/POS/-- (02/13 1342) Antibody: NEG (02/13 1342) Rubella: !Error! RPR: Non Reactive (05/29 0911)  HBsAg: NEGATIVE (02/13 1342)  HIV: NONREACTIVE (02/13 1342)  GBS:   positive 1 hr Glucola: normal Genetic screening:  n/a Anatomy US: normal  Prenatal Transfer Tool  Maternal Diabetes: No Genetic Screening: n/a Maternal Ultrasounds/Referrals: Normal Fetal Ultrasounds or other  Referrals:  None Maternal Substance Abuse:  THC noted in EHR; alos per EHR review, [facility] database shows multiple rx for alprazolam and oxycodone  Significant Maternal Medications:  As above; mother denies any meds other than PNV and zofran Significant Maternal Lab Results: None  Results for orders placed or performed during the hospital encounter of 10/14/16 (from the past 24 hour(s))  POCT fern test   Collection Time: 10/14/16  2:57 AM  Result Value Ref Range   POCT Fern Test Positive = ruptured amniotic membanes   CBC   Collection Time: 10/14/16  3:40 AM  Result Value Ref Range   WBC 7.1 4.0 - 10.5 K/uL   RBC 3.68 (L) 3.87 - 5.11 MIL/uL   Hemoglobin 11.1 (L) 12.0 - 15.0 g/dL   HCT 16.132.5 (L) 09.636.0 - 04.546.0 %   MCV 88.3 78.0 - 100.0 fL   MCH 30.2 26.0 - 34.0 pg   MCHC 34.2 30.0 - 36.0 g/dL   RDW 40.913.4 81.111.5 - 91.415.5 %   Platelets 197 150 - 400 K/uL      Assessment: Ana [name] is a 26 y.o. [id number] at 3138w5d here for PROM.  #Labor: Not in labor. Will start Pitocin, cervix favorable #Pain: May have epidural per patient's request #FWB: Cat I #ID:  GBS+, start PCN #MOF: Breast and bottle #MOC: Depo #Circ:  Outpatient  #UDS ordered given h/o THC noted in EHR  WardenJulie P [name] 10/14/2016, 4:12 AM

## 2016-10-15 MED ORDER — IBUPROFEN 600 MG PO TABS: 600.0000 mg | ORAL_TABLET | Freq: Four times a day (QID) | ORAL | 0 refills | Status: DC

## 2016-10-15 NOTE — Progress Notes (Signed)
CSW attempted to complete an assessment with [name] in room 106.  When CSW arrived, [name] was asleep with infant on her chest.  CSW informed [name] about the risk of co-sleeping and provided [name] with SIDS education. [name] appeared irritated and communicated "This is my sixth child and I know what I'm doing".  CSW informed [name] if hospital staff observe [name] co-sleeping with infant again, a CPS report will be made.  CSW explained CSW's role and encouraged [name] to ask questions during the assessment. [name] was apprehensive, rude, and not receptive to meeting with CSW.  [name] verified [name]'s address and telephone number and declined to provided CSW with any additional information.  CSW informed CSW that infant's UDS was positive for THC and CSW needed to gather information to assist [name] with resources and to make a report to [place] CPS.  [name] asked CSW to leave [name]'s room.  CSW left room and updated [name]'s bedside nurse.  A CPS report was made with [place] CPS Intake Worker, Aisha Young. There are no barriers to d/c and CPS will follow-up with [name] and family within 72 hours.   Faige Seely Boyd-Gilyard, MSW, LCSW Clinical Social Work [phone] 

## 2016-10-15 NOTE — Progress Notes (Signed)
Post Partum Day 1 Subjective: up ad lib, voiding, tolerating PO and Patient complaining of some mild backpain relieved with the percocet. She was able to rest well last night. She was eager to go home yesterday, but today is less eager. She asked that I put in her discharge, but said I may need to cancel it for her because she may stay if baby is not doing well.   Objective: Blood pressure 129/69, pulse (!) 56, temperature 98.2 F (36.8 C), temperature source Oral, resp. rate 16, height 5\' 4"  (1.626 m), weight 81.6 kg (180 lb), last menstrual period 12/29/2015, SpO2 99 %, unknown if currently breastfeeding.  Physical Exam:  General: alert, cooperative and no distress Lochia: appropriate Uterine Fundus: firm Incision: n/a DVT Evaluation: No evidence of DVT seen on physical exam. No cords or calf tenderness. No significant calf/ankle edema.   Recent Labs  10/14/16 0340  HGB 11.1*  HCT 32.5*    Assessment/Plan: Plan for discharge tomorrow- maybe today if baby is doing well   LOS: 1 day   Ana [name] 10/15/2016, 7:39 AM   CNM attestation Post Partum Day #1 I have seen and examined this patient and agree with above documentation in the resident's note.   Ana [name] is a 26 y.o. [id number] s/p SVD.  Pt denies problems with ambulating, voiding or po intake. Pain is well controlled.  Plan for birth control is Depo-Provera.  Method of Feeding: bottle  PE:  BP 129/69 (BP Location: Left Arm)   Pulse (!) 56   Temp 98.2 F (36.8 C) (Oral)   Resp 16   Ht 5\' 4"  (1.626 m)   Wt 81.6 kg (180 lb)   LMP 12/29/2015 (Approximate)   SpO2 99%   Breastfeeding? Unknown   BMI 30.90 kg/m  Fundus firm  Plan for discharge: possibly today, if not then 10/16/16  Cam HaiSHAW, KIMBERLY, CNM 8:53 AM 10/15/2016

## 2016-10-15 NOTE — Progress Notes (Signed)
CSW was paged to meet with CPS worker, [name], and bedside nurse on [facility] [place]).  CPS informed CSW that [name] has refused to comply with CPS and hospital security had to be notified due to safety concerns.  While CSW was consulting with CSW, [name], [name] requested to meet with CSW.  When CSW arrived to [name] rooms, [name] appeared frustrated and sad as evidence by [name] crying and continuing not to be compliant with CPS. Bedside nurse, NT, and CPS were all present when CSW arrived. CSW attempted to process [name] thoughts and feelings as it relates to CPS investigation.  [name] was emotional and continued to decline to provide CPS with any additional information.  CPS left the room and informed CSW that CPS will follow-up  with CSW on tomorrow.  However, at this time, there are barriers to d/c to [name].   Ana [name], MSW, LCSW Clinical Social Work [phone] 

## 2016-10-15 NOTE — Discharge Summary (Signed)
OB Discharge Summary     Patient Name: Ana [name] DOB: [birth year] MRN: [id number]  Date of admission: 10/14/2016 Delivering MD: Caryl Ada Y   Date of discharge: 10/15/2016  Admitting diagnosis: 37 WEEKS ROM  Intrauterine pregnancy: [redacted]w[redacted]d     Secondary diagnosis:  Active Problems:   PROM (premature rupture of membranes)   SVD (spontaneous vaginal delivery)  Additional problems:  Patient Active Problem List   Diagnosis Date Noted  . PROM (premature rupture of membranes) 10/14/2016  . SVD (spontaneous vaginal delivery) 10/14/2016  . GBS (group B Streptococcus carrier), +RV culture, currently pregnant 10/12/2016  . Drug abuse, marijuana 10/05/2016  . History of preterm delivery, currently pregnant 05/21/2016  . Supervision of high risk pregnancy, antepartum, second trimester 04/21/2016  . Asthma, mild intermittent 05/22/2014      Discharge diagnosis: Term Pregnancy Delivered                                                                                                Post partum procedures:none  Augmentation: Pitocin  Complications: None  Hospital course:  Onset of Labor With Vaginal Delivery     26 y.o. yo [id number] at [redacted]w[redacted]d was admitted in Latent Labor on 10/14/2016. Patient had an uncomplicated labor course as follows:  Membrane Rupture Time/Date: 2:00 AM ,10/14/2016   Intrapartum Procedures: Episiotomy: None [1]                                         Lacerations:  Labial [10]  Patient had a delivery of a Viable infant. 10/14/2016  Information for the patient's newborn:  Ubah, Radke [[id number]]  Delivery Method: Vag-Spont    Pateint had an uncomplicated postpartum course. She is ambulating, tolerating a regular diet, passing flatus, and urinating well. Patient is discharged home in stable condition on 10/15/16.   Physical exam  Vitals:   10/14/16 1330 10/14/16 1429 10/14/16 1700 10/15/16 0520  BP: 126/75 114/63 105/78 129/69  Pulse: (!) 58 (!) 53 85 (!) 56   Resp: 18 20 18 16   Temp:   98.2 F (36.8 C) 98.2 F (36.8 C)  TempSrc:    Oral  SpO2:   99%   Weight:      Height:       General: alert, cooperative and no distress Lochia: appropriate Uterine Fundus: firm Incision: N/A DVT Evaluation: No evidence of DVT seen on physical exam. No cords or calf tenderness. No significant calf/ankle edema. Labs: Lab Results  Component Value Date   WBC 7.1 10/14/2016   HGB 11.1 (L) 10/14/2016   HCT 32.5 (L) 10/14/2016   MCV 88.3 10/14/2016   PLT 197 10/14/2016   CMP Latest Ref Rng & Units 04/20/2016  Glucose 65 - 99 mg/dL 81  BUN 6 - 20 mg/dL 9  Creatinine 1.30 - 8.65 mg/dL 7.84  Sodium 696 - 295 mmol/L 133(L)  Potassium 3.5 - 5.1 mmol/L 3.9  Chloride 101 - 111 mmol/L 103  CO2 22 - 32 mmol/L 23  Calcium 8.9 -  10.3 mg/dL 9.0  Total Protein 6.5 - 8.1 g/dL 6.6  Total Bilirubin 0.3 - 1.2 mg/dL 0.6  Alkaline Phos 38 - 126 U/L 40  AST 15 - 41 U/L 16  ALT 14 - 54 U/L 10(L)    Discharge instruction: per After Visit Summary and "Baby and Me Booklet".  After visit meds:  Allergies as of 10/15/2016   No Known Allergies     Medication List    STOP taking these medications   ondansetron 4 MG disintegrating tablet Commonly known as:  ZOFRAN ODT   ondansetron 8 MG tablet Commonly known as:  ZOFRAN     TAKE these medications   acetaminophen 500 MG tablet Commonly known as:  TYLENOL Take 500 mg by mouth every 6 (six) hours as needed for mild pain.   calcium carbonate 500 MG chewable tablet Commonly known as:  TUMS - dosed in mg elemental calcium Chew 1 tablet by mouth 2 (two) times daily as needed for indigestion or heartburn.   ibuprofen 600 MG tablet Commonly known as:  ADVIL,MOTRIN Take 1 tablet (600 mg total) by mouth every 6 (six) hours.   Prenatal Vitamins 28-0.8 MG Tabs TAKE 1 TABLET BY MOUTH DAILY   PROVENTIL HFA 108 (90 Base) MCG/ACT inhaler Generic drug:  albuterol INHALE 2 PUFFS INTO THE LUNGS EVERY 6 HOURS AS NEEDED  FOR WHEEZING OR SHORTNESS OF BREATH       Diet: routine diet  Activity: Advance as tolerated. Pelvic rest for 6 weeks.   Outpatient follow up:4-6 weeks Follow up Appt:No future appointments. Follow up Visit:No Follow-up on file.  Postpartum contraception: Depo Provera  Newborn Data: Live born female  Birth Weight: 6 lb 10.2 oz (3010 g) APGAR: 9, 9  Baby Feeding: Breast Disposition:home with mother   10/15/2016 [place] Shirley, DO  OB FELLOW DISCHARGE ATTESTATION  I have seen and examined this patient and agree with above documentation in the resident's note.   Frederik PearJulie P Joylynn Defrancesco, MD OB Fellow 5:58 PM

## 2016-10-15 NOTE — Discharge Instructions (Signed)

## 2016-10-15 NOTE — Progress Notes (Signed)
CSW spoke with CPS worker, [name], via telephone.  CPS informed CSW that CPS was in route to complete a safety plan with [name].  CSW provided CPS with [name]'s room number and requested a follow-up phone call after CPS meets with [name].  CSW will update bedside nurse of infant's safety disposition plan after CSW speaks with CPS.  Ana [name], MSW, LCSW Clinical Social Work [phone]  
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
## 2016-10-16 NOTE — Progress Notes (Signed)
Per CPS worker, Ana [name], there are no barriers to infant's d/c to Ana [name].  CPS will follow-up with family after d/c.    CSW met with Ana [name] in room 106.  When CSW arrived, Ana [name] was attaching and bonding with infant as evident by Ana [name] engaging in skin to skin.  Ana [name] was polite and very apologetic to CSW about her behaviors on yesterday (10/15/16).  CSW informed Ana [name] of CPS plan and Ana [name] expressed gratitude and happiness. Ana [name] understands that CPS will continue to provide service to family after d/c.  Ana [name], MSW, LCSW Clinical Social Work [phone] 

## 2016-10-16 NOTE — Discharge Summary (Signed)
OB Discharge Summary                           Patient Name: Ana [name] DOB: Apr 25, 1990 MRN: [id number]  Date of admission: 10/14/2016 Delivering MD: Pincus Large   Date of discharge: 10/16/2016  Admitting diagnosis: 37 WEEKS ROM  Intrauterine pregnancy: [redacted]w[redacted]d     Secondary diagnosis:  Active Problems:   PROM (premature rupture of membranes)   SVD (spontaneous vaginal delivery)  Additional problems:      Patient Active Problem List   Diagnosis Date Noted  . PROM (premature rupture of membranes) 10/14/2016  . SVD (spontaneous vaginal delivery) 10/14/2016  . GBS (group B Streptococcus carrier), +RV culture, currently pregnant 10/12/2016  . Drug abuse, marijuana 10/05/2016  . History of preterm delivery, currently pregnant 05/21/2016  . Supervision of high risk pregnancy, antepartum, second trimester 04/21/2016  . Asthma, mild intermittent 05/22/2014                                       Discharge diagnosis: Term Pregnancy Delivered                                                                                                Post partum procedures:none  Augmentation: Pitocin  Complications: None  Hospital course:  Onset of Labor With Vaginal Delivery     26 y.o. yo [id number] at [redacted]w[redacted]d was admitted in Latent Labor on 10/14/2016. Patient had an uncomplicated labor course as follows:  Membrane Rupture Time/Date: 2:00 AM ,10/14/2016   Intrapartum Procedures: Episiotomy: None [1]                                         Lacerations:  Labial [10]  Patient had a delivery of a Viable infant. 10/14/2016  Information for the patient's newborn:  Tacha, Manni [[id number]]  Delivery Method: Vag-Spont    Pateint had an uncomplicated postpartum course. She is ambulating, tolerating a regular diet, passing flatus, and urinating well. Patient is discharged home in stable condition on 10/16/16.  However, there is some concern regarding the safety of the baby (see social worker's  note) d/t + drug screen (THC) and mom's behavior yesterday w/CPS> they plan to visit and assess again today.   Physical exam        Vitals:   10/14/16 1330 10/14/16 1429 10/14/16 1700 10/15/16 0520  BP: 126/75 114/63 105/78 129/69  Pulse: (!) 58 (!) 53 85 (!) 56  Resp: 18 20 18 16   Temp:   98.2 F (36.8 C) 98.2 F (36.8 C)  TempSrc:    Oral  SpO2:   99%   Weight:      Height:       General: alert, cooperative and no distress Lochia: appropriate Uterine Fundus: firm Incision: N/A DVT Evaluation: No evidence of DVT seen on physical exam. No cords or calf  tenderness. No significant calf/ankle edema. Labs:      Lab Results  Component Value Date   WBC 7.1 10/14/2016   HGB 11.1 (L) 10/14/2016   HCT 32.5 (L) 10/14/2016   MCV 88.3 10/14/2016   PLT 197 10/14/2016   CMP Latest Ref Rng & Units 04/20/2016  Glucose 65 - 99 mg/dL 81  BUN 6 - 20 mg/dL 9  Creatinine 4.090.44 - 8.111.00 mg/dL 9.140.64  Sodium 782135 - 956145 mmol/L 133(L)  Potassium 3.5 - 5.1 mmol/L 3.9  Chloride 101 - 111 mmol/L 103  CO2 22 - 32 mmol/L 23  Calcium 8.9 - 10.3 mg/dL 9.0  Total Protein 6.5 - 8.1 g/dL 6.6  Total Bilirubin 0.3 - 1.2 mg/dL 0.6  Alkaline Phos 38 - 126 U/L 40  AST 15 - 41 U/L 16  ALT 14 - 54 U/L 10(L)    Discharge instruction: per After Visit Summary and "Baby and Me Booklet".  After visit meds:  Allergies as of 10/15/2016   No Known Allergies             Medication List     STOP taking these medications   ondansetron 4 MG disintegrating tablet Commonly known as:  ZOFRAN ODT   ondansetron 8 MG tablet Commonly known as:  ZOFRAN     TAKE these medications   acetaminophen 500 MG tablet Commonly known as:  TYLENOL Take 500 mg by mouth every 6 (six) hours as needed for mild pain.   calcium carbonate 500 MG chewable tablet Commonly known as:  TUMS - dosed in mg elemental calcium Chew 1 tablet by mouth 2 (two) times daily as needed for indigestion or  heartburn.   ibuprofen 600 MG tablet Commonly known as:  ADVIL,MOTRIN Take 1 tablet (600 mg total) by mouth every 6 (six) hours.   Prenatal Vitamins 28-0.8 MG Tabs TAKE 1 TABLET BY MOUTH DAILY   PROVENTIL HFA 108 (90 Base) MCG/ACT inhaler Generic drug:  albuterol INHALE 2 PUFFS INTO THE LUNGS EVERY 6 HOURS AS NEEDED FOR WHEEZING OR SHORTNESS OF BREATH       Diet: routine diet  Activity: Advance as tolerated. Pelvic rest for 6 weeks.   Outpatient follow up:4-6 weeks Follow up Appt:No future appointments. Follow up Visit:No Follow-up on file.  Postpartum contraception: Depo Provera  Newborn Data: Live born female  Birth Weight: 6 lb 10.2 oz (3010 g) APGAR: 9, 9  Baby Feeding: Breast Disposition: awaiting CPS   10/15/2016 [place] Shirley, DO

## 2016-10-19 ENCOUNTER — Encounter: Payer: Self-pay | Admitting: Obstetrics & Gynecology

## 2016-10-19 ENCOUNTER — Ambulatory Visit: Payer: Self-pay

## 2016-10-26 ENCOUNTER — Encounter: Payer: Self-pay | Admitting: Family Medicine

## 2016-11-13 IMAGING — CT CT ABD-PELV W/ CM
2 of 4 series · 3 of 46 positions shown, 4 images · IV contrast (Iodine)
Comparison: None.

CLINICAL DATA: 24-year-old female with acute abdominal and pelvic
pain for 2 days.

EXAM:
CT ABDOMEN AND PELVIS WITH CONTRAST
TECHNIQUE: Multidetector CT imaging of the abdomen and pelvis was performed
using the standard protocol following bolus administration of
intravenous contrast.
CONTRAST:  100mL OMNIPAQUE IOHEXOL 300 MG/ML  SOLN

[Series 204: cor · coronal · 0.45mm/px · 2 of 126 slices shown, 3 images]
[im 42/126  soft-tissue]
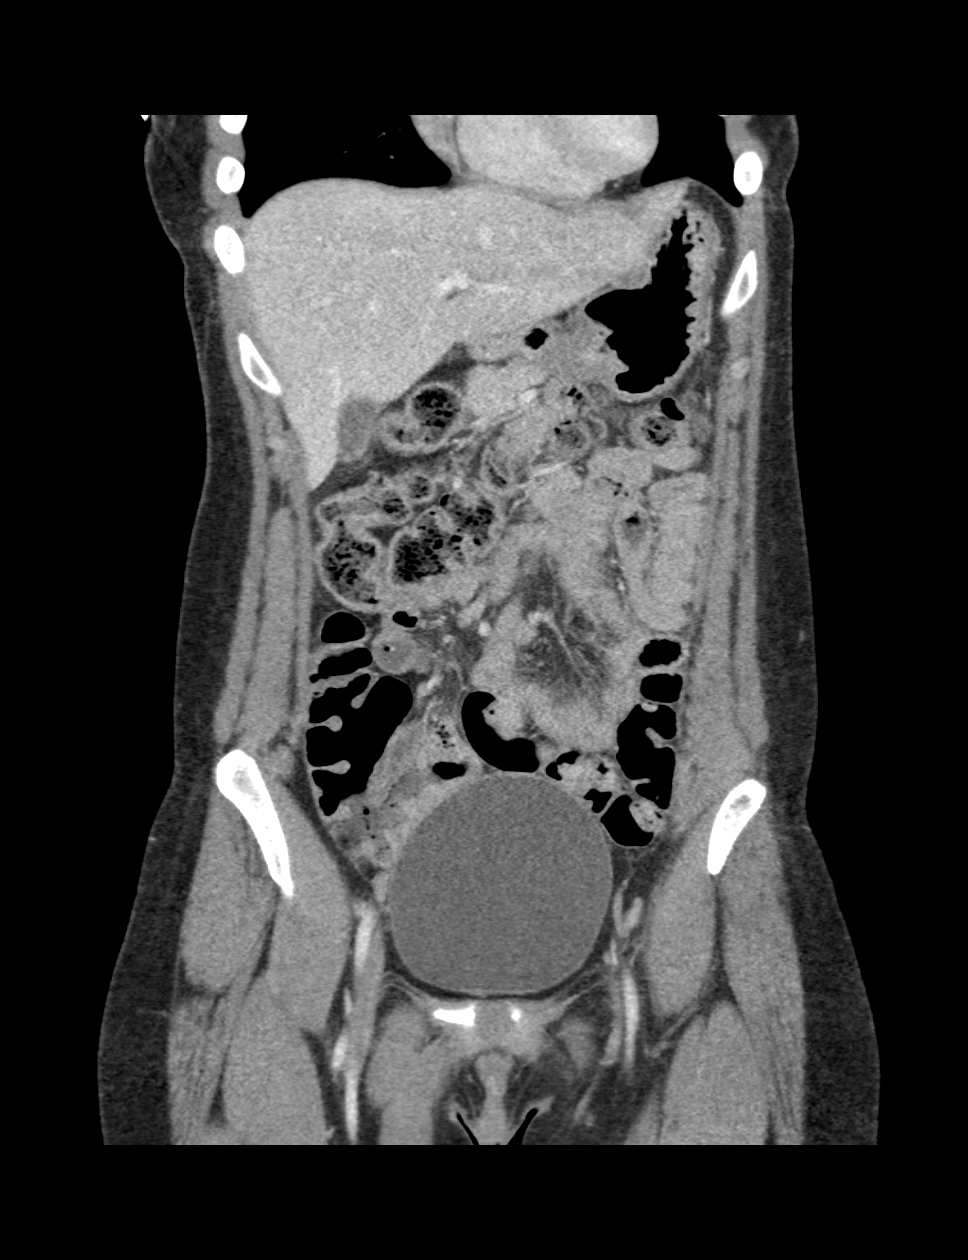
[im 42/126  bone]
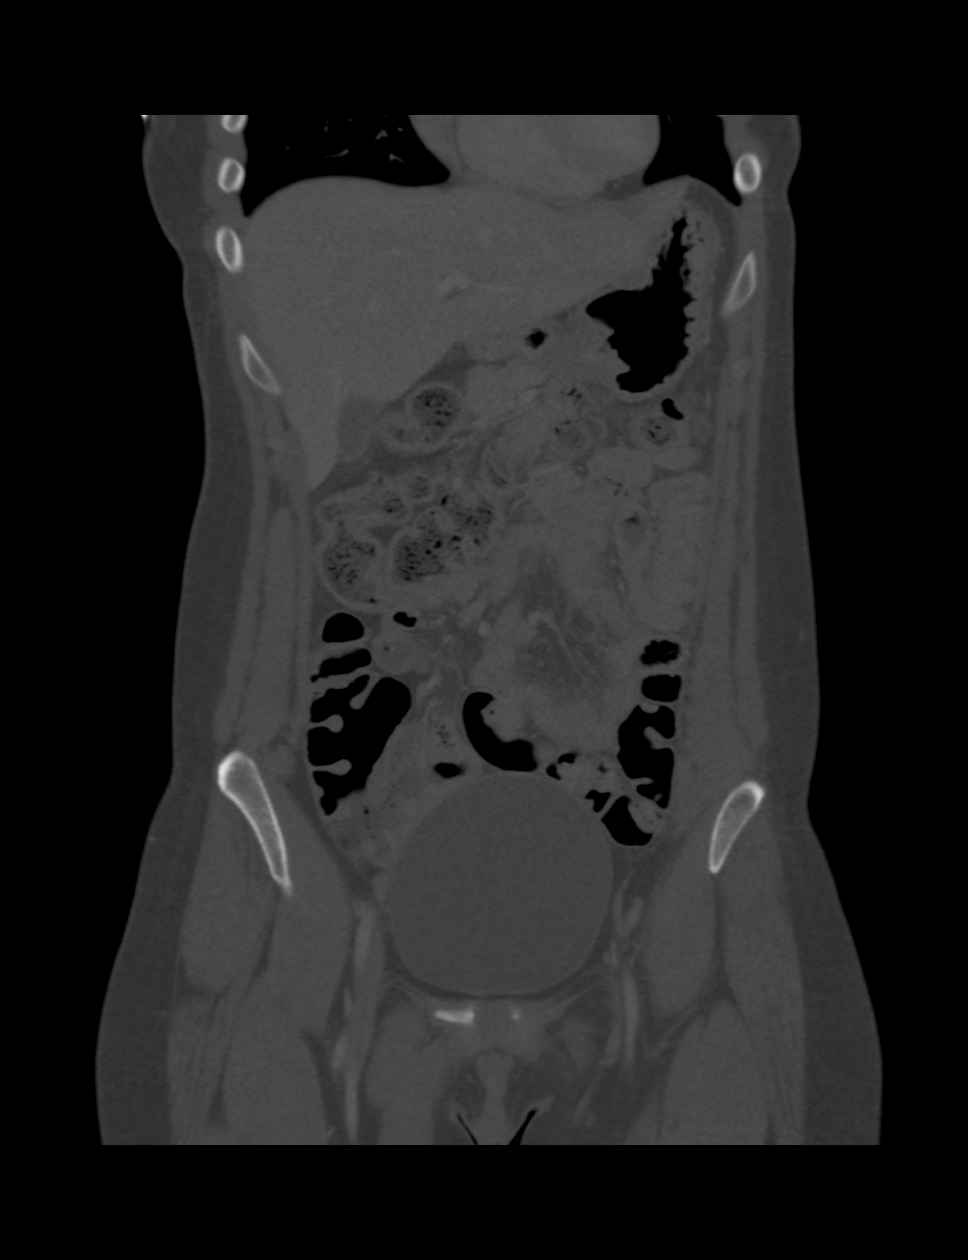
[im 98/126  soft-tissue]
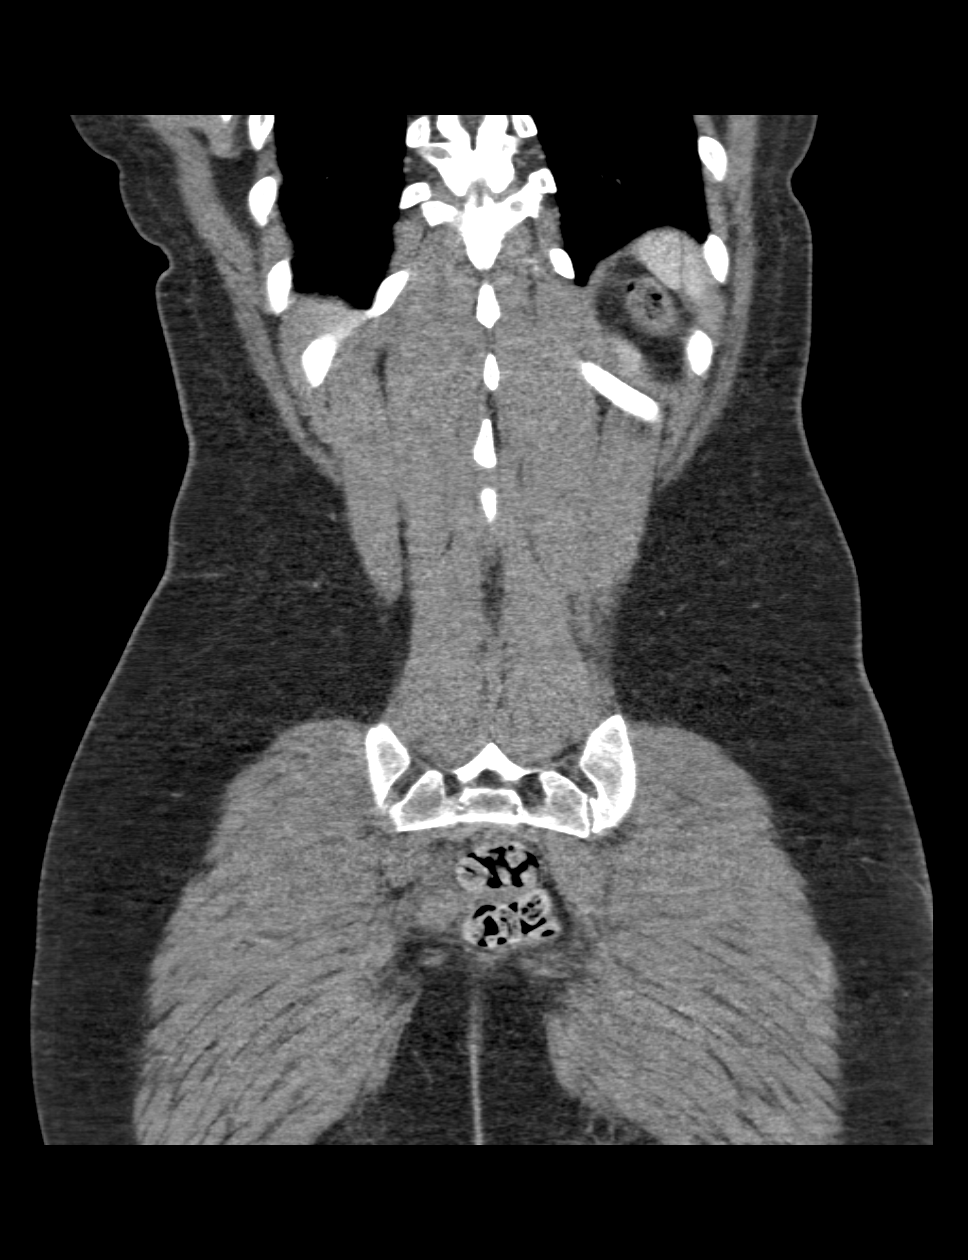

[Series 205: sag · sagittal · 0.45mm/px · 1 of 172 slices shown]
[im 58/172  soft-tissue]
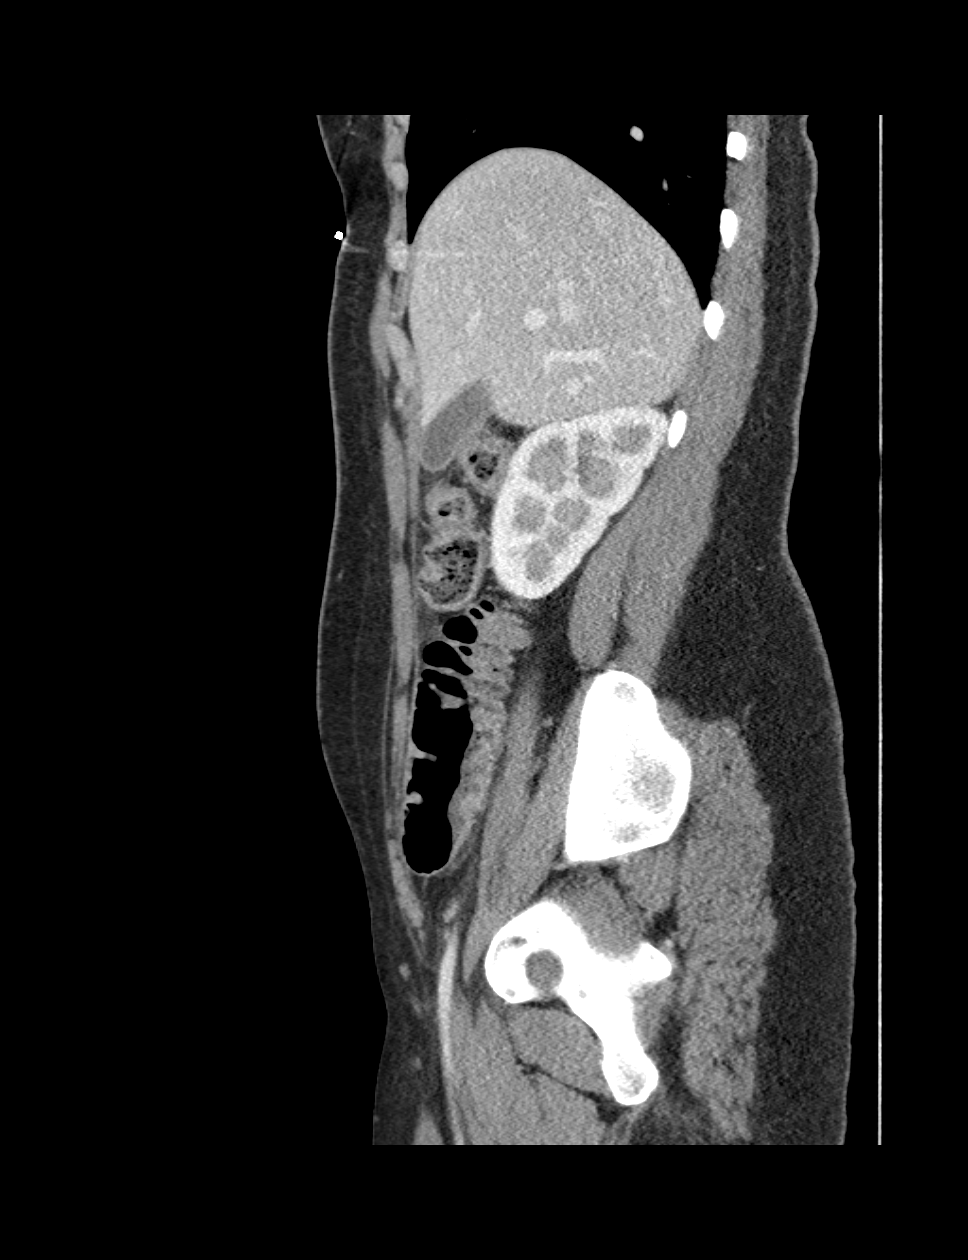

[3 of 46 positions shown; findings below may reference images not displayed]

FINDINGS: Lower chest:  Unremarkable

Hepatobiliary: The liver and gallbladder are unremarkable. There is
no evidence of biliary dilatation.

Pancreas: Unremarkable

Spleen: Unremarkable

Adrenals/Urinary Tract: The kidneys, adrenal glands and bladder are
unremarkable.

Stomach/Bowel: Unremarkable. There is no evidence of bowel
obstruction or focal bowel wall thickening. The appendix is not
identified but there is no evidence of enlarged tubular structure or
inflammation in the pericecal region.

Vascular/Lymphatic: Unremarkable. No enlarged lymph nodes or
abdominal aortic aneurysm.

Reproductive: No definite abnormalities.

Other: Trace amount of free pelvic fluid is likely physiologic.
There is no evidence of abscess or pneumoperitoneum.

Musculoskeletal: No acute or suspicious abnormalities.
IMPRESSION: No evidence of acute or significant abnormality.

## 2016-11-19 ENCOUNTER — Ambulatory Visit: Payer: Self-pay | Admitting: Advanced Practice Midwife

## 2016-11-19 NOTE — Telephone Encounter (Signed)
error 

## 2017-04-28 IMAGING — US US OB TRANSVAGINAL
1 series · 15 of 27 positions shown · non-contrast
Comparison: None.

CLINICAL DATA: 24-year-old female with pelvic pain.

EXAM:
OBSTETRIC <14 WK US AND TRANSVAGINAL OB US
TECHNIQUE: Both transabdominal and transvaginal ultrasound examinations were
performed for complete evaluation of the gestation as well as the
maternal uterus, adnexal regions, and pelvic cul-de-sac.
Transvaginal technique was performed to assess early pregnancy.

[Series 1: us ob transvaginal · 15 of 27 slices shown]
[im 1/27]
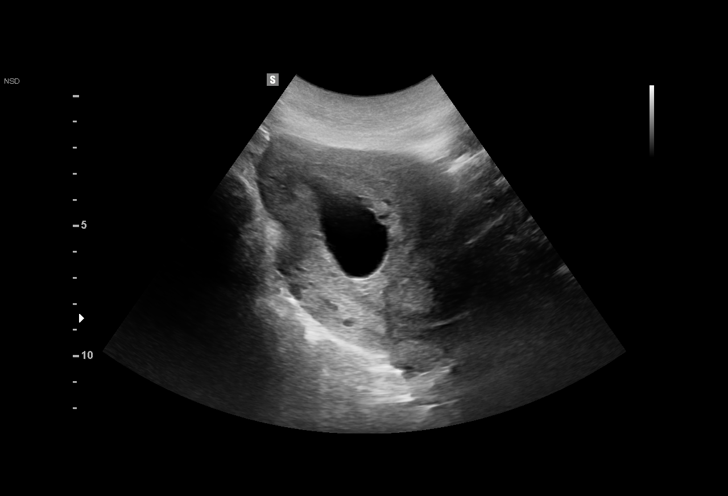
[im 3/27]
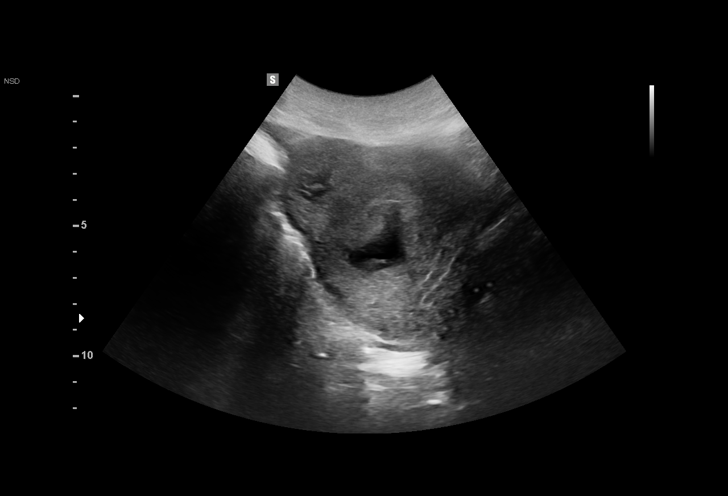
[im 5/27]
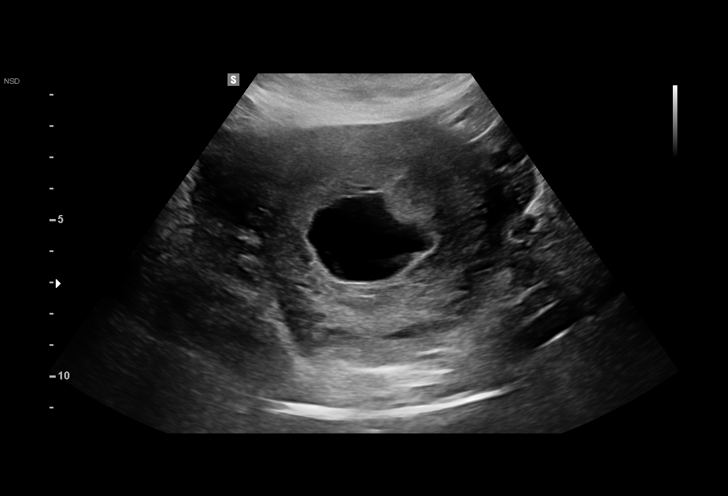
[im 7/27]
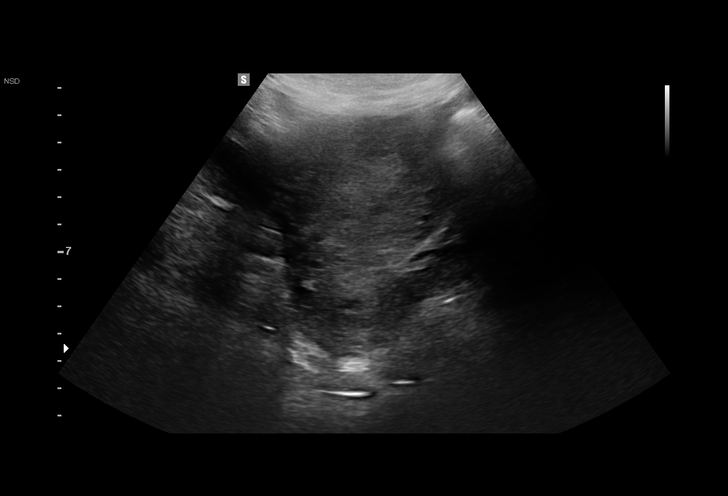
[im 9/27]
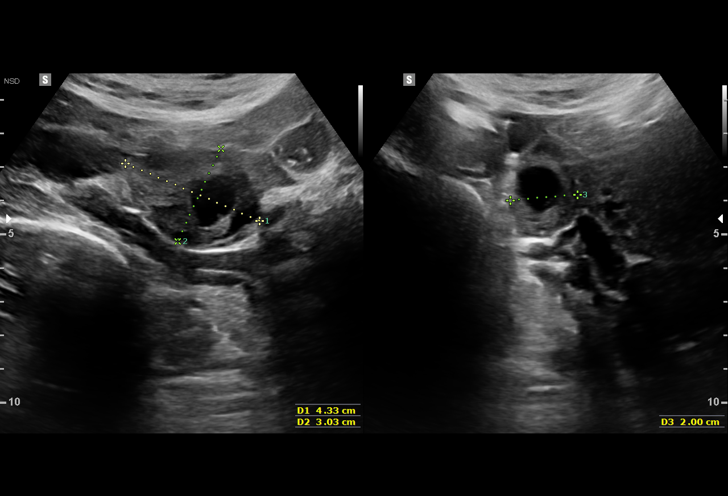
[im 10/27]
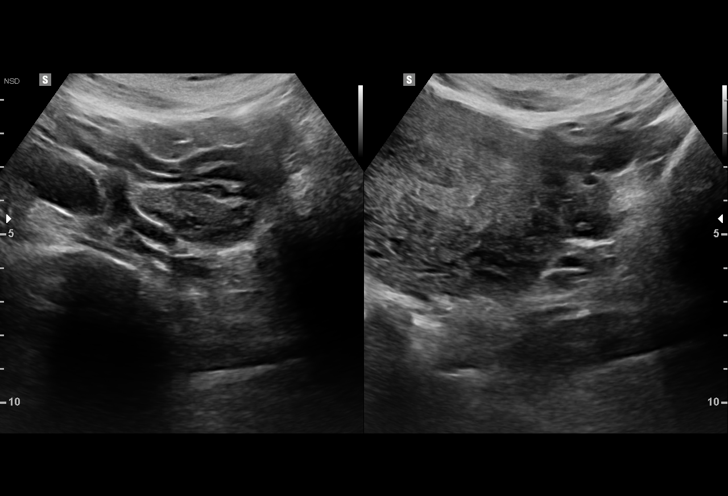
[im 12/27]
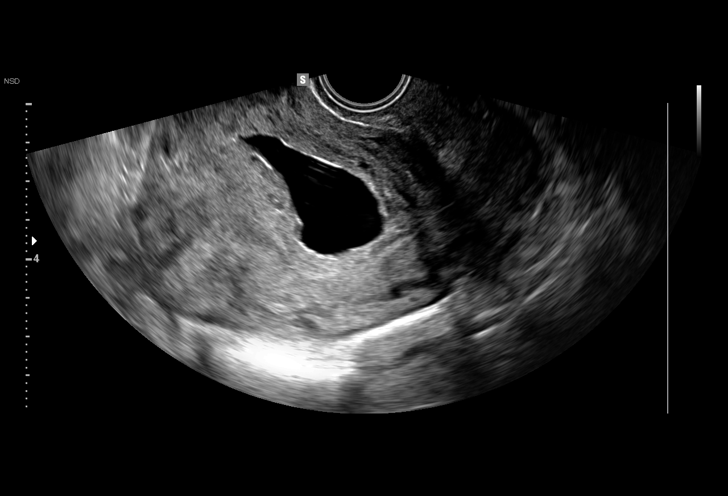
[im 14/27]
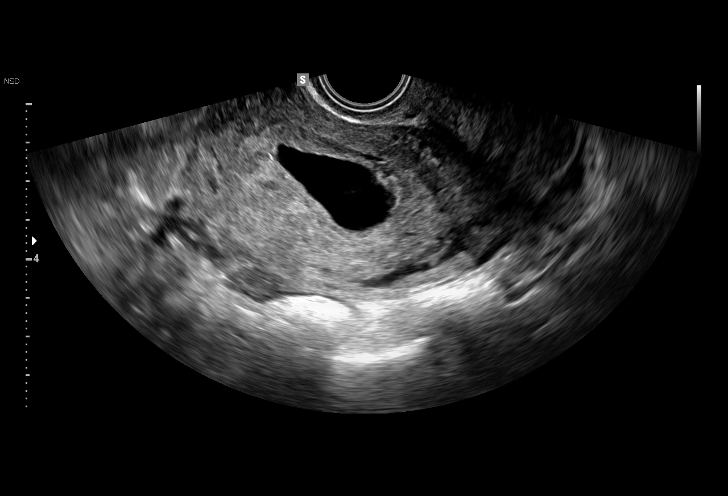
[im 16/27]
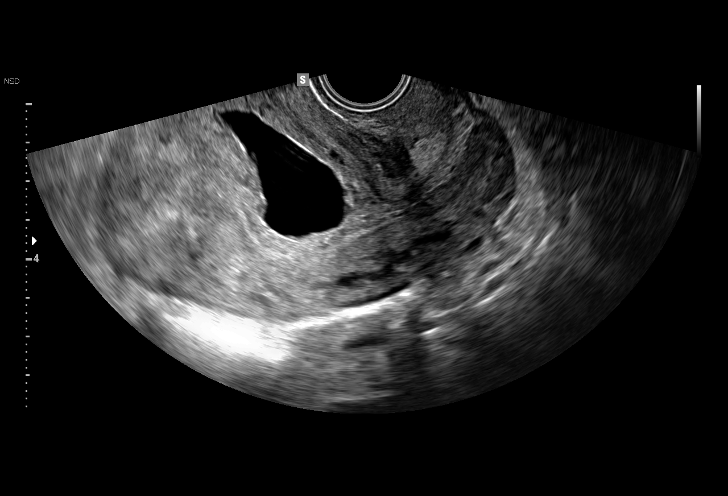
[im 18/27]
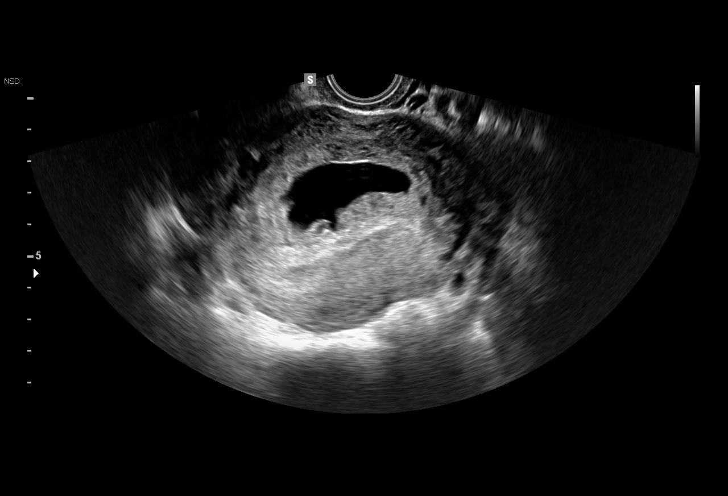
[im 19/27]
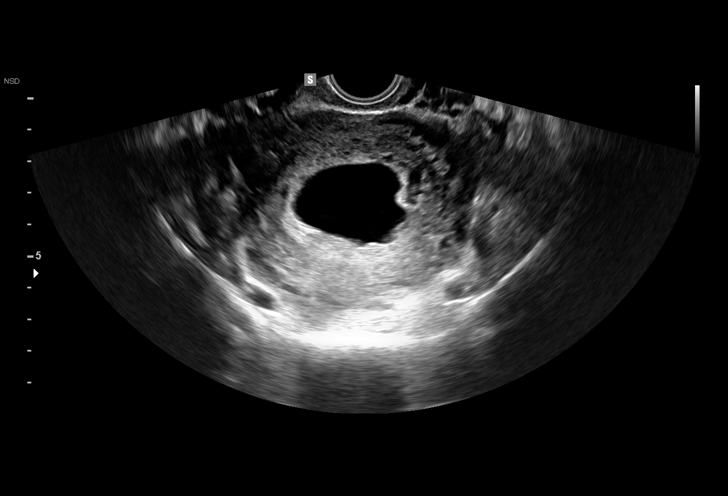
[im 21/27]
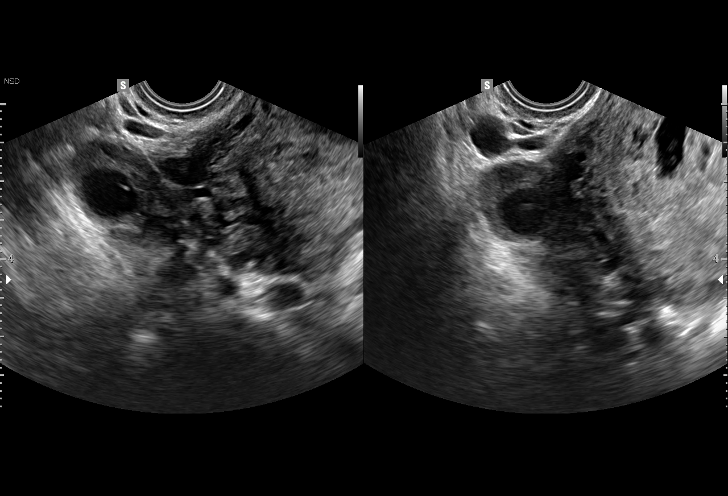
[im 23/27]
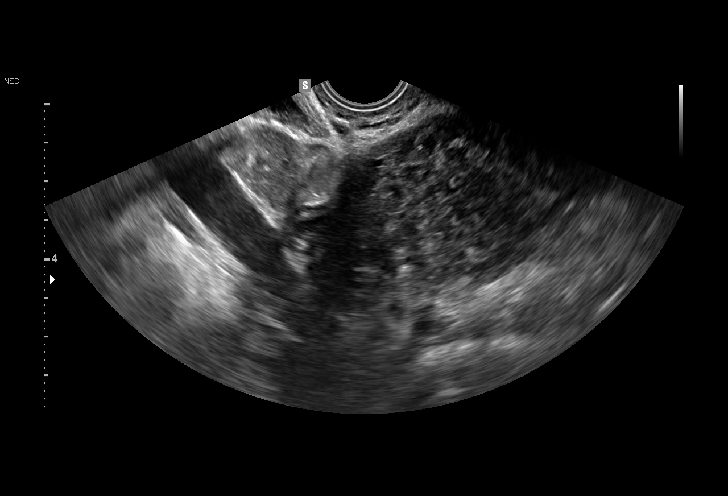
[im 25/27]
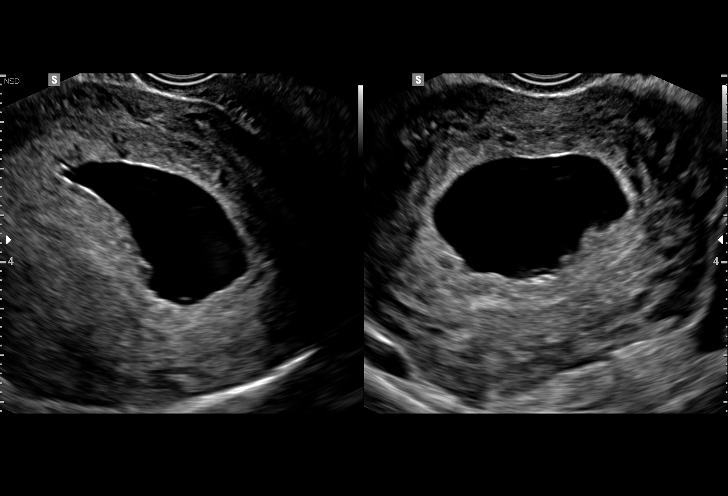
[im 27/27]
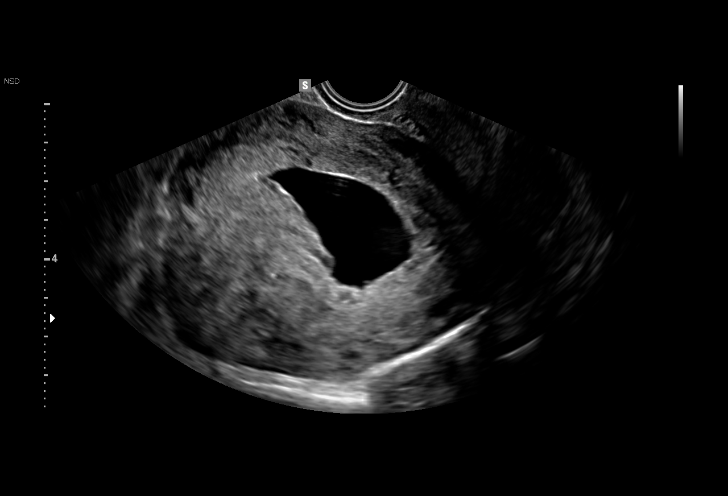

[15 of 27 positions shown; findings below may reference images not displayed]

FINDINGS: There is a somewhat irregular cystic structure within the
endometrial canal. No fetal pole or yolk sac identified. This cystic
structure likely represent a blighted ovum. A pseudo gestation of an
ectopic pregnancy is not excluded. Correlation with clinical exam
and follow-up with serial HCG levels and ultrasound recommended. If
this cystic structure is a gestational sac the estimated gestational
age based on mean sac diameter of 3.8 cm is 9 weeks, 1 day.

The ovaries appear unremarkable. The right ovary measures 3.2 x
x 1.9 cm and the left ovary measures 3.5 x 1.7 x 1.6 cm. No free
fluid identified within the pelvis.
IMPRESSION: Somewhat irregular cystic structure within the endometrium as
described likely representing a blighted ovum. A pseudo gestation of
an ectopic pregnancy is not excluded. Correlation with clinical exam
and follow-up with serial HCG levels and ultrasound recommended.

## 2017-07-13 ENCOUNTER — Encounter: Payer: Self-pay | Admitting: *Deleted

## 2017-08-07 ENCOUNTER — Other Ambulatory Visit: Payer: Self-pay

## 2017-08-07 ENCOUNTER — Ambulatory Visit (HOSPITAL_COMMUNITY)
Admission: EM | Admit: 2017-08-07 | Discharge: 2017-08-07 | Disposition: A | Discharge status: Home / Self Care | Payer: Self-pay | Attending: Internal Medicine | Admitting: Internal Medicine

## 2017-08-07 ENCOUNTER — Ambulatory Visit (INDEPENDENT_AMBULATORY_CARE_PROVIDER_SITE_OTHER): Payer: Self-pay

## 2017-08-07 ENCOUNTER — Encounter (HOSPITAL_COMMUNITY): Payer: Self-pay | Admitting: Emergency Medicine

## 2017-08-07 DIAGNOSIS — M79602 Pain in left arm: Secondary | ICD-10-CM

## 2017-08-07 MED ORDER — KETOROLAC TROMETHAMINE 60 MG/2ML IM SOLN
60.0000 mg | Freq: Once | INTRAMUSCULAR | Status: AC
  Administered 2017-08-07: 60 mg via INTRAMUSCULAR

## 2017-08-07 MED ORDER — IBUPROFEN 800 MG PO TABS: 800.0000 mg | ORAL_TABLET | Freq: Three times a day (TID) | ORAL | 0 refills | Status: DC

## 2017-08-07 MED ORDER — KETOROLAC TROMETHAMINE 60 MG/2ML IM SOLN
INTRAMUSCULAR | Status: AC
  Filled 2017-08-07: qty 2
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
## 2017-08-07 NOTE — Discharge Instructions (Signed)
We gave you a shot of toradol today Use anti-inflammatories for pain/swelling. You may take up to 800 mg Ibuprofen every 8 hours with food. You may supplement Ibuprofen with Tylenol [phone] mg every 8 hours.   Ice multiple times a day to help with swelling  Use sling for comfort, but do not use 24/7, work on moving elbow and arm so that it does not get stiff  Return if symptoms worsening, or swelling worsening
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
## 2018-01-26 ENCOUNTER — Ambulatory Visit (INDEPENDENT_AMBULATORY_CARE_PROVIDER_SITE_OTHER): Payer: Self-pay | Admitting: *Deleted

## 2018-01-26 DIAGNOSIS — Z202 Contact with and (suspected) exposure to infections with a predominantly sexual mode of transmission: Secondary | ICD-10-CM

## 2018-01-26 DIAGNOSIS — B9689 Other specified bacterial agents as the cause of diseases classified elsewhere: Secondary | ICD-10-CM

## 2018-01-26 DIAGNOSIS — Z113 Encounter for screening for infections with a predominantly sexual mode of transmission: Secondary | ICD-10-CM

## 2018-01-26 DIAGNOSIS — N760 Acute vaginitis: Secondary | ICD-10-CM

## 2018-01-26 DIAGNOSIS — N898 Other specified noninflammatory disorders of vagina: Secondary | ICD-10-CM
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
## 2018-01-27 ENCOUNTER — Ambulatory Visit: Payer: Self-pay | Admitting: Obstetrics and Gynecology

## 2018-01-27 LAB — HIV ANTIBODY (ROUTINE TESTING W REFLEX): HIV SCREEN 4TH GENERATION: NONREACTIVE

## 2018-01-27 LAB — CERVICOVAGINAL ANCILLARY ONLY
Bacterial vaginitis: POSITIVE — AB
Candida vaginitis: NEGATIVE
Chlamydia: NEGATIVE
Neisseria Gonorrhea: NEGATIVE
TRICH (WINDOWPATH): POSITIVE — AB

## 2018-01-27 NOTE — Progress Notes (Signed)
I agree with the nurses note and plan of care.   Venia Carbonasch, Jennifer I, NP 01/27/2018 8:24 AM

## 2018-01-31 ENCOUNTER — Other Ambulatory Visit: Payer: Self-pay | Admitting: Obstetrics and Gynecology

## 2018-01-31 MED ORDER — METRONIDAZOLE 500 MG PO TABS: 500.0000 mg | ORAL_TABLET | Freq: Two times a day (BID) | ORAL | 0 refills | Status: DC

## 2018-01-31 NOTE — Progress Notes (Signed)
+   Trichomonas and BV on wet prep Discussed these results with the patient over the phone. Rx Flagyl sent to pharmacy, no alcohol use while taking.    Duane Lopeasch, Josimar Corning I, NP 01/31/2018 9:40 AM

## 2018-03-04 ENCOUNTER — Ambulatory Visit (HOSPITAL_COMMUNITY)
Admission: EM | Admit: 2018-03-04 | Discharge: 2018-03-04 | Disposition: A | Discharge status: Home / Self Care | Payer: Self-pay | Attending: Family Medicine | Admitting: Family Medicine

## 2018-03-04 ENCOUNTER — Encounter (HOSPITAL_COMMUNITY): Payer: Self-pay | Admitting: Emergency Medicine

## 2018-03-04 DIAGNOSIS — R69 Illness, unspecified: Secondary | ICD-10-CM | POA: Insufficient documentation

## 2018-03-04 DIAGNOSIS — R05 Cough: Secondary | ICD-10-CM

## 2018-03-04 DIAGNOSIS — R5383 Other fatigue: Secondary | ICD-10-CM

## 2018-03-04 DIAGNOSIS — R509 Fever, unspecified: Secondary | ICD-10-CM

## 2018-03-04 DIAGNOSIS — J111 Influenza due to unidentified influenza virus with other respiratory manifestations: Secondary | ICD-10-CM

## 2018-03-04 MED ORDER — OSELTAMIVIR PHOSPHATE 75 MG PO CAPS: 75.0000 mg | ORAL_CAPSULE | Freq: Two times a day (BID) | ORAL | 0 refills | Status: DC

## 2018-03-04 MED ORDER — KETOROLAC TROMETHAMINE 60 MG/2ML IM SOLN
INTRAMUSCULAR | Status: AC
  Filled 2018-03-04: qty 2

## 2018-03-04 MED ORDER — ACETAMINOPHEN 325 MG PO TABS
ORAL_TABLET | ORAL | Status: AC
  Filled 2018-03-04: qty 2

## 2018-03-04 MED ORDER — ONDANSETRON 4 MG PO TBDP: 4.0000 mg | ORAL_TABLET | Freq: Three times a day (TID) | ORAL | 0 refills | Status: DC | PRN

## 2018-03-04 MED ORDER — ACETAMINOPHEN 325 MG PO TABS
650.0000 mg | ORAL_TABLET | Freq: Once | ORAL | Status: AC
  Administered 2018-03-04: 650 mg via ORAL

## 2018-03-04 MED ORDER — KETOROLAC TROMETHAMINE 60 MG/2ML IM SOLN
60.0000 mg | Freq: Once | INTRAMUSCULAR | Status: AC
  Administered 2018-03-04: 60 mg via INTRAMUSCULAR

## 2018-03-04 NOTE — ED Provider Notes (Signed)
[facility]    CSN: [id number] Arrival date & time: 03/04/18  1938     History   Chief Complaint Chief Complaint  Patient presents with  . Fever    HPI Ana [name] is a 27 y.o. female.   HPI  Patient's had 2 days of fever, chills, body aches, fatigue, cold symptoms.  She states that she can hardly function.  She cannot eat or drink.  She is been laying on the couch.  She has 6 children.  No one is come to help her.  She called her mother to call her today so she could come to get medical care.  She is laying on the table crying because of her discomfort.  She states that 1 of her children was exposed to influenza.  One else at home is sick.  In general she is healthy, she does have a history of asthma but no cough or shortness of breath at this time.  Past Medical History:  Diagnosis Date  . Abnormal Pap smear   . Asthma   . Chlamydia Jan 2014  . Kidney infection   . Vaginal Pap smear, abnormal     Patient Active Problem List   Diagnosis Date Noted  . PROM (premature rupture of membranes) 10/14/2016  . SVD (spontaneous vaginal delivery) 10/14/2016  . GBS (group B Streptococcus carrier), +RV culture, currently pregnant 10/12/2016  . Drug abuse, marijuana 10/05/2016  . History of preterm delivery, currently pregnant 05/21/2016  . Supervision of high risk pregnancy, antepartum, second trimester 04/21/2016  . Asthma, mild intermittent 05/22/2014    Past Surgical History:  Procedure Laterality Date  . WISDOM TOOTH EXTRACTION      OB History    Gravida  8   Para  6   Term  6   Preterm  0   AB  2   Living  6     SAB  2   TAB  0   Ectopic  0   Multiple  0   Live Births  6            Home Medications    Prior to Admission medications   Medication Sig Start Date End Date Taking? Authorizing Provider  ondansetron (ZOFRAN ODT) 4 MG disintegrating tablet Take 1 tablet (4 mg total) by mouth every 8 (eight) hours as needed for nausea or  vomiting. 03/04/18   Eustace MooreNelson, Luci Bellucci Sue, MD  oseltamivir (TAMIFLU) 75 MG capsule Take 1 capsule (75 mg total) by mouth every 12 (twelve) hours. 03/04/18   Eustace MooreNelson, Anaisa Radi Sue, MD    Family History Family History  Problem Relation Age of Onset  . Hypertension Mother   . Hypertension Maternal Grandmother     Social History Social History   Tobacco Use  . Smoking status: Former Smoker    Last attempt to quit: 04/25/2014    Years since quitting: 3.8  . Smokeless tobacco: Never Used  Substance Use Topics  . Alcohol use: No    Comment: occasional/not since pregnancy  . Drug use: No    Comment: stopped 04/09/12 after she found out she was pregnant   LAST   SMOKED    2 WEEKS  AGO     Allergies   Patient has no known allergies.   Review of Systems Review of Systems  Constitutional: Positive for activity change, appetite change, chills, diaphoresis, fatigue and fever.  HENT: Positive for congestion and rhinorrhea. Negative for ear pain and sore throat.  Eyes: Negative for pain and visual disturbance.  Respiratory: Positive for cough. Negative for shortness of breath.   Cardiovascular: Negative for chest pain and palpitations.  Gastrointestinal: Negative for abdominal pain and vomiting.  Genitourinary: Negative for dysuria and hematuria.  Musculoskeletal: Negative for arthralgias and back pain.  Skin: Negative for color change and rash.  Neurological: Negative for seizures and syncope.  All other systems reviewed and are negative.    Physical Exam Triage Vital Signs ED Triage Vitals [03/04/18 2005]  Enc Vitals Group     BP 121/67     Pulse Rate 98     Resp 18     Temp (!) 101 F (38.3 C)     Temp src      SpO2 100 %     Weight      Height      Head Circumference      Peak Flow      Pain Score 9     Pain Loc      Pain Edu?      Excl. in GC?    No data found.  Updated Vital Signs BP 121/67   Pulse 98   Temp (!) 101 F (38.3 C)   Resp 18   LMP 02/27/2018    SpO2 100%        Physical Exam Constitutional:      General: She is in acute distress.     Appearance: She is well-developed. She is ill-appearing.  HENT:     Head: Normocephalic and atraumatic.     Right Ear: Tympanic membrane and ear canal normal.     Left Ear: Tympanic membrane and ear canal normal.     Nose: Congestion present.     Mouth/Throat:     Pharynx: Posterior oropharyngeal erythema present.  Eyes:     Conjunctiva/sclera: Conjunctivae normal.     Pupils: Pupils are equal, round, and reactive to light.  Neck:     Musculoskeletal: Normal range of motion.  Cardiovascular:     Rate and Rhythm: Regular rhythm. Tachycardia present.     Heart sounds: Normal heart sounds.  Pulmonary:     Effort: Pulmonary effort is normal. No respiratory distress.     Breath sounds: Normal breath sounds.  Abdominal:     General: Abdomen is flat. There is no distension.     Palpations: Abdomen is soft.  Musculoskeletal: Normal range of motion.  Lymphadenopathy:     Cervical: Cervical adenopathy present.  Skin:    General: Skin is warm and dry.  Neurological:     General: No focal deficit present.     Mental Status: She is alert. Mental status is at baseline.  Psychiatric:     Comments: Distress.  Tearful.      UC Treatments / Results  Labs (all labs ordered are listed, but only abnormal results are displayed) Labs Reviewed - No data to display  EKG None  Radiology No results found.  Procedures Procedures (including critical care time)  Medications Ordered in UC Medications  ketorolac (TORADOL) injection 60 mg (has no administration in time range)  acetaminophen (TYLENOL) tablet 650 mg (650 mg Oral Given 03/04/18 2008)    Initial Impression / Assessment and Plan / UC Course  I have reviewed the triage vital signs and the nursing notes.  Pertinent labs & imaging results that were available during my care of the patient were reviewed by me and considered in my medical  decision making (see chart for  details).     Told patient she likely has influenza.  We discussed Tamiflu.  Side effects.  She wants to try Tamiflu because she feels so bad.  She has 6 children to care for.  I gave her Zofran for nausea.  Expected improvement over next several days.  Note for work.  Return if worse Final Clinical Impressions(s) / UC Diagnoses   Final diagnoses:  Influenza-like illness     Discharge Instructions     Get plenty of rest Fluids Take Tylenol or ibuprofen for pain and fever Take nausea medicine if needed Take the Tamiflu twice a day for 5 days Expect provement over the next few days.  Return if worse instead of better   ED Prescriptions    Medication Sig Dispense Auth. Provider   ondansetron (ZOFRAN ODT) 4 MG disintegrating tablet Take 1 tablet (4 mg total) by mouth every 8 (eight) hours as needed for nausea or vomiting. 20 tablet Eustace Moore, MD   oseltamivir (TAMIFLU) 75 MG capsule Take 1 capsule (75 mg total) by mouth every 12 (twelve) hours. 10 capsule Eustace Moore, MD     Controlled Substance Prescriptions [place] Controlled Substance Registry consulted? Not Applicable   Eustace Moore, MD 03/04/18 2052

## 2018-03-04 NOTE — Discharge Instructions (Signed)
Get plenty of rest Fluids Take Tylenol or ibuprofen for pain and fever Take nausea medicine if needed Take the Tamiflu twice a day for 5 days Expect provement over the next few days.  Return if worse instead of better

## 2018-03-04 NOTE — ED Triage Notes (Signed)
Pt c/o body aches, fever, chills, x2 days.

## 2018-03-16 IMAGING — US US PELVIS LIMITED
1 series · 15 of 25 positions shown · non-contrast
Comparison: None.

CLINICAL DATA: Pregnant, pelvic pain, question ovarian torsion

EXAM:
LIMITED ULTRASOUND OF PELVIS
DOPPLER ULTRASOUND OF OVARIES
TECHNIQUE: Limited transabdominal ultrasound examination of the pelvis was
performed to evaluate the ovaries and adnexa regions only.
Sonographic assessment of the gravid uterus was not requested and
was not performed.
Color and duplex Doppler ultrasound was utilized to evaluate blood
flow to the ovaries.

[Series 1: us pelvis limited · 15 of 25 slices shown]
[im 1/25]
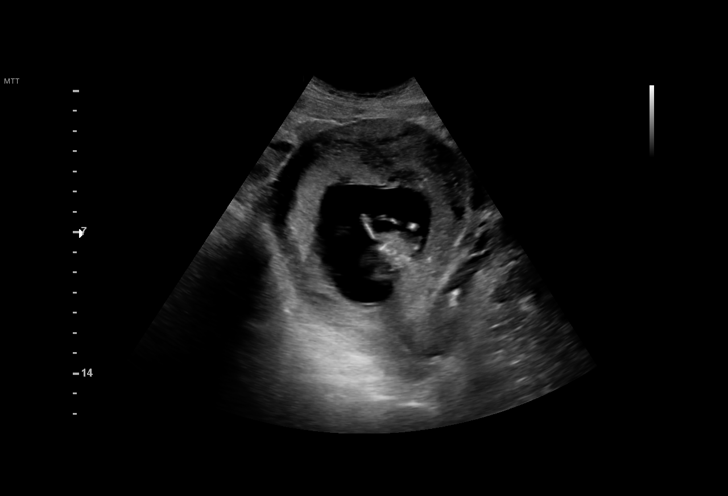
[im 3/25]
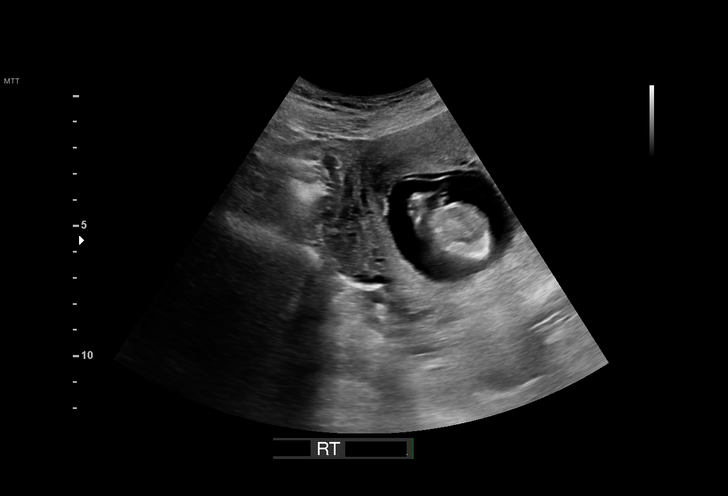
[im 5/25]
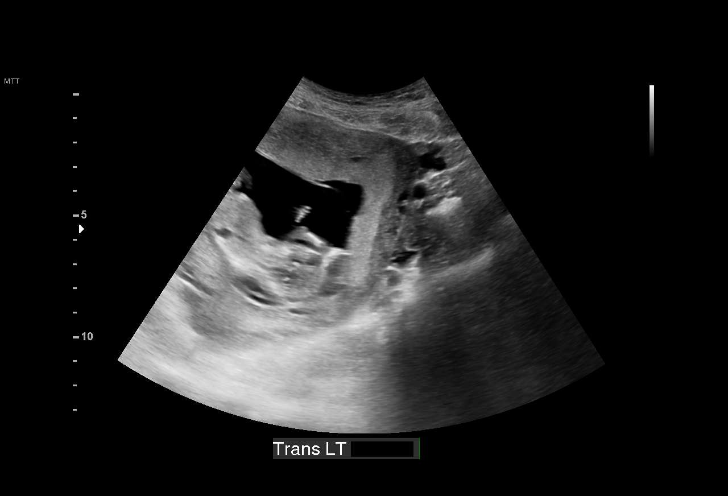
[im 6/25]
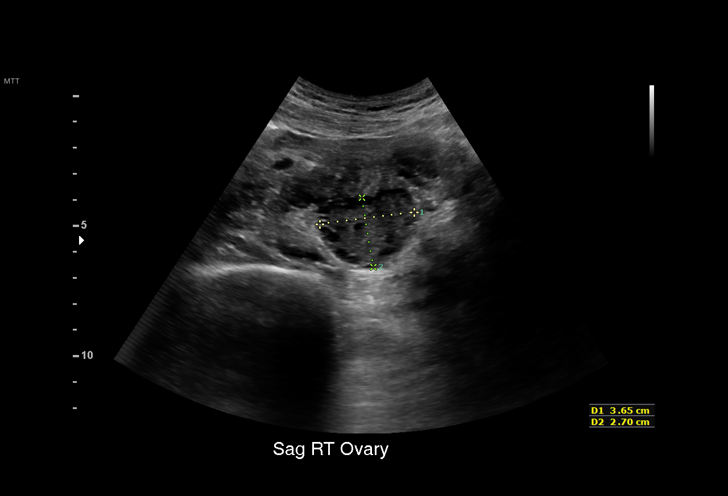
[im 8/25]
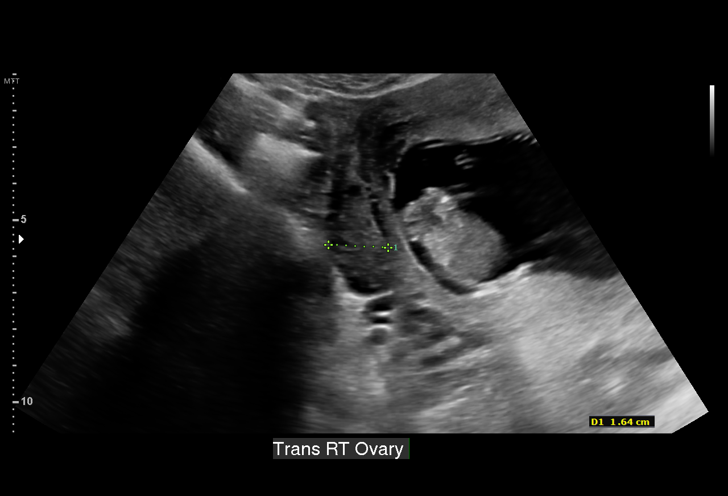
[im 10/25]
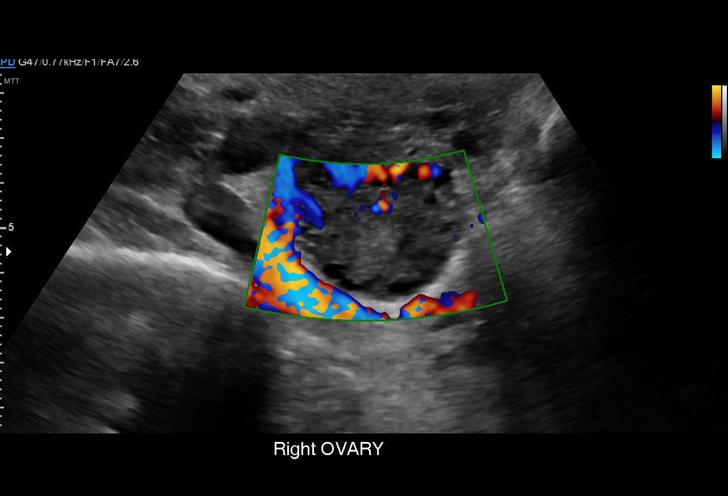
[im 11/25]
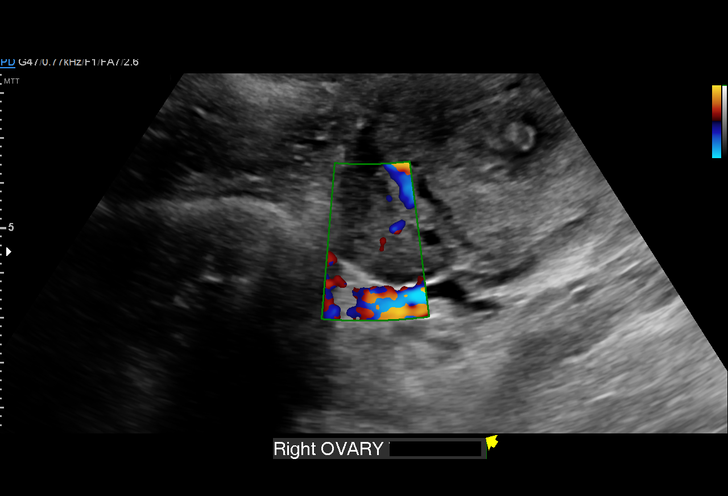
[im 13/25]
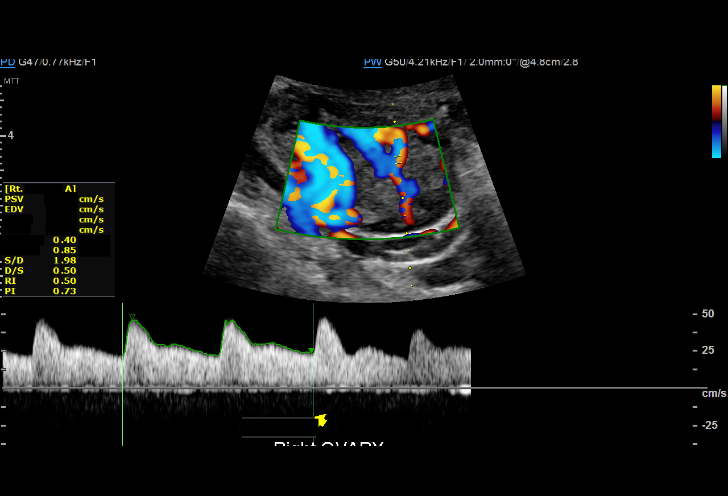
[im 15/25]
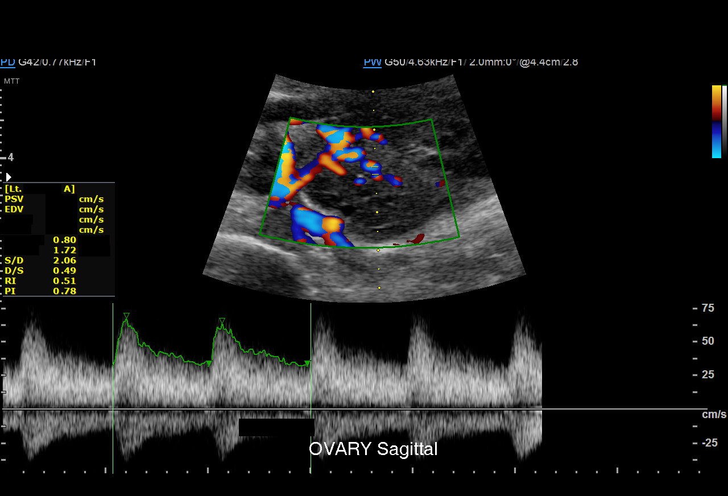
[im 16/25]
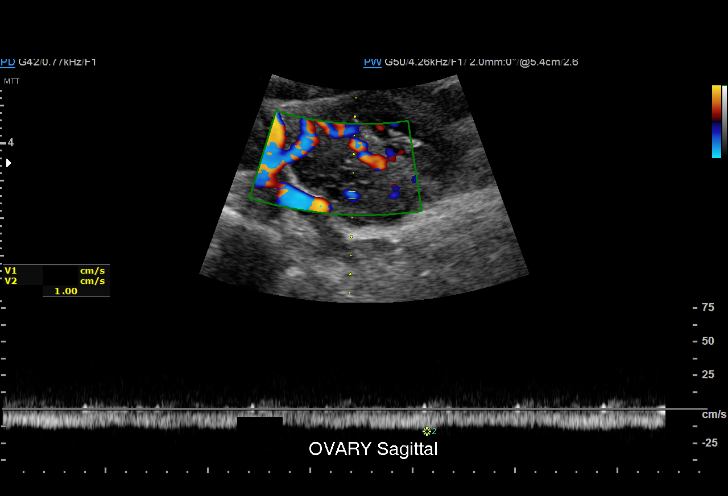
[im 18/25]
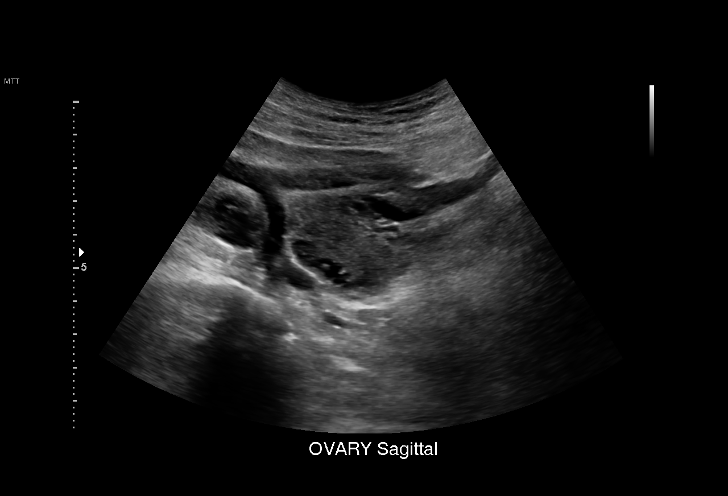
[im 20/25]
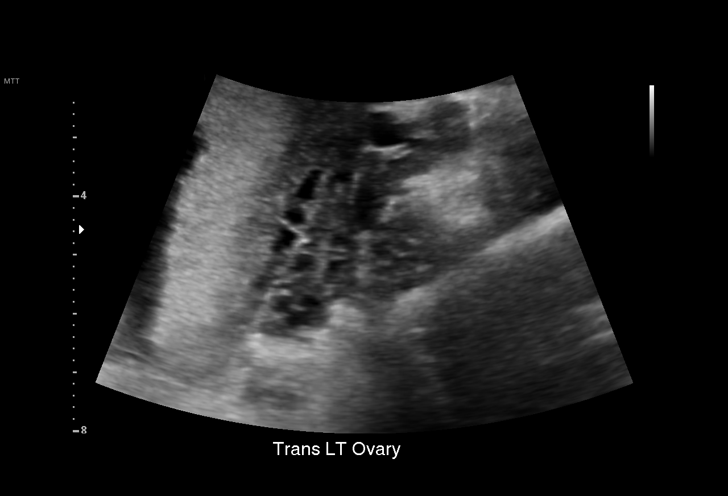
[im 21/25]
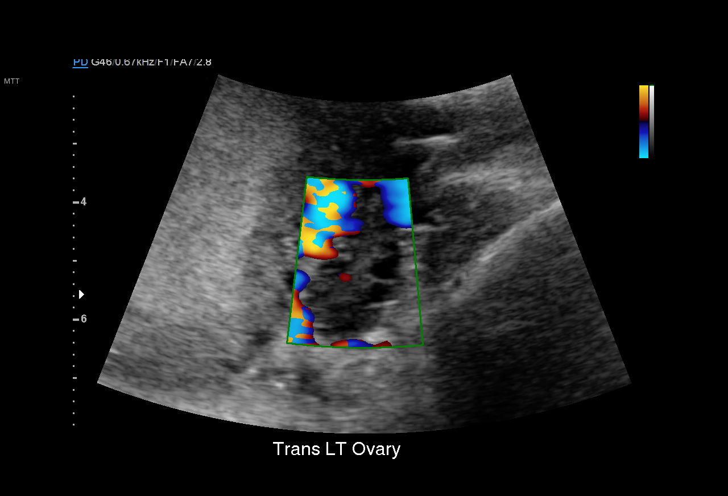
[im 23/25]
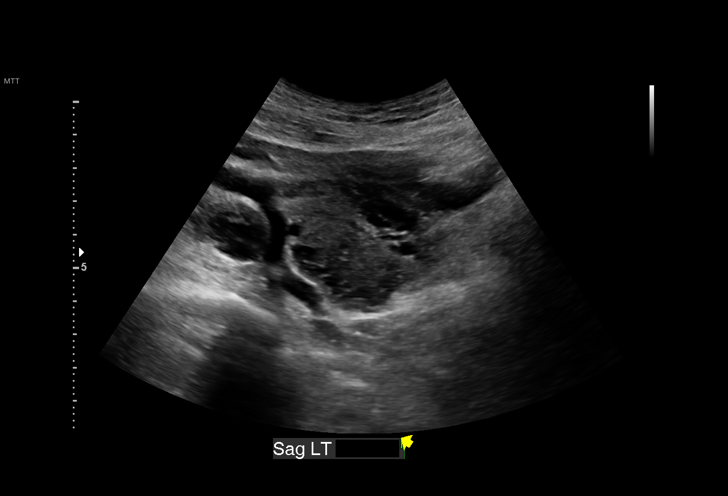
[im 25/25]
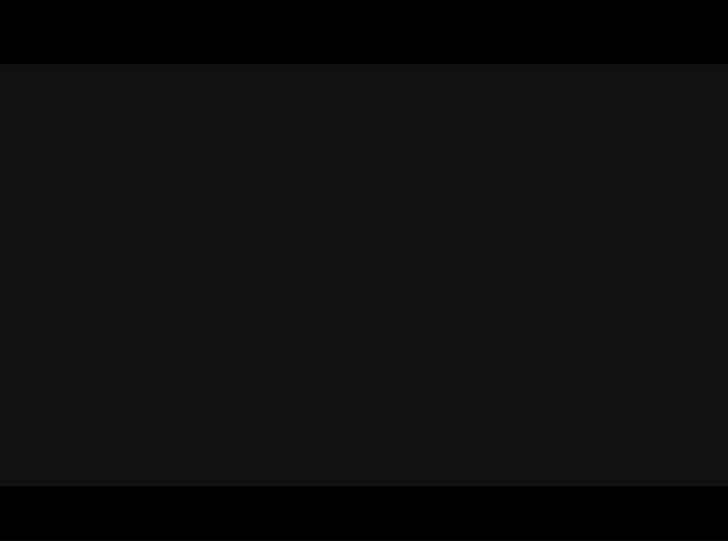

[15 of 25 positions shown; findings below may reference images not displayed]

FINDINGS: Right ovary

Measurements: 3.7 x 2.7 x 1.6 cm. Normal morphology without mass.
Internal blood flow present on color Doppler imaging.

Left ovary

Measurements: 3.1 x 1.9 x 1.2 cm. Normal morphology without mass.
Internal blood flow present on color Doppler imaging.

Pulsed Doppler evaluation demonstrates normal low-resistance
arterial and venous waveforms in both ovaries.
IMPRESSION: Normal appearing ovaries, both containing internal low resistance
arterial and venous flow by duplex imaging.

No evidence of ovarian torsion or mass.

Assessment of the gravid uterus was not requested and was not
performed.

## 2018-04-11 ENCOUNTER — Inpatient Hospital Stay (HOSPITAL_COMMUNITY)
Admission: AD | Admit: 2018-04-11 | Discharge: 2018-04-12 | Disposition: A | Discharge status: Home / Self Care | Payer: Self-pay | Source: Ambulatory Visit | Attending: Obstetrics and Gynecology | Admitting: Obstetrics and Gynecology

## 2018-04-11 ENCOUNTER — Encounter (HOSPITAL_COMMUNITY): Payer: Self-pay

## 2018-04-11 DIAGNOSIS — Z113 Encounter for screening for infections with a predominantly sexual mode of transmission: Secondary | ICD-10-CM

## 2018-04-11 DIAGNOSIS — Z202 Contact with and (suspected) exposure to infections with a predominantly sexual mode of transmission: Secondary | ICD-10-CM | POA: Insufficient documentation

## 2018-04-11 DIAGNOSIS — Z87891 Personal history of nicotine dependence: Secondary | ICD-10-CM | POA: Insufficient documentation

## 2018-04-11 DIAGNOSIS — N898 Other specified noninflammatory disorders of vagina: Secondary | ICD-10-CM | POA: Insufficient documentation

## 2018-04-11 DIAGNOSIS — Z3202 Encounter for pregnancy test, result negative: Secondary | ICD-10-CM | POA: Insufficient documentation

## 2018-04-11 LAB — POCT PREGNANCY, URINE: Preg Test, Ur: NEGATIVE

## 2018-04-11 NOTE — MAU Provider Note (Signed)
Chief Complaint: Possible Pregnancy   First Provider Initiated Contact with Patient 04/11/18 2355     SUBJECTIVE HPI: Ana [name] is a 28 y.o. non pregnant female who presents to Maternity Admissions reporting possible STD exposure.  Has been sexually active with new partner for the last few months. Recently found out that he has been sleeping with other people as well. States she doesn't routinely use condoms.  States he told her that she should be taking abx as he was also taking abx (for having a tooth pulled) which seemed strange to patient & has her concerned that he is taking abx for an STD.  Reports thick vaginal discharge. No itching or irritation. New onset dyspareunia. No postcoital bleeding. Denies abdominal pain, dysuria, fever/chills.   Past Medical History:  Diagnosis Date  . Abnormal Pap smear   . Asthma   . Chlamydia Jan 2014  . Kidney infection   . Vaginal Pap smear, abnormal    OB History  Gravida Para Term Preterm AB Living  8 6 6  0 2 6  SAB TAB Ectopic Multiple Live Births  2 0 0 0 6    # Outcome Date GA Lbr Len/2nd Weight Sex Delivery Anes PTL Lv  8 Term 10/14/16 2396w5d 09:30 / 00:17 3010 g Genella MechM Vag-Spont EPI  LIV  7 SAB 2017          6 Term 10/18/14 6036w0d 01:57 / 00:35 2535 g M Vag-Spont EPI  LIV     Birth Comments: WNL  5 SAB 08/29/12 4041w6d 112:20 / 00:03 170 g M Vag-Spont None  FD  4 Term 05/13/10   2722 g F Vag-Spont   LIV  3 Term 06/11/08   2722 g F Vag-Spont   LIV  2 Term 08/21/06   3118 g M Vag-Spont   LIV  1 Term 03/30/05   3175 g F Vag-Spont   LIV   Past Surgical History:  Procedure Laterality Date  . WISDOM TOOTH EXTRACTION     Social History   Socioeconomic History  . Marital status: Single    Spouse name: Not on file  . Number of children: Not on file  . Years of education: Not on file  . Highest education level: Not on file  Occupational History  . Not on file  Social Needs  . Financial resource strain: Not on file  . Food insecurity:     Worry: Not on file    Inability: Not on file  . Transportation needs:    Medical: Not on file    Non-medical: Not on file  Tobacco Use  . Smoking status: Former Smoker    Last attempt to quit: 04/25/2014    Years since quitting: 3.9  . Smokeless tobacco: Never Used  Substance and Sexual Activity  . Alcohol use: No    Comment: occasional/not since pregnancy  . Drug use: No    Comment: stopped 04/09/12 after she found out she was pregnant   LAST   SMOKED    2 WEEKS  AGO  . Sexual activity: Never    Birth control/protection: None  Lifestyle  . Physical activity:    Days per week: Not on file    Minutes per session: Not on file  . Stress: Not on file  Relationships  . Social connections:    Talks on phone: Not on file    Gets together: Not on file    Attends religious service: Not on file    Active member of  club or organization: Not on file    Attends meetings of clubs or organizations: Not on file    Relationship status: Not on file  . Intimate partner violence:    Fear of current or ex partner: Not on file    Emotionally abused: Not on file    Physically abused: Not on file    Forced sexual activity: Not on file  Other Topics Concern  . Not on file  Social History Narrative  . Not on file   Family History  Problem Relation Age of Onset  . Hypertension Mother   . Hypertension Maternal Grandmother    No current facility-administered medications on file prior to encounter.    Current Outpatient Medications on File Prior to Encounter  Medication Sig Dispense Refill  . ondansetron (ZOFRAN ODT) 4 MG disintegrating tablet Take 1 tablet (4 mg total) by mouth every 8 (eight) hours as needed for nausea or vomiting. 20 tablet 0   No Known Allergies  I have reviewed patient's Past Medical Hx, Surgical Hx, Family Hx, Social Hx, medications and allergies.   Review of Systems  Constitutional: Negative.   Gastrointestinal: Negative.   Genitourinary: Positive for dyspareunia  and vaginal discharge. Negative for dysuria and vaginal bleeding.    OBJECTIVE Patient Vitals for the past 24 hrs:  BP Temp Temp src Pulse Resp SpO2 Height Weight  04/12/18 0143 134/66 - - 71 19 - - -  04/11/18 2316 (!) 123/59 98.3 F (36.8 C) Oral 60 16 100 % 5\' 4"  (1.626 m) 72.6 kg   Constitutional: Well-developed, well-nourished female in no acute distress.  Cardiovascular: normal rate & rhythm, no murmur Respiratory: normal rate and effort. Lung sounds clear throughout GI: Abd soft, non-tender, Pos BS x 4. No guarding or rebound tenderness MS: Extremities nontender, no edema, normal ROM Neurologic: Alert and oriented x 4.  GU:     SPECULUM EXAM: NEFG, physiologic discharge, no blood noted, cervix clean  BIMANUAL: No CMT.  uterus normal size, no adnexal tenderness or masses.    LAB RESULTS Results for orders placed or performed during the hospital encounter of 04/11/18 (from the past 24 hour(s))  Urinalysis, Routine w reflex microscopic     Status: None   Collection Time: 04/11/18 11:32 PM  Result Value Ref Range   Color, Urine YELLOW YELLOW   APPearance CLEAR CLEAR   Specific Gravity, Urine 1.025 1.005 - 1.030   pH 6.5 5.0 - 8.0   Glucose, UA NEGATIVE NEGATIVE mg/dL   Hgb urine dipstick NEGATIVE NEGATIVE   Bilirubin Urine NEGATIVE NEGATIVE   Ketones, ur NEGATIVE NEGATIVE mg/dL   Protein, ur NEGATIVE NEGATIVE mg/dL   Nitrite NEGATIVE NEGATIVE   Leukocytes, UA NEGATIVE NEGATIVE  Pregnancy, urine POC     Status: None   Collection Time: 04/11/18 11:36 PM  Result Value Ref Range   Preg Test, Ur NEGATIVE NEGATIVE  Hepatitis B surface antigen     Status: None   Collection Time: 04/12/18 12:09 AM  Result Value Ref Range   Hepatitis B Surface Ag Negative Negative  Wet prep, genital     Status: Abnormal   Collection Time: 04/12/18 12:40 AM  Result Value Ref Range   Yeast Wet Prep HPF POC NONE SEEN NONE SEEN   Trich, Wet Prep NONE SEEN NONE SEEN   Clue Cells Wet Prep HPF POC  NONE SEEN NONE SEEN   WBC, Wet Prep HPF POC MODERATE (A) NONE SEEN   Sperm NONE SEEN  IMAGING No results found.  MAU COURSE Orders Placed This Encounter  Procedures  . Wet prep, genital  . Urinalysis, Routine w reflex microscopic  . Hepatitis B surface antigen  . Pregnancy, urine POC  . Discharge patient   Meds ordered this encounter  Medications  . AND Linked Order Group   . cefTRIAXone (ROCEPHIN) injection 250 mg     Order Specific Question:   Antibiotic Indication:     Answer:   STD   . azithromycin (ZITHROMAX) tablet 1,000 mg    MDM UPT negative GC/CT, wet prep, HIV, RPR, HCV, HBV ordered for STD screening Wet prep negative Pt would like to go ahead & receive GC tx while here in MAU. Rocephin & azithromycin given.  Benign exam & no CMT ASSESSMENT 1. STD exposure   2. Screen for STD (sexually transmitted disease)   3. Negative pregnancy test     PLAN Discharge home in stable condition. STD labs pending No intercourse x 2 weeks  Allergies as of 04/12/2018   No Known Allergies     Medication List    STOP taking these medications   oseltamivir 75 MG capsule Commonly known as:  TAMIFLU     TAKE these medications   ondansetron 4 MG disintegrating tablet Commonly known as:  ZOFRAN ODT Take 1 tablet (4 mg total) by mouth every 8 (eight) hours as needed for nausea or vomiting.        Judeth HornLawrence, Lewie Deman, NP 04/12/2018  8:05 AM

## 2018-04-11 NOTE — MAU Note (Signed)
Pt here for pregnancy test and STD testing. States she is with a guy who is sleeping with other people. Reports he is taking antibiotics and he told her "she should share his antibiotic". States she is having vaginal discharge. States the discharge looks like snot, but does not have a smell or itching. Pt denies vaginal bleeding. Pt reports painful sex. Pt reports some lower abdominal cramping. LMP: 03/28/2018

## 2018-04-12 DIAGNOSIS — Z113 Encounter for screening for infections with a predominantly sexual mode of transmission: Secondary | ICD-10-CM

## 2018-04-12 DIAGNOSIS — Z202 Contact with and (suspected) exposure to infections with a predominantly sexual mode of transmission: Secondary | ICD-10-CM

## 2018-04-12 LAB — URINALYSIS, ROUTINE W REFLEX MICROSCOPIC
BILIRUBIN URINE: NEGATIVE
GLUCOSE, UA: NEGATIVE mg/dL
HGB URINE DIPSTICK: NEGATIVE
Ketones, ur: NEGATIVE mg/dL
Leukocytes, UA: NEGATIVE
Nitrite: NEGATIVE
PROTEIN: NEGATIVE mg/dL
SPECIFIC GRAVITY, URINE: 1.025 (ref 1.005–1.030)
pH: 6.5 (ref 5.0–8.0)

## 2018-04-12 LAB — WET PREP, GENITAL
Clue Cells Wet Prep HPF POC: NONE SEEN
Sperm: NONE SEEN
Trich, Wet Prep: NONE SEEN
Yeast Wet Prep HPF POC: NONE SEEN

## 2018-04-12 LAB — HIV ANTIBODY (ROUTINE TESTING W REFLEX): HIV Screen 4th Generation wRfx: NONREACTIVE

## 2018-04-12 LAB — GC/CHLAMYDIA PROBE AMP (~~LOC~~) NOT AT ARMC
Chlamydia: POSITIVE — AB
Neisseria Gonorrhea: NEGATIVE

## 2018-04-12 LAB — HEPATITIS B SURFACE ANTIGEN: HEP B S AG: NEGATIVE

## 2018-04-12 LAB — RPR: RPR Ser Ql: NONREACTIVE

## 2018-04-12 MED ORDER — CEFTRIAXONE SODIUM 250 MG IJ SOLR
250.0000 mg | Freq: Once | INTRAMUSCULAR | Status: AC
  Administered 2018-04-12: 250 mg via INTRAMUSCULAR
  Filled 2018-04-12: qty 250

## 2018-04-12 MED ORDER — AZITHROMYCIN 250 MG PO TABS
1000.0000 mg | ORAL_TABLET | Freq: Once | ORAL | Status: AC
  Administered 2018-04-12: 1000 mg via ORAL
  Filled 2018-04-12: qty 4

## 2018-04-12 NOTE — Discharge Instructions (Signed)

## 2018-04-13 LAB — HEPATITIS C ANTIBODY

## 2018-06-01 ENCOUNTER — Encounter: Payer: Self-pay | Admitting: Family Medicine

## 2018-06-01 ENCOUNTER — Telehealth: Payer: Self-pay | Admitting: Family Medicine

## 2018-06-01 DIAGNOSIS — R05 Cough: Secondary | ICD-10-CM

## 2018-06-01 DIAGNOSIS — J3489 Other specified disorders of nose and nasal sinuses: Secondary | ICD-10-CM

## 2018-06-01 DIAGNOSIS — R059 Cough, unspecified: Secondary | ICD-10-CM

## 2018-06-01 MED ORDER — BENZONATATE 100 MG PO CAPS: 100.0000 mg | ORAL_CAPSULE | Freq: Three times a day (TID) | ORAL | 0 refills | Status: DC | PRN

## 2018-06-01 NOTE — Progress Notes (Signed)
Based on your current symptoms, it seems unlikely that your symptoms are related to the Coronavirus.    At least 5-10 min. Were spent on this patient encounter through record and history review, suspected diagnosis research and response to patient.  Coronavirus disease 2019 (COVID-19) is a respiratory illness that can spread from person to person. The virus that causes COVID-19 is a new virus that was first identified in the country of [place] but is now found in multiple other countries and has spread to the [place].  Symptoms associated with the virus are mild to severe fever, cough, and shortness of breath. There is currently no vaccine to protect against COVID-19, and there is no specific antiviral treatment for the virus.   To be considered HIGH RISK for Coronavirus (COVID-19), you have to meet the following criteria:  . Traveled to [place], [place], [place], [place] or [place]; or in the [place] to [place], [place], [place], or [place]; and have fever, cough, and shortness of breath within the last 2 weeks of travel OR  . Been in close contact with a person diagnosed with COVID-19 within the last 2 weeks and have fever, cough, and shortness of breath  . IF YOU DO NOT MEET THESE CRITERIA, YOU ARE CONSIDERED LOW RISK FOR COVID-19.   It is vitally important that if you feel that you have an infection such as this virus or any other virus that you stay home and away from places where you may spread it to others.  You should self-quarantine for 14 days if you have symptoms that could potentially be coronavirus and avoid contact with people age 29 and older.   You can use medication such as A prescription cough medication called Tessalon Perles 100 mg. You may take 1-2 capsules every 8 hours as needed for cough  You may also take acetaminophen (Tylenol) as needed for fever.  I will also prescribe for you a nasal steroid (Flonase) for the runny nose as your symptoms could be induced  by allergies. I also suggest that you reach out to your son's pediatrician for a discussion about their symptoms that seem mild, also low risk for Covid and as if it would respond to supportive treatment as well.   Reduce your risk of any infection by using the same precautions used for avoiding the common cold or flu:  Marland Kitchen Wash your hands often with soap and warm water for at least 20 seconds.  If soap and water are not readily available, use an alcohol-based hand sanitizer with at least 60% alcohol.  . If coughing or sneezing, cover your mouth and nose by coughing or sneezing into the elbow areas of your shirt or coat, into a tissue or into your sleeve (not your hands). . Avoid shaking hands with others and consider head nods or verbal greetings only. . Avoid touching your eyes, nose, or mouth with unwashed hands.  . Avoid close contact with people who are sick. . Avoid places or events with large numbers of people in one location, like concerts or sporting events. . Carefully consider travel plans you have or are making. . If you are planning any travel outside or inside the [place], visit the CDC's Travelers' Health webpage for the latest health notices. . If you have some symptoms but not all symptoms, continue to monitor at home and seek medical attention if your symptoms worsen. . If you are having a medical emergency, call 911.  HOME CARE .  Only take medications as instructed by your medical team. . Drink plenty of fluids and get plenty of rest. . A steam or ultrasonic humidifier can help if you have congestion.   GET HELP RIGHT AWAY IF: . You develop worsening fever. . You become short of breath . You cough up blood. . Your symptoms become more severe MAKE SURE YOU   Understand these instructions.  Will watch your condition.  Will get help right away if you are not doing well or get worse.  Your e-visit answers were reviewed by a board certified advanced clinical practitioner to  complete your personal care plan.  Depending on the condition, your plan could have included both over the counter or prescription medications.  If there is a problem please reply once you have received a response from your provider. Your safety is important to [place].  If you have drug allergies check your prescription carefully.    You can use MyChart to ask questions about today's visit, request a non-urgent call back, or ask for a work or school excuse for 24 hours related to this e-Visit. If it has been greater than 24 hours you will need to follow up with your provider, or enter a new e-Visit to address those concerns. You will get an e-mail in the next two days asking about your experience.  I hope that your e-visit has been valuable and will speed your recovery. Thank you for using e-visits.

## 2018-07-15 ENCOUNTER — Ambulatory Visit (HOSPITAL_COMMUNITY)
Admission: EM | Admit: 2018-07-15 | Discharge: 2018-07-15 | Disposition: A | Discharge status: Home / Self Care | Payer: Self-pay | Attending: Emergency Medicine | Admitting: Emergency Medicine

## 2018-07-15 ENCOUNTER — Encounter (HOSPITAL_COMMUNITY): Payer: Self-pay

## 2018-07-15 ENCOUNTER — Other Ambulatory Visit: Payer: Self-pay

## 2018-07-15 DIAGNOSIS — J029 Acute pharyngitis, unspecified: Secondary | ICD-10-CM

## 2018-07-15 LAB — POCT RAPID STREP A: Streptococcus, Group A Screen (Direct): NEGATIVE

## 2018-07-15 MED ORDER — AMOXICILLIN 400 MG/5ML PO SUSR: 500.0000 mg | Freq: Two times a day (BID) | ORAL | 0 refills | Status: AC

## 2018-07-15 NOTE — Discharge Instructions (Addendum)
Strep test negative, will send out for culture and we will call you with results However, based on physical exam we will go ahead and treat you for potential strep.   Push fluids and get rest Prescribed amoxicillin.  Take as directed and to completion.  Drink warm or cool liquids, use throat lozenges, or popsicles to help alleviate symptoms Take OTC ibuprofen or tylenol as needed for pain Follow up with PCP or [facility] if symptoms persist Call or go to ER if you have any new or worsening symptoms such as fever, chills, nausea, vomiting, worsening sore throat, cough, abdominal pain, chest pain, changes in bowel or bladder habits, etc..Marland Kitchen

## 2018-07-15 NOTE — ED Triage Notes (Signed)
Patient presents to Urgent Care with complaints of sore throat and headache since last night. Patient reports she feels like she has strep throat.

## 2018-07-15 NOTE — ED Provider Notes (Signed)
[facility]   [id number] 07/15/18 Arrival Time: 1133  AC:ZYSA THROAT  SUBJECTIVE: History from: patient.  Analice Holtz is a 28 y.o. female who presents with abrupt onset of sore throat that began today.  Denies sick exposure to COVID, flu or strep.  Denies recent travel.  Has tried OTC medications without relief.  Symptoms are made worse with swallowing, but tolerating liquids and own secretions without difficulty.  Reports previous symptoms in the past and diagnosed with strep throat. Denies fever, chills, fatigue, ear pain, sinus pain, rhinorrhea, nasal congestion, cough, SOB, wheezing, chest pain, nausea, rash, changes in bowel or bladder habits.    ROS: As per HPI.  Past Medical History:  Diagnosis Date  . Abnormal Pap smear   . Asthma   . Chlamydia Jan 2014  . Kidney infection   . Vaginal Pap smear, abnormal    Past Surgical History:  Procedure Laterality Date  . WISDOM TOOTH EXTRACTION     No Known Allergies No current facility-administered medications on file prior to encounter.    No current outpatient medications on file prior to encounter.   Social History   Socioeconomic History  . Marital status: Single    Spouse name: Not on file  . Number of children: Not on file  . Years of education: Not on file  . Highest education level: Not on file  Occupational History  . Not on file  Social Needs  . Financial resource strain: Not on file  . Food insecurity:    Worry: Not on file    Inability: Not on file  . Transportation needs:    Medical: Not on file    Non-medical: Not on file  Tobacco Use  . Smoking status: Former Smoker    Last attempt to quit: 04/25/2014    Years since quitting: 4.2  . Smokeless tobacco: Never Used  Substance and Sexual Activity  . Alcohol use: No    Comment: occasional/not since pregnancy  . Drug use: No    Comment: stopped 04/09/12 after she found out she was pregnant   LAST   SMOKED    2 WEEKS  AGO  . Sexual activity:  Never    Birth control/protection: None  Lifestyle  . Physical activity:    Days per week: Not on file    Minutes per session: Not on file  . Stress: Not on file  Relationships  . Social connections:    Talks on phone: Not on file    Gets together: Not on file    Attends religious service: Not on file    Active member of club or organization: Not on file    Attends meetings of clubs or organizations: Not on file    Relationship status: Not on file  . Intimate partner violence:    Fear of current or ex partner: Not on file    Emotionally abused: Not on file    Physically abused: Not on file    Forced sexual activity: Not on file  Other Topics Concern  . Not on file  Social History Narrative  . Not on file   Family History  Problem Relation Age of Onset  . Hypertension Mother   . Hypertension Maternal Grandmother     OBJECTIVE:  Vitals:   07/15/18 1149  BP: 135/85  Pulse: 84  Resp: 18  Temp: 98.8 F (37.1 C)  TempSrc: Oral  SpO2: 100%     General appearance: alert; mildly fatigued appearing, nontoxic, speaking in  full sentences and managing own secretions HEENT: NCAT; Ears: EACs clear, TMs pearly gray with visible cone of light, without erythema; Eyes: PERRL, EOMI grossly; Nose: no obvious rhinorrhea; Throat: oropharynx clear, tonsils 1+ and mildly erythematous with white tonsillar exudates, uvula midline Neck: supple without LAD Lungs: CTA bilaterally without adventitious breath sounds; cough absent Heart: regular rate and rhythm.  Radial pulses 2+ symmetrical bilaterally Skin: warm and dry Psychological: alert and cooperative; normal mood and affect  LABS: Results for orders placed or performed during the hospital encounter of 07/15/18 (from the past 24 hour(s))  POCT rapid strep A [facility])     Status: None   Collection Time: 07/15/18 11:59 AM  Result Value Ref Range   Streptococcus, Group A Screen (Direct) NEGATIVE NEGATIVE     ASSESSMENT & PLAN:   1. Acute pharyngitis, unspecified etiology     Meds ordered this encounter  Medications  . amoxicillin (AMOXIL) 400 MG/5ML suspension    Sig: Take 6.3 mLs (500 mg total) by mouth 2 (two) times daily for 10 days.    Dispense:  126 mL    Refill:  0    Order Specific Question:   Supervising Provider    Answer:   Eustace MooreELSON, YVONNE SUE [[id number]]    Strep test negative, will send out for culture and we will call you with results However, based on physical exam we will go ahead and treat you for potential strep.   Push fluids and get rest Prescribed amoxicillin.  Take as directed and to completion.  Drink warm or cool liquids, use throat lozenges, or popsicles to help alleviate symptoms Take OTC ibuprofen or tylenol as needed for pain Follow up with PCP if symptoms persist Call or go to ER if you have any new or worsening symptoms such as fever, chills, nausea, vomiting, worsening sore throat, cough, abdominal pain, chest pain, changes in bowel or bladder habits, etc...  Reviewed expectations re: course of current medical issues. Questions answered. Outlined signs and symptoms indicating need for more acute intervention. Patient verbalized understanding. After Visit Summary given.        Rennis HardingWurst, [place] Steidle, PA-C 07/15/18 1221

## 2018-07-16 LAB — CULTURE, GROUP A STREP (THRC)

## 2018-09-12 ENCOUNTER — Telehealth: Payer: Self-pay

## 2018-11-01 ENCOUNTER — Encounter (HOSPITAL_COMMUNITY): Payer: Self-pay

## 2018-11-01 ENCOUNTER — Ambulatory Visit (HOSPITAL_COMMUNITY)
Admission: EM | Admit: 2018-11-01 | Discharge: 2018-11-01 | Disposition: A | Discharge status: Home / Self Care | Payer: Self-pay | Attending: Family Medicine | Admitting: Family Medicine

## 2018-11-01 ENCOUNTER — Other Ambulatory Visit: Payer: Self-pay

## 2018-11-01 DIAGNOSIS — N898 Other specified noninflammatory disorders of vagina: Secondary | ICD-10-CM | POA: Insufficient documentation

## 2018-11-01 DIAGNOSIS — Z113 Encounter for screening for infections with a predominantly sexual mode of transmission: Secondary | ICD-10-CM

## 2018-11-01 DIAGNOSIS — Z202 Contact with and (suspected) exposure to infections with a predominantly sexual mode of transmission: Secondary | ICD-10-CM | POA: Insufficient documentation

## 2018-11-01 DIAGNOSIS — Z3202 Encounter for pregnancy test, result negative: Secondary | ICD-10-CM

## 2018-11-01 LAB — POCT PREGNANCY, URINE: Preg Test, Ur: NEGATIVE

## 2018-11-01 MED ORDER — AZITHROMYCIN 250 MG PO TABS
ORAL_TABLET | ORAL | Status: AC
  Filled 2018-11-01: qty 4

## 2018-11-01 MED ORDER — CEFTRIAXONE SODIUM 250 MG IJ SOLR
INTRAMUSCULAR | Status: AC
  Filled 2018-11-01: qty 250

## 2018-11-01 MED ORDER — AZITHROMYCIN 250 MG PO TABS
1000.0000 mg | ORAL_TABLET | Freq: Once | ORAL | Status: AC
  Administered 2018-11-01: 1000 mg via ORAL

## 2018-11-01 MED ORDER — CEFTRIAXONE SODIUM 250 MG IJ SOLR
250.0000 mg | Freq: Once | INTRAMUSCULAR | Status: AC
  Administered 2018-11-01: 11:00:00 250 mg via INTRAMUSCULAR

## 2018-11-01 NOTE — ED Provider Notes (Signed)
[facility]    CSN: [id number] Arrival date & time: 11/01/18  0957      History   Chief Complaint Chief Complaint  Patient presents with  . Exposure to STD    HPI Ana [name] is a 28 y.o. female.   Patient is a 28 year old female past medical history of abnormal Pap smear, asthma, chlamydia, kidney function.  She presents today with exposure to STD.  Reporting that her partner tested positive from unknown STD week ago.  She has been having vaginal discharge, odor and irritation for the past week.  Mild abdominal cramping.  Denies any dysuria, hematuria or urinary frequency.  Denies any fevers or back pain.  Reporting a regular menstrual cycles.  ROS per HPI      Past Medical History:  Diagnosis Date  . Abnormal Pap smear   . Asthma   . Chlamydia Jan 2014  . Kidney infection   . Vaginal Pap smear, abnormal     Patient Active Problem List   Diagnosis Date Noted  . PROM (premature rupture of membranes) 10/14/2016  . SVD (spontaneous vaginal delivery) 10/14/2016  . GBS (group B Streptococcus carrier), +RV culture, currently pregnant 10/12/2016  . Drug abuse, marijuana 10/05/2016  . History of preterm delivery, currently pregnant 05/21/2016  . Supervision of high risk pregnancy, antepartum, second trimester 04/21/2016  . Asthma, mild intermittent 05/22/2014    Past Surgical History:  Procedure Laterality Date  . WISDOM TOOTH EXTRACTION      OB History    Gravida  8   Para  6   Term  6   Preterm  0   AB  2   Living  6     SAB  2   TAB  0   Ectopic  0   Multiple  0   Live Births  6            Home Medications    Prior to Admission medications   Not on File    Family History Family History  Problem Relation Age of Onset  . Hypertension Mother   . Hypertension Maternal Grandmother     Social History Social History   Tobacco Use  . Smoking status: Former Smoker    Quit date: 04/25/2014    Years since quitting: 4.5   . Smokeless tobacco: Never Used  Substance Use Topics  . Alcohol use: No    Comment: occasional/not since pregnancy  . Drug use: No    Comment: stopped 04/09/12 after she found out she was pregnant   LAST   SMOKED    2 WEEKS  AGO     Allergies   Patient has no known allergies.   Review of Systems Review of Systems   Physical Exam Triage Vital Signs ED Triage Vitals  Enc Vitals Group     BP 11/01/18 1028 (!) 133/99     Pulse Rate 11/01/18 1028 77     Resp 11/01/18 1028 18     Temp 11/01/18 1028 98.8 F (37.1 C)     Temp Source 11/01/18 1028 Oral     SpO2 11/01/18 1028 100 %     Weight --      Height --      Head Circumference --      Peak Flow --      Pain Score 11/01/18 1029 4     Pain Loc --      Pain Edu? --      Excl.  in [place]? --    No data found.  Updated Vital Signs BP (!) 133/99 (BP Location: Left Arm)   Pulse 77   Temp 98.8 F (37.1 C) (Oral)   Resp 18   SpO2 100%   Visual Acuity Right Eye Distance:   Left Eye Distance:   Bilateral Distance:    Right Eye Near:   Left Eye Near:    Bilateral Near:     Physical Exam Vitals signs and nursing note reviewed.  Constitutional:      General: She is not in acute distress.    Appearance: Normal appearance. She is not ill-appearing, toxic-appearing or diaphoretic.  HENT:     Head: Normocephalic.     Nose: Nose normal.     Mouth/Throat:     Pharynx: Oropharynx is clear.  Eyes:     Conjunctiva/sclera: Conjunctivae normal.  Neck:     Musculoskeletal: Normal range of motion.  Pulmonary:     Effort: Pulmonary effort is normal.  Abdominal:     Palpations: Abdomen is soft.     Tenderness: There is no abdominal tenderness.  Musculoskeletal: Normal range of motion.  Skin:    General: Skin is warm and dry.     Findings: No rash.  Neurological:     Mental Status: She is alert.  Psychiatric:        Mood and Affect: Mood normal.      UC Treatments / Results  Labs (all labs ordered are listed, but  only abnormal results are displayed) Labs Reviewed  POC URINE PREG, ED  POCT PREGNANCY, URINE  CERVICOVAGINAL ANCILLARY ONLY    EKG   Radiology No results found.  Procedures Procedures (including critical care time)  Medications Ordered in UC Medications  cefTRIAXone (ROCEPHIN) injection 250 mg (250 mg Intramuscular Given 11/01/18 1117)  azithromycin (ZITHROMAX) tablet 1,000 mg (1,000 mg Oral Given 11/01/18 1117)  azithromycin (ZITHROMAX) 250 MG tablet (has no administration in time range)  cefTRIAXone (ROCEPHIN) 250 MG injection (has no administration in time range)    Initial Impression / Assessment and Plan / UC Course  I have reviewed the triage vital signs and the nursing notes.  Pertinent labs & imaging results that were available during my care of the patient were reviewed by me and considered in my medical decision making (see chart for details).     Urine negative for pregnancy.  Sending self swab for STD screening.  Treating prophylactically for gonorrhea and chlamydia here in clinic. Labs pending. Instructed to check my chart for results. Final Clinical Impressions(s) / UC Diagnoses   Final diagnoses:  STD exposure  Vaginal discharge     Discharge Instructions     Treating you today prophylactically for gonorrhea and chlamydia.  We will send your swab for testing and call with any positive results.  Please refrain from sexual activity for at least 7 days until the infection is cleared.    ED Prescriptions    None     Controlled Substance Prescriptions [place] Controlled Substance Registry consulted? Not Applicable   Orvan July, NP 11/01/18 1203

## 2018-11-01 NOTE — Discharge Instructions (Addendum)
Treating you today prophylactically for gonorrhea and chlamydia.  We will send your swab for testing and call with any positive results.  Please refrain from sexual activity for at least 7 days until the infection is cleared.
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
## 2018-11-03 LAB — CERVICOVAGINAL ANCILLARY ONLY
Candida vaginitis: NEGATIVE
Chlamydia: POSITIVE — AB
Neisseria Gonorrhea: NEGATIVE
Trichomonas: NEGATIVE

## 2018-11-04 ENCOUNTER — Telehealth (HOSPITAL_COMMUNITY): Payer: Self-pay | Admitting: Emergency Medicine

## 2018-11-04 MED ORDER — METRONIDAZOLE 500 MG PO TABS: 500.0000 mg | ORAL_TABLET | Freq: Two times a day (BID) | ORAL | 0 refills | Status: AC

## 2018-11-04 NOTE — Telephone Encounter (Signed)
Chlamydia is positive.  This was treated at the urgent care visit with po zithromax 1g.  Pt needs education to please refrain from sexual intercourse for 7 days to give the medicine time to work.  Sexual partners need to be notified and tested/treated.  Condoms may reduce risk of reinfection.  Recheck or followup with PCP for further evaluation if symptoms are not improving.  [facility] notified.  Bacterial vaginosis is positive. This was not treated at the urgent care visit.  Flagyl 500 mg BID x 7 days #14 no refills sent to patients pharmacy of choice.    Patient contacted and made aware of    results, all questions answered   .

## 2019-03-08 ENCOUNTER — Emergency Department (HOSPITAL_COMMUNITY): Payer: Self-pay

## 2019-03-08 ENCOUNTER — Emergency Department (HOSPITAL_COMMUNITY)
Admission: EM | Admit: 2019-03-08 | Discharge: 2019-03-08 | Disposition: A | Discharge status: Home / Self Care | Payer: Self-pay | Attending: Emergency Medicine | Admitting: Emergency Medicine

## 2019-03-08 ENCOUNTER — Emergency Department (HOSPITAL_COMMUNITY)
Admission: EM | Admit: 2019-03-08 | Discharge: 2019-03-08 | Discharge status: Against Medical Advice | Payer: Self-pay | Attending: Emergency Medicine | Admitting: Emergency Medicine

## 2019-03-08 ENCOUNTER — Other Ambulatory Visit: Payer: Self-pay

## 2019-03-08 ENCOUNTER — Telehealth: Payer: Self-pay

## 2019-03-08 DIAGNOSIS — S92125A Nondisplaced fracture of body of left talus, initial encounter for closed fracture: Secondary | ICD-10-CM | POA: Insufficient documentation

## 2019-03-08 DIAGNOSIS — S93402A Sprain of unspecified ligament of left ankle, initial encounter: Secondary | ICD-10-CM

## 2019-03-08 DIAGNOSIS — S92102A Unspecified fracture of left talus, initial encounter for closed fracture: Secondary | ICD-10-CM

## 2019-03-08 DIAGNOSIS — Y939 Activity, unspecified: Secondary | ICD-10-CM | POA: Insufficient documentation

## 2019-03-08 DIAGNOSIS — Z20828 Contact with and (suspected) exposure to other viral communicable diseases: Secondary | ICD-10-CM | POA: Insufficient documentation

## 2019-03-08 DIAGNOSIS — R519 Headache, unspecified: Secondary | ICD-10-CM | POA: Insufficient documentation

## 2019-03-08 DIAGNOSIS — S99912A Unspecified injury of left ankle, initial encounter: Secondary | ICD-10-CM

## 2019-03-08 DIAGNOSIS — Z5321 Procedure and treatment not carried out due to patient leaving prior to being seen by health care provider: Secondary | ICD-10-CM | POA: Insufficient documentation

## 2019-03-08 DIAGNOSIS — S93492A Sprain of other ligament of left ankle, initial encounter: Secondary | ICD-10-CM | POA: Insufficient documentation

## 2019-03-08 DIAGNOSIS — Z87891 Personal history of nicotine dependence: Secondary | ICD-10-CM | POA: Insufficient documentation

## 2019-03-08 DIAGNOSIS — J029 Acute pharyngitis, unspecified: Secondary | ICD-10-CM | POA: Insufficient documentation

## 2019-03-08 DIAGNOSIS — Y999 Unspecified external cause status: Secondary | ICD-10-CM | POA: Insufficient documentation

## 2019-03-08 DIAGNOSIS — Y929 Unspecified place or not applicable: Secondary | ICD-10-CM | POA: Insufficient documentation

## 2019-03-08 DIAGNOSIS — J45909 Unspecified asthma, uncomplicated: Secondary | ICD-10-CM | POA: Insufficient documentation

## 2019-03-08 LAB — CBC WITH DIFFERENTIAL/PLATELET
Abs Immature Granulocytes: 0.04 10*3/uL (ref 0.00–0.07)
Basophils Absolute: 0 10*3/uL (ref 0.0–0.1)
Basophils Relative: 0 %
Eosinophils Absolute: 0 10*3/uL (ref 0.0–0.5)
Eosinophils Relative: 0 %
HCT: 38.6 % (ref 36.0–46.0)
Hemoglobin: 12.5 g/dL (ref 12.0–15.0)
Immature Granulocytes: 0 %
Lymphocytes Relative: 6 %
Lymphs Abs: 0.6 10*3/uL — ABNORMAL LOW (ref 0.7–4.0)
MCH: 29.4 pg (ref 26.0–34.0)
MCHC: 32.4 g/dL (ref 30.0–36.0)
MCV: 90.8 fL (ref 80.0–100.0)
Monocytes Absolute: 0.5 10*3/uL (ref 0.1–1.0)
Monocytes Relative: 4 %
Neutro Abs: 10.3 10*3/uL — ABNORMAL HIGH (ref 1.7–7.7)
Neutrophils Relative %: 90 %
Platelets: 201 10*3/uL (ref 150–400)
RBC: 4.25 MIL/uL (ref 3.87–5.11)
RDW: 13 % (ref 11.5–15.5)
WBC: 11.5 10*3/uL — ABNORMAL HIGH (ref 4.0–10.5)
nRBC: 0 % (ref 0.0–0.2)

## 2019-03-08 LAB — I-STAT BETA HCG BLOOD, ED (MC, WL, AP ONLY): I-stat hCG, quantitative: <5 m[IU]/mL (ref <5)

## 2019-03-08 LAB — BASIC METABOLIC PANEL
Anion gap: 10 (ref 5–15)
BUN: 7 mg/dL (ref 6–20)
CO2: 22 mmol/L (ref 22–32)
Calcium: 9.1 mg/dL (ref 8.9–10.3)
Chloride: 104 mmol/L (ref 98–111)
Creatinine, Ser: 0.86 mg/dL (ref 0.44–1.00)
GFR calc Af Amer: >60 mL/min (ref >60)
GFR calc non Af Amer: >60 mL/min (ref >60)
Glucose, Bld: 113 mg/dL — ABNORMAL HIGH (ref 70–99)
Potassium: 3.3 mmol/L — ABNORMAL LOW (ref 3.5–5.1)
Sodium: 136 mmol/L (ref 135–145)

## 2019-03-08 LAB — RESPIRATORY PANEL BY RT PCR (FLU A&B, COVID)
Influenza A by PCR: NEGATIVE
Influenza B by PCR: NEGATIVE
SARS Coronavirus 2 by RT PCR: NEGATIVE

## 2019-03-08 LAB — GROUP A STREP BY PCR: Group A Strep by PCR: NOT DETECTED

## 2019-03-08 MED ORDER — OXYCODONE-ACETAMINOPHEN 5-325 MG PO TABS
1.0000 | ORAL_TABLET | Freq: Once | ORAL | Status: AC
  Administered 2019-03-08: 1 via ORAL
  Filled 2019-03-08: qty 1

## 2019-03-08 MED ORDER — OXYCODONE-ACETAMINOPHEN 5-325 MG PO TABS: 1.0000 | ORAL_TABLET | ORAL | 0 refills | Status: DC | PRN

## 2019-03-08 MED ORDER — OXYCODONE-ACETAMINOPHEN 5-325 MG PO TABS
1.0000 | ORAL_TABLET | ORAL | Status: DC | PRN
  Administered 2019-03-08: 1 via ORAL
  Filled 2019-03-08: qty 1
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
## 2019-03-08 NOTE — ED Notes (Signed)
Pt asked NT Sophie a question about wait time, Sophie asked this NT about what to tell her. This NT stepped in to inform her that she has been here almost 5 hours when our longest wait is at 14 hours, that we could not give her an exact time of waiting. Pt began to yell at this NT stating that "you need to shut your smart mouth, I was talking to her and you walked over here like you are the police or something." This NT explained that I was telling her that she is towards the end of the waiting list. Security was called and took pt outside to speak to her, pt now yelling that she is anemic and was taken outside in the cold. Pt states that she has asked for help multiple times and has been ignored, this NT has not heard pt ask for anything other than a blanket that she was provided. Pt yelling to speak to charge nurse, charge nurse notified.
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
## 2019-03-08 NOTE — Discharge Instructions (Addendum)
Your imaging overnight did show concern for possible fracture of the ankle.  Given your history of injury of the ankle, it is unclear if this is new or not.  At minimum, you have a bad sprain.  We treated you as if you do have a fracture and placed you in a walking boot and gave you crutches.  Please use the pain medicine to help with your discomfort and follow-up with orthopedics for further management.  We also tested you for strep, Covid, and influenza.  You will be called with those results.  Please rest and stay hydrated.  If any symptoms change or worsen, please return to the nearest emergency department.
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
## 2019-04-03 ENCOUNTER — Encounter (HOSPITAL_COMMUNITY): Payer: Self-pay | Admitting: Emergency Medicine

## 2019-04-03 ENCOUNTER — Emergency Department (HOSPITAL_COMMUNITY)
Admission: EM | Admit: 2019-04-03 | Discharge: 2019-04-03 | Disposition: A | Discharge status: Home / Self Care | Payer: Self-pay | Attending: Emergency Medicine | Admitting: Emergency Medicine

## 2019-04-03 ENCOUNTER — Other Ambulatory Visit: Payer: Self-pay

## 2019-04-03 DIAGNOSIS — Z20822 Contact with and (suspected) exposure to covid-19: Secondary | ICD-10-CM | POA: Insufficient documentation

## 2019-04-03 DIAGNOSIS — N72 Inflammatory disease of cervix uteri: Secondary | ICD-10-CM | POA: Insufficient documentation

## 2019-04-03 DIAGNOSIS — J45909 Unspecified asthma, uncomplicated: Secondary | ICD-10-CM | POA: Insufficient documentation

## 2019-04-03 LAB — HIV ANTIBODY (ROUTINE TESTING W REFLEX): HIV Screen 4th Generation wRfx: NONREACTIVE

## 2019-04-03 MED ORDER — DOXYCYCLINE HYCLATE 100 MG PO CAPS: 100.0000 mg | ORAL_CAPSULE | Freq: Two times a day (BID) | ORAL | 0 refills | Status: DC

## 2019-04-03 MED ORDER — STERILE WATER FOR INJECTION IJ SOLN
INTRAMUSCULAR | Status: AC
  Filled 2019-04-03: qty 10

## 2019-04-03 MED ORDER — CEFTRIAXONE SODIUM 250 MG IJ SOLR
250.0000 mg | Freq: Once | INTRAMUSCULAR | Status: AC
  Administered 2019-04-03: 250 mg via INTRAMUSCULAR
  Filled 2019-04-03: qty 250

## 2019-04-03 NOTE — ED Notes (Signed)
ED Provider at bedside. 

## 2019-04-03 NOTE — ED Triage Notes (Signed)
Pt reports that she was exposed to Covid that she took to be tested on Friday. Denies any symptoms. Wants to be checked for her vaginal discharge that she has had for a week. Reports keeps keeping GC and chlamydia

## 2019-04-03 NOTE — Discharge Instructions (Addendum)
Follow-up with the women's clinic in a week.  Your Covid test will be back in a couple days.  Your other test should be back when you are seen in the clinic

## 2019-04-03 NOTE — ED Provider Notes (Signed)
[facility] DEPT Provider Note   CSN: [id number] Arrival date & time: 04/03/19  0820     History Chief Complaint  Patient presents with  . Vaginal Discharge  . Covid exposure    Ana [name] is a 29 y.o. female.  Complains of vaginal discharge.  She has a history of chlamydia.  Patient's periods have been normal she is having no abdominal pain  The history is provided by the patient. No language interpreter was used.  Vaginal Discharge Quality:  Mucopurulent Severity:  Mild Onset quality:  Sudden Timing:  Constant Progression:  Worsening Chronicity:  New Context: after intercourse   Relieved by:  Nothing Worsened by:  Nothing Ineffective treatments:  None tried Associated symptoms: no abdominal pain   Risk factors: no endometriosis        Past Medical History:  Diagnosis Date  . Abnormal Pap smear   . Asthma   . Chlamydia Jan 2014  . Kidney infection   . Vaginal Pap smear, abnormal     Patient Active Problem List   Diagnosis Date Noted  . PROM (premature rupture of membranes) 10/14/2016  . SVD (spontaneous vaginal delivery) 10/14/2016  . GBS (group B Streptococcus carrier), +RV culture, currently pregnant 10/12/2016  . Drug abuse, marijuana 10/05/2016  . History of preterm delivery, currently pregnant 05/21/2016  . Supervision of high risk pregnancy, antepartum, second trimester 04/21/2016  . Asthma, mild intermittent 05/22/2014    Past Surgical History:  Procedure Laterality Date  . WISDOM TOOTH EXTRACTION       OB History    Gravida  8   Para  6   Term  6   Preterm  0   AB  2   Living  6     SAB  2   TAB  0   Ectopic  0   Multiple  0   Live Births  6           Family History  Problem Relation Age of Onset  . Hypertension Mother   . Hypertension Maternal Grandmother     Social History   Tobacco Use  . Smoking status: Former Smoker    Quit date: 04/25/2014    Years since quitting: 4.9    . Smokeless tobacco: Never Used  Substance Use Topics  . Alcohol use: No    Comment: occasional/not since pregnancy  . Drug use: No    Comment: stopped 04/09/12 after she found out she was pregnant   LAST   SMOKED    2 WEEKS  AGO    Home Medications Prior to Admission medications   Medication Sig Start Date End Date Taking? Authorizing Provider  doxycycline (VIBRAMYCIN) 100 MG capsule Take 1 capsule (100 mg total) by mouth 2 (two) times daily. One po bid x 7 days 04/03/19   Milton Ferguson, MD  oxyCODONE-acetaminophen (PERCOCET/ROXICET) 5-325 MG tablet Take 1 tablet by mouth every 4 (four) hours as needed for severe pain. 03/08/19   Tegeler, Gwenyth Allegra, MD    Allergies    Patient has no known allergies.  Review of Systems   Review of Systems  Constitutional: Negative for appetite change and fatigue.  HENT: Negative for congestion, ear discharge and sinus pressure.   Eyes: Negative for discharge.  Respiratory: Negative for cough.   Cardiovascular: Negative for chest pain.  Gastrointestinal: Negative for abdominal pain and diarrhea.  Genitourinary: Positive for vaginal discharge. Negative for frequency and hematuria.  Musculoskeletal: Negative for back pain.  Skin: Negative for rash.  Neurological: Negative for seizures and headaches.  Psychiatric/Behavioral: Negative for hallucinations.    Physical Exam Updated Vital Signs BP 125/60   Pulse 65   Temp 98.7 F (37.1 C) (Oral)   Resp 17   LMP 03/12/2019   SpO2 100%   Physical Exam Vitals and nursing note reviewed.  Constitutional:      Appearance: She is well-developed.  HENT:     Head: Normocephalic.     Nose: Nose normal.  Eyes:     General: No scleral icterus.    Conjunctiva/sclera: Conjunctivae normal.  Neck:     Thyroid: No thyromegaly.  Cardiovascular:     Rate and Rhythm: Normal rate and regular rhythm.     Heart sounds: No murmur. No friction rub. No gallop.   Pulmonary:     Breath sounds: No stridor.  No wheezing or rales.  Chest:     Chest wall: No tenderness.  Abdominal:     General: There is no distension.     Tenderness: There is no abdominal tenderness. There is no rebound.  Genitourinary:    Comments: Mild discharge from cervix no tenderness Musculoskeletal:        General: Normal range of motion.     Cervical back: Neck supple.  Lymphadenopathy:     Cervical: No cervical adenopathy.  Skin:    Findings: No erythema or rash.  Neurological:     Mental Status: She is alert and oriented to person, place, and time.     Motor: No abnormal muscle tone.     Coordination: Coordination normal.  Psychiatric:        Behavior: Behavior normal.     ED Results / Procedures / Treatments   Labs (all labs ordered are listed, but only abnormal results are displayed) Labs Reviewed  NOVEL CORONAVIRUS, NAA (HOSP ORDER, SEND-OUT TO REF LAB; TAT 18-24 HRS)  RPR  GC/CHLAMYDIA PROBE AMP ([place]) NOT AT [facility]  GC/CHLAMYDIA PROBE AMP ([place]) NOT AT [facility]   Patient with vaginal discharge.  Patient has GC chlamydia RPR headache IV pending along with Covid.  She is going to follow-up with the women's clinic EKG None  Radiology No results found.  Procedures Procedures (including critical care time)  Medications Ordered in ED Medications  cefTRIAXone (ROCEPHIN) injection 250 mg (has no administration in time range)    ED Course  I have reviewed the triage vital signs and the nursing notes.  Pertinent labs & imaging results that were available during my care of the patient were reviewed by me and considered in my medical decision making (see chart for details).    MDM Rules/Calculators/A&P                       Final Clinical Impression(s) / ED Diagnoses Final diagnoses:  Cervicitis    Rx / DC Orders ED Discharge Orders         Ordered    doxycycline (VIBRAMYCIN) 100 MG capsule  2 times daily     04/03/19 6834           Bethann Berkshire, MD 04/03/19 [phone]

## 2019-04-04 LAB — GC/CHLAMYDIA PROBE AMP (~~LOC~~) NOT AT ARMC
Chlamydia: NEGATIVE
Neisseria Gonorrhea: NEGATIVE

## 2019-04-04 LAB — NOVEL CORONAVIRUS, NAA (HOSP ORDER, SEND-OUT TO REF LAB; TAT 18-24 HRS): SARS-CoV-2, NAA: NOT DETECTED

## 2019-04-04 LAB — RPR: RPR Ser Ql: NONREACTIVE

## 2019-06-22 ENCOUNTER — Encounter: Payer: Self-pay | Admitting: *Deleted

## 2019-09-24 ENCOUNTER — Encounter (HOSPITAL_COMMUNITY): Payer: Self-pay | Admitting: *Deleted

## 2019-09-24 ENCOUNTER — Ambulatory Visit (HOSPITAL_COMMUNITY)
Admission: EM | Admit: 2019-09-24 | Discharge: 2019-09-24 | Disposition: A | Discharge status: Home / Self Care | Payer: Self-pay | Attending: Emergency Medicine | Admitting: Emergency Medicine

## 2019-09-24 ENCOUNTER — Other Ambulatory Visit: Payer: Self-pay

## 2019-09-24 DIAGNOSIS — G8929 Other chronic pain: Secondary | ICD-10-CM | POA: Insufficient documentation

## 2019-09-24 DIAGNOSIS — N760 Acute vaginitis: Secondary | ICD-10-CM | POA: Insufficient documentation

## 2019-09-24 DIAGNOSIS — M25572 Pain in left ankle and joints of left foot: Secondary | ICD-10-CM | POA: Insufficient documentation

## 2019-09-24 HISTORY — DX: Gonococcal infection, unspecified: A54.9

## 2019-09-24 LAB — HIV ANTIBODY (ROUTINE TESTING W REFLEX): HIV Screen 4th Generation wRfx: NONREACTIVE

## 2019-09-24 MED ORDER — LIDOCAINE HCL (PF) 1 % IJ SOLN
INTRAMUSCULAR | Status: AC
  Filled 2019-09-24: qty 2

## 2019-09-24 MED ORDER — CEFTRIAXONE SODIUM 1 G IJ SOLR
INTRAMUSCULAR | Status: AC
  Filled 2019-09-24: qty 10

## 2019-09-24 MED ORDER — CEFTRIAXONE SODIUM 1 G IJ SOLR
1.0000 g | Freq: Once | INTRAMUSCULAR | Status: AC
  Administered 2019-09-24: 1 g via INTRAMUSCULAR

## 2019-09-24 NOTE — Discharge Instructions (Addendum)
Lap will return in a few days and you will be notified. Please contact physical therapy to help with ankle pain. Wear brace for comfort

## 2019-09-24 NOTE — ED Provider Notes (Signed)
[facility]    CSN: [id number] Arrival date & time: 09/24/19  1250      History   Chief Complaint Chief Complaint  Patient presents with  . Vaginal Discharge  . Ankle Pain    HPI Ana [name] is a 29 y.o. female.  She is presenting with left ankle pain. Persistent pain since Mar 08, 2019. She was unable to avoid PT . States since then she continues to have pain of left lateral ankle. Pain is worse with ambulation and standing.    Also reports vaginal discharge after unprotected sex. Discharge is malodorous. Symptoms similar to Gonorrhea and chlamydia. Associated with mild cramps.   HPI  Past Medical History:  Diagnosis Date  . Abnormal Pap smear   . Asthma   . Chlamydia Jan 2014  . Gonorrhea   . Kidney infection   . Vaginal Pap smear, abnormal     Patient Active Problem List   Diagnosis Date Noted  . PROM (premature rupture of membranes) 10/14/2016  . SVD (spontaneous vaginal delivery) 10/14/2016  . GBS (group B Streptococcus carrier), +RV culture, currently pregnant 10/12/2016  . Drug abuse, marijuana 10/05/2016  . History of preterm delivery, currently pregnant 05/21/2016  . Supervision of high risk pregnancy, antepartum, second trimester 04/21/2016  . Asthma, mild intermittent 05/22/2014    Past Surgical History:  Procedure Laterality Date  . WISDOM TOOTH EXTRACTION      OB History    Gravida  8   Para  6   Term  6   Preterm  0   AB  2   Living  6     SAB  2   TAB  0   Ectopic  0   Multiple  0   Live Births  6            Home Medications    Prior to Admission medications   Medication Sig Start Date End Date Taking? Authorizing Provider  doxycycline (VIBRAMYCIN) 100 MG capsule Take 1 capsule (100 mg total) by mouth 2 (two) times daily. One po bid x 7 days 04/03/19   Bethann Berkshire, MD  oxyCODONE-acetaminophen (PERCOCET/ROXICET) 5-325 MG tablet Take 1 tablet by mouth every 4 (four) hours as needed for severe pain.  03/08/19   Tegeler, Canary Brim, MD    Family History Family History  Problem Relation Age of Onset  . Hypertension Mother   . Hypertension Maternal Grandmother     Social History Social History   Tobacco Use  . Smoking status: Never Smoker  . Smokeless tobacco: Never Used  Vaping Use  . Vaping Use: Never used  Substance Use Topics  . Alcohol use: Yes    Comment: occasionally  . Drug use: Yes    Types: Marijuana     Allergies   Patient has no known allergies.   Review of Systems Review of Systems  Constitutional: Negative for appetite change and fever.  Genitourinary: Positive for vaginal discharge (and itching).  Musculoskeletal:       Ankle pain      Physical Exam Triage Vital Signs ED Triage Vitals [09/24/19 1259]  Enc Vitals Group     BP 125/80     Pulse Rate 71     Resp 16     Temp 98.6 F (37 C)     Temp Source Oral     SpO2 100 %     Weight      Height      Head Circumference  Peak Flow      Pain Score 6     Pain Loc      Pain Edu?      Excl. in GC?    No data found.  Updated Vital Signs BP 125/80   Pulse 71   Temp 98.6 F (37 C) (Oral)   Resp 16   LMP 09/06/2019 (Exact Date)   SpO2 100%   Breastfeeding No   Visual Acuity Right Eye Distance:   Left Eye Distance:   Bilateral Distance:    Right Eye Near:   Left Eye Near:    Bilateral Near:     Physical Exam Constitutional:      General: She is not in acute distress.    Appearance: She is normal weight.  HENT:     Head: Normocephalic.     Right Ear: External ear normal.     Left Ear: External ear normal.     Nose: Nose normal.  Eyes:     Conjunctiva/sclera: Conjunctivae normal.  Pulmonary:     Effort: Pulmonary effort is normal. No respiratory distress.  Abdominal:     Palpations: Abdomen is soft.  Musculoskeletal:     Cervical back: Normal range of motion.  Neurological:     Mental Status: She is alert.      UC Treatments / Results  Labs (all labs  ordered are listed, but only abnormal results are displayed) Labs Reviewed  RPR  HIV ANTIBODY (ROUTINE TESTING W REFLEX)  CERVICOVAGINAL ANCILLARY ONLY    EKG   Radiology No results found.  Procedures Procedures (including critical care time)  Medications Ordered in UC Medications  cefTRIAXone (ROCEPHIN) injection 1 g (has no administration in time range)    Initial Impression / Assessment and Plan / UC Course  I have reviewed the triage vital signs and the nursing notes.  Pertinent labs & imaging results that were available during my care of the patient were reviewed by me and considered in my medical decision making (see chart for details).     Vaginal discharge possible Gonorrhea. Sent labs away but patient reports it feels just like gonorrhea. Will provide with Rocephin injection in clinic today .  Also has ankle pain since Dec 2020. She would like to return to PT if possible to help with rehabilitation. Referral placed Final Clinical Impressions(s) / UC Diagnoses   Final diagnoses:  Acute vaginitis  Chronic pain of left ankle     Discharge Instructions     Lap will return in a few days and you will be notified. Please contact physical therapy to help with ankle pain. Wear brace for comfort    ED Prescriptions    None     PDMP not reviewed this encounter.   Chilton Si, PA-C 09/24/19 1401

## 2019-09-24 NOTE — ED Triage Notes (Addendum)
C/O vaginal discharge onset today.  Requesting STD testing.  Denies fevers.  C/O slight abd cramping.  Also c/o continued left ankle pain from injury in Dec 2020.  Pt ambulates without difficulty.

## 2019-09-25 LAB — CERVICOVAGINAL ANCILLARY ONLY
Bacterial Vaginitis (gardnerella): POSITIVE — AB
Candida Glabrata: NEGATIVE
Candida Vaginitis: POSITIVE — AB
Chlamydia: NEGATIVE
Comment: NEGATIVE
Comment: NEGATIVE
Comment: NEGATIVE
Comment: NEGATIVE
Comment: NEGATIVE
Comment: NORMAL
Neisseria Gonorrhea: NEGATIVE
Trichomonas: POSITIVE — AB

## 2019-09-25 LAB — RPR: RPR Ser Ql: NONREACTIVE

## 2019-09-26 ENCOUNTER — Telehealth (HOSPITAL_COMMUNITY): Payer: Self-pay | Admitting: Family Medicine

## 2019-09-26 MED ORDER — FLUCONAZOLE 150 MG PO TABS: 150.0000 mg | ORAL_TABLET | Freq: Every day | ORAL | 0 refills | Status: AC

## 2019-09-26 MED ORDER — METRONIDAZOLE 500 MG PO TABS: 500.0000 mg | ORAL_TABLET | Freq: Two times a day (BID) | ORAL | 0 refills | Status: DC

## 2019-09-26 NOTE — Telephone Encounter (Signed)
PT calls for STD results. PT made aware that she is positive for trich, BV, and yeast. PT is symptomatic with discharge, itching, and irritation. PT aware that treatment will be called in to her local [facility].

## 2019-11-13 ENCOUNTER — Emergency Department (HOSPITAL_COMMUNITY): Payer: Medicaid Other

## 2019-11-13 ENCOUNTER — Encounter (HOSPITAL_COMMUNITY): Payer: Self-pay

## 2019-11-13 ENCOUNTER — Emergency Department (HOSPITAL_COMMUNITY)
Admission: EM | Admit: 2019-11-13 | Discharge: 2019-11-13 | Disposition: A | Discharge status: Home / Self Care | Payer: Medicaid Other | Attending: Emergency Medicine | Admitting: Emergency Medicine

## 2019-11-13 ENCOUNTER — Other Ambulatory Visit: Payer: Self-pay

## 2019-11-13 DIAGNOSIS — Z5321 Procedure and treatment not carried out due to patient leaving prior to being seen by health care provider: Secondary | ICD-10-CM | POA: Insufficient documentation

## 2019-11-13 DIAGNOSIS — R079 Chest pain, unspecified: Secondary | ICD-10-CM | POA: Diagnosis not present

## 2019-11-13 DIAGNOSIS — R42 Dizziness and giddiness: Secondary | ICD-10-CM | POA: Diagnosis not present

## 2019-11-13 DIAGNOSIS — R109 Unspecified abdominal pain: Secondary | ICD-10-CM | POA: Diagnosis not present

## 2019-11-13 DIAGNOSIS — N939 Abnormal uterine and vaginal bleeding, unspecified: Secondary | ICD-10-CM | POA: Diagnosis present

## 2019-11-13 LAB — BASIC METABOLIC PANEL
Anion gap: 8 (ref 5–15)
BUN: 15 mg/dL (ref 6–20)
CO2: 24 mmol/L (ref 22–32)
Calcium: 8.9 mg/dL (ref 8.9–10.3)
Chloride: 105 mmol/L (ref 98–111)
Creatinine, Ser: 0.73 mg/dL (ref 0.44–1.00)
GFR calc Af Amer: >60 mL/min (ref >60)
GFR calc non Af Amer: >60 mL/min (ref >60)
Glucose, Bld: 92 mg/dL (ref 70–99)
Potassium: 4 mmol/L (ref 3.5–5.1)
Sodium: 137 mmol/L (ref 135–145)

## 2019-11-13 LAB — CBC
HCT: 36.6 % (ref 36.0–46.0)
Hemoglobin: 11.7 g/dL — ABNORMAL LOW (ref 12.0–15.0)
MCH: 29.1 pg (ref 26.0–34.0)
MCHC: 32 g/dL (ref 30.0–36.0)
MCV: 91 fL (ref 80.0–100.0)
Platelets: 210 10*3/uL (ref 150–400)
RBC: 4.02 MIL/uL (ref 3.87–5.11)
RDW: 12.8 % (ref 11.5–15.5)
WBC: 5 10*3/uL (ref 4.0–10.5)
nRBC: 0 % (ref 0.0–0.2)

## 2019-11-13 LAB — I-STAT BETA HCG BLOOD, ED (NOT ORDERABLE): I-stat hCG, quantitative: <5 m[IU]/mL (ref <5)

## 2019-11-13 LAB — TROPONIN I (HIGH SENSITIVITY): Troponin I (High Sensitivity): <2 ng/L (ref <18)

## 2019-11-13 NOTE — ED Triage Notes (Addendum)
Patient c/o more frequent periods than once a month and having heavy pariods with blood clots x 3 months. Patient also c/o feeling light headedness.  Patient also c/o left chest pain and abdominal pain since yesterday.

## 2019-11-13 NOTE — ED Notes (Signed)
Unable to recheck patient vitals she stated she did not want a gd vital sign recheck she want a f ing room.  Patient stated she been here for 3 hours and wanted a room.  Patient continue to curse after being asked several times to stop.

## 2019-11-13 NOTE — ED Notes (Signed)
Patient voiced that she is angry about having to wait for a room. Writer explained the wait times and no available beds. Patient was cussing at [employer] research [employer].

## 2019-11-13 NOTE — ED Notes (Signed)
Pt loud and yelling in lobby, she was rude to security and staff. Upon trying to talk to her she would not stop yelling and talking on phone to "Moma" She states she is bleeding in her pants and down her legs. Tried again to speak with her without success. She continues to yell, Off [employer] is present upon request to try and speak with her. At this time she is not showing any evidence of blood in the chair she was sitting in or visible on her clothes.

## 2019-11-13 NOTE — ED Notes (Signed)
Patient threw her name tags at Registration  Patient reported to security that she had vaginal blood on her shirt. Patient did not have blood when writer saw her earlier. Went to give patient blue scrub pants and shirt , peri pad , and mesh panties, but had left out of the ER. Other patients said the patient left.

## 2019-12-06 ENCOUNTER — Emergency Department (HOSPITAL_COMMUNITY)
Admission: EM | Admit: 2019-12-06 | Discharge: 2019-12-06 | Disposition: A | Discharge status: Home / Self Care | Payer: Medicaid Other | Attending: Emergency Medicine | Admitting: Emergency Medicine

## 2019-12-06 ENCOUNTER — Encounter (HOSPITAL_COMMUNITY): Payer: Self-pay

## 2019-12-06 ENCOUNTER — Other Ambulatory Visit: Payer: Self-pay

## 2019-12-06 DIAGNOSIS — J45909 Unspecified asthma, uncomplicated: Secondary | ICD-10-CM | POA: Diagnosis not present

## 2019-12-06 DIAGNOSIS — R509 Fever, unspecified: Secondary | ICD-10-CM

## 2019-12-06 DIAGNOSIS — J069 Acute upper respiratory infection, unspecified: Secondary | ICD-10-CM | POA: Diagnosis not present

## 2019-12-06 DIAGNOSIS — U071 COVID-19: Secondary | ICD-10-CM | POA: Insufficient documentation

## 2019-12-06 LAB — RESPIRATORY PANEL BY RT PCR (FLU A&B, COVID)
Influenza A by PCR: NEGATIVE
Influenza B by PCR: NEGATIVE
SARS Coronavirus 2 by RT PCR: POSITIVE — AB

## 2019-12-06 MED ORDER — ALBUTEROL SULFATE HFA 108 (90 BASE) MCG/ACT IN AERS
2.0000 | INHALATION_SPRAY | Freq: Once | RESPIRATORY_TRACT | Status: AC
  Administered 2019-12-06: 2 via RESPIRATORY_TRACT
  Filled 2019-12-06: qty 6.7

## 2019-12-06 MED ORDER — IBUPROFEN 200 MG PO TABS
600.0000 mg | ORAL_TABLET | Freq: Once | ORAL | Status: AC
  Administered 2019-12-06: 600 mg via ORAL
  Filled 2019-12-06: qty 3

## 2019-12-06 MED ORDER — ACETAMINOPHEN 325 MG PO TABS
650.0000 mg | ORAL_TABLET | Freq: Once | ORAL | Status: AC
  Administered 2019-12-06: 650 mg via ORAL
  Filled 2019-12-06: qty 2

## 2019-12-06 NOTE — ED Provider Notes (Signed)
[facility]-EMERGENCY DEPT Provider Note   CSN: [id number] Arrival date & time: 12/06/19  7893     History Chief Complaint  Patient presents with  . Fever    Ana [name] is a 29 y.o. female.  The history is provided by the patient.  URI Presenting symptoms: congestion and fever   Presenting symptoms: no cough, no ear pain and no sore throat   Severity:  Mild Onset quality:  Sudden Duration:  1 day Timing:  Intermittent Progression:  Waxing and waning Chronicity:  New Relieved by:  OTC medications Worsened by:  Nothing Associated symptoms: arthralgias, headaches and myalgias   Risk factors: sick contacts (niece with covid)        Past Medical History:  Diagnosis Date  . Abnormal Pap smear   . Asthma   . Chlamydia Jan 2014  . Gonorrhea   . Kidney infection   . Vaginal Pap smear, abnormal     Patient Active Problem List   Diagnosis Date Noted  . PROM (premature rupture of membranes) 10/14/2016  . SVD (spontaneous vaginal delivery) 10/14/2016  . GBS (group B Streptococcus carrier), +RV culture, currently pregnant 10/12/2016  . Drug abuse, marijuana 10/05/2016  . History of preterm delivery, currently pregnant 05/21/2016  . Supervision of high risk pregnancy, antepartum, second trimester 04/21/2016  . Asthma, mild intermittent 05/22/2014    Past Surgical History:  Procedure Laterality Date  . WISDOM TOOTH EXTRACTION       OB History    Gravida  8   Para  6   Term  6   Preterm  0   AB  2   Living  6     SAB  2   TAB  0   Ectopic  0   Multiple  0   Live Births  6           Family History  Problem Relation Age of Onset  . Hypertension Mother   . Hypertension Maternal Grandmother     Social History   Tobacco Use  . Smoking status: Never Smoker  . Smokeless tobacco: Never Used  Vaping Use  . Vaping Use: Never used  Substance Use Topics  . Alcohol use: Yes    Comment: occasionally  . Drug use: Yes     Types: Marijuana    Home Medications Prior to Admission medications   Medication Sig Start Date End Date Taking? Authorizing Provider  doxycycline (VIBRAMYCIN) 100 MG capsule Take 1 capsule (100 mg total) by mouth 2 (two) times daily. One po bid x 7 days 04/03/19   Bethann Berkshire, MD  metroNIDAZOLE (FLAGYL) 500 MG tablet Take 1 tablet (500 mg total) by mouth 2 (two) times daily. 09/26/19   Dahlia Byes A, NP  oxyCODONE-acetaminophen (PERCOCET/ROXICET) 5-325 MG tablet Take 1 tablet by mouth every 4 (four) hours as needed for severe pain. 03/08/19   Tegeler, Canary Brim, MD    Allergies    Patient has no known allergies.  Review of Systems   Review of Systems  Constitutional: Positive for fever. Negative for chills.  HENT: Positive for congestion. Negative for ear pain and sore throat.   Eyes: Negative for pain and visual disturbance.  Respiratory: Negative for cough and shortness of breath.   Cardiovascular: Negative for chest pain and palpitations.  Gastrointestinal: Negative for abdominal pain and vomiting.  Genitourinary: Negative for dysuria and hematuria.  Musculoskeletal: Positive for arthralgias and myalgias. Negative for back pain.  Skin: Negative for color  change and rash.  Neurological: Positive for headaches. Negative for seizures and syncope.  All other systems reviewed and are negative.   Physical Exam Updated Vital Signs BP (!) 151/93 (BP Location: Left Arm)   Pulse 93   Temp (!) 100.9 F (38.3 C) (Oral)   Resp 18   LMP 11/13/2019   SpO2 100%   Physical Exam Vitals and nursing note reviewed.  Constitutional:      General: She is not in acute distress.    Appearance: She is well-developed.  HENT:     Head: Normocephalic and atraumatic.  Eyes:     Conjunctiva/sclera: Conjunctivae normal.     Pupils: Pupils are equal, round, and reactive to light.  Cardiovascular:     Rate and Rhythm: Normal rate and regular rhythm.     Pulses: Normal pulses.     Heart  sounds: No murmur heard.   Pulmonary:     Effort: Pulmonary effort is normal. No respiratory distress.     Breath sounds: Normal breath sounds.  Abdominal:     Palpations: Abdomen is soft.     Tenderness: There is no abdominal tenderness.  Musculoskeletal:     Cervical back: Neck supple.  Skin:    General: Skin is warm and dry.     Capillary Refill: Capillary refill takes less than 2 seconds.  Neurological:     Mental Status: She is alert.     ED Results / Procedures / Treatments   Labs (all labs ordered are listed, but only abnormal results are displayed) Labs Reviewed  RESPIRATORY PANEL BY RT PCR (FLU A&B, COVID)    EKG None  Radiology No results found.  Procedures Procedures (including critical care time)  Medications Ordered in ED Medications  albuterol (VENTOLIN HFA) 108 (90 Base) MCG/ACT inhaler 2 puff (has no administration in time range)  acetaminophen (TYLENOL) tablet 650 mg (650 mg Oral Given 12/06/19 0759)    ED Course  I have reviewed the triage vital signs and the nursing notes.  Pertinent labs & imaging results that were available during my care of the patient were reviewed by me and considered in my medical decision making (see chart for details).    MDM Rules/Calculators/A&P                          Ana [name] is a 29 year old female with history of asthma who presents to the ED after Covid exposure, fever.  Fever upon arrival here but otherwise normal vitals.  Normal room air oxygenation both at rest and with ambulation.  Overall appears well.  Having body aches, headaches, chills.  Given Tylenol.  Given albuterol inhaler to use as needed as she states she does not have any more.  Understands return precautions and Covid precautions.  We will give her information for Covid clinic.  Discharged in good condition.  Understands return precautions.  This chart was dictated using voice recognition software.  Despite best efforts to proofread,  errors can  occur which can change the documentation meaning.  Ana [name] was evaluated in Emergency Department on 12/06/2019 for the symptoms described in the history of present illness. She was evaluated in the context of the global COVID-19 pandemic, which necessitated consideration that the patient might be at risk for infection with the SARS-CoV-2 virus that causes COVID-19. Institutional protocols and algorithms that pertain to the evaluation of patients at risk for COVID-19 are in a state of rapid change based on  information released by regulatory bodies including the CDC and federal and state organizations. These policies and algorithms were followed during the patient's care in the ED.  Final Clinical Impression(s) / ED Diagnoses Final diagnoses:  Febrile illness  Upper respiratory tract infection, unspecified type    Rx / DC Orders ED Discharge Orders    None       Virgina Norfolk, DO 12/06/19 0801

## 2019-12-06 NOTE — ED Triage Notes (Signed)
Pt reports her niece tested covid positive.  C/O generalized body aches, fever, and headache.   A/Ox4 Denies n/v or trouble breathing

## 2019-12-07 ENCOUNTER — Other Ambulatory Visit: Payer: Self-pay | Admitting: Nurse Practitioner

## 2019-12-07 DIAGNOSIS — U071 COVID-19: Secondary | ICD-10-CM

## 2019-12-07 NOTE — Progress Notes (Signed)
I connected by phone with Ana [name] on 12/07/2019 at 11:54 AM to discuss the potential use of an new treatment for mild to moderate COVID-19 viral infection in non-hospitalized patients.  This patient is a 29 y.o. female that meets the FDA criteria for Emergency Use Authorization of casirivimab\imdevimab.  Has a (+) direct SARS-CoV-2 viral test result  Has mild or moderate COVID-19   Is ? 30 years of age and weighs ? 40 kg  Is NOT hospitalized due to COVID-19  Is NOT requiring oxygen therapy or requiring an increase in baseline oxygen flow rate due to COVID-19  Is within 10 days of symptom onset  Has at least one of the high risk factor(s) for progression to severe COVID-19 and/or hospitalization as defined in EUA.  Specific high risk criteria : Other high risk medical condition per CDC:  Social Vulnerability   Onset 9/29. Unvaccinated.    I have spoken and communicated the following to the patient or parent/caregiver:  1. FDA has authorized the emergency use of casirivimab\imdevimab for the treatment of mild to moderate COVID-19 in adults and pediatric patients with positive results of direct SARS-CoV-2 viral testing who are 50 years of age and older weighing at least 40 kg, and who are at high risk for progressing to severe COVID-19 and/or hospitalization.  2. The significant known and potential risks and benefits of casirivimab\imdevimab, and the extent to which such potential risks and benefits are unknown.  3. Information on available alternative treatments and the risks and benefits of those alternatives, including clinical trials.  4. Patients treated with casirivimab\imdevimab should continue to self-isolate and use infection control measures (e.g., wear mask, isolate, social distance, avoid sharing personal items, clean and disinfect "high touch" surfaces, and frequent handwashing) according to CDC guidelines.   5. The patient or parent/caregiver has the option to accept  or refuse casirivimab\imdevimab .  After reviewing this information with the patient, the patient has agreed to receive one of the available covid 19 monoclonal antibodies and will be provided an appropriate fact sheet prior to infusion.[name], DNP, AGNP-C [phone] (Infusion Center Hotline)

## 2019-12-08 ENCOUNTER — Ambulatory Visit (HOSPITAL_COMMUNITY): Payer: Medicaid Other

## 2019-12-12 ENCOUNTER — Emergency Department (HOSPITAL_COMMUNITY)
Admission: EM | Admit: 2019-12-12 | Discharge: 2019-12-12 | Disposition: A | Discharge status: Home / Self Care | Payer: Medicaid Other | Attending: Emergency Medicine | Admitting: Emergency Medicine

## 2019-12-12 ENCOUNTER — Encounter (HOSPITAL_COMMUNITY): Payer: Self-pay | Admitting: Emergency Medicine

## 2019-12-12 ENCOUNTER — Other Ambulatory Visit: Payer: Self-pay

## 2019-12-12 DIAGNOSIS — U071 COVID-19: Secondary | ICD-10-CM | POA: Insufficient documentation

## 2019-12-12 DIAGNOSIS — Z5321 Procedure and treatment not carried out due to patient leaving prior to being seen by health care provider: Secondary | ICD-10-CM | POA: Diagnosis not present

## 2019-12-12 NOTE — ED Notes (Signed)
Pt reported to staff that her and her family were leaving and coming back in the morning.

## 2019-12-12 NOTE — ED Triage Notes (Signed)
Per pt, states she tested positive for covid on 9/29-states her job is requiring her to be tested again-no symptoms-just here for test for work-patient has 6 kids to be tested as well because they never were, no symptoms

## 2019-12-13 ENCOUNTER — Emergency Department (HOSPITAL_COMMUNITY)
Admission: EM | Admit: 2019-12-13 | Discharge: 2019-12-13 | Discharge status: Against Medical Advice | Payer: Medicaid Other | Attending: Emergency Medicine | Admitting: Emergency Medicine

## 2019-12-13 ENCOUNTER — Encounter (HOSPITAL_COMMUNITY): Payer: Self-pay

## 2019-12-13 DIAGNOSIS — U071 COVID-19: Secondary | ICD-10-CM | POA: Insufficient documentation

## 2019-12-13 DIAGNOSIS — F419 Anxiety disorder, unspecified: Secondary | ICD-10-CM | POA: Diagnosis not present

## 2019-12-13 DIAGNOSIS — R451 Restlessness and agitation: Secondary | ICD-10-CM | POA: Insufficient documentation

## 2019-12-13 DIAGNOSIS — J4520 Mild intermittent asthma, uncomplicated: Secondary | ICD-10-CM | POA: Insufficient documentation

## 2019-12-13 DIAGNOSIS — R456 Violent behavior: Secondary | ICD-10-CM | POA: Diagnosis not present

## 2019-12-13 NOTE — ED Notes (Signed)
Pt becoming very loud and aggressive. Staff attempting to speak with pt in calm manor with no improvement. Pt cont yelling at staff, repeatedly back coming into hallway. This RN and other staff asking pt to remain in room due to COVID status. Pt non compliant. Pt cont with becoming louder, stating "no one is listening". This RN attempted to speak with pt again regarding her concerns, pt cont yelling at this RN.

## 2019-12-13 NOTE — ED Triage Notes (Signed)
Pt arrived via walk in, denies any issue. COVID (+) sept 29th. States she needs COVID test for work. Denies any sx, offers no complaints.

## 2019-12-13 NOTE — ED Notes (Signed)
Pt left AMA from this facility.  Pt did not wish to speak to any other medical staff at this time. Pt ambulatory with steady gait, clear speech, steady gait.

## 2019-12-13 NOTE — ED Provider Notes (Signed)
[facility]-EMERGENCY DEPT Provider Note   CSN: [id number] Arrival date & time: 12/13/19  4010     History Chief Complaint  Patient presents with  . COVID testing    Ana [name] is a 29 y.o. female.  29 year old female who tested positive for COVID-19 7 days ago who presents requesting a Covid test for clearance to go back to work. Patient states that she has not had symptoms now for 2 days. Upon further questioning, patient became very belligerent and aggressive. Patient then became uncooperative with further questioning        Past Medical History:  Diagnosis Date  . Abnormal Pap smear   . Asthma   . Chlamydia Jan 2014  . Gonorrhea   . Kidney infection   . Vaginal Pap smear, abnormal     Patient Active Problem List   Diagnosis Date Noted  . PROM (premature rupture of membranes) 10/14/2016  . SVD (spontaneous vaginal delivery) 10/14/2016  . GBS (group B Streptococcus carrier), +RV culture, currently pregnant 10/12/2016  . Drug abuse, marijuana 10/05/2016  . History of preterm delivery, currently pregnant 05/21/2016  . Supervision of high risk pregnancy, antepartum, second trimester 04/21/2016  . Asthma, mild intermittent 05/22/2014    Past Surgical History:  Procedure Laterality Date  . WISDOM TOOTH EXTRACTION       OB History    Gravida  8   Para  6   Term  6   Preterm  0   AB  2   Living  6     SAB  2   TAB  0   Ectopic  0   Multiple  0   Live Births  6           Family History  Problem Relation Age of Onset  . Hypertension Mother   . Hypertension Maternal Grandmother     Social History   Tobacco Use  . Smoking status: Never Smoker  . Smokeless tobacco: Never Used  Vaping Use  . Vaping Use: Never used  Substance Use Topics  . Alcohol use: Yes    Comment: occasionally  . Drug use: Yes    Types: Marijuana    Home Medications Prior to Admission medications   Medication Sig Start Date End Date  Taking? Authorizing Provider  doxycycline (VIBRAMYCIN) 100 MG capsule Take 1 capsule (100 mg total) by mouth 2 (two) times daily. One po bid x 7 days 04/03/19   Bethann Berkshire, MD  metroNIDAZOLE (FLAGYL) 500 MG tablet Take 1 tablet (500 mg total) by mouth 2 (two) times daily. 09/26/19   Dahlia Byes A, NP  oxyCODONE-acetaminophen (PERCOCET/ROXICET) 5-325 MG tablet Take 1 tablet by mouth every 4 (four) hours as needed for severe pain. 03/08/19   Tegeler, Canary Brim, MD    Allergies    Patient has no known allergies.  Review of Systems   Review of Systems  Unable to perform ROS: Other    Physical Exam Updated Vital Signs BP (!) 131/97 (BP Location: Left Arm)   Pulse 63   Temp 98.7 F (37.1 C) (Oral)   Resp 18   Ht 1.626 m (5\' 4" )   Wt 77.1 kg   LMP 11/13/2019   SpO2 93%   BMI 29.18 kg/m   Physical Exam Vitals and nursing note reviewed.  Constitutional:      General: She is not in acute distress.    Appearance: Normal appearance. She is well-developed. She is not toxic-appearing.  HENT:  Head: Normocephalic and atraumatic.  Eyes:     General: Lids are normal.     Conjunctiva/sclera: Conjunctivae normal.     Pupils: Pupils are equal, round, and reactive to light.  Neck:     Thyroid: No thyroid mass.     Trachea: No tracheal deviation.  Cardiovascular:     Rate and Rhythm: Normal rate and regular rhythm.     Heart sounds: Normal heart sounds. No murmur heard.  No gallop.   Pulmonary:     Effort: Pulmonary effort is normal. No respiratory distress.     Breath sounds: Normal breath sounds. No stridor. No decreased breath sounds, wheezing, rhonchi or rales.  Abdominal:     General: Bowel sounds are normal. There is no distension.     Palpations: Abdomen is soft.     Tenderness: There is no abdominal tenderness. There is no rebound.  Musculoskeletal:        General: No tenderness. Normal range of motion.     Cervical back: Normal range of motion and neck supple.    Skin:    General: Skin is warm and dry.     Findings: No abrasion or rash.  Neurological:     Mental Status: She is alert and oriented to person, place, and time.     GCS: GCS eye subscore is 4. GCS verbal subscore is 5. GCS motor subscore is 6.     Cranial Nerves: No cranial nerve deficit.     Sensory: No sensory deficit.  Psychiatric:        Attention and Perception: She is inattentive.        Mood and Affect: Mood is anxious. Affect is labile, angry and inappropriate.        Speech: Speech is rapid and pressured.        Behavior: Behavior is agitated, aggressive, hyperactive and combative.     ED Results / Procedures / Treatments   Labs (all labs ordered are listed, but only abnormal results are displayed) Labs Reviewed - No data to display  EKG None  Radiology No results found.  Procedures Procedures (including critical care time)  Medications Ordered in ED Medications - No data to display  ED Course  I have reviewed the triage vital signs and the nursing notes.  Pertinent labs & imaging results that were available during my care of the patient were reviewed by me and considered in my medical decision making (see chart for details).    MDM Rules/Calculators/A&P                          Attempted multiple times to speak with patient concerning CDC guidelines. About Covid testing. Patient became loud and boisterous. Was very verbally abusive. Spoke with patient's father who was in a different room and requested that he speak with his daughter so that she would allow me to explained he Covid testing guideline as well as quarantine rules to her. Patient requested to speak to someone for ministration. Patient spoke to charge nurse and was not cooperative with that individual. I elicit the help of care and rash as well as our patient experience team who will speak with patient. Is at this point requesting to not be seen by me. I attempted to examine her other children but  she did not want that. Patient will be leaving his medical advice Final Clinical Impression(s) / ED Diagnoses Final diagnoses:  None    Rx / DC  Orders ED Discharge Orders    None       Lorre Nick, MD 12/13/19 1024

## 2019-12-18 ENCOUNTER — Other Ambulatory Visit: Payer: Medicaid Other

## 2020-04-12 ENCOUNTER — Ambulatory Visit: Payer: Medicaid Other | Admitting: Medical

## 2020-07-30 ENCOUNTER — Ambulatory Visit: Payer: Medicaid Other | Admitting: Family Medicine

## 2020-07-30 ENCOUNTER — Telehealth: Payer: Self-pay | Admitting: *Deleted

## 2020-07-30 NOTE — Telephone Encounter (Addendum)
Patients walks in for appt today.  She was on the phone and was asked by Greer Ee to hold briefly while she finished canceling a prior appointment right before the patient came in.  For whatever reason this upset the patient and she became belligerent, loud and rude and was screaming at [facility] in main waiting room.  I intervened and asked the patient to step back to the hallway.  I asked her to lower her voice and explain to me what was going on.  Pt continues to yell that we are interrogating her, she has been waiting for this appt for 2 weeks, her sons appt is late, we are rude, are we going to see her etc in no manner in which I could decipher the issus.  At this point she is still screaming at me to the point that Dr. Leveda Anna heard it behind the employee area door. He also attempted to calm her down and figure out the issue to no avail.  At this point she grabbed her son by the arm, slammed the door open and walked backup to [facility] and said "Check me in right now ( insert explicatives) and I mean now".  Dr. Leveda Anna and I advised that she would not be seen today and she walks out yelling and cussing.   She then returns to checkin approximately 3 minutes later.  Dr. Leveda Anna and I again advised that she would not be seen today and since this was her new patient appointment, we would no longer take her as a patient.  She again starts to yell in the waiting room, using many explicatives.  Before she left she threatened Vea saying "I will find you outside, just wait" and walked out the door.  During this interaction security and the [employer] had been called by different employees.  After speaking with Vea and [employer] there are no further actions taken.  Due to the threatening of one of our staff, we will NOT see her as a [facility].  Jone Baseman, CMA

## 2020-07-30 NOTE — Telephone Encounter (Signed)
I agree with this account of events.  Ana [name] remained calm throughout (as did the full [facility] staff).  The patient had ample opportunities to calm down and modulate her behavior.  She did not (likely could not) self regulate.  It was only when this became apparent did I inform her that we would not check her in and that she would not be seen by [place] today.  I also informed her security had been called.  She left out the front door with her young child (I cannot recall if the child was a boy or girl).  I do not know how long she lingered in the parking lot.    I agree with and fully support the decision to not see her or her children/other family members in this practice.  I am not fully clear what caused her anger in the first place.  She directed her anger to our check in process and kept repeating that she felt disrespected.  I am truly sorry that was her experience.

## 2020-08-06 ENCOUNTER — Encounter (HOSPITAL_COMMUNITY): Payer: Self-pay | Admitting: *Deleted

## 2020-08-06 ENCOUNTER — Inpatient Hospital Stay (HOSPITAL_COMMUNITY)
Admission: AD | Admit: 2020-08-06 | Discharge: 2020-08-06 | Disposition: A | Discharge status: Home / Self Care | Payer: Medicaid Other | Attending: Obstetrics and Gynecology | Admitting: Obstetrics and Gynecology

## 2020-08-06 ENCOUNTER — Inpatient Hospital Stay (HOSPITAL_COMMUNITY): Payer: Medicaid Other

## 2020-08-06 DIAGNOSIS — O99511 Diseases of the respiratory system complicating pregnancy, first trimester: Secondary | ICD-10-CM | POA: Insufficient documentation

## 2020-08-06 DIAGNOSIS — O99891 Other specified diseases and conditions complicating pregnancy: Secondary | ICD-10-CM

## 2020-08-06 DIAGNOSIS — R109 Unspecified abdominal pain: Secondary | ICD-10-CM | POA: Diagnosis not present

## 2020-08-06 DIAGNOSIS — Z3A01 Less than 8 weeks gestation of pregnancy: Secondary | ICD-10-CM | POA: Insufficient documentation

## 2020-08-06 DIAGNOSIS — B9689 Other specified bacterial agents as the cause of diseases classified elsewhere: Secondary | ICD-10-CM | POA: Insufficient documentation

## 2020-08-06 DIAGNOSIS — O23591 Infection of other part of genital tract in pregnancy, first trimester: Secondary | ICD-10-CM | POA: Diagnosis not present

## 2020-08-06 DIAGNOSIS — O26891 Other specified pregnancy related conditions, first trimester: Secondary | ICD-10-CM

## 2020-08-06 DIAGNOSIS — O3680X0 Pregnancy with inconclusive fetal viability, not applicable or unspecified: Secondary | ICD-10-CM | POA: Insufficient documentation

## 2020-08-06 DIAGNOSIS — J45909 Unspecified asthma, uncomplicated: Secondary | ICD-10-CM | POA: Diagnosis not present

## 2020-08-06 DIAGNOSIS — N760 Acute vaginitis: Secondary | ICD-10-CM

## 2020-08-06 LAB — URINALYSIS, ROUTINE W REFLEX MICROSCOPIC
Bilirubin Urine: NEGATIVE
Glucose, UA: NEGATIVE mg/dL
Hgb urine dipstick: NEGATIVE
Ketones, ur: NEGATIVE mg/dL
Leukocytes,Ua: NEGATIVE
Nitrite: NEGATIVE
Protein, ur: NEGATIVE mg/dL
Specific Gravity, Urine: 1.03 (ref 1.005–1.030)
pH: 6 (ref 5.0–8.0)

## 2020-08-06 LAB — CBC
HCT: 37.3 % (ref 36.0–46.0)
Hemoglobin: 12.1 g/dL (ref 12.0–15.0)
MCH: 29.3 pg (ref 26.0–34.0)
MCHC: 32.4 g/dL (ref 30.0–36.0)
MCV: 90.3 fL (ref 80.0–100.0)
Platelets: 250 10*3/uL (ref 150–400)
RBC: 4.13 MIL/uL (ref 3.87–5.11)
RDW: 12.9 % (ref 11.5–15.5)
WBC: 10.1 10*3/uL (ref 4.0–10.5)
nRBC: 0 % (ref 0.0–0.2)

## 2020-08-06 LAB — WET PREP, GENITAL
Trich, Wet Prep: NONE SEEN
Yeast Wet Prep HPF POC: NONE SEEN

## 2020-08-06 LAB — HCG, QUANTITATIVE, PREGNANCY: hCG, Beta Chain, Quant, S: 10908 m[IU]/mL — ABNORMAL HIGH (ref <5)

## 2020-08-06 LAB — POCT PREGNANCY, URINE: Preg Test, Ur: POSITIVE — AB

## 2020-08-06 MED ORDER — ONDANSETRON 4 MG PO TBDP: 4.0000 mg | ORAL_TABLET | Freq: Three times a day (TID) | ORAL | 1 refills | Status: DC | PRN

## 2020-08-06 MED ORDER — PROMETHAZINE HCL 25 MG PO TABS: 25.0000 mg | ORAL_TABLET | Freq: Four times a day (QID) | ORAL | 0 refills | Status: DC | PRN

## 2020-08-06 MED ORDER — FAMOTIDINE 20 MG PO TABS: 20.0000 mg | ORAL_TABLET | Freq: Two times a day (BID) | ORAL | 0 refills | Status: DC

## 2020-08-06 MED ORDER — METRONIDAZOLE 500 MG PO TABS: 500.0000 mg | ORAL_TABLET | Freq: Two times a day (BID) | ORAL | 0 refills | Status: DC

## 2020-08-06 MED ORDER — PROMETHAZINE HCL 25 MG/ML IJ SOLN
25.0000 mg | Freq: Once | INTRAMUSCULAR | Status: AC
  Administered 2020-08-06: 25 mg via INTRAMUSCULAR
  Filled 2020-08-06: qty 1

## 2020-08-06 NOTE — MAU Provider Note (Signed)
History     CSN: [id number]  Arrival date and time: 08/06/20 2029   Event Date/Time   First Provider Initiated Contact with Patient 08/06/20 2128      Chief Complaint  Patient presents with  . Nausea  . Vaginal Bleeding   HPI Ana [name] is a 30 y.o. [id number] at [redacted]w[redacted]d who presents with abdominal pain. She reports the pain has been ongoing for 2 days. She is concerned she has "a hole in her placenta." She denies any vaginal bleeding or discharge. She also reports constant nausea but no vomiting.   OB History    Gravida  10   Para  6   Term  6   Preterm  0   AB  3   Living  6     SAB  2   IAB  1   Ectopic  0   Multiple  0   Live Births  6           Past Medical History:  Diagnosis Date  . Abnormal Pap smear   . Asthma   . Chlamydia Jan 2014  . Gonorrhea   . Kidney infection   . Vaginal Pap smear, abnormal     Past Surgical History:  Procedure Laterality Date  . DILATION AND CURETTAGE OF UTERUS    . WISDOM TOOTH EXTRACTION      Family History  Problem Relation Age of Onset  . Hypertension Mother   . Hypertension Maternal Grandmother     Social History   Tobacco Use  . Smoking status: Never Smoker  . Smokeless tobacco: Never Used  Vaping Use  . Vaping Use: Never used  Substance Use Topics  . Alcohol use: Not Currently    Comment: occasionally  . Drug use: Yes    Types: Marijuana    Comment: daily    Allergies: No Known Allergies  Medications Prior to Admission  Medication Sig Dispense Refill Last Dose  . Prenatal Vit-Fe Fumarate-FA (PRENATAL MULTIVITAMIN) TABS tablet Take 1 tablet by mouth daily at 12 noon.     Marland Kitchen doxycycline (VIBRAMYCIN) 100 MG capsule Take 1 capsule (100 mg total) by mouth 2 (two) times daily. One po bid x 7 days 14 capsule 0   . metroNIDAZOLE (FLAGYL) 500 MG tablet Take 1 tablet (500 mg total) by mouth 2 (two) times daily. 14 tablet 0   . oxyCODONE-acetaminophen (PERCOCET/ROXICET) 5-325 MG tablet Take 1 tablet  by mouth every 4 (four) hours as needed for severe pain. 15 tablet 0     Review of Systems  Constitutional: Negative.  Negative for fatigue and fever.  HENT: Negative.   Respiratory: Negative.  Negative for shortness of breath.   Cardiovascular: Negative.  Negative for chest pain.  Gastrointestinal: Positive for abdominal pain and nausea. Negative for constipation, diarrhea and vomiting.  Genitourinary: Negative.  Negative for dysuria, vaginal bleeding and vaginal discharge.  Neurological: Negative.  Negative for dizziness and headaches.   Physical Exam   Blood pressure (!) 130/59, pulse 66, temperature 98.2 F (36.8 C), resp. rate 18, height 5\' 4"  (1.626 m), weight 86.2 kg, last menstrual period 06/29/2020.  Physical Exam Vitals and nursing note reviewed.  Constitutional:      General: She is not in acute distress.    Appearance: She is well-developed.  HENT:     Head: Normocephalic.  Eyes:     Pupils: Pupils are equal, round, and reactive to light.  Cardiovascular:     Rate and Rhythm: Normal  rate and regular rhythm.     Heart sounds: Normal heart sounds.  Pulmonary:     Effort: Pulmonary effort is normal. No respiratory distress.     Breath sounds: Normal breath sounds.  Abdominal:     General: Bowel sounds are normal. There is no distension.     Palpations: Abdomen is soft.     Tenderness: There is no abdominal tenderness.  Skin:    General: Skin is warm and dry.  Neurological:     Mental Status: She is alert and oriented to person, place, and time.  Psychiatric:        Behavior: Behavior normal.        Thought Content: Thought content normal.        Judgment: Judgment normal.     MAU Course  Procedures Results for orders placed or performed during the hospital encounter of 08/06/20 (from the past 24 hour(s))  Pregnancy, urine POC     Status: Abnormal   Collection Time: 08/06/20  9:12 PM  Result Value Ref Range   Preg Test, Ur POSITIVE (A) NEGATIVE   Urinalysis, Routine w reflex microscopic Urine, Clean Catch     Status: Abnormal   Collection Time: 08/06/20  9:29 PM  Result Value Ref Range   Color, Urine YELLOW YELLOW   APPearance HAZY (A) CLEAR   Specific Gravity, Urine 1.030 1.005 - 1.030   pH 6.0 5.0 - 8.0   Glucose, UA NEGATIVE NEGATIVE mg/dL   Hgb urine dipstick NEGATIVE NEGATIVE   Bilirubin Urine NEGATIVE NEGATIVE   Ketones, ur NEGATIVE NEGATIVE mg/dL   Protein, ur NEGATIVE NEGATIVE mg/dL   Nitrite NEGATIVE NEGATIVE   Leukocytes,Ua NEGATIVE NEGATIVE  Wet prep, genital     Status: Abnormal   Collection Time: 08/06/20  9:42 PM   Specimen: PATH Cytology Cervicovaginal Ancillary Only  Result Value Ref Range   Yeast Wet Prep HPF POC NONE SEEN NONE SEEN   Trich, Wet Prep NONE SEEN NONE SEEN   Clue Cells Wet Prep HPF POC PRESENT (A) NONE SEEN   WBC, Wet Prep HPF POC MANY (A) NONE SEEN   Sperm PRESENT   CBC     Status: None   Collection Time: 08/06/20 10:00 PM  Result Value Ref Range   WBC 10.1 4.0 - 10.5 K/uL   RBC 4.13 3.87 - 5.11 MIL/uL   Hemoglobin 12.1 12.0 - 15.0 g/dL   HCT 51.7 00.1 - 74.9 %   MCV 90.3 80.0 - 100.0 fL   MCH 29.3 26.0 - 34.0 pg   MCHC 32.4 30.0 - 36.0 g/dL   RDW 44.9 67.5 - 91.6 %   Platelets 250 150 - 400 K/uL   nRBC 0.0 0.0 - 0.2 %  hCG, quantitative, pregnancy     Status: Abnormal   Collection Time: 08/06/20 10:00 PM  Result Value Ref Range   hCG, Beta Chain, Quant, S 10,908 (H) <5 mIU/mL   US OB LESS THAN 14 WEEKS WITH OB TRANSVAGINAL  Result Date: 08/06/2020 CLINICAL DATA:  30 year old pregnant female with abdominal cramps. LMP: 06/29/2020 corresponding to an estimated gestational age of [redacted] weeks, 3 days. EXAM: OBSTETRIC <14 WK [place] AND TRANSVAGINAL OB US TECHNIQUE: Both transabdominal and transvaginal ultrasound examinations were performed for complete evaluation of the gestation as well as the maternal uterus, adnexal regions, and pelvic cul-de-sac. Transvaginal technique was performed to  assess early pregnancy. COMPARISON:  None. FINDINGS: The uterus is anteverted and appears unremarkable. A sac-like structure is noted in the  upper endometrium with apparent surrounding decidual reaction. No fetal pole or yolk sac identified within this sac. This may represent an early gestational sac, a blighted ovum, or a pseudo gestation of an ectopic pregnancy. Clinical correlation and follow-up with serial HCG and repeat ultrasound in 7-11 days, or earlier if clinically indicated, recommended. If this cystic structure is a true gestational sac, the estimated gestational age based on mean sac diameter of 8 mm is 5 weeks, 4 days. The ovaries are unremarkable. There is a small free fluid in the pelvis. IMPRESSION: Sac-like structure within the endometrium as above. Clinical correlation and follow-up recommended. Electronically Signed   By: Elgie Collard M.D.   On: 08/06/2020 22:35   MDM UA, UPT CBC, HCG ABO/Rh- O Pos Wet prep and gc/chlamydia US OB Comp Less 14 weeks with Transvaginal  Consulted with Dr. Vergie Living due to elevated HCG and empty gestational sac- recommends repeat ultrasound in 1 week  Discussed with client the diagnosis of pregnancy of unknown anatomic location.  Three possibilities of outcome are: a healthy pregnancy that is too early to see a yolk sac to confirm the pregnancy is in the uterus, a pregnancy that is not healthy and has not developed and will not develop, and an ectopic pregnancy that is in the abdomen that cannot be identified at this time.  And ectopic pregnancy can be a life threatening situation as a pregnancy needs to be in the uterus which is a muscle and can stretch to accommodate the growth of a pregnancy.  Other structures in the pelvis and abdomen as not muscular and do not stretch with the growth of a pregnancy.  Worst case scenario is that a structure ruptures with a growing pregnancy not in the uterus and and internal hemorrhage can be a life threatening  situation.  We need to follow the progression of this pregnancy carefully.  We need to check another serum pregnancy hormone level to determine if the levels are rising appropriately  and to determine the next steps that are needed for you. Patient's questions were answered.  Assessment and Plan   1. Pregnancy of unknown anatomic location   2. Abdominal pain affecting pregnancy   3. [redacted] weeks gestation of pregnancy   4. Bacterial vaginosis    -Discharge home in stable condition -Rx for metronidazole, phenergan, zofran sent to patient's pharmacy -Strict ectopic precautions discussed -Patient advised to follow-up with OB in 1 week for repeat ultrasound, order placed -Patient may return to MAU as needed or if her condition were to change or worsen   Rolm Bookbinder CNM 08/06/2020, 9:28 PM

## 2020-08-06 NOTE — MAU Note (Signed)
Pt reports she has had nausea and cramping x 2 days.( no vomiting. )Denies any vag bleeding or discharge. Had positive HPT.

## 2020-08-06 NOTE — Discharge Instructions (Signed)

## 2020-08-07 LAB — GC/CHLAMYDIA PROBE AMP (~~LOC~~) NOT AT ARMC
Chlamydia: NEGATIVE
Comment: NEGATIVE
Comment: NORMAL
Neisseria Gonorrhea: NEGATIVE

## 2020-08-13 ENCOUNTER — Ambulatory Visit
Admission: RE | Admit: 2020-08-13 | Discharge: 2020-08-13 | Disposition: A | Discharge status: Home / Self Care | Payer: Medicaid Other | Source: Ambulatory Visit

## 2020-08-13 ENCOUNTER — Other Ambulatory Visit: Payer: Self-pay

## 2020-08-13 ENCOUNTER — Ambulatory Visit: Admission: RE | Admit: 2020-08-13 | Payer: Medicaid Other | Source: Ambulatory Visit

## 2020-08-13 DIAGNOSIS — Z3A01 Less than 8 weeks gestation of pregnancy: Secondary | ICD-10-CM

## 2020-08-13 DIAGNOSIS — B9689 Other specified bacterial agents as the cause of diseases classified elsewhere: Secondary | ICD-10-CM

## 2020-08-13 DIAGNOSIS — O3680X0 Pregnancy with inconclusive fetal viability, not applicable or unspecified: Secondary | ICD-10-CM

## 2020-08-14 ENCOUNTER — Telehealth: Payer: Self-pay

## 2020-08-14 NOTE — Telephone Encounter (Signed)
Mar/patient called in very upset (yelling) after leaving her 8am appt and having to wait to be called for 7 minutes. stated she had her son's graduation-patient rescheduled per request for later same day 08/13/20 arriving at 145p. Patient did not arrive for this appt.

## 2020-08-26 ENCOUNTER — Other Ambulatory Visit: Payer: Self-pay

## 2020-08-26 ENCOUNTER — Ambulatory Visit
Admission: RE | Admit: 2020-08-26 | Discharge: 2020-08-26 | Disposition: A | Discharge status: Home / Self Care | Payer: Medicaid Other | Source: Ambulatory Visit

## 2020-08-26 ENCOUNTER — Telehealth: Payer: Self-pay | Admitting: Medical

## 2020-08-26 DIAGNOSIS — B9689 Other specified bacterial agents as the cause of diseases classified elsewhere: Secondary | ICD-10-CM | POA: Insufficient documentation

## 2020-08-26 DIAGNOSIS — N760 Acute vaginitis: Secondary | ICD-10-CM | POA: Diagnosis present

## 2020-08-26 DIAGNOSIS — O3680X0 Pregnancy with inconclusive fetal viability, not applicable or unspecified: Secondary | ICD-10-CM

## 2020-08-26 DIAGNOSIS — Z3A01 Less than 8 weeks gestation of pregnancy: Secondary | ICD-10-CM | POA: Diagnosis present

## 2020-08-26 NOTE — Telephone Encounter (Signed)
I called Ana [name] today at 3:48 PM and confirmed patient's identity using two patient identifiers. [place] results from earlier today were reviewed. Patient is scheduled for new OB visit at [facility] on 09/05/20. First trimester warning signs reviewed. Patient voiced understanding and had no further questions.   US OB Transvaginal  Result Date: 08/26/2020 CLINICAL DATA:  First trimester pregnancy with inconclusive fetal viability. EXAM: TRANSVAGINAL OB ULTRASOUND TECHNIQUE: Transvaginal ultrasound was performed for complete evaluation of the gestation as well as the maternal uterus, adnexal regions, and pelvic cul-de-sac. COMPARISON:  08/06/2020 FINDINGS: Intrauterine gestational sac: Single Yolk sac:  Visualized. Embryo:  Visualized. Cardiac Activity: Visualized. Heart Rate: 159 bpm CRL:   17 mm   8 w 1 d                  [place] EDC: 04/06/2021 Subchorionic hemorrhage:  None visualized. Maternal uterus/adnexae: Both ovaries are normal in appearance no masses are identified. Tiny amount of free fluid noted in cul-de-sac. IMPRESSION: Single living IUP with estimated gestational age of [redacted] weeks 1 day, and [place] EDC of 04/06/2021. Electronically Signed   By: Danae Orleans M.D.   On: 08/26/2020 08:47    Marny Lowenstein, PA-C 08/26/2020 3:48 PM

## 2020-08-27 ENCOUNTER — Encounter (HOSPITAL_COMMUNITY): Payer: Self-pay | Admitting: Obstetrics & Gynecology

## 2020-08-27 ENCOUNTER — Inpatient Hospital Stay (HOSPITAL_COMMUNITY)
Admission: AD | Admit: 2020-08-27 | Discharge: 2020-08-27 | Disposition: A | Discharge status: Home / Self Care | Payer: Medicaid Other | Attending: Obstetrics & Gynecology | Admitting: Obstetrics & Gynecology

## 2020-08-27 ENCOUNTER — Other Ambulatory Visit: Payer: Self-pay

## 2020-08-27 DIAGNOSIS — O98511 Other viral diseases complicating pregnancy, first trimester: Secondary | ICD-10-CM | POA: Diagnosis not present

## 2020-08-27 DIAGNOSIS — Z3A08 8 weeks gestation of pregnancy: Secondary | ICD-10-CM | POA: Diagnosis not present

## 2020-08-27 DIAGNOSIS — U071 COVID-19: Secondary | ICD-10-CM | POA: Insufficient documentation

## 2020-08-27 LAB — CBC WITH DIFFERENTIAL/PLATELET
Abs Immature Granulocytes: 0.03 10*3/uL (ref 0.00–0.07)
Basophils Absolute: 0 10*3/uL (ref 0.0–0.1)
Basophils Relative: 1 %
Eosinophils Absolute: 0.1 10*3/uL (ref 0.0–0.5)
Eosinophils Relative: 1 %
HCT: 35.6 % — ABNORMAL LOW (ref 36.0–46.0)
Hemoglobin: 11.7 g/dL — ABNORMAL LOW (ref 12.0–15.0)
Immature Granulocytes: 1 %
Lymphocytes Relative: 6 %
Lymphs Abs: 0.4 10*3/uL — ABNORMAL LOW (ref 0.7–4.0)
MCH: 29 pg (ref 26.0–34.0)
MCHC: 32.9 g/dL (ref 30.0–36.0)
MCV: 88.1 fL (ref 80.0–100.0)
Monocytes Absolute: 0.5 10*3/uL (ref 0.1–1.0)
Monocytes Relative: 8 %
Neutro Abs: 5.3 10*3/uL (ref 1.7–7.7)
Neutrophils Relative %: 83 %
Platelets: 204 10*3/uL (ref 150–400)
RBC: 4.04 MIL/uL (ref 3.87–5.11)
RDW: 12.5 % (ref 11.5–15.5)
WBC: 6.3 10*3/uL (ref 4.0–10.5)
nRBC: 0 % (ref 0.0–0.2)

## 2020-08-27 LAB — URINALYSIS, ROUTINE W REFLEX MICROSCOPIC
Bilirubin Urine: NEGATIVE
Glucose, UA: NEGATIVE mg/dL
Hgb urine dipstick: NEGATIVE
Ketones, ur: NEGATIVE mg/dL
Leukocytes,Ua: NEGATIVE
Nitrite: NEGATIVE
Protein, ur: NEGATIVE mg/dL
Specific Gravity, Urine: 1.015 (ref 1.005–1.030)
pH: 6 (ref 5.0–8.0)

## 2020-08-27 LAB — COMPREHENSIVE METABOLIC PANEL
ALT: 14 U/L (ref 0–44)
AST: 17 U/L (ref 15–41)
Albumin: 3.3 g/dL — ABNORMAL LOW (ref 3.5–5.0)
Alkaline Phosphatase: 41 U/L (ref 38–126)
Anion gap: 14 (ref 5–15)
BUN: 8 mg/dL (ref 6–20)
CO2: 21 mmol/L — ABNORMAL LOW (ref 22–32)
Calcium: 9 mg/dL (ref 8.9–10.3)
Chloride: 101 mmol/L (ref 98–111)
Creatinine, Ser: 0.72 mg/dL (ref 0.44–1.00)
GFR, Estimated: >60 mL/min (ref >60)
Glucose, Bld: 96 mg/dL (ref 70–99)
Potassium: 3.5 mmol/L (ref 3.5–5.1)
Sodium: 136 mmol/L (ref 135–145)
Total Bilirubin: 0.6 mg/dL (ref 0.3–1.2)
Total Protein: 6.1 g/dL — ABNORMAL LOW (ref 6.5–8.1)

## 2020-08-27 LAB — RESP PANEL BY RT-PCR (FLU A&B, COVID) ARPGX2
Influenza A by PCR: NEGATIVE
Influenza B by PCR: NEGATIVE
SARS Coronavirus 2 by RT PCR: POSITIVE — AB

## 2020-08-27 MED ORDER — DIPHENHYDRAMINE HCL 50 MG/ML IJ SOLN
12.5000 mg | Freq: Once | INTRAMUSCULAR | Status: AC
  Administered 2020-08-27: 12.5 mg via INTRAVENOUS
  Filled 2020-08-27: qty 1

## 2020-08-27 MED ORDER — LACTATED RINGERS IV SOLN: INTRAVENOUS | Status: DC

## 2020-08-27 MED ORDER — ACETAMINOPHEN 10 MG/ML IV SOLN
1000.0000 mg | Freq: Once | INTRAVENOUS | Status: AC
  Administered 2020-08-27: 1000 mg via INTRAVENOUS
  Filled 2020-08-27: qty 100

## 2020-08-27 MED ORDER — ONDANSETRON HCL 4 MG/2ML IJ SOLN
4.0000 mg | Freq: Once | INTRAMUSCULAR | Status: AC
  Administered 2020-08-27: 4 mg via INTRAVENOUS
  Filled 2020-08-27: qty 2

## 2020-08-27 MED ORDER — DEXAMETHASONE SODIUM PHOSPHATE 10 MG/ML IJ SOLN
10.0000 mg | Freq: Once | INTRAMUSCULAR | Status: AC
  Administered 2020-08-27: 10 mg via INTRAVENOUS
  Filled 2020-08-27: qty 1

## 2020-08-27 MED ORDER — LACTATED RINGERS IV BOLUS
1000.0000 mL | Freq: Once | INTRAVENOUS | Status: AC
  Administered 2020-08-27: 1000 mL via INTRAVENOUS

## 2020-08-27 MED ORDER — SODIUM CHLORIDE 0.9 % IV SOLN
25.0000 mg | Freq: Once | INTRAVENOUS | Status: AC
  Administered 2020-08-27: 25 mg via INTRAVENOUS
  Filled 2020-08-27: qty 1

## 2020-08-27 NOTE — Discharge Instructions (Signed)

## 2020-08-27 NOTE — MAU Provider Note (Addendum)
Chief Complaint:  Headache and Emesis   Event Date/Time   First Provider Initiated Contact with Patient 08/27/20 [phone]     HPI: Ana [name] is a 30 y.o. [id number] at [redacted]w[redacted]d who presents to maternity admissions reporting nausea/vomiting, severe headache (7/10 pain), mild cough and hot/cold chills. Headache began last night, she took phenergan and Tylenol and went to bed at 9pm. Woke up several times throughout the night to vomit. No vaginal bleeding or discharge.  Pregnancy Course: Attempted to start care at [facility] but was unable to due to an incident at her first appt, likely to start at [facility].  Past Medical History:  Diagnosis Date   Abnormal Pap smear    Asthma    Chlamydia Jan 2014   Gonorrhea    Kidney infection    Vaginal Pap smear, abnormal    OB History  Gravida Para Term Preterm AB Living  10 6 6  0 3 6  SAB IAB Ectopic Multiple Live Births  2 1 0 0 6    # Outcome Date GA Lbr Len/2nd Weight Sex Delivery Anes PTL Lv  10 Current           9 Term 10/14/16 [redacted]w[redacted]d 09:30 / 00:17 6 lb 10.2 oz (3.01 kg) M Vag-Spont EPI  LIV  8 SAB 2017          7 Term 10/18/14 [redacted]w[redacted]d 01:57 / 00:35 5 lb 9.4 oz (2.535 kg) M Vag-Spont EPI  LIV     Birth Comments: WNL  6 SAB 08/29/12 [redacted]w[redacted]d 112:20 / 00:03 6 oz (0.17 kg) M Vag-Spont None  FD  5 Term 05/13/10   6 lb (2.722 kg) F Vag-Spont   LIV  4 Term 06/11/08   6 lb (2.722 kg) F Vag-Spont   LIV  3 Term 08/21/06   6 lb 14 oz (3.118 kg) M Vag-Spont   LIV  2 Term 03/30/05   7 lb (3.175 kg) F Vag-Spont   LIV  1 IAB            Past Surgical History:  Procedure Laterality Date   DILATION AND CURETTAGE OF UTERUS     WISDOM TOOTH EXTRACTION     Family History  Problem Relation Age of Onset   Hypertension Mother    Hypertension Maternal Grandmother    Social History   Tobacco Use   Smoking status: Never   Smokeless tobacco: Never  Vaping Use   Vaping Use: Never used  Substance Use Topics   Alcohol use: Not Currently    Comment: occasionally   Drug  use: Yes    Types: Marijuana    Comment: daily   No Known Allergies Medications Prior to Admission  Medication Sig Dispense Refill Last Dose   Prenatal Vit-Fe Fumarate-FA (PRENATAL MULTIVITAMIN) TABS tablet Take 1 tablet by mouth daily at 12 noon.   08/26/2020   promethazine (PHENERGAN) 25 MG tablet Take 1 tablet (25 mg total) by mouth every 6 (six) hours as needed for nausea or vomiting. 30 tablet 0 08/26/2020   famotidine (PEPCID) 20 MG tablet Take 1 tablet (20 mg total) by mouth 2 (two) times daily. 30 tablet 0    metroNIDAZOLE (FLAGYL) 500 MG tablet Take 1 tablet (500 mg total) by mouth 2 (two) times daily. 14 tablet 0    ondansetron (ZOFRAN ODT) 4 MG disintegrating tablet Take 1 tablet (4 mg total) by mouth every 8 (eight) hours as needed for nausea or vomiting. 30 tablet 1     I  have reviewed patient's Past Medical Hx, Surgical Hx, Family Hx, Social Hx, medications and allergies.   ROS:  Pertinent items noted in HPI and remainder of comprehensive ROS otherwise negative.  Physical Exam  Patient Vitals for the past 24 hrs:  BP Temp Pulse Resp  08/27/20 0526 139/80 98.8 F (37.1 C) 89 18   Constitutional: Well-developed, well-nourished female in mild distress.  Cardiovascular: normal rate & rhythm, no murmur Respiratory: normal effort, lung sounds clear throughout GI: Abd soft, non-tender, pos BS x 4 MS: Extremities nontender, no edema, normal ROM Neurologic: Alert and oriented x 4.  GU: no CVA tenderness Pelvic exam deferred  Labs: Results for orders placed or performed during the hospital encounter of 08/27/20 (from the past 24 hour(s))  CBC with Differential/Platelet     Status: Abnormal   Collection Time: 08/27/20  6:14 AM  Result Value Ref Range   WBC 6.3 4.0 - 10.5 K/uL   RBC 4.04 3.87 - 5.11 MIL/uL   Hemoglobin 11.7 (L) 12.0 - 15.0 g/dL   HCT 17.5 (L) 10.2 - 58.5 %   MCV 88.1 80.0 - 100.0 fL   MCH 29.0 26.0 - 34.0 pg   MCHC 32.9 30.0 - 36.0 g/dL   RDW 27.7 82.4 -  23.5 %   Platelets 204 150 - 400 K/uL   nRBC 0.0 0.0 - 0.2 %   Neutrophils Relative % 83 %   Neutro Abs 5.3 1.7 - 7.7 K/uL   Lymphocytes Relative 6 %   Lymphs Abs 0.4 (L) 0.7 - 4.0 K/uL   Monocytes Relative 8 %   Monocytes Absolute 0.5 0.1 - 1.0 K/uL   Eosinophils Relative 1 %   Eosinophils Absolute 0.1 0.0 - 0.5 K/uL   Basophils Relative 1 %   Basophils Absolute 0.0 0.0 - 0.1 K/uL   Immature Granulocytes 1 %   Abs Immature Granulocytes 0.03 0.00 - 0.07 K/uL  Comprehensive metabolic panel     Status: Abnormal   Collection Time: 08/27/20  6:14 AM  Result Value Ref Range   Sodium 136 135 - 145 mmol/L   Potassium 3.5 3.5 - 5.1 mmol/L   Chloride 101 98 - 111 mmol/L   CO2 21 (L) 22 - 32 mmol/L   Glucose, Bld 96 70 - 99 mg/dL   BUN 8 6 - 20 mg/dL   Creatinine, Ser 3.61 0.44 - 1.00 mg/dL   Calcium 9.0 8.9 - 44.3 mg/dL   Total Protein 6.1 (L) 6.5 - 8.1 g/dL   Albumin 3.3 (L) 3.5 - 5.0 g/dL   AST 17 15 - 41 U/L   ALT 14 0 - 44 U/L   Alkaline Phosphatase 41 38 - 126 U/L   Total Bilirubin 0.6 0.3 - 1.2 mg/dL   GFR, Estimated >15 >40 mL/min   Anion gap 14 5 - 15    Imaging:  US OB Transvaginal  Result Date: 08/26/2020 CLINICAL DATA:  First trimester pregnancy with inconclusive fetal viability. EXAM: TRANSVAGINAL OB ULTRASOUND TECHNIQUE: Transvaginal ultrasound was performed for complete evaluation of the gestation as well as the maternal uterus, adnexal regions, and pelvic cul-de-sac. COMPARISON:  08/06/2020 FINDINGS: Intrauterine gestational sac: Single Yolk sac:  Visualized. Embryo:  Visualized. Cardiac Activity: Visualized. Heart Rate: 159 bpm CRL:   17 mm   8 w 1 d                  [place] EDC: 04/06/2021 Subchorionic hemorrhage:  None visualized. Maternal uterus/adnexae: Both ovaries are normal in appearance  no masses are identified. Tiny amount of free fluid noted in cul-de-sac. IMPRESSION: Single living IUP with estimated gestational age of [redacted] weeks 1 day, and [place] EDC of 04/06/2021.  Electronically Signed   By: Danae Orleans M.D.   On: 08/26/2020 08:47    MAU Course: Orders Placed This Encounter  Procedures   Resp Panel by RT-PCR (Flu A&B, Covid) Nasopharyngeal Swab   Urinalysis, Routine w reflex microscopic Urine, Clean Catch   CBC with Differential/Platelet   Comprehensive metabolic panel   Airborne and Contact precautions   Meds ordered this encounter  Medications   lactated ringers bolus 1,000 mL   ondansetron (ZOFRAN) injection 4 mg   acetaminophen (OFIRMEV) IV 1,000 mg    Order Specific Question:   Is the patient UNABLE to take oral / enteral medications?    Answer:   Yes    Order Specific Question:   Does the patient have a contraindication to NSAID use? (If YES, specify on line 3)    Answer:   Yes    Order Specific Question:   NSAID contraindication:    Answer:   pregnancy    Order Specific Question:   Answers to IV acetaminophen criteria above:    Answer:   "Yes" to all criteria.   promethazine (PHENERGAN) 25 mg in sodium chloride 0.9 % 50 mL IVPB   lactated ringers infusion   dexamethasone (DECADRON) injection 10 mg   diphenhydrAMINE (BENADRYL) injection 12.5 mg   MDM: LR bolus + IV zofran and IV Tylenol with good but not complete relief of her headache. Covid swab and bloodwork obtained Headache and nausea unrelieved so IV phenergan, decadron and benadryl ordered  Care turned over to Clayton Bibles, [place] at [phone]. Edd Arbour, CNM, MSN, IBCLC  Assessment: --COVID +, no acute symptoms --Patient sleeping after HA cocktail  Plan: --Discharge home in stable condition with quarantine precautions  F/U: --Patient has New OB intake appointment with [facility] on 09/05/2020   Clayton Bibles, MSN, CNM Certified Nurse Midwife, [facility] for [employer], [facility] 08/27/20 11:19 AM

## 2020-08-27 NOTE — MAU Note (Signed)
Pt reports she went to bed at 9 tonight because she had a headache. Took tylenol and promethazine.. Woke up several times and vomited. Her head really hurts now and she has hot and cold chills.

## 2020-09-03 ENCOUNTER — Encounter (HOSPITAL_COMMUNITY): Payer: Self-pay | Admitting: Obstetrics & Gynecology

## 2020-09-03 ENCOUNTER — Other Ambulatory Visit: Payer: Self-pay

## 2020-09-03 ENCOUNTER — Inpatient Hospital Stay (HOSPITAL_COMMUNITY)
Admission: AD | Admit: 2020-09-03 | Discharge: 2020-09-03 | Disposition: A | Discharge status: Home / Self Care | Payer: Medicaid Other | Attending: Obstetrics & Gynecology | Admitting: Obstetrics & Gynecology

## 2020-09-03 DIAGNOSIS — U071 COVID-19: Secondary | ICD-10-CM

## 2020-09-03 DIAGNOSIS — O98511 Other viral diseases complicating pregnancy, first trimester: Secondary | ICD-10-CM | POA: Diagnosis not present

## 2020-09-03 DIAGNOSIS — Z3A09 9 weeks gestation of pregnancy: Secondary | ICD-10-CM

## 2020-09-03 DIAGNOSIS — R197 Diarrhea, unspecified: Secondary | ICD-10-CM | POA: Insufficient documentation

## 2020-09-03 DIAGNOSIS — R109 Unspecified abdominal pain: Secondary | ICD-10-CM | POA: Diagnosis not present

## 2020-09-03 DIAGNOSIS — Z79899 Other long term (current) drug therapy: Secondary | ICD-10-CM | POA: Diagnosis not present

## 2020-09-03 DIAGNOSIS — O26891 Other specified pregnancy related conditions, first trimester: Secondary | ICD-10-CM | POA: Diagnosis present

## 2020-09-03 DIAGNOSIS — O219 Vomiting of pregnancy, unspecified: Secondary | ICD-10-CM | POA: Insufficient documentation

## 2020-09-03 DIAGNOSIS — Z8616 Personal history of COVID-19: Secondary | ICD-10-CM | POA: Insufficient documentation

## 2020-09-03 LAB — URINALYSIS, ROUTINE W REFLEX MICROSCOPIC
Bilirubin Urine: NEGATIVE
Glucose, UA: NEGATIVE mg/dL
Hgb urine dipstick: NEGATIVE
Ketones, ur: 5 mg/dL — AB
Leukocytes,Ua: NEGATIVE
Nitrite: NEGATIVE
Protein, ur: NEGATIVE mg/dL
Specific Gravity, Urine: 1.025 (ref 1.005–1.030)
pH: 6 (ref 5.0–8.0)

## 2020-09-03 MED ORDER — FAMOTIDINE 20 MG PO TABS: 20.0000 mg | ORAL_TABLET | Freq: Two times a day (BID) | ORAL | 1 refills | Status: DC

## 2020-09-03 MED ORDER — ONDANSETRON HCL 4 MG/2ML IJ SOLN
4.0000 mg | Freq: Once | INTRAMUSCULAR | Status: AC
  Administered 2020-09-03: 4 mg via INTRAVENOUS
  Filled 2020-09-03: qty 2

## 2020-09-03 MED ORDER — METOCLOPRAMIDE HCL 10 MG PO TABS: 10.0000 mg | ORAL_TABLET | Freq: Four times a day (QID) | ORAL | 2 refills | Status: DC | PRN

## 2020-09-03 MED ORDER — LACTATED RINGERS IV BOLUS
1000.0000 mL | Freq: Once | INTRAVENOUS | Status: AC
  Administered 2020-09-03: 1000 mL via INTRAVENOUS

## 2020-09-03 NOTE — Discharge Instructions (Signed)

## 2020-09-03 NOTE — MAU Provider Note (Signed)
History     CSN: [id number]  Arrival date and time: 09/03/20 1322   Event Date/Time   First Provider Initiated Contact with Patient 09/03/20 1433      Chief Complaint  Patient presents with   Nausea   Emesis   Abdominal Pain   Diarrhea   HPI  Patient is a Ana [name] at [redacted]w[redacted]d who presents for nausea, vomiting, diarrhea. Patient was diagnosed with Covid on 6/21. Reports ongoing nausea, vomiting, fatigue. Has been sleeping all day and wants to feel better. Reports that she was taking her prescribed benadryl/phenergan and zofran but that she hates taking pills and also they made her sleepy. Wants to try something else. No fevers. Does endorse cramping and body aches. No shortness of breath. No cough. Says that she had Covid in September and it wasn't like this, does not think this is covid. Denies vaginal bleeding.  OB History     Gravida  10   Para  6   Term  6   Preterm  0   AB  3   Living  6      SAB  2   IAB  1   Ectopic  0   Multiple  0   Live Births  6           Past Medical History:  Diagnosis Date   Abnormal Pap smear    Asthma    Chlamydia Jan 2014   Gonorrhea    Kidney infection    Vaginal Pap smear, abnormal     Past Surgical History:  Procedure Laterality Date   DILATION AND CURETTAGE OF UTERUS     WISDOM TOOTH EXTRACTION      Family History  Problem Relation Age of Onset   Hypertension Mother    Hypertension Maternal Grandmother     Social History   Tobacco Use   Smoking status: Never   Smokeless tobacco: Never  Vaping Use   Vaping Use: Never used  Substance Use Topics   Alcohol use: Not Currently    Comment: occasionally   Drug use: Not Currently    Types: Marijuana    Comment: daily    Allergies: No Known Allergies  Medications Prior to Admission  Medication Sig Dispense Refill Last Dose   ondansetron (ZOFRAN ODT) 4 MG disintegrating tablet Take 1 tablet (4 mg total) by mouth every 8 (eight) hours as needed for  nausea or vomiting. 30 tablet 1 09/02/2020   promethazine (PHENERGAN) 25 MG tablet Take 1 tablet (25 mg total) by mouth every 6 (six) hours as needed for nausea or vomiting. 30 tablet 0 Past Week   Prenatal Vit-Fe Fumarate-FA (PRENATAL MULTIVITAMIN) TABS tablet Take 1 tablet by mouth daily at 12 noon.      [DISCONTINUED] famotidine (PEPCID) 20 MG tablet Take 1 tablet (20 mg total) by mouth 2 (two) times daily. 30 tablet 0     Review of Systems  Constitutional:  Positive for chills and fatigue. Negative for diaphoresis and fever.  Respiratory:  Negative for cough and shortness of breath.   Cardiovascular:  Negative for chest pain.  Gastrointestinal:  Positive for abdominal pain, diarrhea, nausea and vomiting.  Musculoskeletal:  Negative for back pain.  Skin:  Negative for rash.  Neurological:  Positive for headaches. Negative for dizziness.  Physical Exam   Blood pressure (!) 119/57, pulse 61, temperature 98.3 F (36.8 C), temperature source Oral, resp. rate 12, height 5\' 4"  (1.626 m), weight 84.3 kg, last menstrual period 06/29/2020,  SpO2 99 %.  Physical Exam Vitals and nursing note reviewed.  Cardiovascular:     Rate and Rhythm: Normal rate and regular rhythm.  Pulmonary:     Effort: Pulmonary effort is normal.  Abdominal:     General: Bowel sounds are normal. There is no distension.     Palpations: Abdomen is soft.  Psychiatric:        Mood and Affect: Mood normal.        Behavior: Behavior normal.    MAU Course  Procedures  MDM Pt evaluated at bedside, VSS UA w 5 ketones, neg nitrites, neg leukocytes  1L LR IV, 4mg  IV Zofran Patient asking to leave     Assessment and Plan   Covid 19 in First Trimester of Pregnancy -encouraged to continue rest, drinking fluids, nausea medications/tylenol prn. Patient frustrated that she has to take pills, also does not want to take anything that is a pill, and also does not want to take anything that makes her sleepy. Explained limited  options that are not in pill form and that also don't make sleepy. Will give script for reglan, counseled on nonpharmacologic options and importance of rest and hydration.  -stable for dc home    09/03/2020, 4:57 PM

## 2020-09-03 NOTE — MAU Note (Signed)
Ana [name] is a 30 y.o. at [redacted]w[redacted]d here in MAU reporting: ongoing nausea and vomiting for the past few days. Also having diarrhea. Emesis x 3 today, unable to count how many episodes of diarrhea. Having some cramping.   Onset of complaint: ongoing  Pain score: 7/10  Lab orders placed from triage: ua

## 2020-09-05 ENCOUNTER — Ambulatory Visit: Payer: Medicaid Other

## 2020-09-05 NOTE — Progress Notes (Signed)
Called Pt to start My Chart visit,no answer at first, then she called stating that she was driving but she was not going to keep baby & that she already has 6 children. Then the line disconnected & I could not reach her again.This encounter was created in error - please disregard.

## 2020-09-17 ENCOUNTER — Encounter: Payer: Medicaid Other | Admitting: Student

## 2021-01-22 ENCOUNTER — Other Ambulatory Visit: Payer: Self-pay

## 2021-01-22 ENCOUNTER — Encounter (HOSPITAL_BASED_OUTPATIENT_CLINIC_OR_DEPARTMENT_OTHER): Payer: Self-pay | Admitting: Emergency Medicine

## 2021-01-22 ENCOUNTER — Emergency Department (HOSPITAL_BASED_OUTPATIENT_CLINIC_OR_DEPARTMENT_OTHER)
Admission: EM | Admit: 2021-01-22 | Discharge: 2021-01-22 | Disposition: A | Discharge status: Home / Self Care | Payer: Medicaid Other | Attending: Emergency Medicine | Admitting: Emergency Medicine

## 2021-01-22 DIAGNOSIS — Z3490 Encounter for supervision of normal pregnancy, unspecified, unspecified trimester: Secondary | ICD-10-CM

## 2021-01-22 DIAGNOSIS — J45909 Unspecified asthma, uncomplicated: Secondary | ICD-10-CM | POA: Insufficient documentation

## 2021-01-22 DIAGNOSIS — Z3A00 Weeks of gestation of pregnancy not specified: Secondary | ICD-10-CM | POA: Diagnosis not present

## 2021-01-22 DIAGNOSIS — R059 Cough, unspecified: Secondary | ICD-10-CM | POA: Insufficient documentation

## 2021-01-22 DIAGNOSIS — O26891 Other specified pregnancy related conditions, first trimester: Secondary | ICD-10-CM | POA: Insufficient documentation

## 2021-01-22 DIAGNOSIS — J111 Influenza due to unidentified influenza virus with other respiratory manifestations: Secondary | ICD-10-CM

## 2021-01-22 DIAGNOSIS — Z20822 Contact with and (suspected) exposure to covid-19: Secondary | ICD-10-CM | POA: Insufficient documentation

## 2021-01-22 LAB — RESP PANEL BY RT-PCR (FLU A&B, COVID) ARPGX2
Influenza A by PCR: POSITIVE — AB
Influenza B by PCR: NEGATIVE
SARS Coronavirus 2 by RT PCR: NEGATIVE

## 2021-01-22 LAB — PREGNANCY, URINE: Preg Test, Ur: POSITIVE — AB

## 2021-01-22 NOTE — ED Provider Notes (Signed)
[facility] EMERGENCY DEPT Provider Note   CSN: [id number] Arrival date & time: 01/22/21  0865     History Chief Complaint  Patient presents with   Cough    Ana [name] is a 30 y.o. female.  30 year old female presents with URI symptoms x24 hours.  Has been medicating at home with over-the-counter medications.  Denies any vomiting or diarrhea.  Has had mild cephalgia associated with this.  Denies any urinary symptoms.  No abdominal pain      Past Medical History:  Diagnosis Date   Abnormal Pap smear    Asthma    Chlamydia Jan 2014   Gonorrhea    Kidney infection    Vaginal Pap smear, abnormal     Patient Active Problem List   Diagnosis Date Noted   PROM (premature rupture of membranes) 10/14/2016   SVD (spontaneous vaginal delivery) 10/14/2016   GBS (group B Streptococcus carrier), +RV culture, currently pregnant 10/12/2016   Drug abuse, marijuana 10/05/2016   History of preterm delivery, currently pregnant 05/21/2016   Supervision of high risk pregnancy, antepartum, second trimester 04/21/2016   Asthma, mild intermittent 05/22/2014    Past Surgical History:  Procedure Laterality Date   DILATION AND CURETTAGE OF UTERUS     WISDOM TOOTH EXTRACTION       OB History     Gravida  10   Para  6   Term  6   Preterm  0   AB  3   Living  6      SAB  2   IAB  1   Ectopic  0   Multiple  0   Live Births  6           Family History  Problem Relation Age of Onset   Hypertension Mother    Hypertension Maternal Grandmother     Social History   Tobacco Use   Smoking status: Never   Smokeless tobacco: Never  Vaping Use   Vaping Use: Never used  Substance Use Topics   Alcohol use: Not Currently    Comment: occasionally   Drug use: Not Currently    Types: Marijuana    Comment: daily    Home Medications Prior to Admission medications   Medication Sig Start Date End Date Taking? Authorizing Provider  famotidine (PEPCID)  20 MG tablet Take 1 tablet (20 mg total) by mouth 2 (two) times daily. 09/03/20   Gita Kudo, MD  metoCLOPramide (REGLAN) 10 MG tablet Take 1 tablet (10 mg total) by mouth 4 (four) times daily as needed for nausea or vomiting. 09/03/20   Gita Kudo, MD  ondansetron (ZOFRAN ODT) 4 MG disintegrating tablet Take 1 tablet (4 mg total) by mouth every 8 (eight) hours as needed for nausea or vomiting. 08/06/20   Rolm Bookbinder, CNM  Prenatal Vit-Fe Fumarate-FA (PRENATAL MULTIVITAMIN) TABS tablet Take 1 tablet by mouth daily at 12 noon.    [provider]  promethazine (PHENERGAN) 25 MG tablet Take 1 tablet (25 mg total) by mouth every 6 (six) hours as needed for nausea or vomiting. 08/06/20   Rolm Bookbinder, CNM    Allergies    Patient has no known allergies.  Review of Systems   Review of Systems  All other systems reviewed and are negative.  Physical Exam Updated Vital Signs BP (!) 149/93 (BP Location: Right Arm)   Pulse 96   Temp 99.5 F (37.5 C) (Oral)   Resp 16  Ht 1.626 m (5\' 4" )   Wt 83.9 kg   LMP 12/15/2020 (Approximate)   SpO2 100%   Breastfeeding Unknown   BMI 31.76 kg/m   Physical Exam Vitals and nursing note reviewed.  Constitutional:      General: She is not in acute distress.    Appearance: Normal appearance. She is well-developed. She is not toxic-appearing.  HENT:     Head: Normocephalic and atraumatic.  Eyes:     General: Lids are normal.     Conjunctiva/sclera: Conjunctivae normal.     Pupils: Pupils are equal, round, and reactive to light.  Neck:     Thyroid: No thyroid mass.     Trachea: No tracheal deviation.  Cardiovascular:     Rate and Rhythm: Normal rate and regular rhythm.     Heart sounds: Normal heart sounds. No murmur heard.   No gallop.  Pulmonary:     Effort: Pulmonary effort is normal. No respiratory distress.     Breath sounds: Normal breath sounds. No stridor. No decreased breath sounds, wheezing, rhonchi or rales.   Abdominal:     General: There is no distension.     Palpations: Abdomen is soft.     Tenderness: There is no abdominal tenderness. There is no rebound.  Musculoskeletal:        General: No tenderness. Normal range of motion.     Cervical back: Normal range of motion and neck supple.  Skin:    General: Skin is warm and dry.     Findings: No abrasion or rash.  Neurological:     Mental Status: She is alert and oriented to person, place, and time. Mental status is at baseline.     GCS: GCS eye subscore is 4. GCS verbal subscore is 5. GCS motor subscore is 6.     Cranial Nerves: No cranial nerve deficit.     Sensory: No sensory deficit.     Motor: Motor function is intact.  Psychiatric:        Attention and Perception: Attention normal.        Speech: Speech normal.        Behavior: Behavior normal.    ED Results / Procedures / Treatments   Labs (all labs ordered are listed, but only abnormal results are displayed) Labs Reviewed  RESP PANEL BY RT-PCR (FLU A&B, COVID) ARPGX2    EKG None  Radiology No results found.  Procedures Procedures   Medications Ordered in ED Medications - No data to display  ED Course  I have reviewed the triage vital signs and the nursing notes.  Pertinent labs & imaging results that were available during my care of the patient were reviewed by me and considered in my medical decision making (see chart for details).    MDM Rules/Calculators/A&P                           Patient's pregnancy test is positive here.  She has no abdominal complaints.  No vaginal bleeding.  Flu test is positive as well 2.  We will follow-up with her doctor Final Clinical Impression(s) / ED Diagnoses Final diagnoses:  None    Rx / DC Orders ED Discharge Orders     None        02/14/2021, MD 01/22/21 1038

## 2021-01-22 NOTE — ED Triage Notes (Signed)
Pt complains of  cough, fever and chills and generalized aches that started yesterday.

## 2021-01-22 NOTE — ED Notes (Signed)
Patient verbalizes understanding of discharge instructions. Opportunity for questioning and answers were provided. Patient discharged from ED.  °

## 2021-01-24 ENCOUNTER — Encounter (HOSPITAL_BASED_OUTPATIENT_CLINIC_OR_DEPARTMENT_OTHER): Payer: Self-pay

## 2021-01-24 ENCOUNTER — Emergency Department (HOSPITAL_BASED_OUTPATIENT_CLINIC_OR_DEPARTMENT_OTHER)
Admission: EM | Admit: 2021-01-24 | Discharge: 2021-01-24 | Disposition: A | Discharge status: Home / Self Care | Payer: Medicaid Other | Attending: Emergency Medicine | Admitting: Emergency Medicine

## 2021-01-24 ENCOUNTER — Other Ambulatory Visit: Payer: Self-pay

## 2021-01-24 DIAGNOSIS — M549 Dorsalgia, unspecified: Secondary | ICD-10-CM | POA: Insufficient documentation

## 2021-01-24 DIAGNOSIS — R109 Unspecified abdominal pain: Secondary | ICD-10-CM | POA: Insufficient documentation

## 2021-01-24 DIAGNOSIS — R519 Headache, unspecified: Secondary | ICD-10-CM | POA: Diagnosis not present

## 2021-01-24 DIAGNOSIS — Z5321 Procedure and treatment not carried out due to patient leaving prior to being seen by health care provider: Secondary | ICD-10-CM | POA: Insufficient documentation

## 2021-01-24 DIAGNOSIS — K068 Other specified disorders of gingiva and edentulous alveolar ridge: Secondary | ICD-10-CM | POA: Insufficient documentation

## 2021-01-24 DIAGNOSIS — N644 Mastodynia: Secondary | ICD-10-CM | POA: Insufficient documentation

## 2021-01-24 NOTE — ED Notes (Signed)
Pt left after triage, no answer when called to room. Informed by registration that pat notified them she was leaving.

## 2021-01-24 NOTE — ED Triage Notes (Signed)
Pt recently diagnosed with flu, states "I'm not getting any better at home"; endorses L side of face hurting, gums bleeding, pain underneath bilateral breast to back, abdominal pain; productive cough

## 2021-02-12 ENCOUNTER — Telehealth (INDEPENDENT_AMBULATORY_CARE_PROVIDER_SITE_OTHER): Payer: Medicaid Other

## 2021-02-12 DIAGNOSIS — O0992 Supervision of high risk pregnancy, unspecified, second trimester: Secondary | ICD-10-CM

## 2021-02-12 DIAGNOSIS — O0990 Supervision of high risk pregnancy, unspecified, unspecified trimester: Secondary | ICD-10-CM | POA: Insufficient documentation

## 2021-02-12 DIAGNOSIS — O09219 Supervision of pregnancy with history of pre-term labor, unspecified trimester: Secondary | ICD-10-CM

## 2021-02-12 MED ORDER — PRENATAL 27-1 MG PO TABS: 1.0000 | ORAL_TABLET | Freq: Every day | ORAL | 0 refills | Status: DC

## 2021-02-12 MED ORDER — FAMOTIDINE 20 MG PO TABS: 20.0000 mg | ORAL_TABLET | Freq: Two times a day (BID) | ORAL | 0 refills | Status: DC

## 2021-02-12 MED ORDER — BLOOD PRESSURE KIT DEVI: 1.0000 | 0 refills | Status: DC | PRN

## 2021-02-12 NOTE — Progress Notes (Signed)
New OB Intake  I connected with  Ana [name] on 02/12/21 at 10:15 AM EST by MyChart Video Visit and verified that I am speaking with the correct person using two identifiers. Nurse is located at [facility] and pt is located at home.  I discussed the limitations, risks, security and privacy concerns of performing an evaluation and management service by telephone and the availability of in person appointments. I also discussed with the patient that there may be a patient responsible charge related to this service. The patient expressed understanding and agreed to proceed.  I explained I am completing New OB Intake today. We discussed her EDD of 09/17/21 that is based on LMP of 12/11/20. Pt is G10/P6. I reviewed her allergies, medications, Medical/Surgical/OB history, and appropriate screenings. I informed her of [facility] services. Based on history, this is a/an  pregnancy complicated by preterm labor  .   Patient Active Problem List   Diagnosis Date Noted   PROM (premature rupture of membranes) 10/14/2016   SVD (spontaneous vaginal delivery) 10/14/2016   GBS (group B Streptococcus carrier), +RV culture, currently pregnant 10/12/2016   Drug abuse, marijuana 10/05/2016   History of preterm delivery, currently pregnant 05/21/2016   Supervision of high risk pregnancy, antepartum, second trimester 04/21/2016   Asthma, mild intermittent 05/22/2014    Concerns addressed today: Heartburn  Delivery Plans:  Plans to deliver at [facility].   MyChart/Babyscripts MyChart access verified. I explained pt will have some visits in office and some virtually. Babyscripts instructions given and order placed. Patient verifies receipt of registration text/e-mail. Account successfully created and app downloaded.  Blood Pressure Cuff  Blood pressure cuff ordered for patient to pick-up from [employer]. Explained after first prenatal appt pt will check weekly and document in Babyscripts.  Weight scale: Patient     have weight scale. Weight scale ordered for patient to pick up form [facility].   Anatomy US Explained first scheduled [place] will be around 19 weeks. Anatomy US scheduled for 04/23/21 at 1030. Pt notified to arrive at 1030.  Labs Discussed Avelina Laine genetic screening with patient. Would like both Panorama and Horizon drawn at new OB visit. Routine prenatal labs needed.  Covid Vaccine Patient has not covid vaccine.   Centering in Pregnancy Candidate?  If yes, offer as possibility- Declined  Mother/ Baby Dyad Candidate?    If yes, offer as possibility- NA  Informed patient of [facility] website  and placed link in her AVS.   Social Determinants of Health Food Insecurity: Patient expresses food insecurity. Food Market information given to patient; explained patient may visit at the end of first OB appointment. WIC Referral: Patient is interested in referral to [facility].  Transportation: Patient denies transportation needs. Childcare: Discussed no children allowed at ultrasound appointments. Offered childcare services; patient declines childcare services at this time.  Send link to Pregnancy Navigators   Placed OB Box on problem list and updated  First visit review I reviewed new OB appt with pt. I explained she will have a pelvic exam, ob bloodwork with genetic screening, and PAP smear. Explained pt will be seen by Dr. Alysia Penna at first visit; encounter routed to appropriate provider. Explained that patient will be seen by pregnancy navigator following visit with provider. [facility] information placed in AVS.   Aviva Signs, CMA 02/12/2021  10:17 AM

## 2021-02-13 NOTE — Progress Notes (Signed)
Agree with nurses's documentation of this patient's clinic encounter.  Adelise Buswell L, MD  

## 2021-02-19 ENCOUNTER — Other Ambulatory Visit: Payer: Self-pay | Admitting: Obstetrics and Gynecology

## 2021-02-19 ENCOUNTER — Telehealth: Payer: Self-pay | Admitting: General Practice

## 2021-02-19 DIAGNOSIS — O0992 Supervision of high risk pregnancy, unspecified, second trimester: Secondary | ICD-10-CM

## 2021-02-19 MED ORDER — PRENATAL 27-1 MG PO TABS: 1.0000 | ORAL_TABLET | Freq: Every day | ORAL | 11 refills | Status: DC

## 2021-02-19 MED ORDER — FAMOTIDINE 20 MG PO TABS: 20.0000 mg | ORAL_TABLET | Freq: Two times a day (BID) | ORAL | 0 refills | Status: DC

## 2021-02-19 MED ORDER — BLOOD PRESSURE KIT DEVI: 1.0000 | 0 refills | Status: DC | PRN

## 2021-02-19 NOTE — Telephone Encounter (Signed)
Patient called into front office stating her blood pressure cuff Rx isn't at [facility] and her PNV and pepcid Rx isn't at [facility]. Per chart review, error in electronic delivery. Reordered medications.   Called patient, no answer- message states your call cannot be completed at this time. Will send mychart message.

## 2021-02-20 ENCOUNTER — Telehealth: Payer: Medicaid Other

## 2021-02-24 MED ORDER — GOJJI WEIGHT SCALE MISC: 1.0000 | Freq: Once | 0 refills | Status: AC

## 2021-02-24 NOTE — Addendum Note (Signed)
Addended by: Kathee Delton on: 02/24/2021 03:46 PM   Modules accepted: Orders

## 2021-02-28 ENCOUNTER — Other Ambulatory Visit (HOSPITAL_COMMUNITY)
Admission: RE | Admit: 2021-02-28 | Discharge: 2021-02-28 | Disposition: A | Discharge status: Home / Self Care | Payer: Medicaid Other | Source: Ambulatory Visit | Attending: Family Medicine | Admitting: Family Medicine

## 2021-02-28 ENCOUNTER — Encounter: Payer: Self-pay | Admitting: Obstetrics and Gynecology

## 2021-02-28 ENCOUNTER — Other Ambulatory Visit: Payer: Self-pay

## 2021-02-28 ENCOUNTER — Encounter: Payer: Self-pay | Admitting: *Deleted

## 2021-02-28 ENCOUNTER — Ambulatory Visit (INDEPENDENT_AMBULATORY_CARE_PROVIDER_SITE_OTHER): Payer: Medicaid Other | Admitting: Obstetrics and Gynecology

## 2021-02-28 VITALS — BP 134/90 | HR 74 | Wt 193.9 lb

## 2021-02-28 DIAGNOSIS — O0990 Supervision of high risk pregnancy, unspecified, unspecified trimester: Secondary | ICD-10-CM

## 2021-02-28 DIAGNOSIS — Z8751 Personal history of pre-term labor: Secondary | ICD-10-CM | POA: Diagnosis not present

## 2021-02-28 DIAGNOSIS — O10919 Unspecified pre-existing hypertension complicating pregnancy, unspecified trimester: Secondary | ICD-10-CM | POA: Insufficient documentation

## 2021-02-28 DIAGNOSIS — O99820 Streptococcus B carrier state complicating pregnancy: Secondary | ICD-10-CM

## 2021-02-28 MED ORDER — ASPIRIN EC 81 MG PO TBEC: 81.0000 mg | DELAYED_RELEASE_TABLET | Freq: Every day | ORAL | 2 refills | Status: DC

## 2021-02-28 MED ORDER — LABETALOL HCL 200 MG PO TABS: 200.0000 mg | ORAL_TABLET | Freq: Two times a day (BID) | ORAL | 3 refills | Status: DC

## 2021-02-28 NOTE — Patient Instructions (Signed)
First Trimester of Pregnancy °The first trimester of pregnancy starts on the first day of your last menstrual period until the end of week 12. This is months 1 through 3 of pregnancy. A week after a sperm fertilizes an egg, the egg will implant into the wall of the uterus and begin to develop into a baby. By the end of 12 weeks, all the baby's organs will be formed and the baby will be 2-3 inches in size. °Body changes during your first trimester °Your body goes through many changes during pregnancy. The changes vary and generally return to normal after your baby is born. °Physical changes °You may gain or lose weight. °Your breasts may begin to grow larger and become tender. The tissue that surrounds your nipples (areola) may become darker. °Dark spots or blotches (chloasma or mask of pregnancy) may develop on your face. °You may have changes in your hair. These can include thickening or thinning of your hair or changes in texture. °Health changes °You may feel nauseous, and you may vomit. °You may have heartburn. °You may develop headaches. °You may develop constipation. °Your gums may bleed and may be sensitive to brushing and flossing. °Other changes °You may tire easily. °You may urinate more often. °Your menstrual periods will stop. °You may have a loss of appetite. °You may develop cravings for certain kinds of food. °You may have changes in your emotions from day to day. °You may have more vivid and strange dreams. °Follow these instructions at home: °Medicines °Follow your health care provider's instructions regarding medicine use. Specific medicines may be either safe or unsafe to take during pregnancy. Do not take any medicines unless told to by your health care provider. °Take a prenatal vitamin that contains at least 600 micrograms (mcg) of folic acid. °Eating and drinking °Eat a healthy diet that includes fresh fruits and vegetables, whole grains, good sources of protein such as meat, eggs, or tofu,  and low-fat dairy products. °Avoid raw meat and unpasteurized juice, milk, and cheese. These carry germs that can harm you and your baby. °If you feel nauseous or you vomit: °Eat 4 or 5 small meals a day instead of 3 large meals. °Try eating a few soda crackers. °Drink liquids between meals instead of during meals. °You may need to take these actions to prevent or treat constipation: °Drink enough fluid to keep your urine pale yellow. °Eat foods that are high in fiber, such as beans, whole grains, and fresh fruits and vegetables. °Limit foods that are high in fat and processed sugars, such as fried or sweet foods. °Activity °Exercise only as directed by your health care provider. Most people can continue their usual exercise routine during pregnancy. Try to exercise for 30 minutes at least 5 days a week. °Stop exercising if you develop pain or cramping in the lower abdomen or lower back. °Avoid exercising if it is very hot or humid or if you are at high altitude. °Avoid heavy lifting. °If you choose to, you may have sex unless your health care provider tells you not to. °Relieving pain and discomfort °Wear a good support bra to relieve breast tenderness. °Rest with your legs elevated if you have leg cramps or low back pain. °If you develop bulging veins (varicose veins) in your legs: °Wear support hose as told by your health care provider. °Elevate your feet for 15 minutes, 3-4 times a day. °Limit salt in your diet. °Safety °Wear your seat belt at all times when driving   or riding in a car. °Talk with your health care provider if someone is verbally or physically abusive to you. °Talk with your health care provider if you are feeling sad or have thoughts of hurting yourself. °Lifestyle °Do not use hot tubs, steam rooms, or saunas. °Do not douche. Do not use tampons or scented sanitary pads. °Do not use herbal remedies, alcohol, illegal drugs, or medicines that are not approved by your health care provider. Chemicals  in these products can harm your baby. °Do not use any products that contain nicotine or tobacco, such as cigarettes, e-cigarettes, and chewing tobacco. If you need help quitting, ask your health care provider. °Avoid cat litter boxes and soil used by cats. These carry germs that can cause birth defects in the baby and possibly loss of the unborn baby (fetus) by miscarriage or stillbirth. °General instructions °During routine prenatal visits in the first trimester, your health care provider will do a physical exam, perform necessary tests, and ask you how things are going. Keep all follow-up visits. This is important. °Ask for help if you have counseling or nutritional needs during pregnancy. Your health care provider can offer advice or refer you to specialists for help with various needs. °Schedule a dentist appointment. At home, brush your teeth with a soft toothbrush. Floss gently. °Write down your questions. Take them to your prenatal visits. °Where to find more information °American Pregnancy Association: americanpregnancy.org °American College of Obstetricians and Gynecologists: acog.org/en/Womens%20Health/Pregnancy °Office on Women's Health: womenshealth.gov/pregnancy °Contact a health care provider if you have: °Dizziness. °A fever. °Mild pelvic cramps, pelvic pressure, or nagging pain in the abdominal area. °Nausea, vomiting, or diarrhea that lasts for 24 hours or longer. °A bad-smelling vaginal discharge. °Pain when you urinate. °Known exposure to a contagious illness, such as chickenpox, measles, Zika virus, HIV, or hepatitis. °Get help right away if you have: °Spotting or bleeding from your vagina. °Severe abdominal cramping or pain. °Shortness of breath or chest pain. °Any kind of trauma, such as from a fall or a car crash. °New or increased pain, swelling, or redness in an arm or leg. °Summary °The first trimester of pregnancy starts on the first day of your last menstrual period until the end of week  12 (months 1 through 3). °Eating 4 or 5 small meals a day rather than 3 large meals may help to relieve nausea and vomiting. °Do not use any products that contain nicotine or tobacco, such as cigarettes, e-cigarettes, and chewing tobacco. If you need help quitting, ask your health care provider. °Keep all follow-up visits. This is important. °This information is not intended to replace advice given to you by your health care provider. Make sure you discuss any questions you have with your health care provider. °Document Revised: 08/02/2019 Document Reviewed: 06/08/2019 °Elsevier Patient Education © 2022 [employer] ° °

## 2021-02-28 NOTE — Progress Notes (Signed)
Subjective:  Ana [name] is a 30 y.o. [id number] at 69w2dbeing seen today for her first OB visit. EDD by LMP. H/O GHTN in the past, suspect CHTN by several documented BP > 140/90, H/O PTD at 19 weeks due to placenta abruption.  She is currently monitored for the following issues for this high-risk pregnancy and has Asthma, mild intermittent; History of preterm delivery; Drug abuse, marijuana; GBS (group B Streptococcus carrier), +RV culture, currently pregnant; Supervision of high risk pregnancy, antepartum; and Chronic hypertension affecting pregnancy on their problem list.  Patient reports no complaints.  Contractions: Not present. Vag. Bleeding: None.  Movement: Absent. Denies leaking of fluid.   The following portions of the patient's history were reviewed and updated as appropriate: allergies, current medications, past family history, past medical history, past social history, past surgical history and problem list. Problem list updated.  Objective:   Vitals:   02/28/21 1010  BP: 134/90  Pulse: 74  Weight: 193 lb 14.4 oz (88 kg)    Fetal Status: Fetal Heart Rate (bpm): 166   Movement: Absent     General:  Alert, oriented and cooperative. Patient is in no acute distress.  Skin: Skin is warm and dry. No rash noted.   Cardiovascular: Normal heart rate noted  Respiratory: Normal respiratory effort, no problems with respiration noted  Abdomen: Soft, gravid, appropriate for gestational age. Pain/Pressure: Absent     Pelvic:  Cervical exam performed        Extremities: Normal range of motion.  Edema: None  Mental Status: Normal mood and affect. Normal behavior. Normal judgment and thought content.   Urinalysis:      Assessment and Plan:  Pregnancy: [id number] 157w2d1. Supervision of high risk pregnancy, antepartum Prenatal care and labs reviewed with pt Genetic testing discussed - Cytology - PAP( [place]) - Genetic Screening - Hemoglobin A1c - Culture, OB Urine -  CBC/D/Plt+RPR+Rh+ABO+RubIgG...  2. GBS (group B Streptococcus carrier), +RV culture, currently pregnant Stable  3. Chronic hypertension affecting pregnancy Reviewed with pt  Start BP meds and qd BASA Serial growth scans and antenatal testing as per protocol starting at 32 weeks - Comp Met (CMET) - Protein / creatinine ratio, urine - aspirin EC 81 MG tablet; Take 1 tablet (81 mg total) by mouth daily. Take after 12 weeks for prevention of preeclampsia later in pregnancy  Dispense: 300 tablet; Refill: 2 - labetalol (NORMODYNE) 200 MG tablet; Take 1 tablet (200 mg total) by mouth 2 (two) times daily.  Dispense: 60 tablet; Refill: 3  4. History of preterm delivery As noted above Not treatment recommended CL with anatomy scan  Preterm labor symptoms and general obstetric precautions including but not limited to vaginal bleeding, contractions, leaking of fluid and fetal movement were reviewed in detail with the patient. Please refer to After Visit Summary for other counseling recommendations.  Return in about 4 weeks (around 03/28/2021) for OB visit, face to face, MD only.   ErChancy MilroyMD

## 2021-03-01 LAB — PROTEIN / CREATININE RATIO, URINE
Creatinine, Urine: 170.2 mg/dL
Protein, Ur: 10.6 mg/dL
Protein/Creat Ratio: 62 mg/g creat (ref 0–200)

## 2021-03-01 LAB — COMPREHENSIVE METABOLIC PANEL
ALT: 11 IU/L (ref 0–32)
AST: 13 IU/L (ref 0–40)
Albumin/Globulin Ratio: 1.8 (ref 1.2–2.2)
Albumin: 4.1 g/dL (ref 3.8–4.8)
Alkaline Phosphatase: 64 IU/L (ref 44–121)
BUN/Creatinine Ratio: 12 (ref 9–23)
BUN: 8 mg/dL (ref 6–20)
Bilirubin Total: 0.4 mg/dL (ref 0.0–1.2)
CO2: 21 mmol/L (ref 20–29)
Calcium: 9.1 mg/dL (ref 8.7–10.2)
Chloride: 102 mmol/L (ref 96–106)
Creatinine, Ser: 0.68 mg/dL (ref 0.57–1.00)
Globulin, Total: 2.3 g/dL (ref 1.5–4.5)
Glucose: 88 mg/dL (ref 70–99)
Potassium: 3.7 mmol/L (ref 3.5–5.2)
Sodium: 136 mmol/L (ref 134–144)
Total Protein: 6.4 g/dL (ref 6.0–8.5)
eGFR: 119 mL/min/{1.73_m2} (ref >59)

## 2021-03-01 LAB — CBC/D/PLT+RPR+RH+ABO+RUBIGG...
Antibody Screen: NEGATIVE
Basophils Absolute: 0 10*3/uL (ref 0.0–0.2)
Basos: 1 %
EOS (ABSOLUTE): 0.2 10*3/uL (ref 0.0–0.4)
Eos: 3 %
HCV Ab: 0.1 s/co ratio (ref 0.0–0.9)
HIV Screen 4th Generation wRfx: NONREACTIVE
Hematocrit: 34.1 % (ref 34.0–46.6)
Hemoglobin: 11.3 g/dL (ref 11.1–15.9)
Hepatitis B Surface Ag: NEGATIVE
Immature Grans (Abs): 0 10*3/uL (ref 0.0–0.1)
Immature Granulocytes: 0 %
Lymphocytes Absolute: 1.5 10*3/uL (ref 0.7–3.1)
Lymphs: 22 %
MCH: 28.8 pg (ref 26.6–33.0)
MCHC: 33.1 g/dL (ref 31.5–35.7)
MCV: 87 fL (ref 79–97)
Monocytes Absolute: 0.3 10*3/uL (ref 0.1–0.9)
Monocytes: 5 %
Neutrophils Absolute: 4.9 10*3/uL (ref 1.4–7.0)
Neutrophils: 69 %
Platelets: 252 10*3/uL (ref 150–450)
RBC: 3.93 x10E6/uL (ref 3.77–5.28)
RDW: 12.3 % (ref 11.7–15.4)
RPR Ser Ql: NONREACTIVE
Rh Factor: POSITIVE
Rubella Antibodies, IGG: 4.56 index (ref >0.99)
WBC: 6.9 10*3/uL (ref 3.4–10.8)

## 2021-03-01 LAB — HEMOGLOBIN A1C
Est. average glucose Bld gHb Est-mCnc: 97 mg/dL
Hgb A1c MFr Bld: 5 % (ref 4.8–5.6)

## 2021-03-01 LAB — HCV INTERPRETATION

## 2021-03-02 LAB — URINE CULTURE, OB REFLEX

## 2021-03-02 LAB — CULTURE, OB URINE

## 2021-03-05 MED ORDER — VITAFOL GUMMIES 3.33-0.333-34.8 MG PO CHEW: 1.0000 | CHEWABLE_TABLET | Freq: Every day | ORAL | 11 refills | Status: DC

## 2021-03-05 NOTE — Addendum Note (Signed)
Addended by: Kathee Delton on: 03/05/2021 09:16 AM   Modules accepted: Orders

## 2021-03-06 ENCOUNTER — Telehealth: Payer: Self-pay | Admitting: Lactation Services

## 2021-03-06 ENCOUNTER — Encounter: Payer: Self-pay | Admitting: *Deleted

## 2021-03-06 ENCOUNTER — Encounter: Payer: Self-pay | Admitting: Obstetrics and Gynecology

## 2021-03-06 LAB — CYTOLOGY - PAP
Chlamydia: NEGATIVE
Comment: NEGATIVE
Comment: NEGATIVE
Comment: NEGATIVE
Comment: NORMAL
Diagnosis: NEGATIVE
HPV 16: NEGATIVE
HPV 18 / 45: NEGATIVE
High risk HPV: POSITIVE — AB
Neisseria Gonorrhea: NEGATIVE

## 2021-03-06 NOTE — Telephone Encounter (Signed)
Called patient at her request. Patient is concerned with HPV. Patient is concerned that it may harm infant. Reviewed it usually does not have effects on the pregnancy or the baby and that once the provider reads the results they will send her recommendations for follow up.   Reviewed that most cases clear in 1-2 years and patient will be treated per current recommendations. Patient voiced understanding.   Patient reports she has had a lump on her labia with a period in the past, she has never had it again.   Reviewed that HPV can cause wart like growths in some people but nut usually bumps or lumps.   Patient will call with any further questions or concerns as needed.

## 2021-03-07 ENCOUNTER — Inpatient Hospital Stay (HOSPITAL_COMMUNITY)
Admission: AD | Admit: 2021-03-07 | Discharge: 2021-03-07 | Discharge status: Against Medical Advice | Payer: Medicaid Other | Attending: Obstetrics & Gynecology | Admitting: Obstetrics & Gynecology

## 2021-03-07 ENCOUNTER — Other Ambulatory Visit: Payer: Self-pay

## 2021-03-07 ENCOUNTER — Encounter: Payer: Self-pay | Admitting: *Deleted

## 2021-03-07 DIAGNOSIS — R87810 Cervical high risk human papillomavirus (HPV) DNA test positive: Secondary | ICD-10-CM | POA: Insufficient documentation

## 2021-03-07 NOTE — MAU Provider Note (Signed)
Called patient from [place] and she was not there. Registrar informed me that patient left AMA.   Ana [name]

## 2021-03-09 NOTE — L&D Delivery Note (Signed)
Delivery Note At 10:14 AM a viable female was delivered via Vaginal, Spontaneous (Presentation: Left Occiput Anterior).  APGAR: 8, 9; weight 5 lb 4.3 oz (2390 g).   Placenta status: Spontaneous, Intact.  Cord: 3 vessels with the following complications: Placenta delivered slowly with maternal effort; edge of placenta appeared to adhere to the uterine wall and was removed with gentle downward traction and appeared complete on inspection .  Cord pH: NA  Anesthesia: Epidural Episiotomy: None Lacerations: None Suture Repair:  NA Est. Blood Loss (mL): 50  Mom to postpartum.  Baby to Couplet care / Skin to Skin.  Charlesetta Garibaldi Kera Deacon 09/06/2021, 11:45 AM

## 2021-03-13 ENCOUNTER — Encounter: Payer: Medicaid Other | Admitting: Family Medicine

## 2021-03-20 ENCOUNTER — Telehealth: Payer: Self-pay

## 2021-03-20 DIAGNOSIS — Z148 Genetic carrier of other disease: Secondary | ICD-10-CM | POA: Insufficient documentation

## 2021-03-20 DIAGNOSIS — D563 Thalassemia minor: Secondary | ICD-10-CM | POA: Insufficient documentation

## 2021-03-20 NOTE — Telephone Encounter (Signed)
Called Pt to go over [employer] 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 Pt verbalized understanding.

## 2021-03-25 ENCOUNTER — Telehealth: Payer: Self-pay

## 2021-03-25 ENCOUNTER — Ambulatory Visit (INDEPENDENT_AMBULATORY_CARE_PROVIDER_SITE_OTHER): Payer: Medicaid Other

## 2021-03-25 ENCOUNTER — Other Ambulatory Visit: Payer: Self-pay

## 2021-03-25 VITALS — BP 123/65 | HR 67 | Wt 192.2 lb

## 2021-03-25 DIAGNOSIS — Z0130 Encounter for examination of blood pressure without abnormal findings: Secondary | ICD-10-CM | POA: Diagnosis not present

## 2021-03-25 DIAGNOSIS — O219 Vomiting of pregnancy, unspecified: Secondary | ICD-10-CM

## 2021-03-25 MED ORDER — PROMETHAZINE HCL 25 MG PO TABS: 25.0000 mg | ORAL_TABLET | Freq: Four times a day (QID) | ORAL | 0 refills | Status: DC | PRN

## 2021-03-25 MED ORDER — ONDANSETRON HCL 4 MG PO TABS: 4.0000 mg | ORAL_TABLET | Freq: Four times a day (QID) | ORAL | 0 refills | Status: DC

## 2021-03-25 NOTE — Progress Notes (Signed)
Blood Pressure Check Visit  Ana [name] is here for blood pressure check. She is [redacted]w[redacted]d pregnant. She had elevated BP readings noted on babyscripts last night. BP today is 123/65. Patient endorses dizziness but states that it may be due to her inability to eat or drink very much from her nausea. Patient had phenergan prescribed this morning and plans to go pick it up on her way home. Patient denies headache, blurred vision, shortness of breath, peripheral edema. Patient states she is not taking her prescribed Labetalol because it made her feel very sluggish and low energy. Reviewed this with Dr Crissie Reese. Per Dr. Crissie Reese patient can also try taking Zofran for nausea. Zofran prescription sent in. Per Dr. Crissie Reese we will follow up with patient at her next OB appointment in 3 days on 03/28/21 where her blood pressure will be rechecked and we will assess how the anti-nausea medications are working. Reviewed this with patient. Patient denies any other questions or concerns.    Cline Crock, RN 03/25/2021  3:36 PM

## 2021-03-25 NOTE — Telephone Encounter (Addendum)
-----   Message from Hermina Staggers, MD sent at 03/25/2021  8:22 AM EST ----- Regarding: nurse visit Can we schedule this pt for a nurse visit today. Called last night elevated BP at home. Thanks [employer] pt at home number; call cannot be completed at this time. Called pt at mobile number; VM not set up. MyChart message sent.

## 2021-03-28 ENCOUNTER — Ambulatory Visit (INDEPENDENT_AMBULATORY_CARE_PROVIDER_SITE_OTHER): Payer: Medicaid Other | Admitting: Obstetrics & Gynecology

## 2021-03-28 ENCOUNTER — Encounter: Payer: Self-pay | Admitting: Obstetrics & Gynecology

## 2021-03-28 ENCOUNTER — Other Ambulatory Visit: Payer: Self-pay

## 2021-03-28 VITALS — BP 129/73 | HR 71

## 2021-03-28 DIAGNOSIS — O99340 Other mental disorders complicating pregnancy, unspecified trimester: Secondary | ICD-10-CM

## 2021-03-28 DIAGNOSIS — O10919 Unspecified pre-existing hypertension complicating pregnancy, unspecified trimester: Secondary | ICD-10-CM

## 2021-03-28 DIAGNOSIS — F419 Anxiety disorder, unspecified: Secondary | ICD-10-CM

## 2021-03-28 DIAGNOSIS — Z3A15 15 weeks gestation of pregnancy: Secondary | ICD-10-CM

## 2021-03-28 DIAGNOSIS — O0990 Supervision of high risk pregnancy, unspecified, unspecified trimester: Secondary | ICD-10-CM

## 2021-03-28 MED ORDER — BUSPIRONE HCL 10 MG PO TABS: 10.0000 mg | ORAL_TABLET | Freq: Three times a day (TID) | ORAL | 3 refills | Status: DC

## 2021-03-28 NOTE — Patient Instructions (Signed)
Please take the aspirin  Labetalol is needed to keep your BP below 135/85

## 2021-03-28 NOTE — Progress Notes (Signed)
° °  PRENATAL VISIT NOTE  Subjective:  Ana [name] is a 31 y.o. [id number] at [redacted]w[redacted]d being seen today for ongoing prenatal care.  She is currently monitored for the following issues for this high-risk pregnancy and has Asthma, mild intermittent; History of preterm delivery; Drug abuse, marijuana; Supervision of high risk pregnancy, antepartum; Chronic hypertension affecting pregnancy; Pap smear of cervix shows high risk HPV present; Alpha thalassemia silent carrier; and Carrier of spinal muscular atrophy on their problem list.  Patient reports no complaints.  Contractions: Not present. Vag. Bleeding: None.  Movement: Absent. Denies leaking of fluid.   The following portions of the patient's history were reviewed and updated as appropriate: allergies, current medications, past family history, past medical history, past social history, past surgical history and problem list.   Objective:   Vitals:   03/28/21 1109  BP: 129/73  Pulse: 71    Fetal Status: Fetal Heart Rate (bpm): 147   Movement: Absent     General:  Alert, oriented and cooperative. Patient is in no acute distress.  Skin: Skin is warm and dry. No rash noted.   Cardiovascular: Normal heart rate noted  Respiratory: Normal respiratory effort, no problems with respiration noted  Abdomen: Soft, gravid, appropriate for gestational age.  Pain/Pressure: Present     Pelvic: Cervical exam deferred        Extremities: Normal range of motion.  Edema: None  Mental Status: Normal mood and affect. Normal behavior. Normal judgment and thought content.   Assessment and Plan:  Pregnancy: [id number] at [redacted]w[redacted]d 1. Chronic hypertension affecting pregnancy Stable BP. Continue ASA and Labetalol. Will keep checking BP at home as recommended.  2. Anxiety during pregnancy Buspar prescribed for now.  Patient and/or legal guardian verbally consented to [facility] services about presenting concerns and psychiatric consultation as  appropriate. - busPIRone (BUSPAR) 10 MG tablet; Take 1 tablet (10 mg total) by mouth 3 (three) times daily.  Dispense: 60 tablet; Refill: 3 - [facility]  3. [redacted] weeks gestation of pregnancy 4. Supervision of high risk pregnancy, antepartum Low risk NIPS, anatomy scan scheduled. - AFP, Serum, Open Spina Bifida No other complaints or concerns.  Routine obstetric precautions reviewed.  Please refer to After Visit Summary for other counseling recommendations.   Return in about 4 weeks (around 04/25/2021) for OFFICE OB VISIT (MD or APP).  Future Appointments  Date Time Provider [place]  04/23/2021 10:30 AM [facility] NURSE [facility]  04/23/2021 10:45 AM [facility] US5 [facility] [place]    Verita Schneiders, MD

## 2021-03-30 LAB — AFP, SERUM, OPEN SPINA BIFIDA
AFP MoM: 0.81
AFP Value: 24.4 ng/mL
Gest. Age on Collection Date: 15.3 weeks
Maternal Age At EDD: 31.8 yr
OSBR Risk 1 IN: 10000
Test Results:: NEGATIVE
Weight: 192 [lb_av]

## 2021-03-31 NOTE — BH Specialist Note (Signed)
Pt did not arrive to video visit and did not answer the phone; Left HIPPA-compliant message to call back Larissa Pegg from [facility] at  [phone] (Tanelle Lanzo's office).  ?; left MyChart message for patient.  ? ?

## 2021-04-07 ENCOUNTER — Telehealth: Payer: Self-pay | Admitting: Obstetrics and Gynecology

## 2021-04-07 NOTE — Telephone Encounter (Signed)
142/63, reporting a headache, nausea and shortness of breath. Reported through baby scripts app.   She has CHTN at baseline. She takes her labetalol 200 bid. She did take it today at 12pm.   She said she has had a really bad day and is crying and noticeably upset. She did not want to talk to me about and does note she has an appt with [facility] coming up on 2/3.   She has phenergan and will try this for the nausea. She thinks all her symptoms are from her distress.   Reviewed if anything changes, she can always contact us or come to the MAU. Pt voiced understanding.   Milas Hock, MD Attending Obstetrician & Gynecologist, [facility] for [facility], [facility]

## 2021-04-11 ENCOUNTER — Other Ambulatory Visit: Payer: Self-pay

## 2021-04-11 ENCOUNTER — Ambulatory Visit: Payer: Self-pay | Admitting: Clinical

## 2021-04-11 DIAGNOSIS — Z91199 Patient's noncompliance with other medical treatment and regimen due to unspecified reason: Secondary | ICD-10-CM

## 2021-04-23 ENCOUNTER — Encounter: Payer: Self-pay | Admitting: *Deleted

## 2021-04-23 ENCOUNTER — Ambulatory Visit: Payer: Medicaid Other | Attending: Obstetrics and Gynecology

## 2021-04-23 ENCOUNTER — Other Ambulatory Visit: Payer: Self-pay | Admitting: *Deleted

## 2021-04-23 ENCOUNTER — Ambulatory Visit: Payer: Medicaid Other | Admitting: *Deleted

## 2021-04-23 ENCOUNTER — Other Ambulatory Visit: Payer: Self-pay

## 2021-04-23 VITALS — BP 135/65 | HR 85

## 2021-04-23 DIAGNOSIS — Z148 Genetic carrier of other disease: Secondary | ICD-10-CM | POA: Insufficient documentation

## 2021-04-23 DIAGNOSIS — D563 Thalassemia minor: Secondary | ICD-10-CM | POA: Diagnosis present

## 2021-04-23 DIAGNOSIS — O0990 Supervision of high risk pregnancy, unspecified, unspecified trimester: Secondary | ICD-10-CM

## 2021-04-23 DIAGNOSIS — R87810 Cervical high risk human papillomavirus (HPV) DNA test positive: Secondary | ICD-10-CM | POA: Diagnosis present

## 2021-04-23 DIAGNOSIS — O0992 Supervision of high risk pregnancy, unspecified, second trimester: Secondary | ICD-10-CM | POA: Insufficient documentation

## 2021-04-23 DIAGNOSIS — Z3492 Encounter for supervision of normal pregnancy, unspecified, second trimester: Secondary | ICD-10-CM

## 2021-04-25 ENCOUNTER — Ambulatory Visit (INDEPENDENT_AMBULATORY_CARE_PROVIDER_SITE_OTHER): Payer: Medicaid Other | Admitting: Obstetrics & Gynecology

## 2021-04-25 ENCOUNTER — Other Ambulatory Visit: Payer: Self-pay

## 2021-04-25 VITALS — BP 116/72 | HR 77 | Wt 198.8 lb

## 2021-04-25 DIAGNOSIS — O0990 Supervision of high risk pregnancy, unspecified, unspecified trimester: Secondary | ICD-10-CM

## 2021-04-25 DIAGNOSIS — O10919 Unspecified pre-existing hypertension complicating pregnancy, unspecified trimester: Secondary | ICD-10-CM

## 2021-04-25 DIAGNOSIS — Z1331 Encounter for screening for depression: Secondary | ICD-10-CM

## 2021-04-25 NOTE — Progress Notes (Signed)
° °  PRENATAL VISIT NOTE  Subjective:  Ana [name] is a 31 y.o. [id number] at [redacted]w[redacted]d being seen today for ongoing prenatal care.  She is currently monitored for the following issues for this high-risk pregnancy and has Asthma, mild intermittent; History of preterm delivery; Drug abuse, marijuana; Supervision of high risk pregnancy, antepartum; Chronic hypertension affecting pregnancy; Pap smear of cervix shows high risk HPV present; Alpha thalassemia silent carrier; and Carrier of spinal muscular atrophy on their problem list.  Patient reports no complaints.  Contractions: Not present. Vag. Bleeding: None.  Movement: Present. Denies leaking of fluid.   The following portions of the patient's history were reviewed and updated as appropriate: allergies, current medications, past family history, past medical history, past social history, past surgical history and problem list.   Objective:   Vitals:   04/25/21 0850  BP: 116/72  Pulse: 77  Weight: 198 lb 12.8 oz (90.2 kg)    Fetal Status: Fetal Heart Rate (bpm): 156   Movement: Present     General:  Alert, oriented and cooperative. Patient is in no acute distress.  Skin: Skin is warm and dry. No rash noted.   Cardiovascular: Normal heart rate noted  Respiratory: Normal respiratory effort, no problems with respiration noted  Abdomen: Soft, gravid, appropriate for gestational age.  Pain/Pressure: Present     Pelvic: Cervical exam deferred        Extremities: Normal range of motion.  Edema: None  Mental Status: Normal mood and affect. Normal behavior. Normal judgment and thought content.   Assessment and Plan:  Pregnancy: [id number] at [redacted]w[redacted]d 1. Supervision of high risk pregnancy, antepartum BP normal off labetalol, taking ASA  Preterm labor symptoms and general obstetric precautions including but not limited to vaginal bleeding, contractions, leaking of fluid and fetal movement were reviewed in detail with the patient. Please refer to After  Visit Summary for other counseling recommendations.   Return in about 4 weeks (around 05/23/2021).  Future Appointments  Date Time Provider [place]  05/19/2021 10:30 AM [facility] NURSE [facility]  05/19/2021 10:45 AM [facility] US4 [facility]  05/23/2021  8:55 AM Dione Plover, Annice Needy, MD [facility]    Emeterio Reeve, MD

## 2021-04-25 NOTE — Progress Notes (Signed)
Pt scored 13 on PHQ-9 & 15 ON GAD

## 2021-04-28 ENCOUNTER — Telehealth: Payer: Self-pay | Admitting: Family Medicine

## 2021-04-28 NOTE — Telephone Encounter (Signed)
Called patient in regards to a scheduling an appointment, there was no answer to the phone call so a voicemail was left with the call back number for the office.

## 2021-05-15 ENCOUNTER — Encounter: Payer: Self-pay | Admitting: Obstetrics & Gynecology

## 2021-05-15 ENCOUNTER — Telehealth: Payer: Self-pay | Admitting: Lactation Services

## 2021-05-15 MED ORDER — ONDANSETRON 4 MG PO TBDP: 4.0000 mg | ORAL_TABLET | Freq: Three times a day (TID) | ORAL | 0 refills | Status: DC | PRN

## 2021-05-15 NOTE — Telephone Encounter (Signed)
Called patient at her request. She reports yesterday she did not have energy and was having numbness to her legs and pain under her stomach. She reports she stayed in bed all day yesterday. Baby is moving fine. She is nauseous and throwing up. She was sweating a lot yesterday.  ? ?She has been taking Famotidine and Zofran and they are not working, although She has not been able to take in the last few days. Reviewed crushing Famotidine and putting in her jello to see if can get it down this way. Disolvable Zofran sent to patient's pharmacy per Dr. Shawnie Pons.  ? ?She denies being around anyone who was ill, she does not feel ill with a virus. Today, she has been able to drink water, Yogurt and cranberry juice.  ? ?Yesterday she ate cereal, 3 meatballs and jello and fruit. The last good meal she had was 3-4 days ago.  ? ?She is stooling every day, she is pretty gassy. She is feeling full all day long.  ? ?Reviewed that she needs to try to increase protein intake as she can and reviewed protein rich foods. Reviewed she most likely feeds tired due to decreased intake of calories.  ? ?Reviewed round ligament pain that is common around this time in pregnancy and that laying in bed all day, especially in one position can cause some numbness to the legs. Reviewed can take Tylenol for the pain, she reports she is afraid to take Tylenol with pregnancy.  ? ?Reviewed is pain worsens or she is not able to keep food down for more than 24 hours to go to MAU for evaluation.  ?

## 2021-05-19 ENCOUNTER — Ambulatory Visit: Payer: Medicaid Other | Attending: Obstetrics

## 2021-05-19 ENCOUNTER — Other Ambulatory Visit: Payer: Self-pay

## 2021-05-19 ENCOUNTER — Ambulatory Visit: Payer: Medicaid Other | Admitting: *Deleted

## 2021-05-19 ENCOUNTER — Other Ambulatory Visit: Payer: Self-pay | Admitting: *Deleted

## 2021-05-19 VITALS — BP 121/56 | HR 65

## 2021-05-19 DIAGNOSIS — O10012 Pre-existing essential hypertension complicating pregnancy, second trimester: Secondary | ICD-10-CM | POA: Diagnosis present

## 2021-05-19 DIAGNOSIS — D563 Thalassemia minor: Secondary | ICD-10-CM | POA: Insufficient documentation

## 2021-05-19 DIAGNOSIS — O0942 Supervision of pregnancy with grand multiparity, second trimester: Secondary | ICD-10-CM

## 2021-05-19 DIAGNOSIS — R87810 Cervical high risk human papillomavirus (HPV) DNA test positive: Secondary | ICD-10-CM | POA: Insufficient documentation

## 2021-05-19 DIAGNOSIS — Z3A21 21 weeks gestation of pregnancy: Secondary | ICD-10-CM | POA: Diagnosis not present

## 2021-05-19 DIAGNOSIS — Z148 Genetic carrier of other disease: Secondary | ICD-10-CM | POA: Insufficient documentation

## 2021-05-19 DIAGNOSIS — O0990 Supervision of high risk pregnancy, unspecified, unspecified trimester: Secondary | ICD-10-CM

## 2021-05-19 DIAGNOSIS — O10912 Unspecified pre-existing hypertension complicating pregnancy, second trimester: Secondary | ICD-10-CM

## 2021-05-19 DIAGNOSIS — Z3492 Encounter for supervision of normal pregnancy, unspecified, second trimester: Secondary | ICD-10-CM

## 2021-05-19 DIAGNOSIS — O321XX0 Maternal care for breech presentation, not applicable or unspecified: Secondary | ICD-10-CM | POA: Diagnosis not present

## 2021-05-19 DIAGNOSIS — O09892 Supervision of other high risk pregnancies, second trimester: Secondary | ICD-10-CM

## 2021-05-19 DIAGNOSIS — O09212 Supervision of pregnancy with history of pre-term labor, second trimester: Secondary | ICD-10-CM | POA: Diagnosis not present

## 2021-05-19 DIAGNOSIS — O285 Abnormal chromosomal and genetic finding on antenatal screening of mother: Secondary | ICD-10-CM | POA: Diagnosis not present

## 2021-05-19 DIAGNOSIS — O09292 Supervision of pregnancy with other poor reproductive or obstetric history, second trimester: Secondary | ICD-10-CM | POA: Diagnosis not present

## 2021-05-19 DIAGNOSIS — F1210 Cannabis abuse, uncomplicated: Secondary | ICD-10-CM

## 2021-05-23 ENCOUNTER — Encounter (HOSPITAL_COMMUNITY): Payer: Self-pay | Admitting: Obstetrics and Gynecology

## 2021-05-23 ENCOUNTER — Other Ambulatory Visit (HOSPITAL_COMMUNITY)
Admission: RE | Admit: 2021-05-23 | Discharge: 2021-05-23 | Disposition: A | Discharge status: Home / Self Care | Payer: Medicaid Other | Source: Ambulatory Visit | Attending: Family Medicine | Admitting: Family Medicine

## 2021-05-23 ENCOUNTER — Encounter: Payer: Self-pay | Admitting: *Deleted

## 2021-05-23 ENCOUNTER — Other Ambulatory Visit: Payer: Self-pay

## 2021-05-23 ENCOUNTER — Inpatient Hospital Stay (HOSPITAL_BASED_OUTPATIENT_CLINIC_OR_DEPARTMENT_OTHER): Payer: Medicaid Other

## 2021-05-23 ENCOUNTER — Encounter: Payer: Self-pay | Admitting: Family Medicine

## 2021-05-23 ENCOUNTER — Inpatient Hospital Stay (HOSPITAL_COMMUNITY)
Admission: AD | Admit: 2021-05-23 | Discharge: 2021-05-23 | Disposition: A | Discharge status: Home / Self Care | Payer: Medicaid Other | Attending: Obstetrics and Gynecology | Admitting: Obstetrics and Gynecology

## 2021-05-23 ENCOUNTER — Ambulatory Visit (INDEPENDENT_AMBULATORY_CARE_PROVIDER_SITE_OTHER): Payer: Medicaid Other | Admitting: Family Medicine

## 2021-05-23 VITALS — BP 109/70 | HR 75 | Wt 203.0 lb

## 2021-05-23 DIAGNOSIS — O09292 Supervision of pregnancy with other poor reproductive or obstetric history, second trimester: Secondary | ICD-10-CM | POA: Diagnosis not present

## 2021-05-23 DIAGNOSIS — Z8751 Personal history of pre-term labor: Secondary | ICD-10-CM

## 2021-05-23 DIAGNOSIS — Z641 Problems related to multiparity: Secondary | ICD-10-CM

## 2021-05-23 DIAGNOSIS — O4692 Antepartum hemorrhage, unspecified, second trimester: Secondary | ICD-10-CM

## 2021-05-23 DIAGNOSIS — B9689 Other specified bacterial agents as the cause of diseases classified elsewhere: Secondary | ICD-10-CM | POA: Diagnosis not present

## 2021-05-23 DIAGNOSIS — N898 Other specified noninflammatory disorders of vagina: Secondary | ICD-10-CM

## 2021-05-23 DIAGNOSIS — R109 Unspecified abdominal pain: Secondary | ICD-10-CM | POA: Diagnosis not present

## 2021-05-23 DIAGNOSIS — O26852 Spotting complicating pregnancy, second trimester: Secondary | ICD-10-CM | POA: Diagnosis not present

## 2021-05-23 DIAGNOSIS — O0942 Supervision of pregnancy with grand multiparity, second trimester: Secondary | ICD-10-CM | POA: Insufficient documentation

## 2021-05-23 DIAGNOSIS — O0990 Supervision of high risk pregnancy, unspecified, unspecified trimester: Secondary | ICD-10-CM

## 2021-05-23 DIAGNOSIS — Z3A22 22 weeks gestation of pregnancy: Secondary | ICD-10-CM | POA: Diagnosis not present

## 2021-05-23 DIAGNOSIS — O26892 Other specified pregnancy related conditions, second trimester: Secondary | ICD-10-CM | POA: Insufficient documentation

## 2021-05-23 DIAGNOSIS — O23592 Infection of other part of genital tract in pregnancy, second trimester: Secondary | ICD-10-CM | POA: Insufficient documentation

## 2021-05-23 DIAGNOSIS — R87810 Cervical high risk human papillomavirus (HPV) DNA test positive: Secondary | ICD-10-CM

## 2021-05-23 DIAGNOSIS — D563 Thalassemia minor: Secondary | ICD-10-CM

## 2021-05-23 DIAGNOSIS — O10012 Pre-existing essential hypertension complicating pregnancy, second trimester: Secondary | ICD-10-CM

## 2021-05-23 DIAGNOSIS — O10919 Unspecified pre-existing hypertension complicating pregnancy, unspecified trimester: Secondary | ICD-10-CM

## 2021-05-23 DIAGNOSIS — Z148 Genetic carrier of other disease: Secondary | ICD-10-CM

## 2021-05-23 HISTORY — DX: Anxiety disorder, unspecified: F41.9

## 2021-05-23 HISTORY — DX: Essential (primary) hypertension: I10

## 2021-05-23 HISTORY — DX: Female pelvic inflammatory disease, unspecified: N73.9

## 2021-05-23 HISTORY — DX: Dermatitis, unspecified: L30.9

## 2021-05-23 HISTORY — DX: Anemia, unspecified: D64.9

## 2021-05-23 LAB — URINALYSIS, ROUTINE W REFLEX MICROSCOPIC
Bilirubin Urine: NEGATIVE
Glucose, UA: NEGATIVE mg/dL
Hgb urine dipstick: NEGATIVE
Ketones, ur: NEGATIVE mg/dL
Nitrite: NEGATIVE
Protein, ur: NEGATIVE mg/dL
Specific Gravity, Urine: 1.025 (ref 1.005–1.030)
pH: 7 (ref 5.0–8.0)

## 2021-05-23 LAB — WET PREP, GENITAL
Sperm: NONE SEEN
Trich, Wet Prep: NONE SEEN
WBC, Wet Prep HPF POC: >=10 — AB (ref <10)
Yeast Wet Prep HPF POC: NONE SEEN

## 2021-05-23 MED ORDER — TRIAMCINOLONE ACETONIDE 0.025 % EX OINT: 1.0000 "application " | TOPICAL_OINTMENT | Freq: Two times a day (BID) | CUTANEOUS | 0 refills | Status: DC

## 2021-05-23 MED ORDER — POLYETHYLENE GLYCOL 3350 17 GM/SCOOP PO POWD: 17.0000 g | Freq: Every day | ORAL | 1 refills | Status: DC | PRN

## 2021-05-23 MED ORDER — METRONIDAZOLE 500 MG PO TABS: 500.0000 mg | ORAL_TABLET | Freq: Two times a day (BID) | ORAL | 0 refills | Status: DC

## 2021-05-23 NOTE — MAU Provider Note (Signed)
?History  ?  ? ?CSN: [id number] ? ?Arrival date and time: 05/23/21 1626 ? ? Event Date/Time  ? First Provider Initiated Contact with Patient 05/23/21 1829   ?  ? ?Chief Complaint  ?Patient presents with  ? Abdominal Pain  ? Spotting  ? ?HPI ?Ana [name] is a 31 y.o. [id number] at 61w2dwho presents to MAU with chief complaints of abdominal pain and vaginal spotting. ? ?Vaginal spotting ?This is a new problem, onset today. Patient reports seeing smears of bright red blood when she wiped after voiding. This occurred early this afternoon. She was seen in the office this morning but and self-collected a vaginal swab. She did not visualize bleeding on the swab and did not experience any pain during collection. ? ?Abdominal cramping ?This is a new problem, onset yesterday. Patient previously experienced right mid-abdominal cramping earlier in her pregnancy but states now her pain is suprapubic and radiates across her lower abdomen. Pain score 5-6/10. Pain is aggravated by walking, standing, repositioning. She denies alleviating factors. ? ?Ob history significant for: ?--hx of placental abruption and delivery at 19 weeks ?--CHTN on Labetalol 2077mBID ? ?OB History   ? ? Gravida  ?11  ? Para  ?6  ? Term  ?6  ? Preterm  ?0  ? AB  ?4  ? Living  ?6  ?  ? ? SAB  ?2  ? IAB  ?2  ? Ectopic  ?0  ? Multiple  ?0  ? Live Births  ?6  ?   ?  ?  ? ? ?Past Medical History:  ?Diagnosis Date  ? Abnormal Pap smear   ? Anemia   ? Anxiety   ? Asthma   ? Chlamydia 03/2012  ? Eczema   ? Gonorrhea   ? Hypertension   ? Kidney infection   ? PID (pelvic inflammatory disease)   ? Vaginal Pap smear, abnormal   ? ? ?Past Surgical History:  ?Procedure Laterality Date  ? DILATION AND CURETTAGE OF UTERUS    ? WISDOM TOOTH EXTRACTION    ? ? ?Family History  ?Problem Relation Age of Onset  ? Hypertension Mother   ? Heart attack Mother   ? Healthy Father   ? Hypertension Maternal Grandmother   ? ? ?Social History  ? ?Tobacco Use  ? Smoking status: Never  ?  Smokeless tobacco: Never  ?Vaping Use  ? Vaping Use: Never used  ?Substance Use Topics  ? Alcohol use: Not Currently  ?  Comment: occasionally  ? Drug use: Yes  ?  Types: Marijuana  ?  Comment: last 3/12  ? ? ?Allergies: No Known Allergies ? ?Medications Prior to Admission  ?Medication Sig Dispense Refill Last Dose  ? acetaminophen (TYLENOL) 325 MG tablet Take 650 mg by mouth every 6 (six) hours as needed.   05/23/2021  ? aspirin EC 81 MG tablet Take 1 tablet (81 mg total) by mouth daily. Take after 12 weeks for prevention of preeclampsia later in pregnancy 300 tablet 2 05/22/2021  ? famotidine (PEPCID) 20 MG tablet Take 1 tablet (20 mg total) by mouth 2 (two) times daily. 60 tablet 0 05/22/2021  ? labetalol (NORMODYNE) 200 MG tablet Take 1 tablet (200 mg total) by mouth 2 (two) times daily. 60 tablet 3 05/22/2021  ? ondansetron (ZOFRAN-ODT) 4 MG disintegrating tablet Take 1 tablet (4 mg total) by mouth every 8 (eight) hours as needed for nausea or vomiting. 20 tablet 0 05/22/2021  ? Prenatal Vit-Fe Phos-FA-Omega (VITAFOL  GUMMIES) 3.33-0.333-34.8 MG CHEW Chew 1 tablet by mouth daily. 30 tablet 11 05/22/2021  ? Blood Pressure Monitoring (BLOOD PRESSURE KIT) DEVI 1 Device by Does not apply route as needed. 1 each 0   ? busPIRone (BUSPAR) 10 MG tablet Take 1 tablet (10 mg total) by mouth 3 (three) times daily. (Patient not taking: Reported on 04/23/2021) 60 tablet 3   ? polyethylene glycol powder (GLYCOLAX/MIRALAX) 17 GM/SCOOP powder Take 17 g by mouth daily as needed. 510 g 1   ? triamcinolone (KENALOG) 0.025 % ointment Apply 1 application. topically 2 (two) times daily. 30 g 0   ? ? ?Review of Systems  ?Gastrointestinal:  Positive for abdominal pain.  ?Genitourinary:  Positive for vaginal bleeding.  ?All other systems reviewed and are negative. ?Physical Exam  ? ?Blood pressure 118/63, pulse 74, temperature 98.2 ?F (36.8 ?C), temperature source Oral, resp. rate 19, height '5\' 4"'  (1.626 m), weight 92.4 kg, last menstrual  period 12/11/2020, SpO2 98 %, unknown if currently breastfeeding. ? ?Physical Exam ?Vitals and nursing note reviewed. Exam conducted with a chaperone present.  ?Constitutional:   ?   Appearance: She is well-developed. She is not ill-appearing.  ?Cardiovascular:  ?   Rate and Rhythm: Normal rate and regular rhythm.  ?   Heart sounds: Normal heart sounds.  ?Pulmonary:  ?   Effort: Pulmonary effort is normal.  ?   Breath sounds: Normal breath sounds.  ?Abdominal:  ?   Palpations: Abdomen is soft.  ?   Tenderness: There is no abdominal tenderness. There is no guarding.  ?Genitourinary: ?   Comments: Pelvic exam: External genitalia normal, vaginal walls pink and well rugated, cervix visually closed, no lesions noted. Thin frothy discharge throughout vault. ? ? ?Neurological:  ?   Mental Status: She is alert.  ? ? ?MAU Course  ?Procedures ? ?MDM ? ?Orders Placed This Encounter  ?Procedures  ? Wet prep, genital  ? Culture, OB Urine  ? [place] MFM OB Limited  ? Urinalysis, Routine w reflex microscopic Urine, Clean Catch  ? Discharge patient  ? ?Patient Vitals for the past 24 hrs: ? BP Temp Temp src Pulse Resp SpO2 Height Weight  ?05/23/21 1936 122/62 98.2 ?F (36.8 ?C) Oral 75 17 100 % -- --  ?05/23/21 1759 118/63 98.2 ?F (36.8 ?C) Oral 74 19 98 % -- --  ?05/23/21 1754 -- -- -- -- -- -- '5\' 4"'  (1.626 m) 92.4 kg  ? ?Results for orders placed or performed during the hospital encounter of 05/23/21 (from the past 24 hour(s))  ?Wet prep, genital     Status: Abnormal  ? Collection Time: 05/23/21  6:26 PM  ? Specimen: Vaginal  ?Result Value Ref Range  ? Yeast Wet Prep HPF POC NONE SEEN NONE SEEN  ? Trich, Wet Prep NONE SEEN NONE SEEN  ? Clue Cells Wet Prep HPF POC PRESENT (A) NONE SEEN  ? WBC, Wet Prep HPF POC >=10 (A) <10  ? Sperm NONE SEEN   ?Urinalysis, Routine w reflex microscopic Urine, Clean Catch     Status: Abnormal  ? Collection Time: 05/23/21  6:29 PM  ?Result Value Ref Range  ? Color, Urine YELLOW YELLOW  ? APPearance CLOUDY  (A) CLEAR  ? Specific Gravity, Urine 1.025 1.005 - 1.030  ? pH 7.0 5.0 - 8.0  ? Glucose, UA NEGATIVE NEGATIVE mg/dL  ? Hgb urine dipstick NEGATIVE NEGATIVE  ? Bilirubin Urine NEGATIVE NEGATIVE  ? Ketones, ur NEGATIVE NEGATIVE mg/dL  ? Protein,  ur NEGATIVE NEGATIVE mg/dL  ? Nitrite NEGATIVE NEGATIVE  ? Leukocytes,Ua TRACE (A) NEGATIVE  ? RBC / HPF 0-5 0 - 5 RBC/hpf  ? WBC, UA 6-10 0 - 5 WBC/hpf  ? Bacteria, UA FEW (A) NONE SEEN  ? Squamous Epithelial / LPF 6-10 0 - 5  ? Mucus PRESENT   ? Amorphous Crystal PRESENT   ? ?Meds ordered this encounter  ?Medications  ? metroNIDAZOLE (FLAGYL) 500 MG tablet  ?  Sig: Take 1 tablet (500 mg total) by mouth 2 (two) times daily.  ?  Dispense:  14 tablet  ?  Refill:  0  ?  Order Specific Question:   Supervising Provider  ?  Answer:   Tania Ade H [[id number]]  ? ?Assessment and Plan  ?--31 y.o. [id number] at [redacted]w[redacted]d ?--No acute findings on MFM scan ?--Bacterial Vaginosis ?--No evidence of bleeding in pelvic exam ?--Blood type O POS ?--Urine culture in work ?--Discharge home in stable condition ? ?Ana [name] MSA, MSN, CNM ?Certified Nurse Midwife, [facility]? ?SDarlina [name] MN[place] MSN, CNM ?05/23/2021, 7:52 PM  ?

## 2021-05-23 NOTE — Patient Instructions (Signed)

## 2021-05-23 NOTE — Progress Notes (Signed)
? ?  Subjective:  ?Ana [name] is a 31 y.o. [id number] at 20w2dbeing seen today for ongoing prenatal care.  She is currently monitored for the following issues for this high-risk pregnancy and has Asthma, mild intermittent; History of preterm delivery; Supervision of high risk pregnancy, antepartum; Chronic hypertension affecting pregnancy; Pap smear of cervix shows high risk HPV present; Alpha thalassemia silent carrier; Carrier of spinal muscular atrophy; and Grand multiparity on their problem list. ? ?Patient reports no complaints.  Contractions: Irritability. Vag. Bleeding: None.  Movement: Present. Denies leaking of fluid.  ? ?The following portions of the patient's history were reviewed and updated as appropriate: allergies, current medications, past family history, past medical history, past social history, past surgical history and problem list. Problem list updated. ? ?Objective:  ? ?Vitals:  ? 05/23/21 0912  ?BP: 109/70  ?Pulse: 75  ?Weight: 203 lb (92.1 kg)  ? ? ?Fetal Status: Fetal Heart Rate (bpm): 150   Movement: Present    ? ?General:  Alert, oriented and cooperative. Patient is in no acute distress.  ?Skin: Skin is warm and dry. No rash noted.   ?Cardiovascular: Normal heart rate noted  ?Respiratory: Normal respiratory effort, no problems with respiration noted  ?Abdomen: Soft, gravid, appropriate for gestational age. Pain/Pressure: Present     ?Pelvic: Vag. Bleeding: None Vag D/C Character: White   ?Cervical exam deferred        ?Extremities: Normal range of motion.  Edema: None  ?Mental Status: Normal mood and affect. Normal behavior. Normal judgment and thought content.  ? ?Urinalysis:     ? ?Assessment and Plan:  ?Pregnancy: [id number] [redacted]w[redacted]d ?1. Vaginal discharge ?Self swab ?- Cervicovaginal ancillary only( [place]) ? ?2. Supervision of high risk pregnancy, antepartum ?BP and FHR normal ?Discussed contraception, wants post placental hormonal IUD ? ?3. Chronic hypertension affecting  pregnancy ?On asa, not taking labetalol ?normotensive ? ?4. History of preterm delivery ?IUFD at 1939 weeks5 other deliveries all term ? ?5. Pap smear of cervix shows high risk HPV present ?NILM, HPV+, neg 16/18/45, repeat in one year ? ?6. Carrier of spinal muscular atrophy ?Interested in partner testing, kit provided ? ?7. Alpha thalassemia silent carrier ? ? ?8. [place]ultiparity ?NSVD x6 ? ?9. Eczema ?Kenalog cream ? ?Preterm labor symptoms and general obstetric precautions including but not limited to vaginal bleeding, contractions, leaking of fluid and fetal movement were reviewed in detail with the patient. ?Please refer to After Visit Summary for other counseling recommendations.  ?[facility] 4 [facility] ([facility]20[facility]2021[facility] ? ? ?EcClarnce FlockMD ? ?

## 2021-05-23 NOTE — MAU Note (Signed)
Episode of bleeding.  Had done self STI testing today during appt, has not gotten results back ?

## 2021-05-23 NOTE — MAU Note (Signed)
Ana [name] is a 31 y.o. at [redacted]w[redacted]d here in MAU reporting: abdominal cramping and spotting with wiping.  Denies recent intercourse ? ?Onset of complaint: cramping yesterday & spotting today ?Pain score: 6 ?Vitals:  ? 05/23/21 1759  ?BP: 118/63  ?Pulse: 74  ?Resp: 19  ?Temp: 98.2 ?F (36.8 ?C)  ?SpO2: 98%  ?   ?FHT: 154 bpm w/ +FM.  Denies LOF ?Lab orders placed from triage:   U/A ?

## 2021-05-23 NOTE — Progress Notes (Signed)
Pt feels that she's not eating enough, nor having bowel movements enough. Also having a lot of gas with lower abdominal pain.Pt is concerned of vaginal d/c also. Pt also concerned of Eczema on leg. Pt also mentioned that all of a sudden last night she started to vomit and having abdominal pain.She feels that it's coming from not having frequent bowel movements.  ?

## 2021-05-25 LAB — CULTURE, OB URINE

## 2021-05-26 LAB — CERVICOVAGINAL ANCILLARY ONLY
Bacterial Vaginitis (gardnerella): POSITIVE — AB
Candida Glabrata: NEGATIVE
Candida Vaginitis: NEGATIVE
Chlamydia: NEGATIVE
Comment: NEGATIVE
Comment: NEGATIVE
Comment: NEGATIVE
Comment: NEGATIVE
Comment: NEGATIVE
Comment: NORMAL
Neisseria Gonorrhea: NEGATIVE
Trichomonas: NEGATIVE

## 2021-05-26 LAB — GC/CHLAMYDIA PROBE AMP (~~LOC~~) NOT AT ARMC
Chlamydia: NEGATIVE
Comment: NEGATIVE
Comment: NORMAL
Neisseria Gonorrhea: NEGATIVE

## 2021-05-26 MED ORDER — METRONIDAZOLE 0.75 % VA GEL: 1.0000 | Freq: Every day | VAGINAL | 0 refills | Status: AC

## 2021-05-26 NOTE — Addendum Note (Signed)
Addended by: Trayshawn Durkin on: 05/26/2021 01:23 PM ? ? Modules accepted: Orders ? ?

## 2021-05-28 ENCOUNTER — Encounter: Payer: Self-pay | Admitting: *Deleted

## 2021-06-04 ENCOUNTER — Telehealth: Payer: Self-pay | Admitting: General Practice

## 2021-06-04 NOTE — Telephone Encounter (Signed)
Called patient regarding elevated blood pressure 140/67 and nausea reported from babyscripts. Patient states she woke up at 230 this morning vomiting and has felt nauseous since then. She has not ate or drank anything this morning and has not taken her BP medication yet. Reviewed BRAT diet with patient and when she felt able, to try to take BP medication then recheck her pressure an hour later. Patient verbalized understanding. ?

## 2021-06-05 ENCOUNTER — Encounter: Payer: Self-pay | Admitting: Family Medicine

## 2021-06-05 ENCOUNTER — Other Ambulatory Visit: Payer: Self-pay | Admitting: Family Medicine

## 2021-06-05 ENCOUNTER — Telehealth: Payer: Self-pay

## 2021-06-05 DIAGNOSIS — O219 Vomiting of pregnancy, unspecified: Secondary | ICD-10-CM

## 2021-06-05 NOTE — Telephone Encounter (Addendum)
Called pt in regards to her MyChart message.  Pt reports that she has not been able to keep anything down and just feels tired.  Taking the Zofran but is now taking Miralax because of constipation she feels like she taking to much medication.  I asked pt if she is taking anything else in past for nausea.  Pt reports that she has taken Phenergan and it was effective.  I advised pt to start taking Phenergan keeping in mind that it can cause drowsiness.  Pt stated that she just has not been able to enjoy her pregnancy and wishes that she could.  I expressed empathy and hoped that her pregnancy would get better.  Pt verbalized with no further questions.   ? ?Keelyn Monjaras,RN  ? ?

## 2021-06-12 ENCOUNTER — Other Ambulatory Visit: Payer: Self-pay | Admitting: Lactation Services

## 2021-06-12 MED ORDER — PROMETHAZINE HCL 25 MG PO TABS: 25.0000 mg | ORAL_TABLET | Freq: Four times a day (QID) | ORAL | 0 refills | Status: DC | PRN

## 2021-06-12 NOTE — Progress Notes (Signed)
Refilled Promethazine per standing order at patients request.  ?

## 2021-06-17 ENCOUNTER — Other Ambulatory Visit: Payer: Self-pay | Admitting: *Deleted

## 2021-06-17 ENCOUNTER — Ambulatory Visit: Payer: Medicaid Other | Admitting: *Deleted

## 2021-06-17 ENCOUNTER — Ambulatory Visit: Payer: Medicaid Other | Attending: Obstetrics

## 2021-06-17 ENCOUNTER — Encounter: Payer: Self-pay | Admitting: *Deleted

## 2021-06-17 VITALS — BP 120/77 | HR 85

## 2021-06-17 DIAGNOSIS — O0990 Supervision of high risk pregnancy, unspecified, unspecified trimester: Secondary | ICD-10-CM | POA: Diagnosis present

## 2021-06-17 DIAGNOSIS — D563 Thalassemia minor: Secondary | ICD-10-CM | POA: Diagnosis present

## 2021-06-17 DIAGNOSIS — Z148 Genetic carrier of other disease: Secondary | ICD-10-CM | POA: Diagnosis present

## 2021-06-17 DIAGNOSIS — R87810 Cervical high risk human papillomavirus (HPV) DNA test positive: Secondary | ICD-10-CM

## 2021-06-17 DIAGNOSIS — O09892 Supervision of other high risk pregnancies, second trimester: Secondary | ICD-10-CM | POA: Insufficient documentation

## 2021-06-17 DIAGNOSIS — F1210 Cannabis abuse, uncomplicated: Secondary | ICD-10-CM | POA: Insufficient documentation

## 2021-06-17 DIAGNOSIS — O10012 Pre-existing essential hypertension complicating pregnancy, second trimester: Secondary | ICD-10-CM | POA: Diagnosis not present

## 2021-06-17 DIAGNOSIS — O10919 Unspecified pre-existing hypertension complicating pregnancy, unspecified trimester: Secondary | ICD-10-CM

## 2021-06-17 DIAGNOSIS — O10912 Unspecified pre-existing hypertension complicating pregnancy, second trimester: Secondary | ICD-10-CM | POA: Diagnosis present

## 2021-06-17 DIAGNOSIS — O0942 Supervision of pregnancy with grand multiparity, second trimester: Secondary | ICD-10-CM | POA: Diagnosis not present

## 2021-06-17 DIAGNOSIS — O09292 Supervision of pregnancy with other poor reproductive or obstetric history, second trimester: Secondary | ICD-10-CM | POA: Diagnosis not present

## 2021-06-17 DIAGNOSIS — Z362 Encounter for other antenatal screening follow-up: Secondary | ICD-10-CM

## 2021-06-17 DIAGNOSIS — Z3A25 25 weeks gestation of pregnancy: Secondary | ICD-10-CM

## 2021-06-17 DIAGNOSIS — O285 Abnormal chromosomal and genetic finding on antenatal screening of mother: Secondary | ICD-10-CM | POA: Diagnosis not present

## 2021-06-20 ENCOUNTER — Ambulatory Visit (INDEPENDENT_AMBULATORY_CARE_PROVIDER_SITE_OTHER): Payer: Medicaid Other | Admitting: Obstetrics and Gynecology

## 2021-06-20 ENCOUNTER — Encounter: Payer: Self-pay | Admitting: Obstetrics and Gynecology

## 2021-06-20 VITALS — BP 131/69 | HR 82 | Wt 207.5 lb

## 2021-06-20 DIAGNOSIS — Z3A26 26 weeks gestation of pregnancy: Secondary | ICD-10-CM

## 2021-06-20 DIAGNOSIS — Z23 Encounter for immunization: Secondary | ICD-10-CM

## 2021-06-20 DIAGNOSIS — O10919 Unspecified pre-existing hypertension complicating pregnancy, unspecified trimester: Secondary | ICD-10-CM

## 2021-06-20 DIAGNOSIS — R87810 Cervical high risk human papillomavirus (HPV) DNA test positive: Secondary | ICD-10-CM

## 2021-06-20 DIAGNOSIS — Z8751 Personal history of pre-term labor: Secondary | ICD-10-CM

## 2021-06-20 DIAGNOSIS — O0990 Supervision of high risk pregnancy, unspecified, unspecified trimester: Secondary | ICD-10-CM | POA: Diagnosis not present

## 2021-06-20 DIAGNOSIS — O409XX0 Polyhydramnios, unspecified trimester, not applicable or unspecified: Secondary | ICD-10-CM | POA: Insufficient documentation

## 2021-06-20 DIAGNOSIS — O99210 Obesity complicating pregnancy, unspecified trimester: Secondary | ICD-10-CM

## 2021-06-20 DIAGNOSIS — Z6835 Body mass index (BMI) 35.0-35.9, adult: Secondary | ICD-10-CM

## 2021-06-20 DIAGNOSIS — Z8759 Personal history of other complications of pregnancy, childbirth and the puerperium: Secondary | ICD-10-CM

## 2021-06-20 MED ORDER — TRIAMCINOLONE ACETONIDE 0.025 % EX OINT: 1.0000 "application " | TOPICAL_OINTMENT | Freq: Two times a day (BID) | CUTANEOUS | 1 refills | Status: DC

## 2021-06-20 NOTE — Progress Notes (Signed)
? ? ?  PRENATAL VISIT NOTE ? ?Subjective:  ?Ana [name] is a 31 y.o. [id number] at [redacted]w[redacted]d being seen today for ongoing prenatal care.  She is currently monitored for the following issues for this high-risk pregnancy and has Asthma, mild intermittent; History of preterm delivery; Supervision of high risk pregnancy, antepartum; Chronic hypertension affecting pregnancy; Pap smear of cervix shows high risk HPV present; Alpha thalassemia silent carrier; Carrier of spinal muscular atrophy; Grand multiparity; and Polyhydramnios affecting pregnancy on their problem list. ? ?Patient reports no complaints.  Contractions: Irritability. Vag. Bleeding: None.  Movement: Present. Denies leaking of fluid.  ? ?The following portions of the patient's history were reviewed and updated as appropriate: allergies, current medications, past family history, past medical history, past social history, past surgical history and problem list.  ? ?Objective:  ? ?Vitals:  ? 06/20/21 1102  ?BP: 131/69  ?Pulse: 82  ?Weight: 207 lb 8 oz (94.1 kg)  ? ? ?Fetal Status: Fetal Heart Rate (bpm): 154   Movement: Present    ? ?General:  Alert, oriented and cooperative. Patient is in no acute distress.  ?Skin: Skin is warm and dry. No rash noted.   ?Cardiovascular: Normal heart rate noted  ?Respiratory: Normal respiratory effort, no problems with respiration noted  ?Abdomen: Soft, gravid, appropriate for gestational age.  Pain/Pressure: Present     ?Pelvic: Cervical exam deferred        ?Extremities: Normal range of motion.  Edema: None  ?Mental Status: Normal mood and affect. Normal behavior. Normal judgment and thought content.  ? ?Assessment and Plan:  ?Pregnancy: [id number] at [redacted]w[redacted]d ?1. Supervision of high risk pregnancy, antepartum ?Routine care. 28wk labs nv. Ask about birth control/btl nv ?- Tdap vaccine greater than or equal to 7yo IM ? ?2. Chronic hypertension affecting pregnancy ?Pt states she takes the labetalol 200 once a day in the morning. BP  normal. Recommend checking BP before bed to see if she needs to increase to bid ?Follow up 5/8 rpt u/s. Continue low dose asa ?4/11: MVP 9.4, 40%, 864gm, ac 29% ? ?3. Polyhydramnios affecting pregnancy ?See above. Early a1c neg. F/u GTT next visit ? ?4. Pap smear of cervix shows high risk HPV present ?Repeat pap 02/2022 ? ?5. History of 19wk abruption ? ?Preterm labor symptoms and general obstetric precautions including but not limited to vaginal bleeding, contractions, leaking of fluid and fetal movement were reviewed in detail with the patient. ?Please refer to After Visit Summary for other counseling recommendations.  ? ?Return in about 2 weeks (around 07/04/2021) for in person, md or app, high risk ob, fasting 2hr GTT. ? ?Future Appointments  ?Date Time Provider [place]  ?07/03/2021  8:15 AM Hillard Danker Myles Rosenthal, PA-C [facility]  ?07/03/2021  8:50 AM [facility]  ?07/14/2021  3:30 PM [facility] NURSE [facility]  ?07/14/2021  3:45 PM [facility] US1 [facility]  ? ? ?Aletha Halim, MD ? ?

## 2021-06-20 NOTE — Patient Instructions (Signed)
Check your blood pressure before bed. Call us if it is 140 or higher for the top number or 90 or higher for the bottom ? ?

## 2021-06-21 ENCOUNTER — Telehealth: Payer: Self-pay | Admitting: Obstetrics & Gynecology

## 2021-06-21 NOTE — Telephone Encounter (Signed)
Called pt back- reviewed [place] report.  Reassured her that she does not need to worry about the polyhydramnios.  This is something that we will follow.  Concerns and questions were addressed. ? ?Myna Hidalgo, DO ?Attending Obstetrician & Gynecologist, Faculty Practice ?Center for [employer], [facility] ? ? ?

## 2021-06-30 ENCOUNTER — Other Ambulatory Visit: Payer: Self-pay

## 2021-06-30 DIAGNOSIS — O0990 Supervision of high risk pregnancy, unspecified, unspecified trimester: Secondary | ICD-10-CM

## 2021-07-02 ENCOUNTER — Encounter (HOSPITAL_COMMUNITY): Payer: Self-pay | Admitting: Obstetrics and Gynecology

## 2021-07-02 ENCOUNTER — Inpatient Hospital Stay (HOSPITAL_COMMUNITY)
Admission: AD | Admit: 2021-07-02 | Discharge: 2021-07-02 | Disposition: A | Discharge status: Home / Self Care | Payer: Medicaid Other | Attending: Obstetrics and Gynecology | Admitting: Obstetrics and Gynecology

## 2021-07-02 DIAGNOSIS — R109 Unspecified abdominal pain: Secondary | ICD-10-CM | POA: Diagnosis not present

## 2021-07-02 DIAGNOSIS — O26899 Other specified pregnancy related conditions, unspecified trimester: Secondary | ICD-10-CM | POA: Diagnosis not present

## 2021-07-02 DIAGNOSIS — Z0371 Encounter for suspected problem with amniotic cavity and membrane ruled out: Secondary | ICD-10-CM | POA: Diagnosis not present

## 2021-07-02 DIAGNOSIS — O368130 Decreased fetal movements, third trimester, not applicable or unspecified: Secondary | ICD-10-CM | POA: Diagnosis not present

## 2021-07-02 DIAGNOSIS — Z3A28 28 weeks gestation of pregnancy: Secondary | ICD-10-CM | POA: Diagnosis not present

## 2021-07-02 DIAGNOSIS — O26893 Other specified pregnancy related conditions, third trimester: Secondary | ICD-10-CM | POA: Diagnosis present

## 2021-07-02 DIAGNOSIS — R102 Pelvic and perineal pain: Secondary | ICD-10-CM | POA: Diagnosis not present

## 2021-07-02 LAB — URINALYSIS, ROUTINE W REFLEX MICROSCOPIC
Bilirubin Urine: NEGATIVE
Glucose, UA: NEGATIVE mg/dL
Hgb urine dipstick: NEGATIVE
Ketones, ur: 5 mg/dL — AB
Leukocytes,Ua: NEGATIVE
Nitrite: NEGATIVE
Protein, ur: NEGATIVE mg/dL
Specific Gravity, Urine: 1.026 (ref 1.005–1.030)
pH: 6 (ref 5.0–8.0)

## 2021-07-02 LAB — WET PREP, GENITAL
Clue Cells Wet Prep HPF POC: NONE SEEN
Sperm: NONE SEEN
Trich, Wet Prep: NONE SEEN
WBC, Wet Prep HPF POC: >=10 — AB (ref <10)
Yeast Wet Prep HPF POC: NONE SEEN

## 2021-07-02 MED ORDER — LACTATED RINGERS IV BOLUS
1000.0000 mL | Freq: Once | INTRAVENOUS | Status: AC
  Administered 2021-07-02: 1000 mL via INTRAVENOUS

## 2021-07-02 NOTE — MAU Provider Note (Signed)
?History  ?  ? ?CSN: [id number] ? ?Arrival date and time: 07/02/21 0945 ? ? Event Date/Time  ? First Provider Initiated Contact with Patient 07/02/21 1017   ?  ? ?Chief Complaint  ?Patient presents with  ? Contractions  ? Possible Pregnancy  ? pelvic pressure  ? ?HPI ? ?[name] is a 31 y.o. female [phone] @ 68w0dhere in MAU with complaints of decreased fetal movement; reports only feeling her baby move 2 x this morning. She reports pelvic pain with the urge to push. The pain is in her lower pelvis and comes and goes.  She reports clear/white fluid leaking from her vagina since yesterday.  She has not history of preterm deliveries. She has no bleeding.  ? ?OB History   ? ? Gravida  ?11  ? Para  ?6  ? Term  ?6  ? Preterm  ?0  ? AB  ?4  ? Living  ?6  ?  ? ? SAB  ?2  ? IAB  ?2  ? Ectopic  ?0  ? Multiple  ?0  ? Live Births  ?6  ?   ?  ?  ? ? ?Past Medical History:  ?Diagnosis Date  ? Abnormal Pap smear   ? Anemia   ? Anxiety   ? Asthma   ? Chlamydia 03/2012  ? Eczema   ? Gonorrhea   ? Hypertension   ? Kidney infection   ? PID (pelvic inflammatory disease)   ? Vaginal Pap smear, abnormal   ? ? ?Past Surgical History:  ?Procedure Laterality Date  ? DILATION AND CURETTAGE OF UTERUS    ? WISDOM TOOTH EXTRACTION    ? ? ?Family History  ?Problem Relation Age of Onset  ? Hypertension Mother   ? Heart attack Mother   ? Healthy Father   ? Hypertension Maternal Grandmother   ? ? ?Social History  ? ?Tobacco Use  ? Smoking status: Never  ? Smokeless tobacco: Never  ?Vaping Use  ? Vaping Use: Never used  ?Substance Use Topics  ? Alcohol use: Not Currently  ?  Comment: occasionally  ? Drug use: Yes  ?  Types: Marijuana  ?  Comment: last 3/12  ? ? ?Allergies: No Known Allergies ? ?Medications Prior to Admission  ?Medication Sig Dispense Refill Last Dose  ? acetaminophen (TYLENOL) 325 MG tablet Take 650 mg by mouth every 6 (six) hours as needed.     ? aspirin EC 81 MG tablet Take 1 tablet (81 mg total) by mouth daily. Take  after 12 weeks for prevention of preeclampsia later in pregnancy 300 tablet 2   ? Blood Pressure Monitoring (BLOOD PRESSURE KIT) DEVI 1 Device by Does not apply route as needed. 1 each 0   ? busPIRone (BUSPAR) 10 MG tablet Take 1 tablet (10 mg total) by mouth 3 (three) times daily. (Patient not taking: Reported on 04/23/2021) 60 tablet 3   ? famotidine (PEPCID) 20 MG tablet Take 1 tablet (20 mg total) by mouth 2 (two) times daily. 60 tablet 0   ? labetalol (NORMODYNE) 200 MG tablet Take 1 tablet (200 mg total) by mouth 2 (two) times daily. 60 tablet 3   ? ondansetron (ZOFRAN-ODT) 4 MG disintegrating tablet Take 1 tablet (4 mg total) by mouth every 8 (eight) hours as needed for nausea or vomiting. 20 tablet 0   ? polyethylene glycol powder (GLYCOLAX/MIRALAX) 17 GM/SCOOP powder Take 17 g by mouth daily as needed. 510 g 1   ?  Prenatal Vit-Fe Phos-FA-Omega (VITAFOL GUMMIES) 3.33-0.333-34.8 MG CHEW Chew 1 tablet by mouth daily. 30 tablet 11   ? promethazine (PHENERGAN) 25 MG tablet Take 1 tablet (25 mg total) by mouth every 6 (six) hours as needed for nausea or vomiting. 30 tablet 0   ? triamcinolone (KENALOG) 0.025 % ointment Apply 1 application. topically 2 (two) times daily. 30 g 1   ? ?Results for orders placed or performed during the hospital encounter of 07/02/21 (from the past 48 hour(s))  ?Wet prep, genital     Status: Abnormal  ? Collection Time: 07/02/21 10:15 AM  ? Specimen: Vaginal  ?Result Value Ref Range  ? Yeast Wet Prep HPF POC NONE SEEN NONE SEEN  ? Trich, Wet Prep NONE SEEN NONE SEEN  ? Clue Cells Wet Prep HPF POC NONE SEEN NONE SEEN  ? WBC, Wet Prep HPF POC >=10 (A) <10  ? Sperm NONE SEEN   ?  Comment: Performed at [facility], [place] [address], [place], [place] [address]  ?Urinalysis, Routine w reflex microscopic Urine, Clean Catch     Status: Abnormal  ? Collection Time: 07/02/21 10:45 AM  ?Result Value Ref Range  ? Color, Urine YELLOW YELLOW  ? APPearance CLEAR CLEAR  ? Specific Gravity, Urine  1.026 1.005 - 1.030  ? pH 6.0 5.0 - 8.0  ? Glucose, UA NEGATIVE NEGATIVE mg/dL  ? Hgb urine dipstick NEGATIVE NEGATIVE  ? Bilirubin Urine NEGATIVE NEGATIVE  ? Ketones, ur 5 (A) NEGATIVE mg/dL  ? Protein, ur NEGATIVE NEGATIVE mg/dL  ? Nitrite NEGATIVE NEGATIVE  ? Leukocytes,Ua NEGATIVE NEGATIVE  ?  Comment: Performed at [facility], [place] [address] [place], [place] [address]  ?  ?Review of Systems  ?Constitutional:  Negative for fever.  ?Gastrointestinal:  Positive for abdominal pain.  ?Genitourinary:  Positive for pelvic pain and vaginal discharge. Negative for dyspareunia, urgency and vaginal bleeding.  ?Physical Exam  ? ?Blood pressure 132/67, pulse 78, temperature 98.2 ?F (36.8 ?C), temperature source Oral, resp. rate 20, last menstrual period 12/11/2020, unknown if currently breastfeeding. ? ?Physical Exam ?Vitals and nursing note reviewed.  ?Constitutional:   ?   General: She is in acute distress.  ?   Appearance: Normal appearance.  ?HENT:  ?   Head: Normocephalic.  ?Pulmonary:  ?   Effort: Pulmonary effort is normal.  ?Genitourinary: ?   Comments: Vagina - Small amount of white vaginal discharge, no odor, no pooling of fluid.  ?Cervix - No contact bleeding, no active bleeding  ?Bimanual exam: ?Dilation: Closed, thick, posterior.  ?Exam by:: Noni Saupe, NP  ?Chaperone present for exam.   ?Musculoskeletal:  ?   Cervical back: Normal range of motion.  ?Neurological:  ?   Mental Status: She is alert and oriented to person, place, and time.  ? ?Fetal Tracing: ?Baseline: 140 bpm ?Variability: moderate  ?Accelerations: 10x10 ?Decelerations: None ?Toco: None ? ?MAU Course  ?Procedures ? ?MDM ? ?Lr bolus x 1 ?GC/ wet prep collected ?UA ?Patient sleeping and reports feeling much better.  ?Reactive NST, patient now feeling her baby move normally.  ?Assessment and Plan  ? ?A: ? ?1. Abdominal pain during pregnancy, antepartum   ?2. Encounter for suspected PROM, with rupture of membranes not found   ?3. [redacted] weeks  gestation of pregnancy   ?  ? ?P: ? ?Discharge home ?Increase oral fluid intake ?Return to MAU if symptoms worsen ? ?Lezlie Lye, NP ?07/02/2021 ?4:47 PM ? ?

## 2021-07-02 NOTE — MAU Note (Signed)
Ana [name] is a 31 y.o. at [redacted]w[redacted]d here in MAU reporting: noted pants were wet last night at 2100,  noted again this morning at 6.  Feeling pressure, baby is barely moving this morning, only once or twice. No bleeding. No hx of PTL.  Had a few loose stools.  Legs feel weak, can hardly walk, had to call someone to drive her in  ?Wants to push.  'too much amniotic fluid " ?Onset of complaint: 2100 ?Pain score: 7 ?There were no vitals filed for this visit.   ?FHT:150 ?Lab orders placed from triage:   ?

## 2021-07-03 ENCOUNTER — Ambulatory Visit (INDEPENDENT_AMBULATORY_CARE_PROVIDER_SITE_OTHER): Payer: Medicaid Other | Admitting: Medical

## 2021-07-03 ENCOUNTER — Encounter: Payer: Self-pay | Admitting: Medical

## 2021-07-03 ENCOUNTER — Other Ambulatory Visit: Payer: Medicaid Other

## 2021-07-03 VITALS — BP 118/79 | HR 75 | Wt 198.0 lb

## 2021-07-03 DIAGNOSIS — M549 Dorsalgia, unspecified: Secondary | ICD-10-CM

## 2021-07-03 DIAGNOSIS — J4520 Mild intermittent asthma, uncomplicated: Secondary | ICD-10-CM

## 2021-07-03 DIAGNOSIS — Z3A28 28 weeks gestation of pregnancy: Secondary | ICD-10-CM

## 2021-07-03 DIAGNOSIS — O0990 Supervision of high risk pregnancy, unspecified, unspecified trimester: Secondary | ICD-10-CM

## 2021-07-03 DIAGNOSIS — O10919 Unspecified pre-existing hypertension complicating pregnancy, unspecified trimester: Secondary | ICD-10-CM

## 2021-07-03 DIAGNOSIS — D563 Thalassemia minor: Secondary | ICD-10-CM

## 2021-07-03 DIAGNOSIS — Z148 Genetic carrier of other disease: Secondary | ICD-10-CM

## 2021-07-03 DIAGNOSIS — Z8759 Personal history of other complications of pregnancy, childbirth and the puerperium: Secondary | ICD-10-CM

## 2021-07-03 DIAGNOSIS — O409XX0 Polyhydramnios, unspecified trimester, not applicable or unspecified: Secondary | ICD-10-CM

## 2021-07-03 DIAGNOSIS — O99891 Other specified diseases and conditions complicating pregnancy: Secondary | ICD-10-CM

## 2021-07-03 DIAGNOSIS — O99210 Obesity complicating pregnancy, unspecified trimester: Secondary | ICD-10-CM

## 2021-07-03 DIAGNOSIS — Z641 Problems related to multiparity: Secondary | ICD-10-CM

## 2021-07-03 LAB — GC/CHLAMYDIA PROBE AMP (~~LOC~~) NOT AT ARMC
Chlamydia: NEGATIVE
Comment: NEGATIVE
Comment: NORMAL
Neisseria Gonorrhea: NEGATIVE

## 2021-07-03 NOTE — Progress Notes (Signed)
? ?  PRENATAL VISIT NOTE ? ?Subjective:  ?Ana [name] is a 31 y.o. [id number] at [redacted]w[redacted]d being seen today for ongoing prenatal care.  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 ? ?Patient reports no complaints. Patient was seen in MAU yesterday with pain and possible LOF. Pain resolved and she had normal labs that were reviewed today.   Contractions: Irritability. Vag. Bleeding: None.  Movement: Present. Denies leaking of fluid.  ? ?The following portions of the patient's history were reviewed and updated as appropriate: allergies, current medications, past family history, past medical history, past social history, past surgical history and problem list.  ? ?Objective:  ? ?Vitals:  ? 07/03/21 0910  ?BP: 118/79  ?Pulse: 75  ?Weight: 198 lb (89.8 kg)  ? ? ?Fetal Status: Fetal Heart Rate (bpm): 140   Movement: Present    ? ?General:  Alert, oriented and cooperative. Patient is in no acute distress.  ?Skin: Skin is warm and dry. No rash noted.   ?Cardiovascular: Normal heart rate noted  ?Respiratory: Normal respiratory effort, no problems with respiration noted  ?Abdomen: Soft, gravid, appropriate for gestational age.  Pain/Pressure: Absent     ?Pelvic: Cervical exam deferred        ?Extremities: Normal range of motion.     ?Mental Status: Normal mood and affect. Normal behavior. Normal judgment and thought content.  ? ?Assessment and Plan:  ?Pregnancy: [id number] at [redacted]w[redacted]d ?1. Supervision of high risk pregnancy, antepartum ?- 2 hour GTT, CBC, HIV, RPR today  ?- TDAP today  ? ?2. Carrier of spinal  muscular atrophy ? ?3. Obesity in pregnancy ?- BASA ? ?4. Polyhydramnios affecting pregnancy ?- Borderline noted on last [place] ?- GTT today  ?- Negative NIPS ? ?5. [place] multiparity ? ?6. Alpha thalassemia silent carrier ? ?7. Chronic hypertension affecting pregnancy ?- On Labetalol 200 mg, has not taken for a few days. Normotensive today ?- Follow-up growth [place] scheduled 07/14/21 ?- Discussed need for antenatal testing starting at 32 weeks  ? ?8. History of placenta abruption ? ?9. Mild intermittent asthma without complication ? ?10. [redacted] weeks gestation of pregnancy ? ?11. Back pain in pregnancy ?- Ambulatory referral to Physical Therapy ? ?Preterm labor symptoms and general obstetric precautions including but not limited to vaginal bleeding, contractions, leaking of fluid and fetal movement were reviewed in detail with the patient. ?Please refer to After Visit Summary for other counseling recommendations.  ? ?Return in about 2 weeks (around 07/17/2021) for [facility] APP, In-Person. ? ?Future Appointments  ?Date Time Provider [place]  ?07/14/2021  3:30 PM [facility] NURSE [facility]  ?07/14/2021  3:45 PM [facility] US1 [facility]  ?07/24/2021 11:15 AM Aletha Halim, MD [facility]  ?08/14/2021 10:35 AM Danielle Rankin [facility]  ?08/28/2021  9:55 AM Donnamae Jude, MD [facility]  ? ? ?Kerry Hough, PA-C ? ?

## 2021-07-03 NOTE — Patient Instructions (Signed)
AREA PEDIATRIC/FAMILY PRACTICE PHYSICIANS ? ?[place] ([address]) ?[facility] ?Chambliss, MD; Eniola, MD; Hale, MD; Hensel, MD; McDiarmid, MD; McIntyer, MD; Neal, MD; Walden, MD ?[address] [place], [place] [address] ?[phone] ?Mon-Fri 8:30-12:30, 1:30-5:00 ?Providers come to see babies at [facility] ?Accepting Medicaid ?[facility] ?Limited providers who accept newborns: Koirala, MD; Morrow, MD; Wolters, MD ?[address], [place], [place] [address] ?[phone] ?Mon-Fri 8:00-5:30 ?Babies seen by providers at [facility] ?Does NOT accept Medicaid ?Please call early in hospitalization for appointment (limited availability)  ?[facility] ?Mulberry, MD ?[address] [place], [place] [address] ?[phone] ?Mon, Tue, Thur, Fri 8:30-5:00, Wed 10:00-7:00 (closed 1-2pm) ?Babies seen by [facility] providers ?Accepting Medicaid ?[facility] ?Rubin, MD ?[address], [place], [place] [address] ?[phone] ?Mon-Fri 8:30-5:00, Sat 8:30-12:00 ?Provider comes to see babies at [facility] ?Accepting Medicaid ?Must have been referred from current patients or contacted office prior to delivery ?[facility] ([facility]) ?Brown, MD; Chandler, MD; Ettefagh, MD; Grant, MD; Lester, MD; McCormick, MD; McQueen, MD; Prose, MD; Simha, MD; Stanley, MD; Stryffeler, NP; Tebben, NP ?[address], [place], [place] [address] ?[phone] ?Mon, Tue, Thur, Fri 8:30-5:30, Wed 9:30-5:30, Sat 8:30-12:30 ?Babies seen by [facility] providers ?Accepting Medicaid ?Only accepting infants of first-time parents or siblings of current patients ?Hospital discharge coordinator will make follow-up appointment ?Jack Amos ?[address], [place], [place]  [address] ?[phone]   Fax - [phone] ?[facility] ?[address], [place], [place]  [address] ?Phone - [phone]   Fax - [phone] ?Shilpa Gosrani ?[address], [place], [place]  [address] ?[phone] ? ?[place] ([address]) ?[facility] ?Bates, MD; Brassfield, MD; Cooper, Cox, MD; MD; Davis, MD; Dovico, MD; Ettefaugh, MD; Little, MD; Lowe, MD; Keiffer, MD; Melvin, MD; Sumner, MD; Williams, MD ?[address], [place], [place] [address] ?[phone] ?Mon-Fri 8:30-5:00 (extended evenings Mon-Thur as needed), Sat-Sun 10:00-1:00 ?Providers come to see babies at [facility] ?Accepting Medicaid for families of first-time babies and families with all children in the household age 3 and under. Must register with office prior to making appointment (M-F only). ?[facility] ?Henson, NP; Knapp, MD; Lalonde, MD; Tysinger, PA ?[address] [place], [place] [address] ?[phone] ?Mon-Fri 8:00-5:00 ?Babies seen by providers at [facility] ?Does NOT accept Medicaid/Commercial Insurance Only ?[facility] ([facility])  ?Artis, MD; Barnes, MD; Bratton, MD; Coccaro, MD; [place] Gardner, MD; Kramer, MD; Marshall, MD; Netherton, MD; Poleto, MD; Skinner, MD ?[address] [place], [place] [address] ?[phone] ?Mon-Fri 8:30-5:30, Sat (Oct.-Mar.) 9:00-1:00 ?Babies seen by providers at [facility] ?Accepting Medicaid ? ?[place] ([address]) ?[facility] ?Reid, MD; Warner, MD ?[address], [place], [place] [address] ?[phone] ?Mon-Fri 8:30-5:00, Sat 8:30-12:00 ?Providers come to see babies at [facility] ?Does NOT accept Medicaid ?[facility] ?Becker, PA; Hagler, MD; Scifres, PA; Sun, MD; Swayne, MD ?[address], ,  [address] ?[phone] ?Mon-Fri 8:00-5:00 ?Babies seen by providers at [facility] ?Does NOT accept Medicaid ?Only accepting babies of parents who  are patients ?Please call early in hospitalization for appointment (limited availability) ? [facility] ?Clark, MD; Frye, MD; Kelleher, MD; Mack, NP; Miller, MD; O'Keller, MD; Patterson, NP; Pudlo, MD; Puzio, MD; Thomas, MD; Tucker, MD; Twiselton, MD ?[address], [place], [place] [address] ?[phone] ?Mon-Fri 8:00-5:00, Sat 9:00-12:00 ?Providers come to see babies at [facility] ?Does NOT accept Medicaid ? ?[place] ([address]) ?[facility] ?Limited providers accepting new patients: Brake, NP; Wharton, PA ?[address], [place], [place] [address] ?[phone] ?Mon-Fri 8:00-5:00 ?Babies seen by providers at [facility] ?Does NOT accept Medicaid ?Only accepting babies of parents who are patients ?Please call early in hospitalization for appointment (limited availability) ?[facility] ?Gay, MD; Quinlan, MD ?[address] [place], [place] [address] ?[phone] (press 1 to schedule appointment) ?Mon-Fri 8:00-5:00 ?Providers come to see babies at [facility] ?Does NOT accept Medicaid ?[facility] ?Mazer, MD ?[address] [place], [place] [address] ?[phone] ?Mon-Fri 8:30-5:00 (lunch 12:30-1:00), extended hours by appointment only Wed 5:00-6:30 ?Babies seen by [facility] providers ?Accepting Medicaid ?[facility] ?Banks, MD; Jordan, MD; Koberlein, MD ?[address], [place], [place] [address] ?[phone] ?Mon-Fri 8:00-5:00 ?Babies seen by [facility] providers ?Does NOT accept Medicaid ?[facility] ?Parker, MD; Hunter, MD; Wallace, DO ?[address] [place], [place] [address] ?[phone] ?Mon-Fri 8:00-5:00 ?Babies seen by [facility] providers ?Does NOT accept Medicaid ?[facility] ?Brandon, PA; Brecken, PA; Christy, NP; Dees, MD; DeClaire, MD; DeWeese, MD; Hansen, NP; Mills, NP; Parrish, NP; Smoot, NP; Summer, MD; Vapne,  MD ?[address] [place], [place] [address] ?[phone] ?Mon-Fri 8:30-5:00, Sat 10:00-1:00 ?Providers come to see babies at [facility] ?Does NOT accept Medicaid ?Free prenatal information session Tuesdays at 4:45pm ?[facility] ?Bouska, MD; Gordon, PA; Jeffery, PA; Weber, PA ?[address] [place] [address] ?[phone] ?Mon-Fri 7:30-5:30 ?Babies seen by [facility] providers ?[facility] ?[address], [place], [place]  [address] ?[phone]   Fax - [phone] ? ?[place] ([address]) ?[facility] ?Reese, MD ?[address] [place], [place] [address] ?[phone] ?Mon-Thur 8:00-6:00 ?Providers come to see babies at [facility] ?Accepting Medicaid ?[facility] ?Anderson, NP; Badger, MD; Beal, PA; Spencer, PA ?[address] [place], [place] [address] ?[phone] ?Mon-Thur 7:30-7:30, Fri 7:30-4:30 ?Babies seen by [facility] providers ?Accepting Medicaid ?[facility] ?Agbuya, MD; Klett, NP; Romgoolam, MD ?[address], [place], [place] [address] ?[phone] ?Mon-Fri 8:30-5:00, Sat 8:30-12:00 ?Providers come to see babies at [facility] ?Accepting Medicaid ?Must have ?Meet & Greet? appointment at office prior to delivery ?[facility] ([facility]) ?McCord, MD; Wallace, MD; Wood, MD ?[address], [place], [place] [address] ?[phone] ?Mon-Wed 8:00-6:00, Thur-Fri 8:00-5:00, Sat 9:00-12:00 ?Providers come to see babies at [facility] ?Does NOT accept Medicaid ?Only accepting siblings of current patients ?[facility]  ?[address], , [place]  [address] ?[phone]   Fax - [phone] ?[facility] ?[address], , [place]  [address] ?[phone]   Fax -  [phone] ? ?[place]  ([address] & [address]) ?[facility] ?Cirigliano, DO; Matthews, DO ?[address] , [place] [address] ?[phone] ?Mon-Fri 7:00-5:00 ?Babies seen by [facility]

## 2021-07-03 NOTE — Progress Notes (Signed)
Patient stated that she has not taken any of her medicines for the past week. She stated that she occasionally has vision disturbances and describes them as "sparks"  ?

## 2021-07-04 LAB — CBC
Hematocrit: 36.3 % (ref 34.0–46.6)
Hemoglobin: 11.4 g/dL (ref 11.1–15.9)
MCH: 28.4 pg (ref 26.6–33.0)
MCHC: 31.4 g/dL — ABNORMAL LOW (ref 31.5–35.7)
MCV: 90 fL (ref 79–97)
Platelets: 205 10*3/uL (ref 150–450)
RBC: 4.02 x10E6/uL (ref 3.77–5.28)
RDW: 12.4 % (ref 11.7–15.4)
WBC: 7.5 10*3/uL (ref 3.4–10.8)

## 2021-07-04 LAB — RPR: RPR Ser Ql: NONREACTIVE

## 2021-07-04 LAB — GLUCOSE TOLERANCE, 2 HOURS W/ 1HR
Glucose, 1 hour: 88 mg/dL (ref 70–179)
Glucose, 2 hour: 69 mg/dL — ABNORMAL LOW (ref 70–152)
Glucose, Fasting: 72 mg/dL (ref 70–91)

## 2021-07-04 LAB — HIV ANTIBODY (ROUTINE TESTING W REFLEX): HIV Screen 4th Generation wRfx: NONREACTIVE

## 2021-07-07 ENCOUNTER — Encounter: Payer: Self-pay | Admitting: Family Medicine

## 2021-07-08 ENCOUNTER — Encounter: Payer: Self-pay | Admitting: Physical Therapy

## 2021-07-08 ENCOUNTER — Ambulatory Visit: Payer: Medicaid Other | Attending: Medical | Admitting: Physical Therapy

## 2021-07-08 DIAGNOSIS — M6281 Muscle weakness (generalized): Secondary | ICD-10-CM | POA: Diagnosis not present

## 2021-07-08 DIAGNOSIS — Z3A00 Weeks of gestation of pregnancy not specified: Secondary | ICD-10-CM | POA: Diagnosis not present

## 2021-07-08 DIAGNOSIS — O99891 Other specified diseases and conditions complicating pregnancy: Secondary | ICD-10-CM | POA: Diagnosis present

## 2021-07-08 DIAGNOSIS — M549 Dorsalgia, unspecified: Secondary | ICD-10-CM | POA: Diagnosis not present

## 2021-07-08 DIAGNOSIS — M5459 Other low back pain: Secondary | ICD-10-CM | POA: Insufficient documentation

## 2021-07-08 NOTE — Therapy (Signed)
?OUTPATIENT PHYSICAL THERAPY THORACOLUMBAR EVALUATION ? ? ?Patient Name: Ana [name] ?MRN: [id number] ?DOB:05-07-1990, 31 y.o., female ?Today's Date: 07/08/2021 ? ? PT End of Session - 07/08/21 1055   ? ? Visit Number 1   ? Date for PT Re-Evaluation 09/02/21   ? Authorization Type Medicaid no auth >36 years old   ? PT Start Time 1100   ? PT Stop Time 1140   ? PT Time Calculation (min) 40 min   ? Activity Tolerance Patient tolerated treatment well   ? ?  ?  ? ?  ? ? ?Past Medical History:  ?Diagnosis Date  ? Abnormal Pap smear   ? Anemia   ? Anxiety   ? Asthma   ? Chlamydia 03/2012  ? Eczema   ? Gonorrhea   ? Hypertension   ? Kidney infection   ? PID (pelvic inflammatory disease)   ? Vaginal Pap smear, abnormal   ? ?Past Surgical History:  ?Procedure Laterality Date  ? DILATION AND CURETTAGE OF UTERUS    ? WISDOM TOOTH EXTRACTION    ? ?Patient Active Problem List  ? Diagnosis Date Noted  ? Polyhydramnios affecting pregnancy 06/20/2021  ? BMI 35.0-35.9,adult 06/20/2021  ? Obesity in pregnancy 06/20/2021  ? Grand multiparity 05/23/2021  ? Alpha thalassemia silent carrier 03/20/2021  ? Carrier of spinal muscular atrophy 03/20/2021  ? Pap smear of cervix shows high risk HPV present 03/07/2021  ? Chronic hypertension affecting pregnancy 02/28/2021  ? Supervision of high risk pregnancy, antepartum 02/12/2021  ? History of placenta abruption 05/21/2016  ? Asthma, mild intermittent 05/22/2014  ? ? ? ?REFERRING PROVIDER: Vonzella Nipple PA ? ?REFERRING DIAG: O99.891,M54.9 (ICD-10-CM) - Back pain in pregnancy ? ?THERAPY DIAG:  ?Pregnancy LBP ?ONSET DATE: 03/09/21 ? ?SUBJECTIVE:                                                                                                                                                                                          ? ?SUBJECTIVE STATEMENT: ?Pain worsening. Pain started at the 5 month mark.  Wearing belly band.  Cramp in mid back. Numbness in feet sometimes.  It feels like my back needs to  pop but I can't get it to ?PERTINENT HISTORY:  ?7th child; Due July 19th ? ?PAIN:  ?Are you having pain? Yes ?NPRS scale: 6-7/10 ?Pain location: low back, both legs; feet sometimes go numb ? ?  ?Aggravating factors: mornings rough, standing in place; LEs tired with short walks ?Relieving factors: pregnancy pillow on left kind of towards stomach; heat at night ?PRECAUTIONS: Other: pregnancy ? ?FALLS:  ?Has patient fallen in last 6 months? No ? ?  LIVING ENVIRONMENT: ?Lives with: lives with their family ?Lives in: House/apartment ? 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 ?PLOF: Independent with basic ADLs ? ?PATIENT GOALS relieve pain ? ? ?OBJECTIVE:  ? ? ? ? ?POSTURE:  ?WNLs ? ?PALPATION: ?Dec thoracic and lumbar fascial mobility  ? ?LUMBAR ROM:  WFLs; pt states standing extension is uncomfortable on her abdomen ? ?LE ROM: WFLS ? ? ?GAIT: ? ?Comments: WNLs ? ? ? ?TODAY'S TREATMENT  ?Flexion/rotation sidelying neutral gapping grade 3 3x 30 sec right and left ?Attempted hip oscillations no change; ?Quadruped rocking and cat /cow ? ? ?PATIENT EDUCATION:  ?Education details: Baby Bub sleep positioner; use of support belt; positions for relief  ?Person educated: Patient ?Education method: Explanation ?Education comprehension: verbalized understanding ? ? ?HOME EXERCISE PROGRAM: ?To be started next visit ? ?ASSESSMENT: ? ?CLINICAL IMPRESSION: ?Patient is a 31 y.o. female who was seen today for physical therapy evaluation and treatment for pregnancy related low back pain.  This is her 7th baby and she states her back pain started earlier in this pregnancy and symptoms are worsened in the AM, with standing and limiting her sleep.   She would benefit from PT for pain relief for the duration of her pregnancy.   ? ? ?OBJECTIVE IMPAIRMENTS increased fascial restrictions, impaired perceived functional ability, and pain.  ? ?ACTIVITY LIMITATIONS cleaning, meal prep, laundry, and shopping.  ? ?PERSONAL FACTORS Past/current  experiences are also affecting patient's functional outcome.  ? ? ?REHAB POTENTIAL: Good ? ?CLINICAL DECISION MAKING: Stable/uncomplicated ? ?EVALUATION COMPLEXITY: Low ? ? ?GOALS: ?Goals reviewed with patient? Yes ? ?SHORT TERM GOALS: Target date: 08/19/2021 ? ?The patient will demonstrate knowledge of basic self care strategies and exercises to promote healing   ?Baseline: ?Goal status: INITIAL ? ?2. The patient will report a 40% improvement in pain levels with functional activities which are currently difficult including mornings, sleeping and standing ?Baseline:  ?Goal status: INITIAL ? ? ? ? ? ?LONG TERM GOALS: Target date: 09/30/2021 ? ?The patient will be independent in a safe self progression of a home exercise program and self care strategies to promote further recovery of function and pain relief for the remainder of her pregnancy ?Baseline:  ?Goal status: INITIAL ? ?2.  The patient will report a 65% improvement in pain levels with functional activities which are currently difficult including mornings, sleeping and standing ?Baseline:  ?Goal status: INITIAL ? ?3.  The patient will be able to do stand for laundry and light meal prep with pain 3/10 or less ?Baseline:  ?Goal status: INITIAL ? ? ? ? ?PLAN: ?PT FREQUENCY: 1-2x/week ? ?PT DURATION: 12 weeks ? ?PLANNED INTERVENTIONS: Therapeutic exercises, Therapeutic activity, Neuromuscular re-education, Balance training, Gait training, Patient/Family education, Joint mobilization, Aquatic Therapy, Dry Needling, Spinal manipulation, Spinal mobilization, Taping, and Manual therapy. ? ?PLAN FOR NEXT SESSION: manual therapy neutral gapping; discuss aquatic PT; pt education ? ? ?Lavinia Sharps, PT ?07/08/21 4:25 PM ?Phone: [phone] ?Fax: [phone]  ?

## 2021-07-10 ENCOUNTER — Ambulatory Visit: Payer: Medicaid Other | Admitting: Physical Therapy

## 2021-07-10 DIAGNOSIS — M5459 Other low back pain: Secondary | ICD-10-CM

## 2021-07-10 DIAGNOSIS — M6281 Muscle weakness (generalized): Secondary | ICD-10-CM

## 2021-07-10 DIAGNOSIS — O99891 Other specified diseases and conditions complicating pregnancy: Secondary | ICD-10-CM | POA: Diagnosis not present

## 2021-07-10 NOTE — Therapy (Addendum)
OUTPATIENT PHYSICAL THERAPY THORACOLUMBAR DISCHARGE SUMMARY   Patient Name: Ana [name] MRN: [id number] DOB:Sep 21, 1990, 31 y.o., female Today's Date: 07/10/2021   PT End of Session - 07/10/21 1107     Visit Number 2    Date for PT Re-Evaluation 09/02/21    Authorization Type Medicaid no auth >30 years old    PT Start Time 1107    PT Stop Time 1144    PT Time Calculation (min) 37 min    Activity Tolerance Patient tolerated treatment well             Past Medical History:  Diagnosis Date   Abnormal Pap smear    Anemia    Anxiety    Asthma    Chlamydia 03/2012   Eczema    Gonorrhea    Hypertension    Kidney infection    PID (pelvic inflammatory disease)    Vaginal Pap smear, abnormal    Past Surgical History:  Procedure Laterality Date   DILATION AND CURETTAGE OF UTERUS     WISDOM TOOTH EXTRACTION     Patient Active Problem List   Diagnosis Date Noted   Polyhydramnios affecting pregnancy 06/20/2021   BMI 35.0-35.9,adult 06/20/2021   Obesity in pregnancy 06/20/2021   Grand multiparity 05/23/2021   Alpha thalassemia silent carrier 03/20/2021   Carrier of spinal muscular atrophy 03/20/2021   Pap smear of cervix shows high risk HPV present 03/07/2021   Chronic hypertension affecting pregnancy 02/28/2021   Supervision of high risk pregnancy, antepartum 02/12/2021   History of placenta abruption 05/21/2016   Asthma, mild intermittent 05/22/2014     REFERRING PROVIDER: Kerry Hough PA  REFERRING DIAG: O99.891,M54.9 (ICD-10-CM) - Back pain in pregnancy  THERAPY DIAG:  Pregnancy LBP ONSET DATE: 03/09/21  SUBJECTIVE:                                                                                                                                                                                           SUBJECTIVE STATEMENT: Went to the chiro on Tuesday after my appt.  They popped my neck and back and said you loosened me up first.  Since then I've felt better and  sleeping a bit better.   PERTINENT HISTORY:  7th child; Due July 19th  PAIN:  Are you having pain? Yes NPRS scale: 4-5/10 Pain location: low back, both legs; feet sometimes go numb    Aggravating factors: mornings rough, standing in place; LEs tired with short walks Relieving factors: pregnancy pillow on left kind of towards stomach; heat at night PRECAUTIONS: Other: pregnancy  FALLS:  Has patient fallen in last 6 months? 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  PLOF: Independent with basic ADLs  PATIENT GOALS relieve pain   OBJECTIVE:      POSTURE:  WNLs  PALPATION: Dec thoracic and lumbar fascial mobility   LUMBAR ROM:  WFLs; pt states standing extension is uncomfortable on her abdomen  LE ROM: WFLS   GAIT:  Comments: WNLs    TODAY'S TREATMENT  07/10/21 Sidelying transverse abdominus activation with Pilates ball push down 10x right/left  Sidelying clams 10x right/left Sidelying hip abduction isometric push 5 sec hold 5x each side Sidelying hip flexion isometric 5 sec hold 5x each side Sitting on green ball: pelvic rocks, green band rows, extensions and horizontal abduction 10x each    Day of eval:  Flexion/rotation sidelying neutral gapping grade 3 3x 30 sec right and left Attempted hip oscillations no change; Quadruped rocking and cat /cow   PATIENT EDUCATION:  Education details: Baby Bub sleep positioner; use of support belt; positions for relief  Person educated: Patient Education method: Explanation Education comprehension: verbalized understanding   HOME EXERCISE PROGRAM: Access Code: [facility] URL: [email or web address] Date: 07/10/2021 Prepared by: Ruben Im  Exercises - Sidelying Transversus Abdominis Bracing  - 1 x daily - 7 x weekly - 1 sets - 10 reps - Clamshell  - 1 x daily - 7 x weekly - 1 sets - 10 reps - Row with  Anchored Resistance on Swiss Ball  - 1 x daily - 7 x weekly - 10 reps - Seated Shoulder Extension and Scapular Retraction with Resistance on Swiss Ball  - 1 x daily - 7 x weekly - 1 sets - 10 reps - Swiss Ball March and Kick  - 1 x daily - 7 x weekly - 1 sets - 10 reps - Shoulder Horizontal Abduction with Resistance on Swiss Ball  - 1 x daily - 7 x weekly - 1 sets - 10 reps   ASSESSMENT:  CLINICAL IMPRESSION:  The patient is moving with fewer pain behaviors/less guarding.  She is able to perform basic core strengthening/stabilization in sidelying and sitting on ex ball comfortably.  Therapist monitoring response throughout session and modifying accordingly.     OBJECTIVE IMPAIRMENTS increased fascial restrictions, impaired perceived functional ability, and pain.   ACTIVITY LIMITATIONS cleaning, meal prep, laundry, and shopping.   PERSONAL FACTORS Past/current experiences are also affecting patient's functional outcome.    REHAB POTENTIAL: Good  CLINICAL DECISION MAKING: Stable/uncomplicated  EVALUATION COMPLEXITY: Low   GOALS: Goals reviewed with patient? Yes  SHORT TERM GOALS: Target date: 08/21/2021  The patient will demonstrate knowledge of basic self care strategies and exercises to promote healing   Baseline: Goal status: INITIAL  2. The patient will report a 40% improvement in pain levels with functional activities which are currently difficult including mornings, sleeping and standing Baseline:  Goal status: INITIAL      LONG TERM GOALS: Target date: 10/02/2021  The patient will be independent in a safe self progression of a home exercise program and self care strategies to promote further recovery of function and pain relief for the remainder of her pregnancy Baseline:  Goal status: INITIAL  2.  The patient will report a 65% improvement in pain levels with functional activities which are currently difficult including mornings, sleeping and standing Baseline:   Goal status: INITIAL  3.  The patient will be able to do stand for laundry and light meal prep with  pain 3/10 or less Baseline:  Goal status: INITIAL     PLAN: PT FREQUENCY: 1-2x/week  PT DURATION: 12 weeks  PLANNED INTERVENTIONS: Therapeutic exercises, Therapeutic activity, Neuromuscular re-education, Balance training, Gait training, Patient/Family education, Joint mobilization, Aquatic Therapy, Dry Needling, Spinal manipulation, Spinal mobilization, Taping, and Manual therapy.  PLAN FOR NEXT SESSION: follow up as needed 1-2 more times;  manual therapy neutral gapping; discuss aquatic PT; pt education   Ruben Im, PT 07/10/21 5:26 PM Phone: [phone] Fax: [phone]    PHYSICAL THERAPY DISCHARGE SUMMARY  Visits from Start of Care: 2  Current functional level related to goals / functional outcomes: The patient has not returned for follow up treatment   Remaining deficits: As above   Education / Equipment: Basic HEP   Patient agrees to discharge. Patient goals were partially met. Patient is being discharged due to not returning since the last visit.   Ruben Im, PT 11/06/21 8:31 AM Phone: [phone] Fax: [phone]

## 2021-07-14 ENCOUNTER — Ambulatory Visit: Payer: Medicaid Other | Attending: Obstetrics

## 2021-07-14 ENCOUNTER — Ambulatory Visit: Payer: Medicaid Other | Admitting: *Deleted

## 2021-07-14 VITALS — BP 121/64 | HR 97

## 2021-07-14 DIAGNOSIS — O285 Abnormal chromosomal and genetic finding on antenatal screening of mother: Secondary | ICD-10-CM | POA: Diagnosis not present

## 2021-07-14 DIAGNOSIS — D563 Thalassemia minor: Secondary | ICD-10-CM

## 2021-07-14 DIAGNOSIS — R87810 Cervical high risk human papillomavirus (HPV) DNA test positive: Secondary | ICD-10-CM | POA: Insufficient documentation

## 2021-07-14 DIAGNOSIS — O0990 Supervision of high risk pregnancy, unspecified, unspecified trimester: Secondary | ICD-10-CM

## 2021-07-14 DIAGNOSIS — O09213 Supervision of pregnancy with history of pre-term labor, third trimester: Secondary | ICD-10-CM

## 2021-07-14 DIAGNOSIS — O403XX0 Polyhydramnios, third trimester, not applicable or unspecified: Secondary | ICD-10-CM

## 2021-07-14 DIAGNOSIS — O10919 Unspecified pre-existing hypertension complicating pregnancy, unspecified trimester: Secondary | ICD-10-CM | POA: Diagnosis present

## 2021-07-14 DIAGNOSIS — Z148 Genetic carrier of other disease: Secondary | ICD-10-CM | POA: Insufficient documentation

## 2021-07-14 DIAGNOSIS — O10013 Pre-existing essential hypertension complicating pregnancy, third trimester: Secondary | ICD-10-CM | POA: Diagnosis not present

## 2021-07-14 DIAGNOSIS — O0943 Supervision of pregnancy with grand multiparity, third trimester: Secondary | ICD-10-CM

## 2021-07-14 DIAGNOSIS — Z3A29 29 weeks gestation of pregnancy: Secondary | ICD-10-CM

## 2021-07-15 ENCOUNTER — Other Ambulatory Visit: Payer: Self-pay | Admitting: *Deleted

## 2021-07-15 DIAGNOSIS — O403XX0 Polyhydramnios, third trimester, not applicable or unspecified: Secondary | ICD-10-CM

## 2021-07-15 DIAGNOSIS — O10919 Unspecified pre-existing hypertension complicating pregnancy, unspecified trimester: Secondary | ICD-10-CM

## 2021-07-17 ENCOUNTER — Telehealth: Payer: Self-pay | Admitting: Physical Therapy

## 2021-07-17 ENCOUNTER — Encounter: Payer: Self-pay | Admitting: Physical Therapy

## 2021-07-17 NOTE — Telephone Encounter (Signed)
Called and left voicemail regarding missed appt today.  Reminded of next appt on 5/18 ?

## 2021-07-24 ENCOUNTER — Encounter: Payer: Self-pay | Admitting: Obstetrics and Gynecology

## 2021-07-24 ENCOUNTER — Ambulatory Visit (INDEPENDENT_AMBULATORY_CARE_PROVIDER_SITE_OTHER): Payer: Medicaid Other | Admitting: Obstetrics and Gynecology

## 2021-07-24 ENCOUNTER — Encounter: Payer: Self-pay | Admitting: Physical Therapy

## 2021-07-24 VITALS — BP 122/66 | HR 89 | Wt 197.0 lb

## 2021-07-24 DIAGNOSIS — O0990 Supervision of high risk pregnancy, unspecified, unspecified trimester: Secondary | ICD-10-CM

## 2021-07-24 DIAGNOSIS — O99210 Obesity complicating pregnancy, unspecified trimester: Secondary | ICD-10-CM

## 2021-07-24 DIAGNOSIS — O10919 Unspecified pre-existing hypertension complicating pregnancy, unspecified trimester: Secondary | ICD-10-CM

## 2021-07-24 DIAGNOSIS — Z3A31 31 weeks gestation of pregnancy: Secondary | ICD-10-CM

## 2021-07-24 DIAGNOSIS — Z641 Problems related to multiparity: Secondary | ICD-10-CM

## 2021-07-24 DIAGNOSIS — O409XX0 Polyhydramnios, unspecified trimester, not applicable or unspecified: Secondary | ICD-10-CM

## 2021-07-24 DIAGNOSIS — L309 Dermatitis, unspecified: Secondary | ICD-10-CM

## 2021-07-24 DIAGNOSIS — Z6835 Body mass index (BMI) 35.0-35.9, adult: Secondary | ICD-10-CM

## 2021-07-24 MED ORDER — TRIAMCINOLONE ACETONIDE 0.025 % EX OINT: TOPICAL_OINTMENT | Freq: Two times a day (BID) | CUTANEOUS | 0 refills | Status: DC | PRN

## 2021-07-24 NOTE — Progress Notes (Signed)
   PRENATAL VISIT NOTE  Subjective:  Ana [name] is a 31 y.o. [id number] at [redacted]w[redacted]d being seen today for ongoing prenatal care.  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  Patient reports  improved musculoskeletal pain .  Contractions: Irritability. Vag. Bleeding: None.  Movement: Present. Denies leaking of fluid.   The following portions of the patient's history were reviewed and updated as appropriate: allergies, current medications, past family history, past medical history, past social history, past surgical history and problem list.   Objective:   Vitals:   07/24/21 1110  BP: 122/66  Pulse: 89  Weight: 197 lb (89.4 kg)    Fetal Status: Fetal Heart Rate (bpm): 143   Movement: Present     General:  Alert, oriented and cooperative. Patient is in no acute distress.  Skin: Skin is warm and dry. No rash noted.   Cardiovascular: Normal heart rate noted  Respiratory: Normal respiratory effort, no problems with respiration noted  Abdomen: Soft, gravid, appropriate for gestational age.  Pain/Pressure: Present     Pelvic: Cervical exam deferred        Extremities: Normal range of motion.     Mental Status: Normal mood and affect. Normal behavior. Normal judgment and thought content.   Assessment and Plan:  Pregnancy: [id number] at [redacted]w[redacted]d 1. Chronic hypertension affecting pregnancy Not taking her labetalol. BPs are fine. I told her to continue to hold off on taking it and continue the low dose ASA. I told her that we will keep a close eye on it at her weekly  visits  2. Supervision of high risk pregnancy, antepartum Interested in PP IUD, not BTL  3. Polyhydramnios affecting pregnancy Normal GTT. F/u qwk scans  4. [place] multiparity  5. BMI 35.0-35.9,adult  6. Obesity in pregnancy  7. [redacted] weeks gestation of pregnancy  Preterm labor symptoms and general obstetric precautions including but not limited to vaginal bleeding, contractions, leaking of fluid and fetal movement were reviewed in detail with the patient. Please refer to After Visit Summary for other counseling recommendations.   Return in about 3 weeks (around 08/14/2021) for in person, md or app, high risk ob.  Future Appointments  Date Time Provider [place]  08/05/2021  9:30 AM [facility] NURSE [facility]  08/05/2021  9:45 AM [facility] NST [facility]  08/12/2021  8:15 AM [facility] NURSE [facility]  08/12/2021  8:30 AM [facility] US3 [facility]  08/14/2021 10:35 AM Danielle Rankin [facility]  08/18/2021  8:30 AM [facility] NURSE [facility]  08/18/2021  8:45 AM [facility] US4 [facility]  08/28/2021  9:55 AM Kennon Rounds, Standley Dakins, MD [facility]    Aletha Halim, MD

## 2021-07-24 NOTE — Addendum Note (Signed)
Addended by: Oak[place] Bing on: 07/24/2021 12:08 PM   Modules accepted: Orders

## 2021-07-24 NOTE — Addendum Note (Signed)
Addended by: Marjo Bicker on: 07/24/2021 05:38 PM   Modules accepted: Orders

## 2021-07-30 ENCOUNTER — Encounter (HOSPITAL_COMMUNITY): Payer: Self-pay | Admitting: Obstetrics and Gynecology

## 2021-07-30 ENCOUNTER — Inpatient Hospital Stay (HOSPITAL_COMMUNITY)
Admission: AD | Admit: 2021-07-30 | Discharge: 2021-07-30 | Disposition: A | Discharge status: Home / Self Care | Payer: Medicaid Other | Attending: Obstetrics and Gynecology | Admitting: Obstetrics and Gynecology

## 2021-07-30 ENCOUNTER — Other Ambulatory Visit: Payer: Self-pay

## 2021-07-30 DIAGNOSIS — O26893 Other specified pregnancy related conditions, third trimester: Secondary | ICD-10-CM

## 2021-07-30 DIAGNOSIS — A084 Viral intestinal infection, unspecified: Secondary | ICD-10-CM | POA: Insufficient documentation

## 2021-07-30 DIAGNOSIS — Z3A32 32 weeks gestation of pregnancy: Secondary | ICD-10-CM | POA: Diagnosis not present

## 2021-07-30 DIAGNOSIS — R519 Headache, unspecified: Secondary | ICD-10-CM | POA: Insufficient documentation

## 2021-07-30 LAB — URINALYSIS, ROUTINE W REFLEX MICROSCOPIC
Bilirubin Urine: NEGATIVE
Glucose, UA: NEGATIVE mg/dL
Hgb urine dipstick: NEGATIVE
Ketones, ur: 20 mg/dL — AB
Leukocytes,Ua: NEGATIVE
Nitrite: NEGATIVE
Protein, ur: NEGATIVE mg/dL
Specific Gravity, Urine: 1.012 (ref 1.005–1.030)
pH: 7 (ref 5.0–8.0)

## 2021-07-30 MED ORDER — METOCLOPRAMIDE HCL 10 MG PO TABS
10.0000 mg | ORAL_TABLET | Freq: Once | ORAL | Status: AC
  Administered 2021-07-30: 10 mg via ORAL
  Filled 2021-07-30: qty 1

## 2021-07-30 MED ORDER — FAMOTIDINE IN NACL 20-0.9 MG/50ML-% IV SOLN
20.0000 mg | Freq: Once | INTRAVENOUS | Status: DC
  Filled 2021-07-30: qty 50

## 2021-07-30 MED ORDER — ACETAMINOPHEN 500 MG PO TABS
1000.0000 mg | ORAL_TABLET | Freq: Once | ORAL | Status: AC
  Administered 2021-07-30: 1000 mg via ORAL
  Filled 2021-07-30: qty 2

## 2021-07-30 MED ORDER — MECLIZINE HCL 25 MG PO TABS
25.0000 mg | ORAL_TABLET | Freq: Once | ORAL | Status: AC
  Administered 2021-07-30: 25 mg via ORAL
  Filled 2021-07-30: qty 1

## 2021-07-30 MED ORDER — ONDANSETRON 4 MG PO TBDP
8.0000 mg | ORAL_TABLET | Freq: Once | ORAL | Status: AC
  Administered 2021-07-30: 8 mg via ORAL
  Filled 2021-07-30: qty 2

## 2021-07-30 MED ORDER — ONDANSETRON 8 MG PO TBDP: 8.0000 mg | ORAL_TABLET | Freq: Three times a day (TID) | ORAL | 0 refills | Status: DC | PRN

## 2021-07-30 MED ORDER — LACTATED RINGERS IV BOLUS: 1000.0000 mL | Freq: Once | INTRAVENOUS | Status: DC

## 2021-07-30 MED ORDER — FAMOTIDINE 20 MG PO TABS
20.0000 mg | ORAL_TABLET | Freq: Once | ORAL | Status: AC
  Administered 2021-07-30: 20 mg via ORAL
  Filled 2021-07-30: qty 1

## 2021-07-30 MED ORDER — CYCLOBENZAPRINE HCL 5 MG PO TABS
10.0000 mg | ORAL_TABLET | Freq: Once | ORAL | Status: AC
  Administered 2021-07-30: 10 mg via ORAL
  Filled 2021-07-30: qty 2

## 2021-07-30 MED ORDER — ONDANSETRON HCL 4 MG/2ML IJ SOLN: 4.0000 mg | Freq: Once | INTRAMUSCULAR | Status: DC

## 2021-07-30 MED ORDER — METOCLOPRAMIDE HCL 5 MG/ML IJ SOLN
10.0000 mg | Freq: Once | INTRAMUSCULAR | Status: DC
Start: 2021-07-30 — End: 2021-07-30
  Filled 2021-07-30: qty 2

## 2021-07-30 NOTE — MAU Provider Note (Signed)
History     CSN: [id number]  Arrival date and time: 07/30/21 1557   Event Date/Time   First Provider Initiated Contact with Patient 07/30/21 1722      Chief Complaint  Patient presents with   Dizziness   Emesis   Nausea   Headache   Diarrhea   HPI  Ana [name] is a 31 y.o. [id number] at 46w0dwho presents for evaluation of a headache and nausea, vomiting and diarrhea. Patient reports she has had an ongoing headache for 3 days. She reports she has tried tylenol with no relief and the headache is a 10/10. She reports the headache is making the room spin.  She also reports nausea, vomiting and diarrhea. She denies any known sick contacts. She denies any vaginal bleeding, discharge, and leaking of fluid. Denies any constipation, diarrhea or any urinary complaints. Reports normal fetal movement.  OB History     Gravida  11   Para  6   Term  6   Preterm  0   AB  4   Living  6      SAB  2   IAB  2   Ectopic  0   Multiple  0   Live Births  6           Past Medical History:  Diagnosis Date   Abnormal Pap smear    Anemia    Anxiety    Asthma    Chlamydia 03/2012   Eczema    Gonorrhea    Hypertension    Kidney infection    PID (pelvic inflammatory disease)    Vaginal Pap smear, abnormal     Past Surgical History:  Procedure Laterality Date   DILATION AND CURETTAGE OF UTERUS     WISDOM TOOTH EXTRACTION      Family History  Problem Relation Age of Onset   Hypertension Mother    Heart attack Mother    Healthy Father    Hypertension Maternal Grandmother     Social History   Tobacco Use   Smoking status: Never   Smokeless tobacco: Never  Vaping Use   Vaping Use: Never used  Substance Use Topics   Alcohol use: Not Currently    Comment: occasionally   Drug use: Not Currently    Types: Marijuana    Comment: last 3/12    Allergies: No Known Allergies  Medications Prior to Admission  Medication Sig Dispense Refill Last Dose    acetaminophen (TYLENOL) 325 MG tablet Take 650 mg by mouth every 6 (six) hours as needed.   Past Week   aspirin EC 81 MG tablet Take 1 tablet (81 mg total) by mouth daily. Take after 12 weeks for prevention of preeclampsia later in pregnancy 300 tablet 2 Past Month   Prenatal Vit-Fe Phos-FA-Omega (VITAFOL GUMMIES) 3.33-0.333-34.8 MG CHEW Chew 1 tablet by mouth daily. 30 tablet 11 07/30/2021   triamcinolone (KENALOG) 0.025 % ointment Apply topically 2 (two) times daily as needed. 454 g 0 07/30/2021   Blood Pressure Monitoring (BLOOD PRESSURE KIT) DEVI 1 Device by Does not apply route as needed. 1 each 0     Review of Systems  Constitutional: Negative.  Negative for fatigue and fever.  HENT: Negative.    Respiratory: Negative.  Negative for shortness of breath.   Cardiovascular: Negative.  Negative for chest pain.  Gastrointestinal:  Positive for diarrhea, nausea and vomiting. Negative for abdominal pain and constipation.  Genitourinary: Negative.  Negative for dysuria, vaginal bleeding and  vaginal discharge.  Neurological:  Positive for headaches. Negative for dizziness.  Physical Exam   Blood pressure 120/67, pulse 76, temperature 98.7 F (37.1 C), temperature source Oral, resp. rate 16, height _0  (1.626 m), weight 89.3 kg, last menstrual period 12/11/2020, SpO2 100 %, unknown if currently breastfeeding.  Patient Vitals for the past 24 hrs:  BP Temp Temp src Pulse Resp SpO2 Height Weight  07/30/21 1720 120/67 -- -- 76 -- 100 % -- --  07/30/21 1631 (!) 121/57 98.7 F (37.1 C) Oral 72 16 100 % _1  (1.626 m) 89.3 kg    Physical Exam Vitals and nursing note reviewed.  Constitutional:      General: She is not in acute distress.    Appearance: She is well-developed.  HENT:     Head: Normocephalic.  Eyes:     Pupils: Pupils are equal, round, and reactive to light.  Cardiovascular:     Rate and Rhythm: Normal rate and regular rhythm.     Heart sounds: Normal heart sounds.   Pulmonary:     Effort: Pulmonary effort is normal. No respiratory distress.     Breath sounds: Normal breath sounds.  Abdominal:     General: Bowel sounds are normal. There is no distension.     Palpations: Abdomen is soft.     Tenderness: There is no abdominal tenderness.  Skin:    General: Skin is warm and dry.  Neurological:     Mental Status: She is alert and oriented to person, place, and time.  Psychiatric:        Mood and Affect: Mood normal.        Behavior: Behavior normal.        Thought Content: Thought content normal.        Judgment: Judgment normal.   Fetal Tracing:  Baseline: 135 Variability: moderate Accels: 15x15 Decels: none  Toco: none  MAU Course  Procedures  Results for orders placed or performed during the hospital encounter of 07/30/21 (from the past 24 hour(s))  Urinalysis, Routine w reflex microscopic     Status: Abnormal   Collection Time: 07/30/21  5:21 PM  Result Value Ref Range   Color, Urine YELLOW YELLOW   APPearance CLEAR CLEAR   Specific Gravity, Urine 1.012 1.005 - 1.030   pH 7.0 5.0 - 8.0   Glucose, UA NEGATIVE NEGATIVE mg/dL   Hgb urine dipstick NEGATIVE NEGATIVE   Bilirubin Urine NEGATIVE NEGATIVE   Ketones, ur 20 (A) NEGATIVE mg/dL   Protein, ur NEGATIVE NEGATIVE mg/dL   Nitrite NEGATIVE NEGATIVE   Leukocytes,Ua NEGATIVE NEGATIVE    MDM Labs ordered and reviewed.   UA LR Bolus, Pepcid, Reglan- IV attempted multiple times without success and patient declines any further attempts. Patient also d/c fetal monitoring herself and declined any further monitoring in MAU  Zofran ODT Pepcid PO Meclizine PO Tylenol PO  Patient states she is still nauseous and her headache is unchanged.   Flexeril PO Reglan PO  Patient requested discharge after administration of medication. Does not want to wait to reassess affect.   Assessment and Plan   1. Pregnancy headache in third trimester   2. [redacted] weeks gestation of pregnancy   3.  Viral gastroenteritis     -Discharge home in stable condition -Rx for zofran sent to pharmacy -Third trimester precautions discussed -Patient advised to follow-up with OB as scheduled for prenatal care -Patient may return to MAU as needed or if her condition were to change  or worsen  Wende Mott, [place] 07/30/2021, 8:50 PM

## 2021-07-30 NOTE — Discharge Instructions (Signed)

## 2021-07-30 NOTE — MAU Note (Incomplete)
Ana [name] is a 31 y.o. at [redacted]w[redacted]d here in MAU reporting: having this HA, this is the 3rd day, has not taken anything for HA.  Was just taken off her BP medications. Feels like a BP thing, has been dizzy. Has been nauseated, no vomiting.  Has been having diarrhea.  Every time she goes to pee, she has a BM- loose and liquid, started today. Cramping, thinks it is braxton hicks. 120's/70's at home  Onset of complaint: 3 days ago Pain score: abd 6, HA8 Vitals:   07/30/21 1631  BP: (!) 121/57  Pulse: 72  Resp: 16  Temp: 98.7 F (37.1 C)  SpO2: 100%     FHT:150 Lab orders placed from triage:  urine, contact isolation

## 2021-07-30 NOTE — MAU Note (Signed)
RN at bedside after IV team informed RN that they were unable to gain IV access. Patient pacing the room and talking loudly on the phone. Patient removed monitors and getting dressed. RN acknowledged patient's frustration on RN and IV team not gaining access. Patient voiced concerns about not being listened to or taken seriously. RN listened to patient, provided support, and talked with patient about the options for her care. RN discussed the option of IV team making a second attempt or trying PO medications. Patient kept mentioning leaving the hospital and going home. RN offered patient the option to leave, explaining that the patient has the choice and does not have to do anything that she doesn't want to. Patient continued to go back and forth, saying, "I don't feel right here. I just want to go home, but I don't want to be feeling the same way". RN offered the three options mentioned previously and asked the patient to expand on how she is feeling. After providing therapeutic communication, the patient decided to stay and try the PO medications.

## 2021-07-31 ENCOUNTER — Telehealth: Payer: Self-pay

## 2021-07-31 NOTE — Telephone Encounter (Signed)
Pt called back at her request. Spoke with pt briefly with patient stating " needing this pregnancy to be over, done being pregnant, I need yall to cut this baby out me or im going to do it myself, if not you guys will find me on the news with a suicide" I tried to ask questions, pt hung up on me.  Returned call to pt, pt answer stating " I dont know why you are calling back if you cant get this M-Fing baby out of me" then pt hung up.  Tried returning call a third time and no answer.  Addison Naegeli, RN is witness to phonecall in office.  Laney Pastor

## 2021-08-04 ENCOUNTER — Encounter (HOSPITAL_COMMUNITY): Payer: Self-pay | Admitting: Obstetrics and Gynecology

## 2021-08-04 ENCOUNTER — Inpatient Hospital Stay (HOSPITAL_COMMUNITY): Payer: Medicaid Other

## 2021-08-04 ENCOUNTER — Other Ambulatory Visit: Payer: Self-pay

## 2021-08-04 ENCOUNTER — Inpatient Hospital Stay (HOSPITAL_COMMUNITY)
Admission: AD | Admit: 2021-08-04 | Discharge: 2021-08-05 | Disposition: A | Discharge status: Home / Self Care | Payer: Medicaid Other | Attending: Obstetrics and Gynecology | Admitting: Obstetrics and Gynecology

## 2021-08-04 DIAGNOSIS — O99213 Obesity complicating pregnancy, third trimester: Secondary | ICD-10-CM | POA: Diagnosis not present

## 2021-08-04 DIAGNOSIS — O09293 Supervision of pregnancy with other poor reproductive or obstetric history, third trimester: Secondary | ICD-10-CM | POA: Insufficient documentation

## 2021-08-04 DIAGNOSIS — O10913 Unspecified pre-existing hypertension complicating pregnancy, third trimester: Secondary | ICD-10-CM | POA: Insufficient documentation

## 2021-08-04 DIAGNOSIS — R519 Headache, unspecified: Secondary | ICD-10-CM | POA: Diagnosis not present

## 2021-08-04 DIAGNOSIS — O26893 Other specified pregnancy related conditions, third trimester: Secondary | ICD-10-CM | POA: Insufficient documentation

## 2021-08-04 DIAGNOSIS — O0943 Supervision of pregnancy with grand multiparity, third trimester: Secondary | ICD-10-CM | POA: Diagnosis not present

## 2021-08-04 DIAGNOSIS — Z148 Genetic carrier of other disease: Secondary | ICD-10-CM | POA: Diagnosis not present

## 2021-08-04 DIAGNOSIS — Z3A32 32 weeks gestation of pregnancy: Secondary | ICD-10-CM | POA: Insufficient documentation

## 2021-08-04 DIAGNOSIS — D563 Thalassemia minor: Secondary | ICD-10-CM | POA: Diagnosis not present

## 2021-08-04 DIAGNOSIS — Z3689 Encounter for other specified antenatal screening: Secondary | ICD-10-CM

## 2021-08-04 DIAGNOSIS — J018 Other acute sinusitis: Secondary | ICD-10-CM

## 2021-08-04 LAB — URINALYSIS, ROUTINE W REFLEX MICROSCOPIC
Bilirubin Urine: NEGATIVE
Glucose, UA: NEGATIVE mg/dL
Hgb urine dipstick: NEGATIVE
Ketones, ur: NEGATIVE mg/dL
Leukocytes,Ua: NEGATIVE
Nitrite: NEGATIVE
Protein, ur: NEGATIVE mg/dL
Specific Gravity, Urine: 1.021 (ref 1.005–1.030)
pH: 6 (ref 5.0–8.0)

## 2021-08-04 LAB — CBC
HCT: 37.2 % (ref 36.0–46.0)
Hemoglobin: 12 g/dL (ref 12.0–15.0)
MCH: 28.8 pg (ref 26.0–34.0)
MCHC: 32.3 g/dL (ref 30.0–36.0)
MCV: 89.4 fL (ref 80.0–100.0)
Platelets: 234 10*3/uL (ref 150–400)
RBC: 4.16 MIL/uL (ref 3.87–5.11)
RDW: 12.6 % (ref 11.5–15.5)
WBC: 13.5 10*3/uL — ABNORMAL HIGH (ref 4.0–10.5)
nRBC: 0 % (ref 0.0–0.2)

## 2021-08-04 LAB — COMPREHENSIVE METABOLIC PANEL
ALT: 17 U/L (ref 0–44)
AST: 20 U/L (ref 15–41)
Albumin: 3.4 g/dL — ABNORMAL LOW (ref 3.5–5.0)
Alkaline Phosphatase: 108 U/L (ref 38–126)
Anion gap: 8 (ref 5–15)
BUN: 7 mg/dL (ref 6–20)
CO2: 23 mmol/L (ref 22–32)
Calcium: 8.8 mg/dL — ABNORMAL LOW (ref 8.9–10.3)
Chloride: 103 mmol/L (ref 98–111)
Creatinine, Ser: 0.67 mg/dL (ref 0.44–1.00)
GFR, Estimated: >60 mL/min (ref >60)
Glucose, Bld: 77 mg/dL (ref 70–99)
Potassium: 3.4 mmol/L — ABNORMAL LOW (ref 3.5–5.1)
Sodium: 134 mmol/L — ABNORMAL LOW (ref 135–145)
Total Bilirubin: 0.4 mg/dL (ref 0.3–1.2)
Total Protein: 7.1 g/dL (ref 6.5–8.1)

## 2021-08-04 LAB — PROTEIN / CREATININE RATIO, URINE
Creatinine, Urine: 282.91 mg/dL
Protein Creatinine Ratio: 0.05 mg/mg{Cre} (ref 0.00–0.15)
Total Protein, Urine: 14 mg/dL

## 2021-08-04 MED ORDER — DEXAMETHASONE SODIUM PHOSPHATE 10 MG/ML IJ SOLN
10.0000 mg | Freq: Once | INTRAMUSCULAR | Status: AC
  Administered 2021-08-04: 10 mg via INTRAVENOUS
  Filled 2021-08-04: qty 1

## 2021-08-04 MED ORDER — IOHEXOL 300 MG/ML  SOLN
75.0000 mL | Freq: Once | INTRAMUSCULAR | Status: AC | PRN
  Administered 2021-08-04: 75 mL via INTRAVENOUS

## 2021-08-04 MED ORDER — LORATADINE 10 MG PO TABS
10.0000 mg | ORAL_TABLET | Freq: Every day | ORAL | 4 refills | Status: DC
Start: 2021-08-04 — End: 2021-08-18

## 2021-08-04 MED ORDER — AMOXICILLIN 500 MG PO CAPS
500.0000 mg | ORAL_CAPSULE | Freq: Three times a day (TID) | ORAL | 0 refills | Status: DC
Start: 2021-08-04 — End: 2021-08-18

## 2021-08-04 MED ORDER — DIPHENHYDRAMINE HCL 50 MG/ML IJ SOLN
25.0000 mg | Freq: Once | INTRAMUSCULAR | Status: AC
Start: 2021-08-04 — End: 2021-08-04
  Administered 2021-08-04: 25 mg via INTRAVENOUS
  Filled 2021-08-04: qty 1

## 2021-08-04 MED ORDER — METOCLOPRAMIDE HCL 5 MG/ML IJ SOLN
10.0000 mg | Freq: Once | INTRAMUSCULAR | Status: AC
  Administered 2021-08-04: 10 mg via INTRAVENOUS
  Filled 2021-08-04: qty 2

## 2021-08-04 MED ORDER — LACTATED RINGERS IV BOLUS
1000.0000 mL | Freq: Once | INTRAVENOUS | Status: AC
  Administered 2021-08-04: 1000 mL via INTRAVENOUS

## 2021-08-04 NOTE — MAU Provider Note (Incomplete Revision)
History     CSN: [id number]  Arrival date and time: 08/04/21 1950   Event Date/Time   First Provider Initiated Contact with Patient 08/04/21 2103      Chief Complaint  Patient presents with   Headache   Abdominal Pain   31 y.o. [id number] _0 .5 wks presenting with HA and cramping. Reports onset of HA 8 days ago. HA is generalized but worse on her right side and worse when she lies down. Rates pain 10/10. She has tried Tylenol and increased water intake without relief. She also feels popping in her ears. Reports seeing floaters when she stands. Denies N/V. Endorse bilateral leg weakness but this sx has been ongoing for weeks and she is in PT. Cramping feels like BH ctx. Pain is intermittent but worse today. Reports >5 ctx in an hour. Denies VB or LOF. Reports good FM. Of note she was seen in MAU 5 days ago for HA and states she did not get relief from the meds given.   OB History     Gravida  11   Para  6   Term  6   Preterm  0   AB  4   Living  6      SAB  2   IAB  2   Ectopic  0   Multiple  0   Live Births  6           Past Medical History:  Diagnosis Date   Abnormal Pap smear    Anemia    Anxiety    Asthma    Chlamydia 03/2012   Eczema    Gonorrhea    Hypertension    Kidney infection    PID (pelvic inflammatory disease)    Vaginal Pap smear, abnormal     Past Surgical History:  Procedure Laterality Date   DILATION AND CURETTAGE OF UTERUS     WISDOM TOOTH EXTRACTION      Family History  Problem Relation Age of Onset   Hypertension Mother    Heart attack Mother    Healthy Father    Hypertension Maternal Grandmother     Social History   Tobacco Use   Smoking status: Never   Smokeless tobacco: Never  Vaping Use   Vaping Use: Never used  Substance Use Topics   Alcohol use: Not Currently    Comment: occasionally   Drug use: Not Currently    Types: Marijuana    Comment: last 3/12    Allergies: No Known Allergies  Medications  Prior to Admission  Medication Sig Dispense Refill Last Dose   acetaminophen (TYLENOL) 325 MG tablet Take 650 mg by mouth every 6 (six) hours as needed.   08/04/2021   Prenatal Vit-Fe Phos-FA-Omega (VITAFOL GUMMIES) 3.33-0.333-34.8 MG CHEW Chew 1 tablet by mouth daily. 30 tablet 11 08/03/2021   aspirin EC 81 MG tablet Take 1 tablet (81 mg total) by mouth daily. Take after 12 weeks for prevention of preeclampsia later in pregnancy 300 tablet 2    Blood Pressure Monitoring (BLOOD PRESSURE KIT) DEVI 1 Device by Does not apply route as needed. 1 each 0    ondansetron (ZOFRAN-ODT) 8 MG disintegrating tablet Take 1 tablet (8 mg total) by mouth every 8 (eight) hours as needed for nausea or vomiting. 20 tablet 0    triamcinolone (KENALOG) 0.025 % ointment Apply topically 2 (two) times daily as needed. 454 g 0     Review of Systems  Eyes:  Positive for visual  disturbance.  Gastrointestinal:  Negative for abdominal pain.  Genitourinary:  Negative for vaginal bleeding and vaginal discharge.  Neurological:  Positive for light-headedness and headaches.  Physical Exam   Blood pressure 124/71, temperature 98.6 F (37 C), temperature source Oral, resp. rate 18, height $RemoveBe'5\' 4"'sHbNEkwWR$  (1.626 m), weight 88.7 kg, last menstrual period 12/11/2020, SpO2 100 %, unknown if currently breastfeeding. Patient Vitals for the past 24 hrs:  BP Temp Temp src Resp SpO2 Height Weight  08/04/21 2014 124/71 98.6 F (37 C) Oral 18 100 % $Rem'5\' 4"'gYSX$  (1.626 m) 88.7 kg   Physical Exam Vitals and nursing note reviewed. Exam conducted with a chaperone present.  Constitutional:      General: She is not in acute distress.    Appearance: Normal appearance.  HENT:     Head: Normocephalic and atraumatic.  Cardiovascular:     Rate and Rhythm: Normal rate.  Pulmonary:     Effort: Pulmonary effort is normal. No respiratory distress.  Abdominal:     Palpations: Abdomen is soft.     Tenderness: There is no abdominal tenderness.     Comments:  gravid  Musculoskeletal:        General: Normal range of motion.     Cervical back: Normal range of motion.  Skin:    General: Skin is warm and dry.  Neurological:     General: No focal deficit present.     Mental Status: She is alert and oriented to person, place, and time.     Cranial Nerves: No cranial nerve deficit.     Motor: No weakness.  Psychiatric:        Mood and Affect: Mood normal.        Behavior: Behavior normal.  EFM: 145 bpm, mod variability, + accels, no decels Toco: none  Results for orders placed or performed during the hospital encounter of 08/04/21 (from the past 24 hour(s))  Urinalysis, Routine w reflex microscopic     Status: None   Collection Time: 08/04/21  8:25 PM  Result Value Ref Range   Color, Urine YELLOW YELLOW   APPearance CLEAR CLEAR   Specific Gravity, Urine 1.021 1.005 - 1.030   pH 6.0 5.0 - 8.0   Glucose, UA NEGATIVE NEGATIVE mg/dL   Hgb urine dipstick NEGATIVE NEGATIVE   Bilirubin Urine NEGATIVE NEGATIVE   Ketones, ur NEGATIVE NEGATIVE mg/dL   Protein, ur NEGATIVE NEGATIVE mg/dL   Nitrite NEGATIVE NEGATIVE   Leukocytes,Ua NEGATIVE NEGATIVE  CBC     Status: Abnormal   Collection Time: 08/04/21  9:03 PM  Result Value Ref Range   WBC 13.5 (H) 4.0 - 10.5 K/uL   RBC 4.16 3.87 - 5.11 MIL/uL   Hemoglobin 12.0 12.0 - 15.0 g/dL   HCT 37.2 36.0 - 46.0 %   MCV 89.4 80.0 - 100.0 fL   MCH 28.8 26.0 - 34.0 pg   MCHC 32.3 30.0 - 36.0 g/dL   RDW 12.6 11.5 - 15.5 %   Platelets 234 150 - 400 K/uL   nRBC 0.0 0.0 - 0.2 %   MAU Course  Procedures LR Decadron Reglan Benadryl  MDM Labs and imaging ordered. Prenatal records reviewed: pregnancy is complicated by [facility] (no meds), hx abruption in previous pregnancy, poly,  grand multip, SMA carrier, alpha thal carrier, obesity. Transfer of care given to [place], [place]  08/04/2021 9:42 PM   Assessment and Plan  Reassessment (11:27 PM) Patient resting in bed. Reports HA is  improving  and now rates 5/10. CT results pending Patient denies current needs or concerns.  Will continue to monitor and reassess.

## 2021-08-04 NOTE — MAU Note (Signed)
.  Ana [name] is a 31 y.o. at [redacted]w[redacted]d here in MAU reporting: HA that has been constant since last visit to MAU last week.Pt has been monitoring BP at home, and all have been good, she has also increased her water intake and tried to maintain a low sim environment. Pt states she has tried everything to help her HA. Pt has taken Tylenol 1000mg  this am,, with no relief. Pt stated the HA is worst laying down, and her ear start to pop. Pt report ABD cramping, but believes it just [facility]. Pt denies DFM, VB, LOF, PIH s/s, and abnormal discharge.   Onset of complaint: ongoing Pain score: 10/10 HA, 4 ABD cramping Vitals:   08/04/21 2014  BP: 124/71  Resp: 18  Temp: 98.6 F (37 C)  SpO2: 100%     FHT:156  Lab orders placed from triage:  UA

## 2021-08-04 NOTE — MAU Provider Note (Cosign Needed)
History     CSN: [id number]  Arrival date and time: 08/04/21 1950   Event Date/Time   First Provider Initiated Contact with Patient 08/04/21 2103      Chief Complaint  Patient presents with   Headache   Abdominal Pain   31 y.o. [id number] _0 .5 wks presenting with HA and cramping. Reports onset of HA 8 days ago. HA is generalized but worse on her right side and worse when she lies down. Rates pain 10/10. She has tried Tylenol and increased water intake without relief. She also feels popping in her ears. Reports seeing floaters when she stands. Denies N/V. Endorse bilateral leg weakness but this sx has been ongoing for weeks and she is in PT. Cramping feels like BH ctx. Pain is intermittent but worse today. Reports >5 ctx in an hour. Denies VB or LOF. Reports good FM. Of note she was seen in MAU 5 days ago for HA and states she did not get relief from the meds given.   OB History     Gravida  11   Para  6   Term  6   Preterm  0   AB  4   Living  6      SAB  2   IAB  2   Ectopic  0   Multiple  0   Live Births  6           Past Medical History:  Diagnosis Date   Abnormal Pap smear    Anemia    Anxiety    Asthma    Chlamydia 03/2012   Eczema    Gonorrhea    Hypertension    Kidney infection    PID (pelvic inflammatory disease)    Vaginal Pap smear, abnormal     Past Surgical History:  Procedure Laterality Date   DILATION AND CURETTAGE OF UTERUS     WISDOM TOOTH EXTRACTION      Family History  Problem Relation Age of Onset   Hypertension Mother    Heart attack Mother    Healthy Father    Hypertension Maternal Grandmother     Social History   Tobacco Use   Smoking status: Never   Smokeless tobacco: Never  Vaping Use   Vaping Use: Never used  Substance Use Topics   Alcohol use: Not Currently    Comment: occasionally   Drug use: Not Currently    Types: Marijuana    Comment: last 3/12    Allergies: No Known Allergies  Medications  Prior to Admission  Medication Sig Dispense Refill Last Dose   acetaminophen (TYLENOL) 325 MG tablet Take 650 mg by mouth every 6 (six) hours as needed.   08/04/2021   Prenatal Vit-Fe Phos-FA-Omega (VITAFOL GUMMIES) 3.33-0.333-34.8 MG CHEW Chew 1 tablet by mouth daily. 30 tablet 11 08/03/2021   aspirin EC 81 MG tablet Take 1 tablet (81 mg total) by mouth daily. Take after 12 weeks for prevention of preeclampsia later in pregnancy 300 tablet 2    Blood Pressure Monitoring (BLOOD PRESSURE KIT) DEVI 1 Device by Does not apply route as needed. 1 each 0    ondansetron (ZOFRAN-ODT) 8 MG disintegrating tablet Take 1 tablet (8 mg total) by mouth every 8 (eight) hours as needed for nausea or vomiting. 20 tablet 0    triamcinolone (KENALOG) 0.025 % ointment Apply topically 2 (two) times daily as needed. 454 g 0     Review of Systems  Eyes:  Positive for visual  disturbance.  Gastrointestinal:  Negative for abdominal pain.  Genitourinary:  Negative for vaginal bleeding and vaginal discharge.  Neurological:  Positive for light-headedness and headaches.  Physical Exam   Blood pressure 124/71, temperature 98.6 F (37 C), temperature source Oral, resp. rate 18, height _0  (1.626 m), weight 88.7 kg, last menstrual period 12/11/2020, SpO2 100 %, unknown if currently breastfeeding. Patient Vitals for the past 24 hrs:  BP Temp Temp src Resp SpO2 Height Weight  08/04/21 2014 124/71 98.6 F (37 C) Oral 18 100 % _1  (1.626 m) 88.7 kg   Physical Exam Vitals and nursing note reviewed. Exam conducted with a chaperone present.  Constitutional:      General: She is not in acute distress.    Appearance: Normal appearance.  HENT:     Head: Normocephalic and atraumatic.  Cardiovascular:     Rate and Rhythm: Normal rate.  Pulmonary:     Effort: Pulmonary effort is normal. No respiratory distress.  Abdominal:     Palpations: Abdomen is soft.     Tenderness: There is no abdominal tenderness.     Comments:  gravid  Musculoskeletal:        General: Normal range of motion.     Cervical back: Normal range of motion.  Skin:    General: Skin is warm and dry.  Neurological:     General: No focal deficit present.     Mental Status: She is alert and oriented to person, place, and time.     Cranial Nerves: No cranial nerve deficit.     Motor: No weakness.  Psychiatric:        Mood and Affect: Mood normal.        Behavior: Behavior normal.  EFM: 145 bpm, mod variability, + accels, no decels Toco: none  Results for orders placed or performed during the hospital encounter of 08/04/21 (from the past 24 hour(s))  Urinalysis, Routine w reflex microscopic     Status: None   Collection Time: 08/04/21  8:25 PM  Result Value Ref Range   Color, Urine YELLOW YELLOW   APPearance CLEAR CLEAR   Specific Gravity, Urine 1.021 1.005 - 1.030   pH 6.0 5.0 - 8.0   Glucose, UA NEGATIVE NEGATIVE mg/dL   Hgb urine dipstick NEGATIVE NEGATIVE   Bilirubin Urine NEGATIVE NEGATIVE   Ketones, ur NEGATIVE NEGATIVE mg/dL   Protein, ur NEGATIVE NEGATIVE mg/dL   Nitrite NEGATIVE NEGATIVE   Leukocytes,Ua NEGATIVE NEGATIVE  CBC     Status: Abnormal   Collection Time: 08/04/21  9:03 PM  Result Value Ref Range   WBC 13.5 (H) 4.0 - 10.5 K/uL   RBC 4.16 3.87 - 5.11 MIL/uL   Hemoglobin 12.0 12.0 - 15.0 g/dL   HCT 37.2 36.0 - 46.0 %   MCV 89.4 80.0 - 100.0 fL   MCH 28.8 26.0 - 34.0 pg   MCHC 32.3 30.0 - 36.0 g/dL   RDW 12.6 11.5 - 15.5 %   Platelets 234 150 - 400 K/uL   nRBC 0.0 0.0 - 0.2 %   MAU Course  Procedures LR Decadron Reglan Benadryl  MDM Labs and imaging ordered. Prenatal records reviewed: pregnancy is complicated by [facility] (no meds), hx abruption in previous pregnancy, poly,  grand multip, SMA carrier, alpha thal carrier, obesity. Transfer of care given to [place], [place]  08/04/2021 9:42 PM   Assessment and Plan

## 2021-08-05 ENCOUNTER — Ambulatory Visit: Payer: Medicaid Other | Admitting: *Deleted

## 2021-08-05 ENCOUNTER — Encounter: Payer: Self-pay | Admitting: *Deleted

## 2021-08-05 ENCOUNTER — Ambulatory Visit: Payer: Medicaid Other | Attending: Obstetrics | Admitting: *Deleted

## 2021-08-05 VITALS — BP 122/68 | HR 101

## 2021-08-05 DIAGNOSIS — D563 Thalassemia minor: Secondary | ICD-10-CM

## 2021-08-05 DIAGNOSIS — Z148 Genetic carrier of other disease: Secondary | ICD-10-CM

## 2021-08-05 DIAGNOSIS — O10913 Unspecified pre-existing hypertension complicating pregnancy, third trimester: Secondary | ICD-10-CM | POA: Diagnosis not present

## 2021-08-05 DIAGNOSIS — O403XX0 Polyhydramnios, third trimester, not applicable or unspecified: Secondary | ICD-10-CM

## 2021-08-05 DIAGNOSIS — Z362 Encounter for other antenatal screening follow-up: Secondary | ICD-10-CM | POA: Diagnosis present

## 2021-08-05 DIAGNOSIS — R87810 Cervical high risk human papillomavirus (HPV) DNA test positive: Secondary | ICD-10-CM

## 2021-08-05 DIAGNOSIS — O0943 Supervision of pregnancy with grand multiparity, third trimester: Secondary | ICD-10-CM

## 2021-08-05 DIAGNOSIS — Z3A32 32 weeks gestation of pregnancy: Secondary | ICD-10-CM | POA: Diagnosis not present

## 2021-08-05 DIAGNOSIS — O0990 Supervision of high risk pregnancy, unspecified, unspecified trimester: Secondary | ICD-10-CM

## 2021-08-05 NOTE — Procedures (Signed)
Ana [name] 09-09-1990 [redacted]w[redacted]d  Fetus A Non-Stress Test Interpretation for 08/05/21  Indication: Chronic Hypertenstion  Fetal Heart Rate A Mode: External Baseline Rate (A): 145 bpm Variability: Moderate Accelerations: 15 x 15 Decelerations: None Multiple birth?: No  Uterine Activity Mode: Toco Contraction Frequency (min): rare Contraction Duration (sec): 60 Contraction Quality: Mild Resting Tone Palpated: Relaxed  Interpretation (Fetal Testing) Nonstress Test Interpretation: Reactive Overall Impression: Reassuring for gestational age Comments: tracing reviewed by DR. Judeth Cornfield

## 2021-08-07 NOTE — BH Specialist Note (Signed)
Integrated Behavioral Health via Telemedicine Visit  08/13/2021 Ana [name] [id number]  Number of [place] Clinician visits: 1- Initial Visit  Session Start time: [id number]   Session End time: [id number]  Total time in minutes: 30   Referring Provider: Emeterio Reeve, MD Patient/Family location: [facility] Provider location: [facility]  All persons participating in visit: [name] and [place]   Types of Service: Individual psychotherapy and Video visit  I connected with [name] and/or [name]  n/a  via  Telephone or Video Enabled Telemedicine Application  (Video is Caregility application) and verified that I am speaking with the correct person using two identifiers. Discussed confidentiality: Yes   I discussed the limitations of telemedicine and the availability of in person appointments.  Discussed there is a possibility of technology failure and discussed alternative modes of communication if that failure occurs.  I discussed that engaging in this telemedicine visit, they consent to the provision of behavioral healthcare and the services will be billed under their insurance.  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 Pt goals are a healthy pregnancy and find full-time work postpartum.  Duration of problem: Current pregnancy; Severity of problem:  moderately severe  Patient and/or Family's Strengths/Protective Factors: Concrete supports in place (healthy food, safe environments,  etc.), Sense of purpose, and Physical Health (exercise, healthy diet, medication compliance, etc.)  Goals Addressed: Patient will:  Reduce symptoms of: anxiety, depression, and stress   Increase knowledge and/or ability of: healthy habits   Demonstrate ability to: Increase healthy adjustment to current life circumstances  Progress towards Goals: Ongoing  Interventions: Interventions utilized:  Solution-Focused Strategies, Psychoeducation and/or Health Education, and Link to [employer] Standardized Assessments completed: GAD-7 and PHQ 9  Patient and/or Family Response: Patient agrees with treatment plan.   Assessment: Patient currently experiencing Adjustment disorder with mixed anxious and depressed mood and Psychosocial stress.   Patient may benefit from psychoeducation and brief therapeutic interventions regarding coping with symptoms of depression, anxiety, life stress .  Plan: Follow up with behavioral health clinician on : Two weeks Behavioral recommendations:  -Continue taking prenatal vitamins and nausea medication as prescribed -Continue prioritizing healthy sleep remaining pregnancy -Continue plan for hair appointment this week for self-care -Consider additional resources on After Visit Summary as needed Referral(s): [facility] (In Clinic) and [employer]:  childcare  I discussed the assessment and treatment plan with the patient and/or parent/guardian. They were provided an opportunity to ask questions and all were answered. They agreed with the plan and demonstrated an understanding of the instructions.   They were advised to call back or seek an in-person evaluation if the symptoms worsen or if the condition fails to improve as anticipated.  [name], LCSW     08/13/2021   10:25 AM 05/23/2021    9:07 AM 04/25/2021   11:32 AM 03/28/2021   11:53 AM 02/28/2021   10:20 AM  Depression screen PHQ 2/9  Decreased  Interest 1 1 1 1 2   Down, Depressed, Hopeless 1 1 1  0 1  PHQ - 2 Score 2 2 2 1 3   Altered  sleeping 3 2 2  0 1  Tired, decreased energy 3 2 2 2 1   Change in appetite 3 2 2 1 1   Feeling bad or failure about yourself  1 1 2  0 1  Trouble concentrating 2 1 2  0 0  Moving slowly or fidgety/restless 2 0 1 0 0  Suicidal thoughts 0 0 0 0 0  PHQ-9 Score 16 10 13 4 7       08/13/2021   10:27 AM 05/23/2021    9:07 AM 04/25/2021   11:32 AM 03/28/2021   11:54 AM  GAD 7 : Generalized Anxiety Score  Nervous, Anxious, on Edge 3 1 2  0  Control/stop worrying 3 1 2 2   Worry too much - different things 3 1 2 2   Trouble relaxing 3 1 2 2   Restless 1 1 1  0  Easily annoyed or irritable 3 1 3 1   Afraid - awful might happen 3 0 3 1  Total GAD 7 Score 19 6 15  8

## 2021-08-08 ENCOUNTER — Other Ambulatory Visit: Payer: Self-pay

## 2021-08-12 ENCOUNTER — Ambulatory Visit: Payer: Medicaid Other | Attending: Obstetrics

## 2021-08-12 ENCOUNTER — Other Ambulatory Visit: Payer: Self-pay | Admitting: Obstetrics

## 2021-08-12 ENCOUNTER — Ambulatory Visit: Payer: Medicaid Other | Admitting: *Deleted

## 2021-08-12 ENCOUNTER — Other Ambulatory Visit: Payer: Self-pay | Admitting: *Deleted

## 2021-08-12 VITALS — BP 122/60 | HR 88

## 2021-08-12 DIAGNOSIS — R87810 Cervical high risk human papillomavirus (HPV) DNA test positive: Secondary | ICD-10-CM

## 2021-08-12 DIAGNOSIS — O10919 Unspecified pre-existing hypertension complicating pregnancy, unspecified trimester: Secondary | ICD-10-CM | POA: Diagnosis not present

## 2021-08-12 DIAGNOSIS — O0990 Supervision of high risk pregnancy, unspecified, unspecified trimester: Secondary | ICD-10-CM | POA: Insufficient documentation

## 2021-08-12 DIAGNOSIS — O365990 Maternal care for other known or suspected poor fetal growth, unspecified trimester, not applicable or unspecified: Secondary | ICD-10-CM

## 2021-08-12 DIAGNOSIS — Z3A33 33 weeks gestation of pregnancy: Secondary | ICD-10-CM

## 2021-08-12 DIAGNOSIS — D563 Thalassemia minor: Secondary | ICD-10-CM | POA: Diagnosis present

## 2021-08-12 DIAGNOSIS — O0943 Supervision of pregnancy with grand multiparity, third trimester: Secondary | ICD-10-CM

## 2021-08-12 DIAGNOSIS — Z148 Genetic carrier of other disease: Secondary | ICD-10-CM | POA: Insufficient documentation

## 2021-08-12 DIAGNOSIS — O285 Abnormal chromosomal and genetic finding on antenatal screening of mother: Secondary | ICD-10-CM

## 2021-08-12 DIAGNOSIS — O403XX0 Polyhydramnios, third trimester, not applicable or unspecified: Secondary | ICD-10-CM

## 2021-08-12 DIAGNOSIS — O10013 Pre-existing essential hypertension complicating pregnancy, third trimester: Secondary | ICD-10-CM

## 2021-08-12 DIAGNOSIS — O09213 Supervision of pregnancy with history of pre-term labor, third trimester: Secondary | ICD-10-CM

## 2021-08-13 ENCOUNTER — Ambulatory Visit (INDEPENDENT_AMBULATORY_CARE_PROVIDER_SITE_OTHER): Payer: Medicaid Other | Admitting: Clinical

## 2021-08-13 DIAGNOSIS — F4323 Adjustment disorder with mixed anxiety and depressed mood: Secondary | ICD-10-CM

## 2021-08-13 DIAGNOSIS — Z658 Other specified problems related to psychosocial circumstances: Secondary | ICD-10-CM

## 2021-08-13 NOTE — Patient Instructions (Addendum)
Center for [facility] [place], [place] [address] [phone] (main office) [phone] ([place] office)   [place] Child Psychologist, counselling  (Childcare options, Early childcare development, etc.) PopTick.no  New Parent Support Groups www.postpartum.net www.conehealthybaby.com  [employer]IE[employer] ([place]) ReportZoo.com.cy  [employer] ([place]) http://cameron-bishop.biz/     BRAINSTORMING  Develop a Plan Goals: Provide a way to start conversation about your new life with a baby Assist parents in recognizing and using resources within their reach Help pave the way before birth for an easier period of transition afterwards.  Make a list of the following information to keep in a central location: Full name of Mom and Partner: _____________________________________________ 80 full name and Date of Birth: ___________________________________________ Home Address: ___________________________________________________________ ________________________________________________________________________ Home Phone: ____________________________________________________________ Parents' cell numbers: _____________________________________________________ ________________________________________________________________________ Name and contact info for OB: ______________________________________________ Name and contact info for Pediatrician:________________________________________ Contact info for Lactation Consultants: ________________________________________  REST and SLEEP *You each need at least 4-5 hours of uninterrupted sleep every day. Write specific names and contact information.* How are you  going to rest in the postpartum period? While partner's home? When partner returns to work? When you both return to work? Where will your baby sleep? Who is available to help during the day? Evening? Night? Who could move in for a period to help support you? What are some ideas to help you get enough sleep? __________________________________________________________________________________________________________________________________________________________________________________________________________________________________________ NUTRITIOUS FOOD AND DRINK *Plan for meals before your baby is born so you can have healthy food to eat during the immediate postpartum period.* Who will look after breakfast? Lunch? Dinner? List names and contact information. Brainstorm quick, healthy ideas for each meal. What can you do before baby is born to prepare meals for the postpartum period? How can others help you with meals? Which grocery stores provide online shopping and delivery? Which restaurants offer take-out or delivery options? ______________________________________________________________________________________________________________________________________________________________________________________________________________________________________________________________________________________________________________________________________________________________________________________________________  CARE FOR MOM *It's important that mom is cared for and pampered in the postpartum period. Remember, the most important ways new mothers need care are: sleep, nutrition, gentle exercise, and time off.* Who can come take care of mom during this period? Make a list of people with their contact information. List some activities that make you feel cared for, rested, and energized? Who can make sure you have opportunities to do these things? Does mom have a space of her very own within your  home that's just for her? Make a "[facility]" where she can be comfortable, rest, and renew herself daily. ______________________________________________________________________________________________________________________________________________________________________________________________________________________________________________________________________________________________________________________________________________________________________________________________________    CARE FOR AND FEEDING BABY *Knowledgeable and encouraging people will offer the best support with regard to feeding your baby.* Educate yourself and choose the best feeding option for your baby. Make a list of people who will guide, support, and be a resource for you as your care for and feed your baby. (Friends that have breastfed or are currently breastfeeding, lactation consultants, breastfeeding support groups, etc.) Consider a postpartum doula. (These websites can give you information: dona.org & BuyingShow.es) Seek out local breastfeeding resources like the breastfeeding support group at [employer] or [employer]. ______________________________________________________________________________________________________________________________________________________________________________________________________________________________________________________________________________________________________________________________________________________________________________________________________  Verner Chol AND ERRANDS Who can help with a thorough cleaning before baby is born? Make a list of people who will help with housekeeping and chores, like laundry, light cleaning, dishes, bathrooms, etc. Who can run some errands for you? What can you do to make sure you are stocked with basic supplies before baby is born? Who is  going to do the  shopping? ______________________________________________________________________________________________________________________________________________________________________________________________________________________________________________________________________________________________________________________________________________________________________________________________________     Family Adjustment *Nurture yourselves.it helps parents be more loving and allows for better bonding with their child.* What sorts of things do you and partner enjoy doing together? Which activities help you to connect and strengthen your relationship? Make a list of those things. Make a list of people whom you trust to care for your baby so you can have some time together as a couple. What types of things help partner feel connected to Mom? Make a list. What needs will partner have in order to bond with baby? Other children? Who will care for them when you go into labor and while you are in the hospital? Think about what the needs of your older children might be. Who can help you meet those needs? In what ways are you helping them prepare for bringing baby home? List some specific strategies you have for family adjustment. _______________________________________________________________________________________________________________________________________________________________________________________________________________________________________________________________________________________________________________________________________________  SUPPORT *Someone who can empathize with experiences normalizes your problems and makes them more bearable.* Make a list of other friends, neighbors, and/or co-workers you know with infants (and small children, if applicable) with whom you can connect. Make a list of local or online support groups, mom groups, etc. in which you can be  involved. ______________________________________________________________________________________________________________________________________________________________________________________________________________________________________________________________________________________________________________________________________________________________________________________________________  Childcare Plans Investigate and plan for childcare if mom is returning to work. Talk about mom's concerns about her transition back to work. Talk about partner's concerns regarding this transition.  Mental Health *Your mental health is one of the highest priorities for a pregnant or postpartum mom.* 1 in 5 women experience anxiety and/or depression from the time of conception through the first year after birth. Postpartum Mood Disorders are the #1 complication of pregnancy and childbirth and the suffering experienced by these mothers is not necessary! These illnesses are temporary and respond well to treatment, which often includes self-care, social support, talk therapy, and medication when needed. Women experiencing anxiety and depression often say things like: "I'm supposed to be happy.why do I feel so sad?", "Why can't I snap out of it?", "I'm having thoughts that scare me." There is no need to be embarrassed if you are feeling these symptoms: Overwhelmed, anxious, angry, sad, guilty, irritable, hopeless, exhausted but can't sleep You are NOT alone. You are NOT to blame. With help, you WILL be well. Where can I find help? Medical professionals such as your OB, midwife, gynecologist, family practitioner, primary care provider, pediatrician, or mental health providers; [facility] support groups: Feelings After Birth, Breastfeeding Support Group, Baby and Me Group, and Fit 4 Two exercise classes. You have permission to ask for help. It will confirm your feelings, validate your experiences,  share/learn coping strategies, and gain support and encouragement as you heal. You are important! BRAINSTORM Make a list of local resources, including resources for mom and for partner. Identify support groups. Identify people to call late at night - include names and contact info. Talk with partner about perinatal mood and anxiety disorders. Talk with your OB, midwife, and doula about baby blues and about perinatal mood and anxiety disorders. Talk with your pediatrician about perinatal mood and anxiety disorders.   Support & Sanity Savers   What do you really need?  Basics In preparing for a new baby, many expectant parents spend hours shopping for baby clothes, decorating the nursery, and deciding which car seat to buy. Yet most don't think much about what the reality of parenting a newborn will be like, and what they need   to make it through that. So, here is the advice of experienced parents. We know you'll read this, and think "they're exaggerating, I don't really need that." Just trust [place] on these, OK? Plan for all of this, and if it turns out you don't need it, come back and teach [place] how you did it!  Must-Haves (Once baby's survival needs are met, make sure you attend to your own survival needs!) Sleep An average newborn sleeps 16-18 hours per day, over 6-7 sleep periods, rarely more than three hours at a time. It is normal and healthy for a newborn to wake throughout the night... but really hard on parents!! Naps. Prioritize sleep above any responsibilities like: cleaning house, visiting friends, running errands, etc.  Sleep whenever baby sleeps. If you can't nap, at least have restful times when baby eats. The more rest you get, the more patient you will be, the more emotionally stable, and better at solving problems.  Food You may not have realized it would be difficult to eat when you have a newborn. Yet, when we talk to countless new parents, they say things like "it may be 2:00 pm  when I realize I haven't had breakfast yet." Or "every time we sit down to dinner, baby needs to eat, and my food gets cold, so I don't bother to eat it." Finger food. Before your baby is born, stock up with one months' worth of food that: 1) you can eat with one hand while holding a baby, 2) doesn't need to be prepped, 3) is good hot or cold, 4) doesn't spoil when left out for a few hours, and 5) you like to eat. Think about: nuts, dried fruit, Clif bars, pretzels, jerky, gogurt, baby carrots, apples, bananas, crackers, cheez-n-crackers, string cheese, hot pockets or frozen burritos to microwave, garden burgers and breakfast pastries to put in the toaster, yogurt drinks, etc. Restaurant Menus. Make lists of your favorite restaurants & menu items. When family/friends want to help, you can give specific information without much thought. They can either bring you the food or send gift cards for just the right meals. Freezer Meals.  Take some time to make a few meals to put in the freezer ahead of time.  Easy to freeze meals can be anything such as soup, lasagna, chicken pie, or spaghetti sauce. Set up a Meal Schedule.  Ask friends and family to sign up to bring you meals during the first few weeks of being home. (It can be passed around at baby showers!) You have no idea how helpful this will be until you are in the throes of parenting.  https://hamilton-woodard.com/ is a great website to check out. Emotional Support Know who to call when you're stressed out. Parenting a newborn is very challenging work. There are times when it totally overwhelms your normal coping abilities. EVERY NEW PARENT NEEDS TO HAVE A PLAN FOR WHO TO CALL WHEN THEY JUST CAN'T COPE ANY MORE. (And it has to be someone other than the baby's other parent!) Before your baby is born, come up with at least one person you can call for support - write their phone number down and post it on the refrigerator. Anxiety & Sadness. Baby blues are normal after  pregnancy; however, there are more severe types of anxiety & sadness which can occur and should not be ignored.  They are always treatable, but you have to take the first step by reaching out for help. [facility] offers a "Mom Talk" group  which meets every Tuesday from 10 am - 11 am.  This group is for new moms who need support and connection after their babies are born.  Call [phone].  Really, Really Helpful (Plan for them! Make sure these happen often!!) Physical Support with Taking Care of Yourselves Asking friends and family. Before your baby is born, set up a schedule of people who can come and visit and help out (or ask a friend to schedule for you). Any time someone says "let me know what I can do to help," sign them up for a day. When they get there, their job is not to take care of the baby (that's your job and your joy). Their job is to take care of you!  Postpartum doulas. If you don't have anyone you can call on for support, look into postpartum doulas:  professionals at helping parents with caring for baby, caring for themselves, getting breastfeeding started, and helping with household tasks. www.padanc.org is a helpful website for learning about doulas in our area. Peer Support / Parent Groups Why: One of the greatest ideas for new parents is to be around other new parents. Parent groups give you a chance to share and listen to others who are going through the same season of life, get a sense of what is normal infant development by watching several babies learn and grow, share your stories of triumph and struggles with empathetic ears, and forgive your own mistakes when you realize all parents are learning by trial and error. Where to find: There are many places you can meet other new parents throughout our community.  [facility] offers the following classes for new moms and their little ones:  Baby and Me (Birth to Crawling) and Breastfeeding Support Group. Go to  www.conehealthybaby.com or call [phone] for more information. Time for your Relationship It's easy to get so caught up in meeting baby's immediate needs that it's hard to find time to connect with your partner, and meet the needs of your relationship. It's also easy to forget what "quality time with your partner" actually looks like. If you take your baby on a date, you'd be amazed how much of your couple time is spent feeding the baby, diapering the baby, admiring the baby, and talking about the baby. Dating: Try to take time for just the two of you. Babysitter tip: Sometimes when moms are breastfeeding a newborn, they find it hard to figure out how to schedule outings around baby's unpredictable feeding schedules. Have the babysitter come for a three hour period. When she comes over, if baby has just eaten, you can leave right away, and come back in two hours. If baby hasn't fed recently, you start the date at home. Once baby gets hungry and gets a good feeding in, you can head out for the rest of your date time. Date Nights at Home: If you can't get out, at least set aside one evening a week to prioritize your relationship: whenever baby dozes off or doesn't have any immediate needs, spend a little time focusing on each other. Potential conflicts: The main relationship conflicts that come up for new parents are: issues related to sexuality, financial stresses, a feeling of an unfair division of household tasks, and conflicts in parenting styles. The more you can work on these issues before baby arrives, the better!  Fun and Frills (Don't forget these. and don't feel guilty for indulging in them!) Everyone has something in life that is a fun little   treat that they do just for themselves. It may be: reading the morning paper, or going for a daily jog, or having coffee with a friend once a week, or going to a movie on Friday nights, or fine chocolates, or bubble baths, or curling up with a good  book. Unless you do fun things for yourself every now and then, it's hard to have the energy for fun with your baby. Whatever your "special" treats are, make sure you find a way to continue to indulge in them after your baby is born. These special moments can recharge you, and allow you to return to baby with a new joy   PERINATAL MOOD DISORDERS: [place]   _________________________________________Emergency and Crisis Resources If you are an imminent risk to self or others, are experiencing intense personal distress, and/or have noticed significant changes in activities of daily living, call:  [place]: [phone]  [address] [place], [place], [address] Mobile Crisis: [place]: 988 Or visit the following crisis centers: Local Emergency Departments [facility]: [address] [place]. Hours: 8:30AM-5PM. Insurance Accepted: Medicaid, Medicare, and Uninsured.  [facility]:  [address], [place]  Mon-Friday 8am-3pm, [phone]                                                                                  ___________ Non-Crisis Resources To identify specific providers that are covered by your insurance, contact your insurance company or local agencies:  [place]: [phone] [facility] and [employer]: [place]: [phone] Postpartum Support International- Warm-line: (518)771-2561                                                      __Outpatient Therapy and Medication Management   Providers:  [facility]: [phone] Hours: 9AM-5PM  Insurance Accepted: Alben Spittle, Shane Crutch, [place], [place] [facility]): [phone] Hours: 8AM-5:30PM  nsurance Accepted: All insurances EXCEPT AARP, [place],  [place], and [place]: [phone] Hours: 8AM-8PM Insurance Accepted: Cristal Ford, Freddrick March, [place], Medicare, [phone] [facility]: [phone] Hours: 8:30AM-7PM Insurance Accepted: Cristal Ford, Medicaid, Medicare, Tricare, [employer] Counseling:  [place] Accepted:  Holland Falling, Lorella Nimrod, [employer], [place]: [phone] Hours: 9AM-5:30PM Insurance Accepted: Alben Spittle, Charlotte Crumb, and Medicaid, Medicare, [facility]:  [phone] Hours: 9am-5pm Insurance Accepted: BCBS; they do not accept Medicaid/Medicare [place]: [phone] Hours: 9am-9pm Insurance Accepted: All major insurance including Medicaid and Medicare [facility]: [phone] Hours: [place] Accepted: All insurances EXCEPT Medicaid and Medicare. [facility]: [phone]   ____________  Parenting Support Groups [facility]: [phone] [facility]:  [phone] Family Support Network: (support for children in the NICU and/or with special needs), [phone]   ___________                                                                 Mental Health Support Groups Mental Health Association: [phone]    _____________                                                                                  Online Resources Postpartum Support International: http://www.postpartum.net/  800-944-4PPD 2Moms Supporting Moms:  www.momssupportingmoms.net    

## 2021-08-14 ENCOUNTER — Encounter: Payer: Self-pay | Admitting: Medical

## 2021-08-14 ENCOUNTER — Encounter: Payer: Medicaid Other | Admitting: Medical

## 2021-08-14 DIAGNOSIS — O365990 Maternal care for other known or suspected poor fetal growth, unspecified trimester, not applicable or unspecified: Secondary | ICD-10-CM | POA: Insufficient documentation

## 2021-08-14 NOTE — BH Specialist Note (Signed)
Integrated Behavioral Health via Telemedicine Visit  08/28/2021 Sada Mazzoni [id number]  Number of Integrated Behavioral Health Clinician visits: 2- Second Visit  Session Start time: 1518   Session End time: 1526  Total time in minutes: 8   Referring Provider: Scheryl Darter, MD Patient/Family location: Home [facility] Provider location: Center for [facility]  All persons participating in visit: Patient Myya Meenach and [facility]   Types of Service: Individual psychotherapy and Video visit  I connected with [name]  n/a  via  Telephone or Video Enabled Telemedicine Application  (Video is Caregility application) and verified that I am speaking with the correct person using two identifiers. Discussed confidentiality: Yes   I discussed the limitations of telemedicine and the availability of in person appointments.  Discussed there is a possibility of technology failure and discussed alternative modes of communication if that failure occurs.  I discussed that engaging in this telemedicine visit, they consent to the provision of behavioral healthcare and the services will be billed under their insurance.  Patient and/or legal guardian expressed understanding and consented to Telemedicine visit: Yes   Presenting Concerns: Patient and/or family reports the following symptoms/concerns: Father of 68yo and 32yo sons passed away unexpectedly on 16-Sep-2021; pt's primary concern is finding someone for boys to talk to about their loss. Duration of problem: 5 days since loss   Patient and/or Family's Strengths/Protective Factors: Concrete supports in place (healthy food, safe environments, etc.), Sense of purpose, and Physical Health (exercise, healthy diet, medication compliance, etc.)  Goals Addressed: Patient will:   Demonstrate ability to: Increase adequate support systems for patient/family and Begin healthy grieving over  loss  Progress towards Goals: Ongoing  Interventions: Interventions utilized:  Supportive Counseling and Psychoeducation and/or Health Education Standardized Assessments completed: Not Needed  Patient and/or Family Response: Patient agrees with treatment plan.   Assessment: Patient currently experiencing Grief.   Patient may benefit from continued therapeutic interventions .  Plan: Follow up with behavioral health clinician on : About two weeks postpartum Behavioral recommendations:  -Continue taking prenatal vitamins as prescribed  -Continue prioritizing healthy self-care (adequate sleep and routine meals) daily -Consider counseling resources for children, on After Visit Summary  Referral(s): Integrated [employer] (In Clinic) and [employer]:  grief counseling for family  I discussed the assessment and treatment plan with the patient and/or parent/guardian. They were provided an opportunity to ask questions and all were answered. They agreed with the plan and demonstrated an understanding of the instructions.   They were advised to call back or seek an in-person evaluation if the symptoms worsen or if the condition fails to improve as anticipated.  Valetta Close Juaquina Machnik, LCSW

## 2021-08-18 ENCOUNTER — Ambulatory Visit: Payer: Medicaid Other | Admitting: *Deleted

## 2021-08-18 ENCOUNTER — Ambulatory Visit (INDEPENDENT_AMBULATORY_CARE_PROVIDER_SITE_OTHER): Payer: Medicaid Other | Admitting: Obstetrics and Gynecology

## 2021-08-18 ENCOUNTER — Ambulatory Visit: Payer: Medicaid Other | Attending: Obstetrics

## 2021-08-18 VITALS — BP 115/71 | HR 94 | Wt 199.3 lb

## 2021-08-18 VITALS — BP 124/71 | HR 84

## 2021-08-18 DIAGNOSIS — Z3A34 34 weeks gestation of pregnancy: Secondary | ICD-10-CM

## 2021-08-18 DIAGNOSIS — O365930 Maternal care for other known or suspected poor fetal growth, third trimester, not applicable or unspecified: Secondary | ICD-10-CM

## 2021-08-18 DIAGNOSIS — O09213 Supervision of pregnancy with history of pre-term labor, third trimester: Secondary | ICD-10-CM | POA: Diagnosis not present

## 2021-08-18 DIAGNOSIS — Z8759 Personal history of other complications of pregnancy, childbirth and the puerperium: Secondary | ICD-10-CM

## 2021-08-18 DIAGNOSIS — O0990 Supervision of high risk pregnancy, unspecified, unspecified trimester: Secondary | ICD-10-CM | POA: Insufficient documentation

## 2021-08-18 DIAGNOSIS — Z6835 Body mass index (BMI) 35.0-35.9, adult: Secondary | ICD-10-CM

## 2021-08-18 DIAGNOSIS — Z148 Genetic carrier of other disease: Secondary | ICD-10-CM

## 2021-08-18 DIAGNOSIS — D563 Thalassemia minor: Secondary | ICD-10-CM | POA: Insufficient documentation

## 2021-08-18 DIAGNOSIS — O365990 Maternal care for other known or suspected poor fetal growth, unspecified trimester, not applicable or unspecified: Secondary | ICD-10-CM

## 2021-08-18 DIAGNOSIS — O10919 Unspecified pre-existing hypertension complicating pregnancy, unspecified trimester: Secondary | ICD-10-CM | POA: Diagnosis present

## 2021-08-18 DIAGNOSIS — R87810 Cervical high risk human papillomavirus (HPV) DNA test positive: Secondary | ICD-10-CM

## 2021-08-18 DIAGNOSIS — Z641 Problems related to multiparity: Secondary | ICD-10-CM

## 2021-08-18 DIAGNOSIS — O403XX0 Polyhydramnios, third trimester, not applicable or unspecified: Secondary | ICD-10-CM

## 2021-08-18 DIAGNOSIS — O10013 Pre-existing essential hypertension complicating pregnancy, third trimester: Secondary | ICD-10-CM | POA: Diagnosis not present

## 2021-08-18 DIAGNOSIS — O99210 Obesity complicating pregnancy, unspecified trimester: Secondary | ICD-10-CM

## 2021-08-18 DIAGNOSIS — O365910 Maternal care for other known or suspected poor fetal growth, first trimester, not applicable or unspecified: Secondary | ICD-10-CM

## 2021-08-18 NOTE — Progress Notes (Unsigned)
  6/12: cephalic, afi 21, bpp 8/8. Elevated dopplers today.  6/6: cephalic, 18%, 2079gm, ac 8%, afi 17, slightly elevated UA at 90% dopplers   June 27th Tuesday @ 2345

## 2021-08-19 ENCOUNTER — Telehealth (HOSPITAL_COMMUNITY): Payer: Self-pay | Admitting: *Deleted

## 2021-08-19 ENCOUNTER — Encounter (HOSPITAL_COMMUNITY): Payer: Self-pay | Admitting: *Deleted

## 2021-08-19 NOTE — Telephone Encounter (Signed)
Preadmission screen  

## 2021-08-22 ENCOUNTER — Encounter (HOSPITAL_COMMUNITY): Payer: Self-pay | Admitting: Obstetrics & Gynecology

## 2021-08-22 ENCOUNTER — Inpatient Hospital Stay (HOSPITAL_COMMUNITY)
Admission: AD | Admit: 2021-08-22 | Discharge: 2021-08-22 | Disposition: A | Discharge status: Home / Self Care | Payer: Medicaid Other | Attending: Obstetrics & Gynecology | Admitting: Obstetrics & Gynecology

## 2021-08-22 DIAGNOSIS — O365930 Maternal care for other known or suspected poor fetal growth, third trimester, not applicable or unspecified: Secondary | ICD-10-CM | POA: Diagnosis present

## 2021-08-22 DIAGNOSIS — Z3A35 35 weeks gestation of pregnancy: Secondary | ICD-10-CM | POA: Diagnosis not present

## 2021-08-22 DIAGNOSIS — Z711 Person with feared health complaint in whom no diagnosis is made: Secondary | ICD-10-CM

## 2021-08-22 DIAGNOSIS — R42 Dizziness and giddiness: Secondary | ICD-10-CM | POA: Insufficient documentation

## 2021-08-22 DIAGNOSIS — O99891 Other specified diseases and conditions complicating pregnancy: Secondary | ICD-10-CM | POA: Diagnosis not present

## 2021-08-22 LAB — URINALYSIS, ROUTINE W REFLEX MICROSCOPIC
Bilirubin Urine: NEGATIVE
Glucose, UA: NEGATIVE mg/dL
Hgb urine dipstick: NEGATIVE
Ketones, ur: NEGATIVE mg/dL
Nitrite: NEGATIVE
Protein, ur: NEGATIVE mg/dL
Specific Gravity, Urine: 1.009 (ref 1.005–1.030)
pH: 7 (ref 5.0–8.0)

## 2021-08-22 NOTE — MAU Provider Note (Signed)
Feeling hot at home. Bed resting at home. 90/40 at home. 101.7 at home. 96.0. Feeling like shes going to pass out. Since Monday. Light headed. Denies headache and blurred vision. Not been outside. Not eating enough food.  History     CSN: [id number]  Arrival date and time: 08/22/21 1311   Event Date/Time   First Provider Initiated Contact with Patient 08/22/21 1441      Chief Complaint  Patient presents with   check up    Unsure if there is something wrong, wanting to be checked   Ana [name], a  31 y.o. [id number] at 4w2dpresents to MAU after feeling hot and light headed at home. Patient states that she Was feeling dizzy and took her BP and it was "90's/40's." States she took her temp at home and was 101.7.  She retook with another device and was 96.0. She states she has no appetite and has only eaten a hot dog weenie and drank water all day. States she's been on bed rest since Monday and is worried about the baby. Feels as though she can not do anything, otherwise she may harm the baby. She endorses positive fetal movement.  She denies headache, blurred vision, nausea and vomiting.          OB History     Gravida  11   Para  6   Term  6   Preterm  0   AB  4   Living  6      SAB  2   IAB  2   Ectopic  0   Multiple  0   Live Births  6           Past Medical History:  Diagnosis Date   Abnormal Pap smear    Anemia    Anxiety    Asthma    Chlamydia 03/2012   Eczema    Gonorrhea    Hypertension    Kidney infection    PID (pelvic inflammatory disease)    Vaginal Pap smear, abnormal     Past Surgical History:  Procedure Laterality Date   DILATION AND CURETTAGE OF UTERUS     WISDOM TOOTH EXTRACTION      Family History  Problem Relation Age of Onset   Hypertension Mother    Heart attack Mother    Healthy Father    Hypertension Maternal Grandmother     Social History   Tobacco Use   Smoking status: Never   Smokeless tobacco: Never   Vaping Use   Vaping Use: Never used  Substance Use Topics   Alcohol use: Not Currently    Comment: occasionally   Drug use: Not Currently    Types: Marijuana    Comment: last 3/12    Allergies: No Known Allergies  Medications Prior to Admission  Medication Sig Dispense Refill Last Dose   acetaminophen (TYLENOL) 325 MG tablet Take 650 mg by mouth every 6 (six) hours as needed.   Past Week   Prenatal Vit-Fe Phos-FA-Omega (VITAFOL GUMMIES) 3.33-0.333-34.8 MG CHEW Chew 1 tablet by mouth daily. 30 tablet 11 08/21/2021   aspirin EC 81 MG tablet Take 1 tablet (81 mg total) by mouth daily. Take after 12 weeks for prevention of preeclampsia later in pregnancy (Patient not taking: Reported on 08/12/2021) 300 tablet 2    Blood Pressure Monitoring (BLOOD PRESSURE KIT) DEVI 1 Device by Does not apply route as needed. (Patient not taking: Reported on 08/18/2021) 1 each 0  Review of Systems  Constitutional:  Positive for activity change. Negative for chills, diaphoresis and fever.  Respiratory:  Negative for cough and shortness of breath.   Gastrointestinal:  Negative for abdominal pain, nausea and vomiting.  Genitourinary:  Negative for vaginal bleeding, vaginal discharge and vaginal pain.  Neurological:  Positive for dizziness and light-headedness. Negative for tremors, seizures, syncope, weakness, numbness and headaches.  Psychiatric/Behavioral:  The patient is nervous/anxious.    Physical Exam   Blood pressure 117/61, pulse 78, temperature 98.5 F (36.9 C), temperature source Oral, resp. rate 18, height '5\' 4"'  (1.626 m), weight 89.3 kg, last menstrual period 12/11/2020, SpO2 99 %, unknown if currently breastfeeding.  Physical Exam Vitals and nursing note reviewed.  Constitutional:      General: She is not in acute distress. HENT:     Head: Normocephalic.  Cardiovascular:     Rate and Rhythm: Normal rate.  Pulmonary:     Effort: Pulmonary effort is normal. No respiratory distress.   Skin:    General: Skin is warm and dry.  Neurological:     Mental Status: She is alert and oriented to person, place, and time.  Psychiatric:        Mood and Affect: Mood normal.    FHT: 145bpm moderate variability with accels present. No decels noted.  Contractions x2 (Patient unaware).   MAU Course  Procedures Orders Placed This Encounter  Procedures   Urinalysis, Routine w reflex microscopic Urine, Clean Catch   Discharge patient   -Juice, Graham crackers, and peanut butter, and chocolate given to the patient.   Patient Vitals for the past 24 hrs:  BP Temp Temp src Pulse Resp SpO2 Height Weight  08/22/21 1425 119/63 -- -- 68 -- -- -- --  08/22/21 1331 117/61 98.5 F (36.9 C) Oral 78 18 99 % '5\' 4"'  (1.626 m) 89.3 kg   Results for orders placed or performed during the hospital encounter of 08/22/21 (from the past 24 hour(s))  Urinalysis, Routine w reflex microscopic Urine, Clean Catch     Status: Abnormal   Collection Time: 08/22/21  2:35 PM  Result Value Ref Range   Color, Urine YELLOW YELLOW   APPearance CLEAR CLEAR   Specific Gravity, Urine 1.009 1.005 - 1.030   pH 7.0 5.0 - 8.0   Glucose, UA NEGATIVE NEGATIVE mg/dL   Hgb urine dipstick NEGATIVE NEGATIVE   Bilirubin Urine NEGATIVE NEGATIVE   Ketones, ur NEGATIVE NEGATIVE mg/dL   Protein, ur NEGATIVE NEGATIVE mg/dL   Nitrite NEGATIVE NEGATIVE   Leukocytes,Ua TRACE (A) NEGATIVE   RBC / HPF 0-5 0 - 5 RBC/hpf   WBC, UA 0-5 0 - 5 WBC/hpf   Bacteria, UA FEW (A) NONE SEEN   Squamous Epithelial / LPF 6-10 0 - 5   Mucus PRESENT     MDM Vital signs stable.  Patient worried. After talking with patient she expressed that she feels as though she could cause harm to baby by doing normal activities. Expressed that she is overwhelmed at home with 6 kids and has no one to talk to. Patient tearful while talking. She is otherwise, clinically well.     Assessment and Plan   1. Physically well but worried   2. [redacted] weeks gestation  of pregnancy   3. Poor fetal growth affecting management of mother in third trimester, single or unspecified fetus   - CNM reassured patient of well being of baby and herself clinically while in MAU. Validated patient in her feelings  of worry and being overwhelmed.  -  Discussed how anxiety and panic attacks can present. Patient currently enrolled in integrated behavioral health. Has appointment scheduled with Roselyn Reef.  - Encouraged patient to increase meal intake with small snacks and incorporating Gatorade or Pedialyte into water regimen.  - FHT: Cat I  - Return Labor precautions provided.  - Patient discharged home in stable condition.  Ana [name] 08/22/2021, 2:45 PM

## 2021-08-22 NOTE — Discharge Instructions (Signed)
Keep up your food and fluid intake.  Small, frequent feedings are best.

## 2021-08-22 NOTE — MAU Note (Signed)
Ana [name] is a 32 y.o. at [redacted]w[redacted]d here in MAU reporting: thanks she is putting herself in a panic.  Is on bedrest. No energy, just hungry- keeps eating. Checked her BP was 93/46, x2- felt dizzy.  Is hot, (kids are freezing)101.7, rechecked on a different thermometer 96.0.  Doesn't know what is going on. Baby is moving. No bleeding or leaking. No change in discomfort, no contractions, just has [employer] off and on   Onset of complaint: this morning. Pain score: none Vitals:   08/22/21 1331  BP: 117/61  Pulse: 78  Resp: 18  Temp: 98.5 F (36.9 C)  SpO2: 99%     FHT:138 Lab orders placed from triage:  none

## 2021-08-22 NOTE — MAU Note (Signed)
Became hot while she was waiting on provider in triage.  'felt like she was going to pass out'. Kept repeating I don't know what I want, just don't feel right.  Pt assisted to rm Pt loud, trying to calm down.  Cool wet cloth and fan obtained for pt comfort.

## 2021-08-26 ENCOUNTER — Ambulatory Visit: Payer: Medicaid Other | Admitting: *Deleted

## 2021-08-26 ENCOUNTER — Ambulatory Visit: Payer: Medicaid Other | Attending: Obstetrics

## 2021-08-26 ENCOUNTER — Encounter: Payer: Self-pay | Admitting: *Deleted

## 2021-08-26 VITALS — BP 124/71 | HR 91

## 2021-08-26 DIAGNOSIS — R87810 Cervical high risk human papillomavirus (HPV) DNA test positive: Secondary | ICD-10-CM | POA: Diagnosis present

## 2021-08-26 DIAGNOSIS — O403XX0 Polyhydramnios, third trimester, not applicable or unspecified: Secondary | ICD-10-CM | POA: Diagnosis not present

## 2021-08-26 DIAGNOSIS — O09213 Supervision of pregnancy with history of pre-term labor, third trimester: Secondary | ICD-10-CM

## 2021-08-26 DIAGNOSIS — O0990 Supervision of high risk pregnancy, unspecified, unspecified trimester: Secondary | ICD-10-CM

## 2021-08-26 DIAGNOSIS — Z148 Genetic carrier of other disease: Secondary | ICD-10-CM

## 2021-08-26 DIAGNOSIS — O0943 Supervision of pregnancy with grand multiparity, third trimester: Secondary | ICD-10-CM

## 2021-08-26 DIAGNOSIS — Z3A35 35 weeks gestation of pregnancy: Secondary | ICD-10-CM

## 2021-08-26 DIAGNOSIS — O365990 Maternal care for other known or suspected poor fetal growth, unspecified trimester, not applicable or unspecified: Secondary | ICD-10-CM | POA: Diagnosis present

## 2021-08-26 DIAGNOSIS — D563 Thalassemia minor: Secondary | ICD-10-CM

## 2021-08-26 DIAGNOSIS — O365930 Maternal care for other known or suspected poor fetal growth, third trimester, not applicable or unspecified: Secondary | ICD-10-CM

## 2021-08-26 DIAGNOSIS — O285 Abnormal chromosomal and genetic finding on antenatal screening of mother: Secondary | ICD-10-CM

## 2021-08-26 DIAGNOSIS — O10013 Pre-existing essential hypertension complicating pregnancy, third trimester: Secondary | ICD-10-CM | POA: Diagnosis not present

## 2021-08-27 ENCOUNTER — Other Ambulatory Visit: Payer: Self-pay

## 2021-08-27 ENCOUNTER — Encounter (HOSPITAL_COMMUNITY): Payer: Self-pay | Admitting: Obstetrics and Gynecology

## 2021-08-27 ENCOUNTER — Inpatient Hospital Stay (HOSPITAL_COMMUNITY)
Admission: AD | Admit: 2021-08-27 | Discharge: 2021-08-27 | Disposition: A | Discharge status: Home / Self Care | Payer: Medicaid Other | Attending: Obstetrics & Gynecology | Admitting: Obstetrics & Gynecology

## 2021-08-27 DIAGNOSIS — O4703 False labor before 37 completed weeks of gestation, third trimester: Secondary | ICD-10-CM

## 2021-08-27 DIAGNOSIS — M549 Dorsalgia, unspecified: Secondary | ICD-10-CM | POA: Insufficient documentation

## 2021-08-27 DIAGNOSIS — O365930 Maternal care for other known or suspected poor fetal growth, third trimester, not applicable or unspecified: Secondary | ICD-10-CM | POA: Insufficient documentation

## 2021-08-27 DIAGNOSIS — O368130 Decreased fetal movements, third trimester, not applicable or unspecified: Secondary | ICD-10-CM | POA: Insufficient documentation

## 2021-08-27 DIAGNOSIS — O26893 Other specified pregnancy related conditions, third trimester: Secondary | ICD-10-CM | POA: Insufficient documentation

## 2021-08-27 DIAGNOSIS — O99891 Other specified diseases and conditions complicating pregnancy: Secondary | ICD-10-CM

## 2021-08-27 DIAGNOSIS — Z3A36 36 weeks gestation of pregnancy: Secondary | ICD-10-CM | POA: Insufficient documentation

## 2021-08-27 LAB — POCT FERN TEST: POCT Fern Test: NEGATIVE

## 2021-08-27 MED ORDER — LACTATED RINGERS IV BOLUS
1000.0000 mL | Freq: Once | INTRAVENOUS | Status: AC
  Administered 2021-08-27: 1000 mL via INTRAVENOUS

## 2021-08-27 MED ORDER — BUTORPHANOL TARTRATE 2 MG/ML IJ SOLN
2.0000 mg | Freq: Once | INTRAMUSCULAR | Status: AC
  Administered 2021-08-27: 2 mg via INTRAVENOUS
  Filled 2021-08-27 (×2): qty 1

## 2021-08-27 MED ORDER — CYCLOBENZAPRINE HCL 5 MG PO TABS
10.0000 mg | ORAL_TABLET | Freq: Once | ORAL | Status: AC
  Administered 2021-08-27: 10 mg via ORAL
  Filled 2021-08-27: qty 2

## 2021-08-27 MED ORDER — CYCLOBENZAPRINE HCL 10 MG PO TABS
5.0000 mg | ORAL_TABLET | Freq: Three times a day (TID) | ORAL | 0 refills | Status: DC | PRN
Start: 2021-08-27 — End: 2022-09-04

## 2021-08-27 NOTE — MAU Note (Signed)
Ana [name] is a 31 y.o. at [redacted]w[redacted]d here in MAU reporting: DFM ongoing for a couple of weeks, has felt 2-3 movements over 2-3 hours today. +FM while listening to [facility]. Contractions started at 11, they are about every 15 minutes. Also LOF since yesterday, states she mentioned it at her appointment. Also has a headache.   Onset of complaint: ongoing  Pain score: 8/10  Vitals:   08/27/21 1812  BP: 132/77  Pulse: 89  Resp: 16  Temp: 98.4 F (36.9 C)  SpO2: 99%     FHT: EFM applied in room  Lab orders placed from triage: none

## 2021-08-27 NOTE — MAU Provider Note (Signed)
Chief Complaint:  Contractions, Rupture of Membranes, and Decreased Fetal Movement   Event Date/Time   First Provider Initiated Contact with Patient 08/27/21 1917      HPI: Ana [name] is a 31 y.o. [id number] at 30w0dwith IUGR, CHTN with IOL planned for 09/03/21 who presents to maternity admissions reporting leaking fluid, contractions, and decreased fetal movement. She reports cramping and back pain since this morning, not resolved with rest in bed or drinking water.  She reports wetness in her underwear but no gush of fluid.  She reported less fetal movement all day today but is feeling more movement in MAU.     HPI  Past Medical History: Past Medical History:  Diagnosis Date   Abnormal Pap smear    Anemia    Anxiety    Asthma    Chlamydia 03/2012   Eczema    Gonorrhea    Hypertension    Kidney infection    PID (pelvic inflammatory disease)    Vaginal Pap smear, abnormal     Past obstetric history: OB History  Gravida Para Term Preterm AB Living  _0 0 4 6  SAB IAB Ectopic Multiple Live Births  2 2 0 0 6    # Outcome Date GA Lbr Len/2nd Weight Sex Delivery Anes PTL Lv  11 Current           10 IAB 09/07/20          9 Term 10/14/16 322w5d9:30 / 00:17 3010 g M Vag-Spont EPI  LIV  8 SAB 2017          7 Term 10/18/14 3825w0d:57 / 00:35 2535 g M Vag-Spont EPI  LIV     Birth Comments: WNL  6 SAB 08/29/12 19w48w6d:20 / 00:03 170 g M Vag-Spont None  FD  5 Term 05/13/10   2722 g F Vag-Spont   LIV  4 Term 06/11/08   2722 g F Vag-Spont   LIV  3 Term 08/21/06   3118 g M Vag-Spont   LIV  2 Term 03/30/05   3175 g F Vag-Spont   LIV  1 IAB             Past Surgical History: Past Surgical History:  Procedure Laterality Date   DILATION AND CURETTAGE OF UTERUS     WISDOM TOOTH EXTRACTION      Family History: Family History  Problem Relation Age of Onset   Hypertension Mother    Heart attack Mother    Healthy Father    Hypertension Maternal Grandmother     Social  History: Social History   Tobacco Use   Smoking status: Never   Smokeless tobacco: Never  Vaping Use   Vaping Use: Never used  Substance Use Topics   Alcohol use: Not Currently    Comment: occasionally   Drug use: Not Currently    Types: Marijuana    Comment: last 3/12    Allergies: No Known Allergies  Meds:  Medications Prior to Admission  Medication Sig Dispense Refill Last Dose   acetaminophen (TYLENOL) 325 MG tablet Take 650 mg by mouth every 6 (six) hours as needed.      aspirin EC 81 MG tablet Take 1 tablet (81 mg total) by mouth daily. Take after 12 weeks for prevention of preeclampsia later in pregnancy (Patient not taking: Reported on 08/12/2021) 300 tablet 2    Blood Pressure Monitoring (BLOOD PRESSURE KIT) DEVI 1 Device by Does not apply route as  needed. (Patient not taking: Reported on 08/18/2021) 1 each 0    Prenatal Vit-Fe Phos-FA-Omega (VITAFOL GUMMIES) 3.33-0.333-34.8 MG CHEW Chew 1 tablet by mouth daily. 30 tablet 11     ROS:  Review of Systems   I have reviewed patient's Past Medical Hx, Surgical Hx, Family Hx, Social Hx, medications and allergies.   Physical Exam  Patient Vitals for the past 24 hrs:  BP Temp Temp src Pulse Resp SpO2  08/27/21 1812 132/77 98.4 F (36.9 C) Oral 89 16 99 %   Constitutional: Well-developed, well-nourished female in no acute distress.  Cardiovascular: normal rate Respiratory: normal effort GI: Abd soft, non-tender, gravid appropriate for gestational age.  MS: Extremities nontender, no edema, normal ROM Neurologic: Alert and oriented x 4.  GU: Neg CVAT.  PELVIC EXAM: Cervix pink, visually closed, without lesion, scant white creamy discharge, no pooling with valsalva, vaginal walls and external genitalia normal   Dilation: Fingertip Effacement (%): 50 Exam by:: Leftwich-Kirby,CNM  FHT:  Baseline 135 , moderate variability, accelerations present, no decelerations Contractions: rare, mild to palpation   Labs: Results  for orders placed or performed during the hospital encounter of 08/27/21 (from the past 24 hour(s))  POCT fern test     Status: Normal   Collection Time: 08/27/21  7:20 PM  Result Value Ref Range   POCT Fern Test Negative = intact amniotic membranes    O/Positive/-- (12/23 1101)  Imaging:   MAU Course/MDM: Orders Placed This Encounter  Procedures   Contraction - monitoring   External fetal heart monitoring   Vaginal exam   POCT fern test   Discharge patient    Meds ordered this encounter  Medications   butorphanol (STADOL) injection 2 mg   lactated ringers bolus 1,000 mL   cyclobenzaprine (FLEXERIL) tablet 10 mg   cyclobenzaprine (FLEXERIL) 10 MG tablet    Sig: Take 0.5-1 tablets (5-10 mg total) by mouth 3 (three) times daily as needed for muscle spasms.    Dispense:  20 tablet    Refill:  0    Order Specific Question:   Supervising Provider    Answer:   [place] Mode [[id number]]     NST reviewed and reactive No evidence of ROM or of preterm labor Pt symptoms improved with IV fluids/Stadol Offered Rx for Flexeril and pt concerned about costs so dose given in MAU and Rx for pt to pick up later F/U in office as scheduled Reviewed reasons to return to MAU   Assessment: 1. Threatened premature labor in third trimester   2. Back pain affecting pregnancy in third trimester   3. [redacted] weeks gestation of pregnancy     Plan: Discharge home Labor precautions and fetal kick counts  Follow-up [place] for [place] [facility] Follow up.   Specialty: Obstetrics and Gynecology Why: As scheduled Contact information: [address] [place] [id number] [facility] up.   Specialty: Obstetrics and Gynecology Why: As needed for emergencies Contact information: [address] [id number] [place] Certified Nurse-Midwife 08/27/2021 10:21 PM

## 2021-08-27 NOTE — Discharge Instructions (Signed)
Reasons to return to MAU at [facility]:  1.  Contractions are  5 minutes apart or less, each last 1 minute, these have been going on for 1-2 hours, and you cannot walk or talk during them 2.  You have a large gush of fluid, or a trickle of fluid that will not stop and you have to wear a pad 3.  You have bleeding that is bright red, heavier than spotting--like menstrual bleeding (spotting can be normal in early labor or after a check of your cervix) 4.  You do not feel the baby moving like he/she normally does  

## 2021-08-28 ENCOUNTER — Ambulatory Visit: Payer: Medicaid Other | Admitting: Clinical

## 2021-08-28 ENCOUNTER — Other Ambulatory Visit (HOSPITAL_COMMUNITY)
Admission: RE | Admit: 2021-08-28 | Discharge: 2021-08-28 | Disposition: A | Discharge status: Home / Self Care | Payer: Medicaid Other | Source: Ambulatory Visit | Attending: Family Medicine | Admitting: Family Medicine

## 2021-08-28 ENCOUNTER — Ambulatory Visit (INDEPENDENT_AMBULATORY_CARE_PROVIDER_SITE_OTHER): Payer: Medicaid Other | Admitting: Family Medicine

## 2021-08-28 VITALS — BP 112/67 | HR 80 | Wt 190.2 lb

## 2021-08-28 DIAGNOSIS — O0990 Supervision of high risk pregnancy, unspecified, unspecified trimester: Secondary | ICD-10-CM

## 2021-08-28 DIAGNOSIS — O365930 Maternal care for other known or suspected poor fetal growth, third trimester, not applicable or unspecified: Secondary | ICD-10-CM

## 2021-08-28 DIAGNOSIS — Z3A36 36 weeks gestation of pregnancy: Secondary | ICD-10-CM

## 2021-08-28 DIAGNOSIS — O10919 Unspecified pre-existing hypertension complicating pregnancy, unspecified trimester: Secondary | ICD-10-CM

## 2021-08-28 DIAGNOSIS — F4321 Adjustment disorder with depressed mood: Secondary | ICD-10-CM

## 2021-08-28 DIAGNOSIS — Z641 Problems related to multiparity: Secondary | ICD-10-CM

## 2021-08-28 DIAGNOSIS — O409XX0 Polyhydramnios, unspecified trimester, not applicable or unspecified: Secondary | ICD-10-CM

## 2021-08-28 NOTE — Patient Instructions (Addendum)
Center for [facility] at [facility] [address] [place], [place] [address] [phone] (main office) [phone] (Login [place]'s office)  [facility] (Individual and group grief support) [facility]Gerre Scull  [phone]   Pediatric therapy options:   [facility]                                                                                    [email or web address] [address] [place]ie [place]nam [place], [place] [address]                                   Ph: [phone]                                          Fax: [phone]                       [email or web address]  [facility] [email or web address] [address] [place], [place] [address] Ph. [phone]                          Fax. [phone]  [facility] [email or web address] [address] [place], [place] [address]   Fax [phone]  Phone: [phone]  [facility] [address]  [place], [place] [address]  Ph: [phone] Fax: [phone] [email or web address]  [facility]                           [email or web address] [address] [place], [place], [place] [address]         Ph: [phone]                                         Fax: [phone]                                      Email:  [email or web address]  [facility]                    [email or web address] [address], [place], [place] [address]                                   Ph: [phone] / [phone] Fax: [phone] // For [place] directly, phone is [phone] and fax is [phone]  [facility] [address]                              [place], [address]             [phone]  Pheobe Ivey: Intake                 [phone]   [email or web address]  [facility])                [facility]                            [address] [place], [place]  Ph: [phone]                                         [facility]                    [email or web address] [place]: [address] [place], [place]                                 Ph: [phone]; Fax: [phone]      [place]: [address], [place], [place]                                                        Ph: [phone]; Fax: [phone]  [facility]                                               [email or web address] [place]: [address], [place], [place]                                 [place]: [address], [place], [place]                                                              Ph: [phone];  Fax: [phone]    Email: [email or web address]  [facility]                                [email or web address] [address] [place], [place]                                                                Ph: [phone]; Fax: [phone]        [facility]/ [phone], LPC, LCAS  [email or web address]/[email or web address]_[email or web address] [address] [place], [place] [address]        Ph: [phone]/ [phone]             Fax: [phone]  [facility]                           [email or web address] Address: [address] Mervyn Skeeters [place], [place] [address]                            Ph: [phone];  Fax: [phone]  [facility], [facility] Fax [phone] Address: [address], [place], [place]  [address] Phone: [phone]  [facility] [address] [place], [place] [address]  [phone] P  [phone]  [facility]          [email or web address] [address] [place], [place] [address]                 Ph: [phone]                                          Fax: [phone]                                      [facility]                 [email or web address] [address] [place], [place] [address]  [phone] Fax: [phone]  [facility]                                   [email or web address] [address] [place], 2nd floor, [place], [place] [address]                                Ph: [phone]; fax: [phone]      Hours: M-Th 830a-8p; Fri 930a-7p  [facility]                                        [email or web address] [address] [place] [address], [place] [address]                       Ph: [phone]                       Fax: [phone]                             [facility]                                                [email or web address] [address], [place], [place] [address]      Main phone: [phone]  [facility] [address] Elaina Hoops [place], [place] [address] Fax: [phone] Phone: [phone]

## 2021-08-28 NOTE — Progress Notes (Signed)
   PRENATAL VISIT NOTE  Subjective:  Ana [name] is a 31 y.o. [id number] at [redacted]w[redacted]d being seen today for ongoing prenatal care.  She is currently monitored for the following issues for this high-risk pregnancy and has Asthma, mild intermittent; History of placenta abruption; Supervision of high risk pregnancy, antepartum; Chronic hypertension affecting pregnancy; Pap smear of cervix shows high risk HPV present; Alpha thalassemia silent carrier; Carrier of spinal muscular atrophy; Grand multiparity; Polyhydramnios affecting pregnancy; BMI 35.0-35.9,adult; Obesity in pregnancy; and IUGR (intrauterine growth restriction) affecting care of mother on their problem list.  Patient reports nausea and poor appetite .  Contractions: Irregular. Vag. Bleeding: None.  Movement: Present. Denies leaking of fluid.   The following portions of the patient's history were reviewed and updated as appropriate: allergies, current medications, past family history, past medical history, past social history, past surgical history and problem list.   Objective:   Vitals:   08/28/21 1024  BP: 112/67  Pulse: 80  Weight: 190 lb 3.2 oz (86.3 kg)    Fetal Status: Fetal Heart Rate (bpm): 148   Movement: Present     General:  Alert, oriented and cooperative. Patient is in no acute distress.  Skin: Skin is warm and dry. No rash noted.   Cardiovascular: Normal heart rate noted  Respiratory: Normal respiratory effort, no problems with respiration noted  Abdomen: Soft, gravid, appropriate for gestational age.  Pain/Pressure: Present     Pelvic: Cervical exam deferred        Extremities: Normal range of motion.  Edema: None  Mental Status: Normal mood and affect. Normal behavior. Normal judgment and thought content.   Assessment and Plan:  Pregnancy: [id number] at [redacted]w[redacted]d 1. Chronic hypertension affecting pregnancy BP 112/67 today in office, continue to monitor. Currently no medication for HTN.  2. Supervision of high risk  pregnancy, antepartum Report to MAU if pain, watery discharge, bleeding, reduced fetal movement. - Cervicovaginal ancillary only( [place]) - Culture, beta strep (group b only)  3. Polyhydramnios affecting pregnancy In antenatal testing.  4. Poor fetal growth affecting management of mother in third trimester, single or unspecified fetus Plan delivery at 37 weeks--scheduled  5. Grand multiparity Discussed warning symptoms and signs of labor, discussed plan to report to MAU if concerns before scheduled inductions on 28 June.  Preterm labor symptoms and general obstetric precautions including but not limited to vaginal bleeding, contractions, leaking of fluid and fetal movement were reviewed in detail with the patient. Please refer to After Visit Summary for other counseling recommendations.   Return in 1 week (on 09/04/2021).  Future Appointments  Date Time Provider Department Center  08/28/2021  3:15 PM [facility]  09/02/2021 10:15 AM [facility] NURSE [facility]  09/02/2021 10:30 AM [facility] US2 [facility]  09/03/2021 12:00 AM [facility] SCHED ROOM [facility] None  09/03/2021  2:15 PM Venora Maples, MD [facility]  09/10/2021  8:35 AM Robbins Bing, MD [facility]  09/17/2021 11:15 AM  Bing, MD [facility]  09/24/2021 11:15 AM Milas Hock, MD [facility]    Driscilla Grammes, Student-PA

## 2021-08-29 LAB — CERVICOVAGINAL ANCILLARY ONLY
Chlamydia: NEGATIVE
Comment: NEGATIVE
Comment: NORMAL
Neisseria Gonorrhea: NEGATIVE

## 2021-08-30 LAB — CULTURE, BETA STREP (GROUP B ONLY): Strep Gp B Culture: POSITIVE — AB

## 2021-08-31 ENCOUNTER — Encounter: Payer: Self-pay | Admitting: Family Medicine

## 2021-09-01 ENCOUNTER — Encounter: Payer: Self-pay | Admitting: Obstetrics and Gynecology

## 2021-09-02 ENCOUNTER — Ambulatory Visit: Payer: Medicaid Other | Attending: Obstetrics

## 2021-09-02 ENCOUNTER — Encounter: Payer: Self-pay | Admitting: *Deleted

## 2021-09-02 ENCOUNTER — Ambulatory Visit: Payer: Medicaid Other | Admitting: *Deleted

## 2021-09-02 VITALS — BP 132/70 | HR 90

## 2021-09-02 DIAGNOSIS — O365930 Maternal care for other known or suspected poor fetal growth, third trimester, not applicable or unspecified: Secondary | ICD-10-CM

## 2021-09-02 DIAGNOSIS — O403XX0 Polyhydramnios, third trimester, not applicable or unspecified: Secondary | ICD-10-CM | POA: Diagnosis not present

## 2021-09-02 DIAGNOSIS — R87810 Cervical high risk human papillomavirus (HPV) DNA test positive: Secondary | ICD-10-CM | POA: Diagnosis present

## 2021-09-02 DIAGNOSIS — D563 Thalassemia minor: Secondary | ICD-10-CM

## 2021-09-02 DIAGNOSIS — O0943 Supervision of pregnancy with grand multiparity, third trimester: Secondary | ICD-10-CM

## 2021-09-02 DIAGNOSIS — O0990 Supervision of high risk pregnancy, unspecified, unspecified trimester: Secondary | ICD-10-CM | POA: Insufficient documentation

## 2021-09-02 DIAGNOSIS — O285 Abnormal chromosomal and genetic finding on antenatal screening of mother: Secondary | ICD-10-CM

## 2021-09-02 DIAGNOSIS — O365990 Maternal care for other known or suspected poor fetal growth, unspecified trimester, not applicable or unspecified: Secondary | ICD-10-CM | POA: Diagnosis present

## 2021-09-02 DIAGNOSIS — O09213 Supervision of pregnancy with history of pre-term labor, third trimester: Secondary | ICD-10-CM

## 2021-09-02 DIAGNOSIS — Z148 Genetic carrier of other disease: Secondary | ICD-10-CM | POA: Diagnosis present

## 2021-09-02 DIAGNOSIS — O10013 Pre-existing essential hypertension complicating pregnancy, third trimester: Secondary | ICD-10-CM

## 2021-09-02 DIAGNOSIS — Z3A36 36 weeks gestation of pregnancy: Secondary | ICD-10-CM

## 2021-09-03 ENCOUNTER — Inpatient Hospital Stay (HOSPITAL_COMMUNITY): Payer: Medicaid Other

## 2021-09-03 ENCOUNTER — Encounter: Payer: Medicaid Other | Admitting: Family Medicine

## 2021-09-03 ENCOUNTER — Other Ambulatory Visit: Payer: Self-pay | Admitting: Advanced Practice Midwife

## 2021-09-03 ENCOUNTER — Inpatient Hospital Stay (HOSPITAL_COMMUNITY): Admission: AD | Admit: 2021-09-03 | Payer: Medicaid Other | Source: Home / Self Care | Admitting: Family Medicine

## 2021-09-04 ENCOUNTER — Other Ambulatory Visit: Payer: Self-pay

## 2021-09-05 ENCOUNTER — Inpatient Hospital Stay (HOSPITAL_COMMUNITY): Admission: AD | Admit: 2021-09-05 | Payer: Medicaid Other | Source: Home / Self Care | Admitting: Family Medicine

## 2021-09-05 ENCOUNTER — Encounter (HOSPITAL_COMMUNITY): Payer: Self-pay | Admitting: Obstetrics and Gynecology

## 2021-09-05 ENCOUNTER — Inpatient Hospital Stay (HOSPITAL_COMMUNITY)
Admission: RE | Admit: 2021-09-05 | Discharge: 2021-09-08 | DRG: 807 | Disposition: A | Discharge status: Home / Self Care | Payer: Medicaid Other | Attending: Obstetrics and Gynecology | Admitting: Obstetrics and Gynecology

## 2021-09-05 ENCOUNTER — Inpatient Hospital Stay (HOSPITAL_COMMUNITY): Payer: Medicaid Other | Admitting: Anesthesiology

## 2021-09-05 ENCOUNTER — Inpatient Hospital Stay (HOSPITAL_COMMUNITY): Payer: Medicaid Other | Attending: Family Medicine

## 2021-09-05 DIAGNOSIS — O99824 Streptococcus B carrier state complicating childbirth: Secondary | ICD-10-CM | POA: Diagnosis present

## 2021-09-05 DIAGNOSIS — O1092 Unspecified pre-existing hypertension complicating childbirth: Secondary | ICD-10-CM | POA: Diagnosis not present

## 2021-09-05 DIAGNOSIS — D563 Thalassemia minor: Secondary | ICD-10-CM | POA: Diagnosis present

## 2021-09-05 DIAGNOSIS — R87810 Cervical high risk human papillomavirus (HPV) DNA test positive: Secondary | ICD-10-CM

## 2021-09-05 DIAGNOSIS — O365910 Maternal care for other known or suspected poor fetal growth, first trimester, not applicable or unspecified: Secondary | ICD-10-CM

## 2021-09-05 DIAGNOSIS — O99214 Obesity complicating childbirth: Secondary | ICD-10-CM | POA: Diagnosis present

## 2021-09-05 DIAGNOSIS — O1002 Pre-existing essential hypertension complicating childbirth: Secondary | ICD-10-CM | POA: Diagnosis present

## 2021-09-05 DIAGNOSIS — Z148 Genetic carrier of other disease: Secondary | ICD-10-CM | POA: Diagnosis not present

## 2021-09-05 DIAGNOSIS — O10919 Unspecified pre-existing hypertension complicating pregnancy, unspecified trimester: Secondary | ICD-10-CM

## 2021-09-05 DIAGNOSIS — U071 COVID-19: Secondary | ICD-10-CM

## 2021-09-05 DIAGNOSIS — O365930 Maternal care for other known or suspected poor fetal growth, third trimester, not applicable or unspecified: Secondary | ICD-10-CM | POA: Diagnosis present

## 2021-09-05 DIAGNOSIS — O0990 Supervision of high risk pregnancy, unspecified, unspecified trimester: Secondary | ICD-10-CM

## 2021-09-05 DIAGNOSIS — Z3A37 37 weeks gestation of pregnancy: Secondary | ICD-10-CM

## 2021-09-05 DIAGNOSIS — O99820 Streptococcus B carrier state complicating pregnancy: Secondary | ICD-10-CM

## 2021-09-05 DIAGNOSIS — Z3A34 34 weeks gestation of pregnancy: Secondary | ICD-10-CM

## 2021-09-05 DIAGNOSIS — Z641 Problems related to multiparity: Secondary | ICD-10-CM

## 2021-09-05 LAB — CBC
HCT: 31.4 % — ABNORMAL LOW (ref 36.0–46.0)
HCT: 32.4 % — ABNORMAL LOW (ref 36.0–46.0)
HCT: 33.1 % — ABNORMAL LOW (ref 36.0–46.0)
Hemoglobin: 10.3 g/dL — ABNORMAL LOW (ref 12.0–15.0)
Hemoglobin: 10.8 g/dL — ABNORMAL LOW (ref 12.0–15.0)
Hemoglobin: 11.1 g/dL — ABNORMAL LOW (ref 12.0–15.0)
MCH: 29 pg (ref 26.0–34.0)
MCH: 29.1 pg (ref 26.0–34.0)
MCH: 30.1 pg (ref 26.0–34.0)
MCHC: 32.6 g/dL (ref 30.0–36.0)
MCHC: 32.8 g/dL (ref 30.0–36.0)
MCHC: 34.3 g/dL (ref 30.0–36.0)
MCV: 87.8 fL (ref 80.0–100.0)
MCV: 88.7 fL (ref 80.0–100.0)
MCV: 88.7 fL (ref 80.0–100.0)
Platelets: 191 10*3/uL (ref 150–400)
Platelets: 197 10*3/uL (ref 150–400)
Platelets: 211 K/uL (ref 150–400)
RBC: 3.54 MIL/uL — ABNORMAL LOW (ref 3.87–5.11)
RBC: 3.69 MIL/uL — ABNORMAL LOW (ref 3.87–5.11)
RBC: 3.73 MIL/uL — ABNORMAL LOW (ref 3.87–5.11)
RDW: 12.5 % (ref 11.5–15.5)
RDW: 12.6 % (ref 11.5–15.5)
RDW: 12.6 % (ref 11.5–15.5)
WBC: 11.7 K/uL — ABNORMAL HIGH (ref 4.0–10.5)
WBC: 11.9 10*3/uL — ABNORMAL HIGH (ref 4.0–10.5)
WBC: 9.1 10*3/uL (ref 4.0–10.5)
nRBC: 0 % (ref 0.0–0.2)
nRBC: 0 % (ref 0.0–0.2)
nRBC: 0 % (ref 0.0–0.2)

## 2021-09-05 LAB — TYPE AND SCREEN
ABO/RH(D): O POS
Antibody Screen: NEGATIVE

## 2021-09-05 LAB — RPR: RPR Ser Ql: NONREACTIVE

## 2021-09-05 MED ORDER — TERBUTALINE SULFATE 1 MG/ML IJ SOLN: 0.2500 mg | Freq: Once | INTRAMUSCULAR | Status: DC | PRN

## 2021-09-05 MED ORDER — FENTANYL CITRATE (PF) 100 MCG/2ML IJ SOLN: 50.0000 ug | INTRAMUSCULAR | Status: DC | PRN

## 2021-09-05 MED ORDER — ONDANSETRON HCL 4 MG/2ML IJ SOLN
4.0000 mg | Freq: Four times a day (QID) | INTRAMUSCULAR | Status: DC | PRN
  Administered 2021-09-06: 4 mg via INTRAVENOUS
  Filled 2021-09-05: qty 2

## 2021-09-05 MED ORDER — OXYTOCIN-SODIUM CHLORIDE 30-0.9 UT/500ML-% IV SOLN: 1.0000 m[IU]/min | INTRAVENOUS | Status: DC

## 2021-09-05 MED ORDER — ACETAMINOPHEN 325 MG PO TABS: 650.0000 mg | ORAL_TABLET | ORAL | Status: DC | PRN

## 2021-09-05 MED ORDER — LIDOCAINE HCL (PF) 1 % IJ SOLN
INTRAMUSCULAR | Status: DC | PRN
  Administered 2021-09-05: 6 mL via EPIDURAL
  Administered 2021-09-05: 4 mL via EPIDURAL

## 2021-09-05 MED ORDER — PHENYLEPHRINE 80 MCG/ML (10ML) SYRINGE FOR IV PUSH (FOR BLOOD PRESSURE SUPPORT)
80.0000 ug | PREFILLED_SYRINGE | INTRAVENOUS | Status: DC | PRN
  Administered 2021-09-06: 80 ug via INTRAVENOUS
  Filled 2021-09-05: qty 10

## 2021-09-05 MED ORDER — SOD CITRATE-CITRIC ACID 500-334 MG/5ML PO SOLN: 30.0000 mL | ORAL | Status: DC | PRN

## 2021-09-05 MED ORDER — LACTATED RINGERS IV SOLN
500.0000 mL | INTRAVENOUS | Status: DC | PRN
  Administered 2021-09-06: 500 mL via INTRAVENOUS

## 2021-09-05 MED ORDER — SODIUM CHLORIDE 0.9 % IV SOLN: INTRAVENOUS | Status: DC | PRN

## 2021-09-05 MED ORDER — SODIUM CHLORIDE 0.9 % IV SOLN
5.0000 10*6.[IU] | Freq: Once | INTRAVENOUS | Status: AC
  Administered 2021-09-05: 5 10*6.[IU] via INTRAVENOUS
  Filled 2021-09-05: qty 5

## 2021-09-05 MED ORDER — OXYTOCIN-SODIUM CHLORIDE 30-0.9 UT/500ML-% IV SOLN
1.0000 m[IU]/min | INTRAVENOUS | Status: DC
  Administered 2021-09-05: 2 m[IU]/min via INTRAVENOUS
  Administered 2021-09-05: 10 m[IU]/min via INTRAVENOUS

## 2021-09-05 MED ORDER — SODIUM CHLORIDE 0.9 % IV SOLN: 1200.0000 mg | Freq: Once | INTRAVENOUS | Status: DC

## 2021-09-05 MED ORDER — ZOLPIDEM TARTRATE 5 MG PO TABS
5.0000 mg | ORAL_TABLET | Freq: Every evening | ORAL | Status: DC | PRN
Start: 2021-09-05 — End: 2021-09-08
  Administered 2021-09-05: 5 mg via ORAL
  Filled 2021-09-05: qty 1

## 2021-09-05 MED ORDER — LACTATED RINGERS IV SOLN: INTRAVENOUS | Status: DC

## 2021-09-05 MED ORDER — ALBUTEROL SULFATE HFA 108 (90 BASE) MCG/ACT IN AERS: 2.0000 | INHALATION_SPRAY | Freq: Once | RESPIRATORY_TRACT | Status: DC | PRN

## 2021-09-05 MED ORDER — SODIUM CHLORIDE 0.9 % IV SOLN: 250.0000 mL | INTRAVENOUS | Status: DC | PRN

## 2021-09-05 MED ORDER — DIPHENHYDRAMINE HCL 50 MG/ML IJ SOLN: 12.5000 mg | INTRAMUSCULAR | Status: DC | PRN

## 2021-09-05 MED ORDER — LIDOCAINE HCL (PF) 1 % IJ SOLN: 30.0000 mL | INTRAMUSCULAR | Status: DC | PRN

## 2021-09-05 MED ORDER — SODIUM CHLORIDE 0.9% FLUSH
3.0000 mL | Freq: Two times a day (BID) | INTRAVENOUS | Status: DC
  Administered 2021-09-06: 3 mL via INTRAVENOUS

## 2021-09-05 MED ORDER — METHYLPREDNISOLONE SODIUM SUCC 125 MG IJ SOLR: 125.0000 mg | Freq: Once | INTRAMUSCULAR | Status: DC | PRN

## 2021-09-05 MED ORDER — OXYTOCIN BOLUS FROM INFUSION
333.0000 mL | Freq: Once | INTRAVENOUS | Status: AC
  Administered 2021-09-06: 333 mL via INTRAVENOUS

## 2021-09-05 MED ORDER — EPINEPHRINE 0.3 MG/0.3ML IJ SOAJ: 0.3000 mg | Freq: Once | INTRAMUSCULAR | Status: DC | PRN

## 2021-09-05 MED ORDER — OXYTOCIN-SODIUM CHLORIDE 30-0.9 UT/500ML-% IV SOLN
1.0000 m[IU]/min | INTRAVENOUS | Status: DC
  Administered 2021-09-06: 3 m[IU]/min via INTRAVENOUS
  Filled 2021-09-05: qty 500

## 2021-09-05 MED ORDER — EPHEDRINE 5 MG/ML INJ: 10.0000 mg | INTRAVENOUS | Status: DC | PRN

## 2021-09-05 MED ORDER — OXYTOCIN-SODIUM CHLORIDE 30-0.9 UT/500ML-% IV SOLN: 2.5000 [IU]/h | INTRAVENOUS | Status: DC

## 2021-09-05 MED ORDER — FENTANYL-BUPIVACAINE-NACL 0.5-0.125-0.9 MG/250ML-% EP SOLN
12.0000 mL/h | EPIDURAL | Status: DC | PRN
  Administered 2021-09-05: 12 mL/h via EPIDURAL
  Filled 2021-09-05: qty 250

## 2021-09-05 MED ORDER — DIPHENHYDRAMINE HCL 50 MG/ML IJ SOLN: 50.0000 mg | Freq: Once | INTRAMUSCULAR | Status: DC | PRN

## 2021-09-05 MED ORDER — LACTATED RINGERS IV SOLN: 500.0000 mL | Freq: Once | INTRAVENOUS | Status: DC

## 2021-09-05 MED ORDER — PHENYLEPHRINE 80 MCG/ML (10ML) SYRINGE FOR IV PUSH (FOR BLOOD PRESSURE SUPPORT)
80.0000 ug | PREFILLED_SYRINGE | INTRAVENOUS | Status: AC | PRN
  Administered 2021-09-06 (×3): 80 ug via INTRAVENOUS

## 2021-09-05 MED ORDER — SODIUM CHLORIDE 0.9% FLUSH: 3.0000 mL | INTRAVENOUS | Status: DC | PRN

## 2021-09-05 MED ORDER — MISOPROSTOL 50MCG HALF TABLET
50.0000 ug | ORAL_TABLET | ORAL | Status: DC | PRN
  Administered 2021-09-05: 50 ug via BUCCAL
  Filled 2021-09-05: qty 1

## 2021-09-05 MED ORDER — PENICILLIN G POT IN DEXTROSE 60000 UNIT/ML IV SOLN
3.0000 10*6.[IU] | INTRAVENOUS | Status: DC
  Administered 2021-09-05 – 2021-09-06 (×4): 3 10*6.[IU] via INTRAVENOUS
  Filled 2021-09-05 (×4): qty 50

## 2021-09-05 MED ORDER — FAMOTIDINE IN NACL 20-0.9 MG/50ML-% IV SOLN: 20.0000 mg | Freq: Once | INTRAVENOUS | Status: DC | PRN

## 2021-09-05 NOTE — Progress Notes (Signed)
Patient ID: Ana [name], female   DOB: 10-14-1990, 31 y.o.   MRN: [id number]  Becoming more uncomfortable and is requesting to be prepared for an epidural (repeat labs/IV fluids)  BPs 130/74, 112/87, 127/64 FHR 130s, +accels, no decels Ctx irreg 4-10 mins with Pit at 51mu/min Cx deferred  IUP@37 .2wks SGA cHTN- BPs stable without meds Cx favorable  Hopeful that with AROM/Pit she will get into active labor soon Anticipate vag del  Arabella Merles CNM 09/05/2021

## 2021-09-05 NOTE — Progress Notes (Signed)
Patient Vitals for the past 4 hrs:  BP Pulse Resp  09/05/21 0700 (!) 107/54 70 16  09/05/21 0636 123/60 72 16  09/05/21 0500 (!) 120/59 73 16  09/05/21 0400 105/68 74 16   FHR Cat 1.  Mild and irregular ctx.  1st cytotec given at 0500, cx 1.5/50/-3.  Plan pitocin at 0900 (Pt declined balloon).

## 2021-09-05 NOTE — Progress Notes (Signed)
Patient ID: Ana [name], female   DOB: Sep 29, 1990, 31 y.o.   MRN: [id number]  Labor Progress Note Ana [name] is a 31 y.o. [id number] at [redacted]w[redacted]d presented for IOL for cHTN/FGR  S:  To bedside to assess for AROM per patient request. Pt kicked support people out so she could cry and relax - FOB has not been as supportive as she'd like and she's feeling frustrated by lack of progress.  O:  BP 127/64   Pulse 75   Temp 97.6 F (36.4 C) (Oral)   Resp 18   Ht 5\' 4"  (1.626 m)   Wt 196 lb 1.6 oz (89 kg)   LMP 12/11/2020 (Approximate)   BMI 33.66 kg/m  EFM: baseline 130 bpm/ moderate variability/ 15x15 accels/ no decels  Toco/IUPC: UI SVE: 3/80/-3 Pitocin: 28mu/min  A/P: 31 y.o. 38 [redacted]w[redacted]d  1. Labor: Latent, baby still high with very posterior cervix, not ready for AROM by CNM. Dr. [redacted]w[redacted]d amenable to AROM once pt has had her break. 2. FWB: Cat 1 3. Pain: Planning epidural, can have when ready 4. cHTN (no meds): BP stable, no s/sx PEC  Pt discouraged by lack of progress but also hungry and in need of a mental health break. Suggested we turn off Pitocin, let her eat and shower, then AROM and restart with either another dose of cytotec or Pitocin. Pt appreciative and very amenable to plan. Anticipate SVD.  Para March, CNM, MSN, IBCLC Certified Nurse Midwife, [facility]

## 2021-09-05 NOTE — Progress Notes (Signed)
Patient ID: [name], female   DOB: April 29, 1990, 31 y.o.   MRN: [id number]  Labor Progress Note Leafy Motsinger is a 31 y.o. [id number] at [redacted]w[redacted]d presented for IOL for cHTN/FGR  S:  To bedside to assess for AROM per patient request.   O:  BP 112/87   Pulse 87   Temp 97.6 F (36.4 C) (Oral)   Resp 18   Ht 5\' 4"  (1.626 m)   Wt 89 kg   LMP 12/11/2020 (Approximate)   BMI 33.66 kg/m  EFM: baseline 130 bpm/ moderate variability/ 15x15 accels/ no decels  Toco/IUPC: UI SVE: Dilation: 3 Effacement (%): 50 Station: -2 Presentation: Vertex Exam by:: Dr. [email or web address] AROM to clear; cervix is soft Pitocin: off  A/P: 31 y.o. 38 [redacted]w[redacted]d  1. Labor: Latent into active, AROM done. Will resume pitocin.  2. FWB: Cat 1 3. Pain: Planning epidural, can have when ready 4. cHTN (no meds): BP stable, no s/sx PEC  Anticipate SVD.  [redacted]w[redacted]d, MD Attending Obstetrician & Gynecologist, [facility] for [facility], [facility]

## 2021-09-05 NOTE — Progress Notes (Signed)
Patient ID: Ana [name], female   DOB: 18-Dec-1990, 31 y.o.   MRN: [id number]  Labor Progress Note Ana [name] is a 31 y.o. [id number] at [redacted]w[redacted]d presented for IOL for cHTN/FGR  S:  Pt up and ready to proceed with IOL, finally able to rest after Ambien dose last night (up until about 7am, able to rest until 0930). FOB at bedside for support  O:  BP (!) 83/73   Pulse 71   Temp (!) 97.1 F (36.2 C) (Axillary)   Resp 16   Ht 5\' 4"  (1.626 m)   Wt 196 lb 1.6 oz (89 kg)   LMP 12/11/2020 (Approximate)   BMI 33.66 kg/m  EFM: baseline 140 bpm/ moderate variability/ 15x15 accels/ no decels  Toco/IUPC: UI SVE: Dilation: 1.5 Effacement (%): 50 Station: -3 Presentation: Vertex Exam by:: [email or web address], CNM Pitocin: to start 2x2  A/P: 31 y.o. 38 103w2d  1. Labor: Latent. Shared decision making with patient to begin pitocin titration with ambulation. Declines FB and wants to get labor going as quickly as possible 2. FWB: Cat 1 3. Pain: Planning epidural, can have when ready 4. cHTN (no meds): BP stable, no s/sx PEC  Begin pitocin titration, pt to ambulate in halls then use ball in room. Anticipate SVD.  [redacted]w[redacted]d, CNM, MSN, IBCLC Certified Nurse Midwife, [facility]

## 2021-09-05 NOTE — Progress Notes (Signed)
Patient ID: Ana [name], female   DOB: 12-Nov-1990, 31 y.o.   MRN: [id number]  Labor Progress Note Ana [name] is a 31 y.o. [id number] at [redacted]w[redacted]d presented for IOL for cHTN/FGR  S:  To bedside to assess for AROM per patient request. FOB and MGM at bedside for support.  O:  BP 114/72   Pulse 72   Temp (!) 97.1 F (36.2 C) (Axillary)   Resp 18   Ht 5\' 4"  (1.626 m)   Wt 196 lb 1.6 oz (89 kg)   LMP 12/11/2020 (Approximate)   BMI 33.66 kg/m  EFM: baseline 130 bpm/ moderate variability/ 15x15 accels/ no decels  Toco/IUPC: UI SVE: Dilation: 3 Effacement (%): 50 Station: -3 Presentation: Vertex Exam by:: Analisa Sledd, CNM Pitocin: 29mu/min  A/P: 31 y.o. 38 [redacted]w[redacted]d  1. Labor: Latent into active, not yet ready for AROM due to pt station and thickness of cervix. Encouraged ambulation will reassess when pt more uncomfortable. 2. FWB: Cat 1 3. Pain: Planning epidural, can have when ready 4. cHTN (no meds): BP stable, no s/sx PEC  Pt reported pain/burning at IV site with administration of 4th dose of PCN. RN has tried hot packs, ice, slowing and diluting the infusion. Pt has been adequately treated for GBS status, declined further doses of PCN. Anticipate SVD.  [redacted]w[redacted]d, CNM, MSN, IBCLC Certified Nurse Midwife, [facility]

## 2021-09-05 NOTE — Anesthesia Procedure Notes (Signed)
Epidural Patient location during procedure: OB Start time: 09/05/2021 10:41 PM End time: 09/05/2021 10:54 PM  Staffing Anesthesiologist: Lucretia Kern, MD Performed: anesthesiologist   Preanesthetic Checklist Completed: patient identified, IV checked, risks and benefits discussed, monitors and equipment checked, pre-op evaluation and timeout performed  Epidural Patient position: sitting Prep: DuraPrep Patient monitoring: heart rate, continuous pulse ox and blood pressure Approach: midline Location: L3-L4 Injection technique: LOR air  Needle:  Needle type: Tuohy  Needle gauge: 17 G Needle length: 9 cm Needle insertion depth: 7 cm Catheter type: closed end flexible Catheter size: 19 Gauge Catheter at skin depth: 12 cm Test dose: negative  Assessment Events: blood not aspirated, injection not painful, no injection resistance, no paresthesia and negative IV test  Additional Notes Reason for block:procedure for pain

## 2021-09-05 NOTE — H&P (Signed)
Scotty Pinder is a 31 y.o. female [phone] with IUP at 6w2dpresenting for IOL for CHTN, not on meds, and FGR (AC8%), now resolved. [facility]  She was prescribed labetalol at her initial PNV, but it lowered her bp too much, so she did not continue taking it.    Prenatal History/Complications: Early SAB x2 19week SAB after a placental abruption TAB X 1 Term SVD X 6 Hx GHTN    Past Medical History: Past Medical History:  Diagnosis Date   Abnormal Pap smear    Anemia    Anxiety    Asthma    Chlamydia 03/2012   Eczema    Gonorrhea    Hypertension    Kidney infection    PID (pelvic inflammatory disease)    Vaginal Pap smear, abnormal     Past Surgical History: Past Surgical History:  Procedure Laterality Date   DILATION AND CURETTAGE OF UTERUS     WISDOM TOOTH EXTRACTION      Obstetrical History: OB History     Gravida  11   Para  6   Term  6   Preterm  0   AB  4   Living  6      SAB  2   IAB  2   Ectopic  0   Multiple  0   Live Births  6           Social History: Social History   Socioeconomic History   Marital status: Single    Spouse name: Not on file   Number of children: Not on file   Years of education: Not on file   Highest education level: Not on file  Occupational History   Not on file  Tobacco Use   Smoking status: Never   Smokeless tobacco: Never  Vaping Use   Vaping Use: Never used  Substance and Sexual Activity   Alcohol use: Not Currently    Comment: occasionally   Drug use: Not Currently    Types: Marijuana    Comment: last 3/12   Sexual activity: Not Currently    Birth control/protection: None  Other Topics Concern   Not on file  Social History Narrative   Not on file   Social Determinants of Health   Financial Resource Strain: Not on file  Food Insecurity: Food Insecurity Present (08/28/2021)   Hunger Vital Sign    Worried About Running Out of Food in the Last Year: Often true    Ran Out of Food in  the Last Year: Often true  Transportation Needs: No Transportation Needs (08/28/2021)   PRAPARE - THydrologist(Medical): No    Lack of Transportation (Non-Medical): No  Physical Activity: Not on file  Stress: Not on file  Social Connections: Not on file    Family History: Family History  Problem Relation Age of Onset   Hypertension Mother    Heart attack Mother    Healthy Father    Hypertension Maternal Grandmother     Allergies: No Known Allergies  Medications Prior to Admission  Medication Sig Dispense Refill Last Dose   acetaminophen (TYLENOL) 325 MG tablet Take 650 mg by mouth every 6 (six) hours as needed.   Past Week   Prenatal Vit-Fe Phos-FA-Omega (VITAFOL GUMMIES) 3.33-0.333-34.8 MG CHEW Chew 1 tablet by mouth daily. 30 tablet 11 09/04/2021   Blood Pressure Monitoring (BLOOD PRESSURE KIT) DEVI 1 Device by Does not apply route as needed. (Patient not  taking: Reported on 08/18/2021) 1 each 0    cyclobenzaprine (FLEXERIL) 10 MG tablet Take 0.5-1 tablets (5-10 mg total) by mouth 3 (three) times daily as needed for muscle spasms. 20 tablet 0         Review of Systems   Constitutional: Negative for fever and chills Eyes: Negative for visual disturbances Respiratory: Negative for shortness of breath, dyspnea Cardiovascular: Negative for chest pain or palpitations  Gastrointestinal: Negative for abdominal pain, vomiting, diarrhea and constipation.   Genitourinary: Negative for dysuria and urgency Musculoskeletal: Negative for back pain, joint pain, myalgias  Neurological: Negative for dizziness and headaches      Blood pressure 125/67, pulse 77, height '5\' 4"'  (1.626 m), weight 89 kg, last menstrual period 12/11/2020, unknown if currently breastfeeding. General appearance: alert, cooperative, and no distress Lungs: normal respiratory effort Heart: regular rate and rhythm Abdomen: soft, non-tender; bowel sounds normal Extremities: Homans  sign is negative, no sign of DVT DTR's 2+ Presentation: cephalic Fetal monitoring  Baseline: 140 bpm, Variability: Good {> 6 bpm), Accelerations: Reactive, and Decelerations: Absent Uterine activity  None  Dilation: 1.5 Effacement (%): 60 Station: -3 Exam by:: Mary Martinique Johnson, RNC-OB   Prenatal labs: ABO, Rh: --/--/PENDING (06/30 0019) Antibody: PENDING (06/30 0019) Rubella: immune RPR: Non Reactive (04/27 0838)  HBsAg: Negative (12/23 1101)  HIV: Non Reactive (04/27 0838)  GBS: Positive/-- (06/22 1100)   Nursing Staff Provider  Office Location  [place] Dating  LMP  Language  English Anatomy US  Normal   Flu Vaccine  Declined Genetic/Carrier Screen  NIPS:   LR AFP: neg [facility]: SMA carrier  TDaP Vaccine   06/20/21 Hgb A1C or  GTT 5.0 Third trimester 72-88-69  COVID Vaccine    LAB RESULTS   Rhogam  n/a Blood Type O/Positive/-- (12/23 1101) O positive  Baby Feeding Plan Breastmilk pump Antibody Negative (12/23 1101)negative  Contraception Hormonal IUD, post placental Rubella 4.56 (12/23 1101)negative  Circumcision If boy, Yes  RPR Non Reactive (12/23 1101) negative  Pediatrician  List given HBsAg Negative (12/23 1101) negative  Support Person Ronald FOB  HCVAb  negative  Prenatal Classes NA HIV Non Reactive (12/23 1101)   negative  BTL Consent NA GBS  Positive  VBAC Consent NA Pap  12/22 normal HPV +, repeat in one year       DME Rx '[ ]'  BP cuff '[ ]'  Weight Scale Waterbirth  '[ ]'  Class '[ ]'  Consent '[ ]'  CNM visit  PHQ9 & GAD7 [  ] new OB [  ] 28 weeks  [  ] 36 weeks Induction  '[ ]'  Orders Entered '[ ]' Foley Y/N   Prenatal Transfer Tool  Maternal Diabetes: No Genetic Screening: Normal Maternal Ultrasounds/Referrals: Other: AC 8% at 34 weeks, now 31% Fetal Ultrasounds or other Referrals:  None Maternal Substance Abuse:  No Significant Maternal Medications:  None Significant Maternal Lab Results: Group B Strep positive    Results for orders placed or performed during  the hospital encounter of 09/05/21 (from the past 24 hour(s))  CBC   Collection Time: 09/05/21 12:19 AM  Result Value Ref Range   WBC 11.7 (H) 4.0 - 10.5 K/uL   RBC 3.69 (L) 3.87 - 5.11 MIL/uL   Hemoglobin 11.1 (L) 12.0 - 15.0 g/dL   HCT 32.4 (L) 36.0 - 46.0 %   MCV 87.8 80.0 - 100.0 fL   MCH 30.1 26.0 - 34.0 pg   MCHC 34.3 30.0 - 36.0 g/dL   RDW 12.5  11.5 - 15.5 %   Platelets 211 150 - 400 K/uL   nRBC 0.0 0.0 - 0.2 %  Type and screen   Collection Time: 09/05/21 12:19 AM  Result Value Ref Range   ABO/RH(D) PENDING    Antibody Screen PENDING    Sample Expiration      09/08/2021,2359 Performed at [facility], [place] [address], [place], [place] [address]     Assessment: Lacora Folmer is a 30 y.o. [id number] with an IUP at 19w2dpresenting for IOL for CHTN (no meds) and FGR at 34 weeks, resolved.  Plan: #Labor: Pt declines foley.  Will start GBS ppx and then cytotec #Pain:  Per request #FWB Cat 1   FChristin Fudge6/30/2023, 1:07 AM

## 2021-09-05 NOTE — Anesthesia Preprocedure Evaluation (Signed)
Anesthesia Evaluation  Patient identified by MRN, date of birth, ID band Patient awake    Reviewed: Allergy & Precautions, H&P , NPO status , Patient's Chart, lab work & pertinent test results  History of Anesthesia Complications Negative for: history of anesthetic complications  Airway Mallampati: II  TM Distance: >3 FB     Dental   Pulmonary asthma ,    Pulmonary exam normal        Cardiovascular hypertension,  Rhythm:regular Rate:Normal     Neuro/Psych negative neurological ROS  negative psych ROS   GI/Hepatic negative GI ROS, Neg liver ROS,   Endo/Other  negative endocrine ROS  Renal/GU negative Renal ROS  negative genitourinary   Musculoskeletal   Abdominal   Peds  Hematology negative hematology ROS (+)   Anesthesia Other Findings   Reproductive/Obstetrics (+) Pregnancy                             Anesthesia Physical Anesthesia Plan  ASA: 2  Anesthesia Plan: Epidural   Post-op Pain Management:    Induction:   PONV Risk Score and Plan:   Airway Management Planned:   Additional Equipment:   Intra-op Plan:   Post-operative Plan:   Informed Consent: I have reviewed the patients History and Physical, chart, labs and discussed the procedure including the risks, benefits and alternatives for the proposed anesthesia with the patient or authorized representative who has indicated his/her understanding and acceptance.       Plan Discussed with:   Anesthesia Plan Comments:         Anesthesia Quick Evaluation  

## 2021-09-06 ENCOUNTER — Encounter (HOSPITAL_COMMUNITY): Payer: Self-pay | Admitting: Obstetrics and Gynecology

## 2021-09-06 DIAGNOSIS — Z3A37 37 weeks gestation of pregnancy: Secondary | ICD-10-CM | POA: Diagnosis not present

## 2021-09-06 DIAGNOSIS — O365930 Maternal care for other known or suspected poor fetal growth, third trimester, not applicable or unspecified: Secondary | ICD-10-CM

## 2021-09-06 DIAGNOSIS — O1092 Unspecified pre-existing hypertension complicating childbirth: Secondary | ICD-10-CM

## 2021-09-06 DIAGNOSIS — O99824 Streptococcus B carrier state complicating childbirth: Secondary | ICD-10-CM | POA: Diagnosis not present

## 2021-09-06 MED ORDER — ONDANSETRON HCL 4 MG/2ML IJ SOLN: 4.0000 mg | INTRAMUSCULAR | Status: DC | PRN

## 2021-09-06 MED ORDER — ONDANSETRON HCL 4 MG PO TABS: 4.0000 mg | ORAL_TABLET | ORAL | Status: DC | PRN

## 2021-09-06 MED ORDER — SENNOSIDES-DOCUSATE SODIUM 8.6-50 MG PO TABS
2.0000 | ORAL_TABLET | Freq: Every day | ORAL | Status: DC
  Administered 2021-09-07 – 2021-09-08 (×2): 2 via ORAL
  Filled 2021-09-06 (×2): qty 2

## 2021-09-06 MED ORDER — COCONUT OIL OIL: 1.0000 | TOPICAL_OIL | Status: DC | PRN

## 2021-09-06 MED ORDER — LEVONORGESTREL 20 MCG/DAY IU IUD
1.0000 | INTRAUTERINE_SYSTEM | Freq: Once | INTRAUTERINE | Status: DC
Start: 2021-09-06 — End: 2021-09-06

## 2021-09-06 MED ORDER — DIPHENHYDRAMINE HCL 25 MG PO CAPS: 25.0000 mg | ORAL_CAPSULE | Freq: Four times a day (QID) | ORAL | Status: DC | PRN

## 2021-09-06 MED ORDER — IBUPROFEN 600 MG PO TABS
600.0000 mg | ORAL_TABLET | Freq: Four times a day (QID) | ORAL | Status: DC
  Administered 2021-09-06 – 2021-09-08 (×8): 600 mg via ORAL
  Filled 2021-09-06 (×9): qty 1

## 2021-09-06 MED ORDER — TERBUTALINE SULFATE 1 MG/ML IJ SOLN
INTRAMUSCULAR | Status: AC
  Administered 2021-09-06: 1 mg
  Filled 2021-09-06: qty 1

## 2021-09-06 MED ORDER — OXYTOCIN-SODIUM CHLORIDE 30-0.9 UT/500ML-% IV SOLN: 1.0000 m[IU]/min | INTRAVENOUS | Status: DC

## 2021-09-06 MED ORDER — ACETAMINOPHEN 325 MG PO TABS
650.0000 mg | ORAL_TABLET | ORAL | Status: DC | PRN
  Administered 2021-09-06: 650 mg via ORAL
  Filled 2021-09-06: qty 2

## 2021-09-06 MED ORDER — LACTATED RINGERS AMNIOINFUSION: INTRAVENOUS | Status: DC

## 2021-09-06 MED ORDER — TETANUS-DIPHTH-ACELL PERTUSSIS 5-2.5-18.5 LF-MCG/0.5 IM SUSY: 0.5000 mL | PREFILLED_SYRINGE | Freq: Once | INTRAMUSCULAR | Status: DC

## 2021-09-06 MED ORDER — DIBUCAINE (PERIANAL) 1 % EX OINT: 1.0000 | TOPICAL_OINTMENT | CUTANEOUS | Status: DC | PRN

## 2021-09-06 MED ORDER — OXYCODONE HCL 5 MG PO TABS
5.0000 mg | ORAL_TABLET | ORAL | Status: DC | PRN
  Administered 2021-09-06 – 2021-09-08 (×8): 5 mg via ORAL
  Filled 2021-09-06 (×8): qty 1

## 2021-09-06 MED ORDER — OXYCODONE-ACETAMINOPHEN 5-325 MG PO TABS
1.0000 | ORAL_TABLET | Freq: Once | ORAL | Status: AC
  Administered 2021-09-06: 1 via ORAL
  Filled 2021-09-06: qty 1

## 2021-09-06 MED ORDER — ZOLPIDEM TARTRATE 5 MG PO TABS: 5.0000 mg | ORAL_TABLET | Freq: Every evening | ORAL | Status: DC | PRN

## 2021-09-06 MED ORDER — BENZOCAINE-MENTHOL 20-0.5 % EX AERO: 1.0000 | INHALATION_SPRAY | CUTANEOUS | Status: DC | PRN

## 2021-09-06 MED ORDER — WITCH HAZEL-GLYCERIN EX PADS: 1.0000 | MEDICATED_PAD | CUTANEOUS | Status: DC | PRN

## 2021-09-06 MED ORDER — ACETAMINOPHEN 500 MG PO TABS
1000.0000 mg | ORAL_TABLET | Freq: Four times a day (QID) | ORAL | Status: DC
  Administered 2021-09-06 – 2021-09-07 (×5): 1000 mg via ORAL
  Filled 2021-09-06 (×7): qty 2

## 2021-09-06 MED ORDER — SIMETHICONE 80 MG PO CHEW: 80.0000 mg | CHEWABLE_TABLET | ORAL | Status: DC | PRN

## 2021-09-06 MED ORDER — PRENATAL MULTIVITAMIN CH
1.0000 | ORAL_TABLET | Freq: Every day | ORAL | Status: DC
  Administered 2021-09-06 – 2021-09-07 (×2): 1 via ORAL
  Filled 2021-09-06 (×2): qty 1

## 2021-09-06 NOTE — Progress Notes (Signed)
L&D Note  09/06/2021 - 8:47 AM  31 y.o. [id number] [redacted]w[redacted]d. Pregnancy complicated by cHTN (no meds), resolved FGR, difficult social situation  Patient Active Problem List   Diagnosis Date Noted   IUGR (intrauterine growth restriction) affecting care of mother 08/14/2021   BMI 35.0-35.9,adult 06/20/2021   Obesity in pregnancy 06/20/2021   Grand multiparity 05/23/2021   Alpha thalassemia silent carrier 03/20/2021   Carrier of spinal muscular atrophy 03/20/2021   Pap smear of cervix shows high risk HPV present 03/07/2021   Chronic hypertension affecting pregnancy 02/28/2021   Supervision of high risk pregnancy, antepartum 02/12/2021   Group B Streptococcus carrier, +RV culture, currently pregnant 10/12/2016   History of placenta abruption 05/21/2016   Asthma, mild intermittent 05/22/2014    Ms. Leydi Winstead is admitted for IOL for cHTN   Subjective:  Comfortable with epidural in place  Objective:   Vitals:   09/06/21 0753 09/06/21 0802 09/06/21 0832 09/06/21 0835  BP:  (!) 91/46 (!) 80/46 (!) 97/54  Pulse:  67 66 68  Resp:      Temp: 98.3 F (36.8 C)     TempSrc: Oral     Weight:      Height:        Current Vital Signs 24h Vital Sign Ranges  T 98.3 F (36.8 C) Temp  Avg: 97.9 F (36.6 C)  Min: 97.6 F (36.4 C)  Max: 98.3 F (36.8 C)  BP (!) 97/54 BP  Min: 80/46  Max: 136/77  HR 68 Pulse  Avg: 74.7  Min: 62  Max: 93  RR 18 Resp  Avg: 18  Min: 18  Max: 18  SaO2     No data recorded       24 Hour I/O Current Shift I/O  Time Ins Outs No intake/output data recorded. No intake/output data recorded.   FHR: 135 baseline, no accels, recurrent lates to the 90s-100s, mod variability Toco: q5-74m Gen: NAD SVE: 7/50/leading edge to -1 but BPD still high, no caput  Labs:  Recent Labs  Lab 09/05/21 0019 09/05/21 0831 09/05/21 2128  WBC 11.7* 9.1 11.9*  HGB 11.1* 10.3* 10.8*  HCT 32.4* 31.4* 33.1*  PLT 211 191 197    Medications Current Facility-Administered Medications   Medication Dose Route Frequency Provider Last Rate Last Admin   0.9 %  sodium chloride infusion  250 mL Intravenous PRN Edd Arbour R, CNM       acetaminophen (TYLENOL) tablet 650 mg  650 mg Oral Q4H PRN Olga Bing, MD       diphenhydrAMINE (BENADRYL) injection 12.5 mg  12.5 mg Intravenous Q15 min PRN Mellody Dance, MD       ePHEDrine injection 10 mg  10 mg Intravenous PRN Mellody Dance, MD       ePHEDrine injection 10 mg  10 mg Intravenous PRN Mellody Dance, MD       fentaNYL (SUBLIMAZE) injection 50 mcg  50 mcg Intravenous Q1H PRN [place] Bing, MD       fentaNYL 2 mcg/mL w/ bupivacaine 0.125% in NS 250 mL epidural infusion  12 mL/hr Epidural Continuous PRN Mellody Dance, MD 12 mL/hr at 09/05/21 2254 12 mL/hr at 09/05/21 2254   lactated ringers amnioinfusion   Intrauterine Continuous Arabella Merles, CNM 150 mL/hr at 09/06/21 0205 New Bag at 09/06/21 0205   lactated ringers infusion 500 mL  500 mL Intravenous Once Mellody Dance, MD       lactated ringers infusion 500-1,000  mL  500-1,000 mL Intravenous PRN [place] Bing, MD 999 mL/hr at 09/06/21 0747 500 mL at 09/06/21 0747   lactated ringers infusion   Intravenous Continuous [place] Bing, MD 125 mL/hr at 09/05/21 0052 New Bag at 09/05/21 0052   lidocaine (PF) (XYLOCAINE) 1 % injection 30 mL  30 mL Subcutaneous PRN [place] Bing, MD       misoprostol (CYTOTEC) tablet 50 mcg  50 mcg Buccal Q4H PRN Worthy Rancher, MD   50 mcg at 09/05/21 0535   ondansetron [facility]) injection 4 mg  4 mg Intravenous Q6H PRN [place] Bing, MD   4 mg at 09/06/21 0031   oxytocin (PITOCIN) IV BOLUS FROM BAG  333 mL Intravenous Once [place] Bing, MD       oxytocin (PITOCIN) IV infusion 30 units in NS 500 mL - Premix  2.5 Units/hr Intravenous Continuous [place] Bing, MD       oxytocin (PITOCIN) IV infusion 30 units in NS 500 mL - Premix  1-40 milli-units/min Intravenous Titrated Worthy Rancher, MD 3 mL/hr at 09/06/21 0818 3  milli-units/min at 09/06/21 0818   oxytocin (PITOCIN) IV infusion 30 units in NS 500 mL - Premix  1-40 milli-units/min Intravenous Titrated Jacklyn Shell, CNM   Stopped at 09/06/21 0154   oxytocin (PITOCIN) IV infusion 30 units in NS 500 mL - Premix  1-40 milli-units/min Intravenous Titrated Edd Arbour R, CNM       oxytocin (PITOCIN) IV infusion 30 units in NS 500 mL - Premix  1-40 milli-units/min Intravenous Titrated Cam Hai D, CNM 2 mL/hr at 09/06/21 0645 2 milli-units/min at 09/06/21 0645   penicillin G potassium 3 Million Units in dextrose 8mL IVPB  3 Million Units Intravenous Q4H Jacklyn Shell, CNM 100 mL/hr at 09/05/21 1345 3 Million Units at 09/05/21 1345   PHENYLephrine 80 mcg/ml in normal saline Adult IV Push Syringe (For Blood Pressure Support)  80 mcg Intravenous PRN Mellody Dance, MD   80 mcg at 09/06/21 0839   sodium chloride flush (NS) 0.9 % injection 3 mL  3 mL Intravenous Q12H Walker, Jamilla R, CNM       sodium chloride flush (NS) 0.9 % injection 3 mL  3 mL Intravenous PRN Walker, Jamilla R, CNM       sodium citrate-citric acid (ORACIT) solution 30 mL  30 mL Oral Q2H PRN [place] Bing, MD       terbutaline (BRETHINE) injection 0.25 mg  0.25 mg Subcutaneous Once PRN Dan Humphreys, Jamilla R, CNM       zolpidem (AMBIEN) tablet 5 mg  5 mg Oral QHS PRN Cresenzo-Dishmon, Scarlette Calico, CNM   5 mg at 09/05/21 0107   Facility-Administered Medications Ordered in Other Encounters  Medication Dose Route Frequency Provider Last Rate Last Admin   lidocaine (PF) (XYLOCAINE) 1 % injection   Epidural Anesthesia Intra-op Lucretia Kern, MD   4 mL at 09/05/21 2254    Assessment & Plan:  Pt progressing *Pregnancy: currently category II but with normal baseline and moderate variability. RN just gave some phenylephrine and BP in rooms better and EFM improved. Pitocin currently at 3. Will leave at that for now and continue with supportive measures and reassess in  1-1.5hours *GBS: continue PCN *Analgesia: epidural working well  Anticipate SVD. May need OVD. May want IUD after delivery.   Cornelia Copa MD Attending Center for [facility])

## 2021-09-06 NOTE — Lactation Note (Signed)
This note was copied from a baby's chart. Lactation Consultation Note  Patient Name: Ana [name] [id number] Date: 09/06/2021 Reason for consult: L&D Initial assessment;Early term 37-38.6wks;Difficult latch Age:32 hours  LC in to assist with first breastfeeding.  L&D RN in room assisting.  Baby with mouth on nipple and not sucking.  RN left room and LC assisted Mom to hand express colostrum onto nipple.  Mom winced with discomfort when LC performed this, taught Mom how to.  Baby unable to sustain a latch, but repeatedly sucking on nipple only.  Mom has short nipples that tend to retract with breast compression.  Recommended we try football hold. Baby started acting sleepy and Mom wanted to stop after 10 mins.  Baby placed STS on Mom's chest, encouraged Mom to keep baby STS as much as possible.  Mom stated she was very hungry, so meal tray access provided while baby sleeping STS on Mom.  Mom aware of LC help once on MBU.  Maternal Data Has patient been taught Hand Expression?: Yes Does the patient have breastfeeding experience prior to this delivery?: Yes  Feeding Mother's Current Feeding Choice: Breast Milk  LATCH Score Latch: Repeated attempts needed to sustain latch, nipple held in mouth throughout feeding, stimulation needed to elicit sucking reflex.  Audible Swallowing: None  Type of Nipple: Everted at rest and after stimulation (Short nipple)  Comfort (Breast/Nipple): Soft / non-tender  Hold (Positioning): Full assist, staff holds infant at breast  LATCH Score: 5   Interventions Interventions: Assisted with latch;Skin to skin;Breast massage;Hand express   Consult Status Consult Status: Follow-up from L&D Date: 09/06/21 Follow-up type: In-patient    Judee Clara 09/06/2021, 11:13 AM

## 2021-09-06 NOTE — Lactation Note (Signed)
This note was copied from a baby's chart. Lactation Consultation Note  Patient Name: Ana [name] [id number]'I Date: 09/06/2021 Reason for consult: Initial assessment;Infant < 6lbs;Early term 37-38.6wks Age:32 hours   P7 mother whose infant is now 83 hours old.  This is an early term infant at 37+3 weeks weighing < 6 lbs.  Mother's current feeding preference is breast/formula.  She breast fed her first child (now 57 years old) for 6 months.  The other children were formula fed.  Mother recently fed "Ana [name]" and feels like she latched well; baby was swaddled and asleep in the bassinet when I arrived.  Mother is able to hand express colostrum.  Suggested she continue to hand express and finger feed/spoon feed any expressed milk to "Ana [name]."  Reviewed breast feeding basics.  Encouraged feeding at least every three hours due to gestational age and weight and more often if baby desires.  Initial blood sugar reading was 55 mg/dl.  Discussed the benefits of beginning to pump with the electric pump.  Offered to initiate and mother receptive.  Pump parts, set up and cleaning reviewed.  #24 flange is appropriate at this time.  Mother denied pain with pumping.  Worked with mother to develop a feeding/pumping plan for today/tonight.  Father present and asleep in the chair.  Mother will call for any questions/concerns.  RN updated.   Maternal Data Has patient been taught Hand Expression?: Yes Does the patient have breastfeeding experience prior to this delivery?: Yes How long did the patient breastfeed?: 6 months with her first child  Feeding Mother's Current Feeding Choice: Breast Milk and Formula  LATCH Score Latch: Repeated attempts needed to sustain latch, nipple held in mouth throughout feeding, stimulation needed to elicit sucking reflex.  Audible Swallowing: None  Type of Nipple: Everted at rest and after stimulation (Short nipple)  Comfort (Breast/Nipple): Soft / non-tender  Hold (Positioning):  Full assist, staff holds infant at breast  LATCH Score: 5   Lactation Tools Discussed/Used Tools: Pump;Flanges Flange Size: 24 Breast pump type: Double-Electric Breast Pump;Manual Pump Education: Setup, frequency, and cleaning;Milk Storage Reason for Pumping: Breast stimulation for supplementation; baby early and < 6 lbs Pumping frequency: Every three hours  Interventions Interventions: Breast feeding basics reviewed;Education;LC Services brochure  Discharge Pump: Personal (Unsure of brand)  Consult Status Consult Status: Follow-up Date: 09/07/21 Follow-up type: In-patient    Ana [name] 09/06/2021, 2:31 PM

## 2021-09-06 NOTE — Progress Notes (Signed)
Patient ID: Ana [name], female   DOB: 1990-06-19, 31 y.o.   MRN: [id number]  In to tell pt goodbye; she is comfortable on her L side; Pitocin restarted at 0600 after ctx spaced out; amnioinfusion still running with good return per RN  BPs 110/58, 121/96 FHR 120-130s, good LTV, early variables w ctx Ctx irreg 5-67mins with Pit at 8mu/min Cx was 5/70/-2 per RN exam at start of Pit  IUP@37 .3wks SGA cHTN IOL process  Will continue to increase Pitocin 1x1; hopeful for cervical change and vag delivery, however pt aware we are watching FHR and may need to intervene  Arabella Merles CNM 09/06/2021

## 2021-09-06 NOTE — Discharge Summary (Signed)
Postpartum Discharge Summary  Date of Service updated***     Patient Name: Ana [name] DOB: May 18, 1990 MRN: [id number]  Date of admission: 09/05/2021 Delivery date:09/06/2021  Delivering provider: Starr Lake  Date of discharge: 09/06/2021  Admitting diagnosis: IUGR (intrauterine growth restriction) affecting care of mother [O36.5990] Intrauterine pregnancy: [redacted]w[redacted]d    Secondary diagnosis:  Active Problems:   Group B Streptococcus carrier, +RV culture, currently pregnant   Chronic hypertension affecting pregnancy   Alpha thalassemia silent carrier   Carrier of spinal muscular atrophy  Additional problems: None    Discharge diagnosis: Term Pregnancy Delivered                                              Post partum procedures: None Augmentation: AROM, Pitocin, and Cytotec Complications: None  Hospital course: Induction of Labor With Vaginal Delivery   31y.o. yo [id number] admitted to the hospital 09/05/2021 for induction of labor.  Indication for induction:  concern for FGR and CHTN no meds .  Patient had an uncomplicated labor course as follows: Membrane Rupture Time/Date: 7:45 PM ,09/05/2021   Delivery Method:Vaginal, Spontaneous  Episiotomy: None  Lacerations:  None  Details of delivery can be found in separate delivery note.  Patient had a routine postpartum course. Patient is discharged home 09/06/21.  Newborn Data: Birth date:09/06/2021  Birth time:10:14 AM  Gender:Female  Living status:Living  Apgars:8 ,9  Weight:   Magnesium Sulfate received: No BMZ received: No Rhophylac:No MMR:{MMR:30440033} T-DaP:{Tdap:23962} Flu: {F{HWE:99371}ransfusion:{Transfusion received:30440034}  Physical exam  Vitals:   09/06/21 0902 09/06/21 0932 09/06/21 1036 09/06/21 1047  BP: 108/85 (!) 105/54 (!) 124/50 112/72  Pulse: 71 69 79 66  Resp:      Temp:      TempSrc:      Weight:      Height:       General: {Exam; general:21111117} Lochia: {Desc;  appropriate/inappropriate:30686::"appropriate"} Uterine Fundus: {Desc; firm/soft:30687} Incision: {Exam; incision:21111123} DVT Evaluation: {Exam; dvt:2111122} Labs: Lab Results  Component Value Date   WBC 11.9 (H) 09/05/2021   HGB 10.8 (L) 09/05/2021   HCT 33.1 (L) 09/05/2021   MCV 88.7 09/05/2021   PLT 197 09/05/2021      Latest Ref Rng & Units 08/04/2021    9:03 PM  CMP  Glucose 70 - 99 mg/dL 77   BUN 6 - 20 mg/dL 7   Creatinine 0.44 - 1.00 mg/dL 0.67   Sodium 135 - 145 mmol/L 134   Potassium 3.5 - 5.1 mmol/L 3.4   Chloride 98 - 111 mmol/L 103   CO2 22 - 32 mmol/L 23   Calcium 8.9 - 10.3 mg/dL 8.8   Total Protein 6.5 - 8.1 g/dL 7.1   Total Bilirubin 0.3 - 1.2 mg/dL 0.4   Alkaline Phos 38 - 126 U/L 108   AST 15 - 41 U/L 20   ALT 0 - 44 U/L 17    Edinburgh Score:     No data to display           After visit meds:  Allergies as of 09/06/2021   No Known Allergies   Med Rec must be completed [facility]        Discharge home in stable condition Infant Feeding: {Baby feeding:23562} Infant Disposition:{CHL IP OB HOME WITH MOIRCVEL:38101}ischarge instruction: per After  Visit Summary and Postpartum booklet. Activity: Advance as tolerated. Pelvic rest for 6 weeks.  Diet: {OB OVZC:58850277} Future Appointments: Future Appointments  Date Time Provider [place]  09/22/2021 10:45 AM [facility]   Follow up Visit:   Please schedule this patient for a In person postpartum visit in  4-6  with the following provider:  KK . Additional Postpartum F/U: NA   High risk pregnancy complicated by:  CHTN and  FGR Delivery mode:  Vaginal, Spontaneous  Anticipated Birth Control:   depo before leaving the hospital and iud in the [facility] visit   09/06/2021 Starr Lake, CNM

## 2021-09-06 NOTE — Progress Notes (Signed)
Patient ID: Ana [name], female   DOB: 1990-12-02, 31 y.o.   MRN: [id number]  CTSP at 0141 to eval early variables that have been occurring x approx 30 mins. Exam showed cx unchanged (4/60/-2); IUPC inserted to assist in assessment of variables. FHR difficult to determine with cardio so IFSE placed without difficulty and began functioning to show return of FHR from decel to 60s. Dr Para March called to eval and terb given while Pit was stopped and fluid bolus started. Amnioinfusion started in next couple of minutes. FHR now stabilized x 30+ minutes; in the meantime mild hypotension noted and treated per anesthesia protocol.  Arabella Merles CNM 09/06/2021 2:32 AM

## 2021-09-06 NOTE — Anesthesia Postprocedure Evaluation (Signed)
Anesthesia Post Note  Patient: Ana [name]  Procedure(s) Performed: AN AD HOC LABOR EPIDURAL     Patient location during evaluation: Mother Baby Anesthesia Type: Epidural Level of consciousness: awake Pain management: satisfactory to patient Vital Signs Assessment: post-procedure vital signs reviewed and stable Respiratory status: spontaneous breathing Cardiovascular status: stable Anesthetic complications: no   No notable events documented.  Last Vitals:  Vitals:   09/06/21 1234 09/06/21 1355  BP: 110/66 121/72  Pulse: (!) 57 61  Resp: 18 18  Temp: 36.6 C 36.7 C  SpO2: 100% 100%    Last Pain:  Vitals:   09/06/21 1355  TempSrc: Oral  PainSc: 8    Pain Goal:                   [employer]

## 2021-09-06 NOTE — Progress Notes (Signed)
Anesthesia called to room per pt request because she expressed to RN "I do not feel comfortable with my legs being this numb". Pt also expressed concerns about low blood pressure and stated "nobody has been watching my blood pressures or baby and I do not feel safe falling asleep". Anesthesia provider assured her that pt blood pressure and baby's vital signs are consistently being monitored on central monitors throughout the hospital. Throughout conversation, pt became increasingly agitated and began to raise her voice at provider. RN made many attempts to deescalate after Anesthesia left. Pt aggression subsided, although still expressing on-going concern. Pt declined to speak with attending physician when offered. RN will continue to keep a close eye on the fetal heart rate and maternal vital signs and intervening when necessary, as well as communicating to the appropriate providers per unit policy.

## 2021-09-06 NOTE — Progress Notes (Signed)
Called by RN because patient feels she is too numb and is uncomfortable with it. I went to see the patient who expressed significant anxiety over her blood pressure which has required two doses of phenylephrine. She has not been symptomatic from hypotension but did have a prolonged decel which resolved. I explained that I can cut the epidural back a small amount but this will likely not resolve her low BP, and that to resolve the low BP it would need to be cut back significantly or turned off which would result in increased pain. She expressed that she is afraid to go to sleep because of her blood pressure. She also stated that she has never had pain and that she was told to get the epidural before she started having pain. This was not expressed to me at the time the epidural was placed. I again offered to adjust the epidural but she refused. I requested that her nurse administer an additional fluid bolus and reassured the patient multiple times that her BP and FHT are being monitored very carefully from multiple locations within the hospital, and that it is safe for her to go to sleep. After a long discussion she did not want any adjustments made to the epidural and continued to express anxiety and dissatisfaction with her situation. I advised her that I am available to make changes at any time if she decides that is what she wants, and in the meantime she will receive additional fluid and be monitored very closely.   Ana Kern, MD Anesthesiology 09/06/21 4:37 AM

## 2021-09-07 NOTE — Lactation Note (Signed)
This note was copied from a baby's chart. Lactation Consultation Note  Patient Name: Ana [name] [id number] Date: 09/07/2021   Age:32 hours  LC attempted to visit mom, but she was asleep. Baby in the bassinet. Will follow up later.   Maternal Data    Feeding Nipple Type: Nfant Slow Flow (purple)  LATCH Score                    Lactation Tools Discussed/Used    Interventions    Discharge    Consult Status      Ana [name] 09/07/2021, 4:11 PM

## 2021-09-07 NOTE — Progress Notes (Signed)
Post Partum Day 1 Subjective: Doing well. No acute events overnight. Having some back pain, but improved with pain medication.  Bleeding is appropriate. She is eating, drinking, voiding, and ambulating without issue. She is bottle feeding which is going well. She has no other concerns at this time.  Objective: Blood pressure 116/82, pulse 60, temperature 98 F (36.7 C), temperature source Oral, resp. rate 16, height 5\' 4"  (1.626 m), weight 89 kg, last menstrual period 12/11/2020, SpO2 100 %, unknown if currently breastfeeding.  Physical Exam:  General: alert, cooperative, and no distress CV: RRR Lungs: CTAB Lochia: appropriate Uterine Fundus: firm, below umbilicus DVT Evaluation: No evidence of DVT seen on physical exam.  Recent Labs    09/05/21 0831 09/05/21 2128  HGB 10.3* 10.8*  HCT 31.4* 33.1*    Assessment/Plan: Ana [name] is a 31 y.o. 32 on PPD# 1 s/p NSVD. Chronic HTN -no medication -BP within normal range, will continue to monitor  Progressing well. Meeting postpartum milestones. VSS. Continue routine postpartum care.  Feeding: bottle Contraception: Depot then outpatient IUD  Dispo: Desires to stay inpatient with baby, plan for discharge home on PPD#2   LOS: 2 days   [id number], DO Attending Obstetrician & Gynecologist, [facility] for Ana [name], [facility]

## 2021-09-07 NOTE — Progress Notes (Signed)
CSW attempted to meet with [name] to address consult for anxiety/depression. CSW introduced self and explained reason for consult. [name] declined to meet with CSW and shared that she is ready to go home. [name] reported that she is doing good and has been talking to a lot of people due to recent passing of her children's father. [name] denied any needs and declined PPD education. CSW encouraged [name] to reach out for support if needed, [name] agreed.   Danyka Merlin, LCSW Clinical Social Worker [facility] Cell#: [phone] 

## 2021-09-08 ENCOUNTER — Other Ambulatory Visit (INDEPENDENT_AMBULATORY_CARE_PROVIDER_SITE_OTHER): Payer: Self-pay | Admitting: Certified Nurse Midwife

## 2021-09-08 ENCOUNTER — Telehealth: Payer: Self-pay | Admitting: General Practice

## 2021-09-08 MED ORDER — OXYCODONE-ACETAMINOPHEN 5-325 MG PO TABS: 1.0000 | ORAL_TABLET | Freq: Four times a day (QID) | ORAL | 0 refills | Status: DC | PRN

## 2021-09-08 MED ORDER — OXYCODONE HCL 5 MG PO CAPS: 5.0000 mg | ORAL_CAPSULE | Freq: Four times a day (QID) | ORAL | 0 refills | Status: DC | PRN

## 2021-09-08 MED ORDER — IBUPROFEN 600 MG PO TABS: 600.0000 mg | ORAL_TABLET | Freq: Four times a day (QID) | ORAL | 0 refills | Status: DC

## 2021-09-08 NOTE — Lactation Note (Signed)
This note was copied from a baby's chart. Lactation Consultation Note  Patient Name: Ana [name] WIOMB'T Date: 09/08/2021 Reason for consult: Follow-up assessment;Difficult latch;Early term 37-38.6wks;Infant < 6lbs;Infant weight loss;Breastfeeding assistance (4.18% WL) Age:32 hours  P7, Early Term, Infant Female, <6lbs, 4.18%WL  LC entered the room and baby was asleep in the bassinet. Mom states that she has decided not to latch baby while in the hospital and has been giving baby Ana [name] formula. Mom says that she will try pumping and bottle feeding when she gets home.   Mom says that she is unsure if baby is getting anything when she is at the breast because baby was feeding very frequently. LC informed mom that in order to increase her supply, she should pump q3hrs or latch her baby. LC spoke with mom about milk production in the early days and supply and demand.   LC also discussed engorgement, breast care, infant I/O, warning signs, and outpatient services.   Mom states that she has no further questions or concerns.   Current Feeding Plan:  Pump q3hrs and feed milk to baby via bottle.  Supplement according to supplementation guidelines.  Watch baby's output and call the pediatrician with questions or concerns.  Contact outpatient LC for breastfeeding assistance.      Interventions Interventions: Breast feeding basics reviewed;Education  Discharge Discharge Education: Engorgement and breast care;Warning signs for feeding baby;Outpatient recommendation  Consult Status Consult Status: Complete Date: 09/08/21    Ana [name] 09/08/2021, 9:38 AM

## 2021-09-08 NOTE — Lactation Note (Signed)
This note was copied from a baby's chart. Lactation Consultation Note Mom had fallen asleep holding baby. Mom stated that every time she lays her in the bassinet the baby cries. Mom gave [facility] permission to lay baby in the bassinet. Baby kept sleeping. Mom stated things are getting better. Encouraged to call for assistance when awake if needed.  Patient Name: Ana [name] Ana [name] Date: 09/08/2021 Reason for consult: Follow-up assessment;Early term 37-38.6wks;Infant < 6lbs Age:54 hours  Maternal Data    Feeding Mother's Current Feeding Choice: Breast Milk and Formula  LATCH Score                    Lactation Tools Discussed/Used    Interventions    Discharge    Consult Status Consult Status: Follow-up Date: 09/08/21 Follow-up type: In-patient    Charyl Dancer 09/08/2021, 2:47 AM

## 2021-09-08 NOTE — BH Specialist Note (Signed)
Pt did not arrive to video visit and did not answer the phone; Unable to leave voicemail; left MyChart message for patient.   

## 2021-09-08 NOTE — Telephone Encounter (Signed)
Patient called into front office stating there is a problem with her oxycodone Rx at the pharmacy. Patient states the prescription is written for capsules and the pharmacy doesn't have that. Told patient I would reach out to the provider and have them change her prescription. Patient states the prescription that was sent in is only for a 2 day supply and she knows she is going to need more. She states ibuprofen or tylenol doesn't do anything for the pain, only oxycodone helps. Recommended to patient she take the ibuprofen every 6 hours then take 1 oxycodone if needed. Advised she do that for now and see how it goes and she can always call us back. Discussed she had a lot going on today and that certainly hasn't helped her pain but hopefully things would be better tomorrow. Patient verbalized understanding.

## 2021-09-08 NOTE — Progress Notes (Signed)
Patient anxious, discharge teaching attempted but not completed, patient eager to leave.

## 2021-09-10 ENCOUNTER — Encounter: Payer: Self-pay | Admitting: General Practice

## 2021-09-10 ENCOUNTER — Telehealth: Payer: Self-pay | Admitting: General Practice

## 2021-09-10 ENCOUNTER — Other Ambulatory Visit (INDEPENDENT_AMBULATORY_CARE_PROVIDER_SITE_OTHER): Payer: Self-pay | Admitting: Certified Nurse Midwife

## 2021-09-10 ENCOUNTER — Encounter: Payer: Self-pay | Admitting: Obstetrics and Gynecology

## 2021-09-10 MED ORDER — OXYCODONE-ACETAMINOPHEN 5-325 MG PO TABS: 1.0000 | ORAL_TABLET | Freq: Four times a day (QID) | ORAL | 0 refills | Status: DC | PRN

## 2021-09-10 NOTE — Telephone Encounter (Signed)
Patient called into front office stating the pharmacy still hasn't received her oxycodone Rx and she isn't sure why. Per chart review, Rx printed instead of being sent electronically.  Called patient and discussed with her. Told patient a provider in our office will send the prescription for her and I'll call the pharmacy to confirm they received it. Asked patient what her pain has been like since Monday and she said about the same. Reports back pain and cramping like a period rated at a 6-7 with taking ibuprofen 2-3 times a day. States she has not had a bowel movement yet and wants to know if that is normal. Discussed the bowels can slow down some after delivery. Recommended miralax once a day and/or colace once or twice a day. Patient verbalized understanding & states she has miralax at home and will take that. Patient asked who she should call with questions/concerns right now since she has delivered. Told patient she can call us. Patient verbalized understanding.

## 2021-09-10 NOTE — Addendum Note (Signed)
Addended by: Edd Arbour on: 09/10/2021 02:46 PM   Modules accepted: Orders

## 2021-09-15 ENCOUNTER — Telehealth (HOSPITAL_COMMUNITY): Payer: Self-pay | Admitting: *Deleted

## 2021-09-15 NOTE — Telephone Encounter (Signed)
Attempted Hospital Discharge Follow-up Call.  Busy signal.  Unable to leave a message.

## 2021-09-17 ENCOUNTER — Encounter: Payer: Self-pay | Admitting: Obstetrics and Gynecology

## 2021-09-18 ENCOUNTER — Other Ambulatory Visit: Payer: Self-pay | Admitting: Certified Nurse Midwife

## 2021-09-18 MED ORDER — OXYCODONE-ACETAMINOPHEN 5-325 MG PO TABS: 1.0000 | ORAL_TABLET | Freq: Four times a day (QID) | ORAL | 0 refills | Status: DC | PRN

## 2021-09-18 NOTE — Telephone Encounter (Signed)
Will refill this today, but will not approve additional refills without a visit in the office to assess why her pain is still so severe this remote from hospital discharge.

## 2021-09-22 ENCOUNTER — Ambulatory Visit: Payer: Medicaid Other | Admitting: Clinical

## 2021-09-22 DIAGNOSIS — Z91199 Patient's noncompliance with other medical treatment and regimen due to unspecified reason: Secondary | ICD-10-CM

## 2021-09-24 ENCOUNTER — Encounter: Payer: Self-pay | Admitting: Obstetrics and Gynecology

## 2021-09-24 ENCOUNTER — Inpatient Hospital Stay (HOSPITAL_COMMUNITY): Admit: 2021-09-24 | Payer: Self-pay

## 2021-10-16 ENCOUNTER — Other Ambulatory Visit: Payer: Self-pay

## 2021-10-16 ENCOUNTER — Ambulatory Visit (INDEPENDENT_AMBULATORY_CARE_PROVIDER_SITE_OTHER): Payer: Medicaid Other | Admitting: Student

## 2021-10-16 ENCOUNTER — Encounter: Payer: Self-pay | Admitting: Student

## 2021-10-16 VITALS — BP 130/74 | HR 64 | Wt 194.0 lb

## 2021-10-16 DIAGNOSIS — F32A Depression, unspecified: Secondary | ICD-10-CM

## 2021-10-16 DIAGNOSIS — Z3042 Encounter for surveillance of injectable contraceptive: Secondary | ICD-10-CM | POA: Diagnosis not present

## 2021-10-16 MED ORDER — MEDROXYPROGESTERONE ACETATE 150 MG/ML IM SUSP
150.0000 mg | Freq: Once | INTRAMUSCULAR | Status: AC
  Administered 2021-10-16: 150 mg via INTRAMUSCULAR

## 2021-10-16 NOTE — Progress Notes (Signed)
Post Partum Visit Note  Ana [name] is a 31 y.o. [id number] female who presents for a postpartum visit. She is 5 weeks postpartum following a normal spontaneous vaginal delivery.  I have fully reviewed the prenatal and intrapartum course. The delivery was at 37.3 gestational weeks.  Anesthesia: epidural. Postpartum course has been unventful. Baby is doing well. Baby is feeding by bottle - Similac Neosure. Bleeding thin lochia. Bowel function is normal. Bladder function is normal. Patient is not sexually active. Contraception method is none. Postpartum depression screening: negative.   The pregnancy intention screening data noted above was reviewed. Potential methods of contraception were discussed. The patient elected to proceed with No data recorded.   Edinburgh Postnatal Depression Scale - 10/16/21 1401       Edinburgh Postnatal Depression Scale:  In the Past 7 Days   I have been able to laugh and see the funny side of things. 0    I have looked forward with enjoyment to things. 0    I have blamed myself unnecessarily when things went wrong. 0    I have been anxious or worried for no good reason. 1    I have felt scared or panicky for no good reason. 0    Things have been getting on top of me. 0    I have been so unhappy that I have had difficulty sleeping. 1    I have felt sad or miserable. 1    I have been so unhappy that I have been crying. 1    The thought of harming myself has occurred to me. 0    Edinburgh Postnatal Depression Scale Total 4             Health Maintenance Due  Topic Date Due   COVID-19 Vaccine (1) Never done   INFLUENZA VACCINE  10/07/2021    The following portions of the patient's history were reviewed and updated as appropriate: allergies, current medications, past family history, past medical history, past social history, past surgical history, and problem list.  Review of Systems Pertinent items are noted in HPI.  Objective:  BP 130/74    Pulse 64   Wt 194 lb (88 kg)   LMP 12/11/2020 (Approximate)   Breastfeeding No   BMI 33.30 kg/m    General:  alert, cooperative, and no distress   Breasts:  normal  Lungs: clear to auscultation bilaterally  Heart:  regular rate and rhythm, S1, S2 normal, no murmur, click, rub or gallop  Abdomen: soft, non-tender; bowel sounds normal; no masses,  no organomegaly   Wound NA  GU exam:  abnormal         Assessment:   1. Depression, unspecified depression type   2. Encounter for surveillance of injectable contraceptive    Healthy postpartum exam.   Plan:   Essential components of care per ACOG recommendations:  1.  Mood and well being: Patient with negative depression screening today. Reviewed local resources for support.  - Patient tobacco use? No.   - hx of drug use? No.    2. Infant care and feeding:  -Patient currently breastmilk feeding? No.  -Social determinants of health (SDOH) reviewed in EPIC. No concerns  3. Sexuality, contraception and birth spacing - Patient does not want a pregnancy in the next year.  Desired family size is 7 children.  - Reviewed reproductive life planning. Reviewed contraceptive methods based on pt preferences and effectiveness.  Patient desired Hormonal Injection today.   -  Discussed birth spacing of 18 months  4. Sleep and fatigue -Encouraged family/partner/community support of 4 hrs of uninterrupted sleep to help with mood and fatigue  5. Physical Recovery  - Discussed patients delivery and complications. She describes her labor as Good.  - Patient had a Vaginal, no problems at delivery. Patient had a  none  laceration. Perineal healing reviewed. Patient expressed understanding - Patient has urinary incontinence? No. - Patient is safe to resume physical and sexual activity  6.  Health Maintenance - HM due items addressed No -   - Last pap smear  Diagnosis  Date Value Ref Range Status  02/28/2021   Final   - Negative for  intraepithelial lesion or malignancy (NILM)   Pap smear not done at today's visit. She will have her pap repeated in November 2023.  -Breast Cancer screening indicated? No.   7. Chronic Disease/Pregnancy Condition follow up: None  - PCP follow up  Marylene Land, CNM [facility], [facility]

## 2021-10-16 NOTE — Progress Notes (Signed)
Ana [name] here for Depo-Provera Injection. Injection administered without complication. Patient will return in 3 months for next injection between October 26 and Nov 9, 23.   Guy Begin, CMA 10/16/2021  2:42 PM

## 2021-10-22 NOTE — BH Specialist Note (Unsigned)
Integrated Behavioral Health via Telemedicine Visit  11/05/2021 Ana Ana [name] [id number]  Number of Integrated Behavioral Health Clinician visits: 3- Third Visit  Session Start time: 1019   Session End time: 1031  Total time in minutes: 12   Referring Provider: Scheryl Darter, MD Patient/Family location: Home [facility] Provider location: [facility]  All persons participating in visit: Patient Ana Ana [name] and Ana Ana [name]   Types of Service: Individual psychotherapy and Video visit  I connected with Ana Ana [name] and/or Ana Ana [name]  n/a  via  Telephone or Video Enabled Telemedicine Application  (Video is Caregility application) and verified that I am speaking with the correct person using two identifiers. Discussed confidentiality: Yes   I discussed the limitations of telemedicine and the availability of in person appointments.  Discussed there is a possibility of technology failure and discussed alternative modes of communication if that failure occurs.  I discussed that engaging in this telemedicine visit, they consent to the provision of behavioral healthcare and the services will be billed under their insurance.  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 Pt starts new Ana [name] 12/01/21; looking for daycare.  Duration of problem: Postpartum increase; Severity of problem:  Unknown; unable to assess further with pt phone issues  Patient and/or Family's Strengths/Protective Factors: Social connections, Concrete supports in place (healthy  food, safe environments, etc.), and Sense of purpose  Goals Addressed: Patient will:  Reduce symptoms of: anxiety, depression, and stress   Progress towards Goals: Ongoing  Interventions: Interventions utilized:  Supportive Reflection Standardized Assessments completed:  Unable to complete  Patient and/or Family Response: Patient agrees with treatment plan.   Assessment: Patient currently experiencing History of depression and anxiety; Grief; Psychosocial stress.   Patient may benefit from continued therapeutic interventions .  Plan: Follow up with behavioral health clinician on : Call Ana Ana [name] at [phone] to schedule follow up Behavioral recommendations:  --Continue prioritizing healthy self-care (regular meals, adequate rest; allowing practical help from supportive friends and family)  -Consider new mom support group as needed at either [email or web address] or [email or web address]   Referral(s): Integrated Art gallery manager (In Clinic) and [employer]:  new parent support  I discussed the assessment and treatment plan with the patient and/or parent/guardian. They were provided an opportunity to ask questions and all were answered. They agreed with the plan and demonstrated an understanding of the instructions.   They were advised to call back or seek an in-person evaluation if the symptoms worsen or if the condition fails to improve as anticipated.  Ana Lips, LCSW     08/28/2021   10:45 AM 08/18/2021    2:51 PM 08/13/2021   10:25 AM 05/23/2021    9:07 AM 04/25/2021   11:32 AM  Depression screen PHQ 2/9  Decreased Interest 1 1 1 1 1   Down, Depressed, Hopeless 1 1 1 1 1   PHQ - 2 Score 2 2 2 2 2   Altered sleeping 1 1 3 2 2   Tired, decreased energy 1 1 3 2 2   Change in appetite 1 1 3 2 2   Feeling bad or failure about yourself  1 1 1 1 2   Trouble concentrating 1 1 2 1 2   Moving slowly or fidgety/restless 0 0 2 0 1  Suicidal thoughts 0 0 0 0 0   PHQ-9 Score 7 7 16 10  13

## 2021-11-05 ENCOUNTER — Ambulatory Visit: Payer: Medicaid Other | Admitting: Clinical

## 2021-11-05 ENCOUNTER — Other Ambulatory Visit: Payer: Self-pay | Admitting: Advanced Practice Midwife

## 2021-11-05 ENCOUNTER — Other Ambulatory Visit: Payer: Self-pay | Admitting: Certified Nurse Midwife

## 2021-11-05 DIAGNOSIS — Z658 Other specified problems related to psychosocial circumstances: Secondary | ICD-10-CM

## 2021-11-05 DIAGNOSIS — Z8659 Personal history of other mental and behavioral disorders: Secondary | ICD-10-CM

## 2021-11-05 DIAGNOSIS — F4321 Adjustment disorder with depressed mood: Secondary | ICD-10-CM

## 2021-11-05 MED ORDER — IBUPROFEN 600 MG PO TABS: 600.0000 mg | ORAL_TABLET | Freq: Four times a day (QID) | ORAL | 1 refills | Status: DC

## 2021-11-05 NOTE — Patient Instructions (Signed)
[facility] [address] [place], [place] [address] [phone] (main office) [phone] (Azuri Bozard's office)  [place] Child Counsellor  (Childcare options, Early childcare development, etc.) [email or web address]  New Parent Support Groups [email or web address]

## 2021-11-05 NOTE — Telephone Encounter (Signed)
Called pt and discussed her request for Oxycodone refill.  She stated that she is on her cycle and having a lot of pain on her Lt side. She had tried some ibuprofen but has now run out. She also requests a refill of Flexeril - this has been sent to provider by her pharmacy. I advised that we cannot send in Rx for narcotic without an evaluation. I will send in Rx for Ibuprofen 600 mg and she should take this every 6 hrs for the next couple days. We will schedule Gyn appt for her but the next available is October. If the pain becomes worse, she should go to ED or urgent care for evaluation. Pt voiced understanding.

## 2021-12-22 ENCOUNTER — Ambulatory Visit: Payer: Medicaid Other | Admitting: Advanced Practice Midwife

## 2022-01-01 ENCOUNTER — Ambulatory Visit: Payer: Self-pay

## 2022-01-27 ENCOUNTER — Emergency Department (HOSPITAL_COMMUNITY): Payer: Medicaid Other

## 2022-01-27 ENCOUNTER — Emergency Department (HOSPITAL_COMMUNITY)
Admission: EM | Admit: 2022-01-27 | Discharge: 2022-01-27 | Disposition: A | Discharge status: Home / Self Care | Payer: Medicaid Other | Attending: Emergency Medicine | Admitting: Emergency Medicine

## 2022-01-27 ENCOUNTER — Encounter (HOSPITAL_COMMUNITY): Payer: Self-pay

## 2022-01-27 DIAGNOSIS — R000 Tachycardia, unspecified: Secondary | ICD-10-CM | POA: Insufficient documentation

## 2022-01-27 DIAGNOSIS — Z1152 Encounter for screening for COVID-19: Secondary | ICD-10-CM | POA: Diagnosis not present

## 2022-01-27 DIAGNOSIS — J45909 Unspecified asthma, uncomplicated: Secondary | ICD-10-CM | POA: Diagnosis not present

## 2022-01-27 DIAGNOSIS — D72829 Elevated white blood cell count, unspecified: Secondary | ICD-10-CM | POA: Diagnosis not present

## 2022-01-27 DIAGNOSIS — I10 Essential (primary) hypertension: Secondary | ICD-10-CM | POA: Diagnosis not present

## 2022-01-27 DIAGNOSIS — N12 Tubulo-interstitial nephritis, not specified as acute or chronic: Secondary | ICD-10-CM | POA: Insufficient documentation

## 2022-01-27 DIAGNOSIS — R109 Unspecified abdominal pain: Secondary | ICD-10-CM | POA: Diagnosis present

## 2022-01-27 LAB — COMPREHENSIVE METABOLIC PANEL
ALT: 15 U/L (ref 0–44)
AST: 18 U/L (ref 15–41)
Albumin: 4.1 g/dL (ref 3.5–5.0)
Alkaline Phosphatase: 66 U/L (ref 38–126)
Anion gap: 10 (ref 5–15)
BUN: 11 mg/dL (ref 6–20)
CO2: 22 mmol/L (ref 22–32)
Calcium: 9 mg/dL (ref 8.9–10.3)
Chloride: 99 mmol/L (ref 98–111)
Creatinine, Ser: 0.86 mg/dL (ref 0.44–1.00)
GFR, Estimated: >60 mL/min (ref >60)
Glucose, Bld: 98 mg/dL (ref 70–99)
Potassium: 3.9 mmol/L (ref 3.5–5.1)
Sodium: 131 mmol/L — ABNORMAL LOW (ref 135–145)
Total Bilirubin: 0.9 mg/dL (ref 0.3–1.2)
Total Protein: 7.8 g/dL (ref 6.5–8.1)

## 2022-01-27 LAB — CBC WITH DIFFERENTIAL/PLATELET
Abs Immature Granulocytes: 0.05 10*3/uL (ref 0.00–0.07)
Basophils Absolute: 0 10*3/uL (ref 0.0–0.1)
Basophils Relative: 0 %
Eosinophils Absolute: 0 10*3/uL (ref 0.0–0.5)
Eosinophils Relative: 0 %
HCT: 40.3 % (ref 36.0–46.0)
Hemoglobin: 12.8 g/dL (ref 12.0–15.0)
Immature Granulocytes: 0 %
Lymphocytes Relative: 9 %
Lymphs Abs: 1.2 10*3/uL (ref 0.7–4.0)
MCH: 28 pg (ref 26.0–34.0)
MCHC: 31.8 g/dL (ref 30.0–36.0)
MCV: 88.2 fL (ref 80.0–100.0)
Monocytes Absolute: 1.1 10*3/uL — ABNORMAL HIGH (ref 0.1–1.0)
Monocytes Relative: 9 %
Neutro Abs: 10.7 10*3/uL — ABNORMAL HIGH (ref 1.7–7.7)
Neutrophils Relative %: 82 %
Platelets: 220 10*3/uL (ref 150–400)
RBC: 4.57 MIL/uL (ref 3.87–5.11)
RDW: 13.1 % (ref 11.5–15.5)
WBC: 13.1 10*3/uL — ABNORMAL HIGH (ref 4.0–10.5)
nRBC: 0 % (ref 0.0–0.2)

## 2022-01-27 LAB — URINALYSIS, ROUTINE W REFLEX MICROSCOPIC
Bilirubin Urine: NEGATIVE
Glucose, UA: NEGATIVE mg/dL
Ketones, ur: NEGATIVE mg/dL
Nitrite: NEGATIVE
Protein, ur: 30 mg/dL — AB
Specific Gravity, Urine: 1.012 (ref 1.005–1.030)
WBC, UA: >50 WBC/hpf — ABNORMAL HIGH (ref 0–5)
pH: 7 (ref 5.0–8.0)

## 2022-01-27 LAB — RESP PANEL BY RT-PCR (RSV, FLU A&B, COVID)  RVPGX2
Influenza A by PCR: NEGATIVE
Influenza B by PCR: NEGATIVE
Resp Syncytial Virus by PCR: NEGATIVE
SARS Coronavirus 2 by RT PCR: NEGATIVE

## 2022-01-27 LAB — PREGNANCY, URINE: Preg Test, Ur: NEGATIVE

## 2022-01-27 LAB — LIPASE, BLOOD: Lipase: 27 U/L (ref 11–51)

## 2022-01-27 MED ORDER — SODIUM CHLORIDE (PF) 0.9 % IJ SOLN
INTRAMUSCULAR | Status: AC
  Administered 2022-01-27: 10 mL
  Filled 2022-01-27: qty 50

## 2022-01-27 MED ORDER — ONDANSETRON HCL 4 MG/2ML IJ SOLN
4.0000 mg | Freq: Once | INTRAMUSCULAR | Status: AC
  Administered 2022-01-27: 4 mg via INTRAVENOUS
  Filled 2022-01-27: qty 2

## 2022-01-27 MED ORDER — MORPHINE SULFATE (PF) 4 MG/ML IV SOLN
4.0000 mg | Freq: Once | INTRAVENOUS | Status: AC
  Administered 2022-01-27: 4 mg via INTRAVENOUS
  Filled 2022-01-27: qty 1

## 2022-01-27 MED ORDER — HYDROCODONE-ACETAMINOPHEN 5-325 MG PO TABS: 2.0000 | ORAL_TABLET | ORAL | 0 refills | Status: DC | PRN

## 2022-01-27 MED ORDER — IOHEXOL 300 MG/ML  SOLN
100.0000 mL | Freq: Once | INTRAMUSCULAR | Status: AC | PRN
  Administered 2022-01-27: 100 mL via INTRAVENOUS

## 2022-01-27 MED ORDER — PROCHLORPERAZINE EDISYLATE 10 MG/2ML IJ SOLN
10.0000 mg | Freq: Once | INTRAMUSCULAR | Status: AC
  Administered 2022-01-27: 10 mg via INTRAVENOUS
  Filled 2022-01-27: qty 2

## 2022-01-27 MED ORDER — LACTATED RINGERS IV BOLUS
2000.0000 mL | Freq: Once | INTRAVENOUS | Status: AC
  Administered 2022-01-27: 2000 mL via INTRAVENOUS

## 2022-01-27 MED ORDER — KETOROLAC TROMETHAMINE 15 MG/ML IJ SOLN
15.0000 mg | Freq: Once | INTRAMUSCULAR | Status: AC
  Administered 2022-01-27: 15 mg via INTRAVENOUS
  Filled 2022-01-27: qty 1

## 2022-01-27 MED ORDER — SODIUM CHLORIDE 0.9 % IV SOLN
1.0000 g | Freq: Once | INTRAVENOUS | Status: AC
  Administered 2022-01-27: 1 g via INTRAVENOUS
  Filled 2022-01-27: qty 10

## 2022-01-27 MED ORDER — ONDANSETRON 4 MG PO TBDP: 4.0000 mg | ORAL_TABLET | Freq: Three times a day (TID) | ORAL | 0 refills | Status: DC | PRN

## 2022-01-27 MED ORDER — CEPHALEXIN 500 MG PO CAPS: 500.0000 mg | ORAL_CAPSULE | Freq: Four times a day (QID) | ORAL | 0 refills | Status: AC

## 2022-01-27 MED ORDER — ACETAMINOPHEN 325 MG PO TABS
650.0000 mg | ORAL_TABLET | Freq: Once | ORAL | Status: AC
  Administered 2022-01-27: 650 mg via ORAL
  Filled 2022-01-27: qty 2

## 2022-01-27 NOTE — Discharge Instructions (Signed)
Your work-up today showed you have kidney infection (pyelonephritis).  You received a dose of IV antibiotics, pain medication, fluids in the emergency room with improvement in your symptoms.  As discussed your CT scan also shows a lesion on your kidney which may be a result of your infection.  However I have given you follow-up with urology to ensure that this resolves and that there is no ongoing concern for cancer.  Schedule a follow-up appointment with them.  For any concerning symptoms return to the emergency room.  I have sent antibiotic, pain medication, nausea medication into the pharmacy for you.  Follow-up with your primary care provider.

## 2022-01-27 NOTE — ED Notes (Signed)
Pt unable to give urine sample

## 2022-01-27 NOTE — ED Provider Notes (Signed)
[facility] DEPT Provider Note   CSN: [id number] Arrival date & time: 01/27/22  3825     History  Chief Complaint  Patient presents with   Abdominal Pain    Ana [name] is a 31 y.o. female.  31 year old female presents today for evaluation of right-sided flank pain, nausea, and fever for 2-day duration.  She states about a week ago she had some right-sided pain however this resolved after increasing her hydration.  However over the past 2 days she has not been able to get any relief.  Last took Tylenol about 9 PM last night.  She states symptoms feel similar to her prior kidney infection.  Denies any prior abdominal surgeries.  The history is provided by the patient. No language interpreter was used.       Home Medications Prior to Admission medications   Medication Sig Start Date End Date Taking? Authorizing Provider  acetaminophen (TYLENOL) 325 MG tablet Take 650 mg by mouth every 6 (six) hours as needed. Patient not taking: Reported on 10/16/2021    [provider]  Blood Pressure Monitoring (BLOOD PRESSURE KIT) DEVI 1 Device by Does not apply route as needed. Patient not taking: Reported on 08/18/2021 02/19/21   Chancy Milroy, MD  cyclobenzaprine (FLEXERIL) 10 MG tablet Take 0.5-1 tablets (5-10 mg total) by mouth 3 (three) times daily as needed for muscle spasms. 08/27/21   Leftwich-Kirby, Kathie Dike, CNM  ibuprofen (ADVIL) 600 MG tablet Take 1 tablet (600 mg total) by mouth every 6 (six) hours. 11/05/21   Starr Lake, CNM  oxyCODONE-acetaminophen (PERCOCET/ROXICET) 5-325 MG tablet Take 1 tablet by mouth every 6 (six) hours as needed. Patient not taking: Reported on 10/16/2021 09/18/21   Gabriel Carina, CNM  Prenatal Vit-Fe Phos-FA-Omega (VITAFOL GUMMIES) 3.33-0.333-34.8 MG CHEW Chew 1 tablet by mouth daily. 03/05/21   Gabriel Carina, CNM      Allergies    Patient has no known allergies.    Review of Systems   Review  of Systems  Constitutional:  Positive for appetite change, chills, fatigue and fever.  Gastrointestinal:  Positive for abdominal pain, constipation and nausea. Negative for blood in stool and vomiting.  Genitourinary:  Positive for flank pain. Negative for dysuria.  All other systems reviewed and are negative.   Physical Exam Updated Vital Signs BP (!) 136/93 (BP Location: Right Arm)   Pulse (!) 106   Temp (!) 102.6 F (39.2 C) (Oral)   Resp 18   SpO2 99%  Physical Exam Vitals and nursing note reviewed.  Constitutional:      General: She is not in acute distress.    Appearance: Normal appearance. She is not ill-appearing.  HENT:     Head: Normocephalic and atraumatic.     Nose: Nose normal.  Eyes:     General: No scleral icterus.    Extraocular Movements: Extraocular movements intact.     Conjunctiva/sclera: Conjunctivae normal.  Cardiovascular:     Rate and Rhythm: Regular rhythm. Tachycardia present.     Pulses: Normal pulses.  Pulmonary:     Effort: Pulmonary effort is normal. No respiratory distress.     Breath sounds: Normal breath sounds. No wheezing or rales.  Abdominal:     General: There is no distension.     Tenderness: There is abdominal tenderness. There is right CVA tenderness. There is no left CVA tenderness, guarding or rebound.  Musculoskeletal:        General: Normal range of  motion.     Cervical back: Normal range of motion.     Right lower leg: No edema.     Left lower leg: No edema.  Skin:    General: Skin is warm and dry.  Neurological:     General: No focal deficit present.     Mental Status: She is alert. Mental status is at baseline.     ED Results / Procedures / Treatments   Labs (all labs ordered are listed, but only abnormal results are displayed) Labs Reviewed  RESP PANEL BY RT-PCR (RSV, FLU A&B, COVID)  RVPGX2  CBC WITH DIFFERENTIAL/PLATELET  COMPREHENSIVE METABOLIC PANEL  LIPASE, BLOOD  URINALYSIS, ROUTINE W REFLEX MICROSCOPIC     EKG None  Radiology No results found.  Procedures Procedures    Medications Ordered in ED Medications  lactated ringers bolus 2,000 mL (has no administration in time range)  ondansetron (ZOFRAN) injection 4 mg (4 mg Intravenous Given 01/27/22 0656)  morphine (PF) 4 MG/ML injection 4 mg (4 mg Intravenous Given 01/27/22 0657)  acetaminophen (TYLENOL) tablet 650 mg (650 mg Oral Given 01/27/22 4403)    ED Course/ Medical Decision Making/ A&P Clinical Course as of 01/27/22 1101  Tue Jan 27, 2022  0845 CBC shows leukocytosis at 13.1 no significant left shift.  No anemia.  CMP shows sodium of 131 otherwise unremarkable.  UA does show evidence of a UTI.  Lipase within normal limits.  CT abdomen pelvis pending.  COVID and flu negative.  Given the UA findings, flank pain we will go ahead and give dose of Rocephin.  On reevaluation she reports she does not feel any better.  Reports ongoing headache.  Will give migraine cocktail. [AA]    Clinical Course User Index [AA] Evlyn Courier, PA-C                           Medical Decision Making Amount and/or Complexity of Data Reviewed Labs: ordered. Radiology: ordered.  Risk OTC drugs. Prescription drug management.   Medical Decision Making / ED Course   This patient presents to the ED for concern of abdominal pain/right flank pain, this involves an extensive number of treatment options, and is a complaint that carries with it a high risk of complications and morbidity.  The differential diagnosis includes pyelonephritis, UTI, cholecystitis, nephrolithiasis, PID, appendicitis  MDM: 31 year old female presents today for evaluation of right-sided flank pain abdominal pain for the past 2 to 3 days with associated nausea but no vomiting.  Denies dysuria, but states she has frequency and decreased urine each time which is consistent with her prior UTI/pyelonephritis.  She does have right CVA tenderness as well as right upper quadrant  tenderness.  She is without any URI symptoms.  Given the fever and her headache we will add on respiratory panel.  Will give fluids, pain medication, and obtain blood work and imaging.  Patient on reevaluation reports improvement.  CT abdomen pelvis does show evidence of pyelonephritis.  Also showed does a 1.5 cm lesion on the kidney.  Could be related to infection versus malignancy.  Urology referral given.  Patient is appropriate for discharge.  Will discharge with Keflex.  Urine culture ordered.  Will give short course of pain control along with Zofran.  Patient is agreeable with plan.  Discussed follow-up with her primary care provider as well as the urologist.   Medications were sent in after patient was discharged.  I did call patient personally  to notify her that her medications were sent in.    Lab Tests: -I ordered, reviewed, and interpreted labs.   The pertinent results include:   Labs Reviewed  RESP PANEL BY RT-PCR (RSV, FLU A&B, COVID)  RVPGX2  CBC WITH DIFFERENTIAL/PLATELET  COMPREHENSIVE METABOLIC PANEL  LIPASE, BLOOD  URINALYSIS, ROUTINE W REFLEX MICROSCOPIC      EKG  EKG Interpretation  Date/Time:    Ventricular Rate:    PR Interval:    QRS Duration:   QT Interval:    QTC Calculation:   R Axis:     Text Interpretation:           Imaging Studies ordered: I ordered imaging studies including CT abdomen pelvis with contrast I independently visualized and interpreted imaging. I agree with the radiologist interpretation   Medicines ordered and prescription drug management: Meds ordered this encounter  Medications   lactated ringers bolus 2,000 mL   ondansetron (ZOFRAN) injection 4 mg   morphine (PF) 4 MG/ML injection 4 mg   acetaminophen (TYLENOL) tablet 650 mg    -I have reviewed the patients home medicines and have made adjustments as needed  Critical interventions IV fluids, antibiotics, pain control   Reevaluation: After the interventions noted  above, I reevaluated the patient and found that they have :improved  Co morbidities that complicate the patient evaluation  Past Medical History:  Diagnosis Date   Abnormal Pap smear    Anemia    Anxiety    Asthma    Chlamydia 03/2012   Eczema    Gonorrhea    Hypertension    Kidney infection    PID (pelvic inflammatory disease)    Vaginal Pap smear, abnormal       Dispostion: Patient is appropriate for discharge.  Discharged in stable condition.  Return precaution discussed.  Patient voices understanding and is in agreement with plan.  Admission considered however patient improved.  Tachycardia resolved with improvement in fever.  Final Clinical Impression(s) / ED Diagnoses Final diagnoses:  Pyelonephritis    Rx / DC Orders ED Discharge Orders          Ordered    cephALEXin (KEFLEX) 500 MG capsule  4 times daily        01/27/22 1100    HYDROcodone-acetaminophen (NORCO/VICODIN) 5-325 MG tablet  Every 4 hours PRN        01/27/22 1100    ondansetron (ZOFRAN-ODT) 4 MG disintegrating tablet  Every 8 hours PRN        01/27/22 1100              Evlyn Courier, PA-C 01/27/22 1103    Pattricia Boss, MD 01/28/22 0725

## 2022-01-27 NOTE — ED Triage Notes (Signed)
Patient arrived with complaints of fever, right sided lower abdominal pain. Last dose of tylenol 6 hours ago.

## 2022-01-29 LAB — URINE CULTURE: Culture: 80000 — AB

## 2022-01-30 ENCOUNTER — Telehealth (HOSPITAL_BASED_OUTPATIENT_CLINIC_OR_DEPARTMENT_OTHER): Payer: Self-pay

## 2022-01-30 NOTE — Telephone Encounter (Signed)
Post ED Visit - Positive Culture Follow-up  Culture report reviewed by antimicrobial stewardship pharmacist: [facility] []  [address] Pharm.D. []  [place], Pharm.D., BCPS AQ-ID []  , Pharm.D., BCPS []  Celedonio Miyamoto, Pharm.D., BCPS []  [place], Garvin Fila.D., BCPS, AAHIVP []  , Pharm.D., BCPS, AAHIVP []  Georgina Pillion, PharmD, BCPS []  , PharmD, BCPS []  [place], PharmD, BCPS []  [address], PharmD []  , PharmD, BCPS []  Estella Husk, PharmD  Pharmacy Team [x]  Lysle Pearl, PharmD []  , PharmD []  Phillips Climes, PharmD []  , Rph []  Agapito Games) , PharmD []  Verlan Friends, PharmD []  , PharmD []  Mervyn Gay, PharmD []  , PharmD []  Vinnie Level, PharmD []  [facility], PharmD []  , PharmD []  Severiano Gilbert, PharmD   Positive urine culture Treated with Cephalexin, organism sensitive to the same and no further patient follow-up is required at this time.  01/30/2022, 8:14 AM

## 2022-07-11 ENCOUNTER — Encounter (HOSPITAL_COMMUNITY): Payer: Self-pay | Admitting: Physician Assistant

## 2022-07-11 ENCOUNTER — Ambulatory Visit (HOSPITAL_COMMUNITY)
Admission: EM | Admit: 2022-07-11 | Discharge: 2022-07-11 | Disposition: A | Discharge status: Home / Self Care | Payer: Medicaid Other | Attending: Physician Assistant | Admitting: Physician Assistant

## 2022-07-11 ENCOUNTER — Ambulatory Visit (INDEPENDENT_AMBULATORY_CARE_PROVIDER_SITE_OTHER): Payer: Medicaid Other

## 2022-07-11 DIAGNOSIS — Z113 Encounter for screening for infections with a predominantly sexual mode of transmission: Secondary | ICD-10-CM

## 2022-07-11 DIAGNOSIS — M25561 Pain in right knee: Secondary | ICD-10-CM

## 2022-07-11 LAB — HIV ANTIBODY (ROUTINE TESTING W REFLEX): HIV Screen 4th Generation wRfx: NONREACTIVE

## 2022-07-11 MED ORDER — PREDNISONE 10 MG (21) PO TBPK: ORAL_TABLET | ORAL | 0 refills | Status: DC

## 2022-07-11 NOTE — ED Triage Notes (Signed)
STI -testing R-knee pain x3 weeks   Pt states that she is having right knee pain. She broke her toe about a month ago. Feels like she has fluid on it.   STI testing. No sx. Had unprotected sex a few weeks ago.
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
## 2022-07-11 NOTE — Discharge Instructions (Signed)
Your x-ray was normal.  I am concerned about your meniscus or another soft tissue injury.  Use the brace for comfort and support.  Take prednisone to help with pain and inflammation.  Do not take NSAIDs with this medication including aspirin, ibuprofen/Advil, naproxen/Aleve.  You can use Tylenol/acetaminophen for additional symptom relief.  If her symptoms are not improving quickly please follow-up with orthopedics.  Call to schedule an appointment.  We will contact you if any of your lab work is abnormal.  Please abstain from sex until you receive the results.  Use a condom with each sexual encounter.  If you develop any symptoms please return for reevaluation.

## 2022-07-12 LAB — RPR: RPR Ser Ql: NONREACTIVE

## 2022-07-13 ENCOUNTER — Telehealth (HOSPITAL_COMMUNITY): Payer: Self-pay | Admitting: Emergency Medicine

## 2022-07-13 LAB — CERVICOVAGINAL ANCILLARY ONLY
Bacterial Vaginitis (gardnerella): POSITIVE — AB
Candida Glabrata: NEGATIVE
Candida Vaginitis: NEGATIVE
Chlamydia: NEGATIVE
Comment: NEGATIVE
Comment: NEGATIVE
Comment: NEGATIVE
Comment: NEGATIVE
Comment: NEGATIVE
Comment: NORMAL
Neisseria Gonorrhea: NEGATIVE
Trichomonas: NEGATIVE

## 2022-07-13 MED ORDER — METRONIDAZOLE 500 MG PO TABS: 500.0000 mg | ORAL_TABLET | Freq: Two times a day (BID) | ORAL | 0 refills | Status: DC

## 2022-08-31 ENCOUNTER — Encounter: Payer: Medicaid Other | Admitting: Obstetrics and Gynecology

## 2022-09-04 ENCOUNTER — Ambulatory Visit (INDEPENDENT_AMBULATORY_CARE_PROVIDER_SITE_OTHER): Payer: Medicaid Other | Admitting: Family Medicine

## 2022-09-04 ENCOUNTER — Other Ambulatory Visit: Payer: Self-pay

## 2022-09-04 ENCOUNTER — Encounter: Payer: Self-pay | Admitting: Family Medicine

## 2022-09-04 ENCOUNTER — Other Ambulatory Visit (HOSPITAL_COMMUNITY)
Admission: RE | Admit: 2022-09-04 | Discharge: 2022-09-04 | Disposition: A | Discharge status: Home / Self Care | Payer: Medicaid Other | Source: Ambulatory Visit | Attending: Family Medicine | Admitting: Family Medicine

## 2022-09-04 VITALS — BP 125/79 | HR 68 | Wt 203.3 lb

## 2022-09-04 DIAGNOSIS — Z113 Encounter for screening for infections with a predominantly sexual mode of transmission: Secondary | ICD-10-CM | POA: Diagnosis not present

## 2022-09-04 DIAGNOSIS — Z124 Encounter for screening for malignant neoplasm of cervix: Secondary | ICD-10-CM | POA: Insufficient documentation

## 2022-09-04 DIAGNOSIS — Z30017 Encounter for initial prescription of implantable subdermal contraceptive: Secondary | ICD-10-CM | POA: Diagnosis not present

## 2022-09-04 DIAGNOSIS — Z01419 Encounter for gynecological examination (general) (routine) without abnormal findings: Secondary | ICD-10-CM | POA: Diagnosis not present

## 2022-09-04 LAB — POCT PREGNANCY, URINE: Preg Test, Ur: NEGATIVE

## 2022-09-04 MED ORDER — ETONOGESTREL 68 MG ~~LOC~~ IMPL
68.0000 mg | DRUG_IMPLANT | Freq: Once | SUBCUTANEOUS | Status: AC
Start: 2022-09-04 — End: 2022-09-04
  Administered 2022-09-04: 68 mg via SUBCUTANEOUS

## 2022-09-04 NOTE — Patient Instructions (Signed)

## 2022-09-04 NOTE — Progress Notes (Signed)
Subjective:     Ana [name] is a 32 y.o. female and is here for a comprehensive physical exam. The patient reports no problems.   The following portions of the patient's history were reviewed and updated as appropriate: allergies, current medications, past family history, past medical history, past social history, past surgical history, and problem list.  Review of Systems Pertinent items noted in HPI and remainder of comprehensive ROS otherwise negative.   Objective:  Chaperone present for exam   BP 125/79   Pulse 68   Wt 203 lb 4.8 oz (92.2 kg)   LMP 08/31/2022 (Approximate)   BMI 34.90 kg/m  General appearance: alert, cooperative, and appears stated age Head: Normocephalic, without obvious abnormality, atraumatic Neck: no adenopathy, supple, symmetrical, trachea midline, and thyroid not enlarged, symmetric, no tenderness/mass/nodules Lungs: clear to auscultation bilaterally Breasts: normal appearance, no masses or tenderness Heart: regular rate and rhythm, S1, S2 normal, no murmur, click, rub or gallop Abdomen: soft, non-tender; bowel sounds normal; no masses,  no organomegaly Pelvic: cervix normal in appearance, external genitalia normal, no adnexal masses or tenderness, no cervical motion tenderness, uterus normal size, shape, and consistency, and vagina normal without discharge Extremities: extremities normal, atraumatic, no cyanosis or edema Pulses: 2+ and symmetric Skin: Skin color, texture, turgor normal. No rashes or lesions Lymph nodes: Cervical, supraclavicular, and axillary nodes normal. Neurologic: Grossly normal    Procedure: Patient given informed consent, signed copy in the chart, time out was performed. Pregnancy test was negative. LMP was last week. Appropriate time out taken.  Patient's right arm was prepped and draped in the usual sterile fashion.. The ruler used to measure and mark insertion area.  Pt was prepped with alcohol swab and then injected with 3 cc  of 1% lidocaine with epinephrine.  Pt was prepped with betadine, Nexplanon removed form packaging,  Device confirmed in needle, then inserted full length of needle and withdrawn per handbook instructions.  Pt insertion site covered with steristrips.   Minimal blood loss.  Pt tolerated the procedure well.   Assessment:    Healthy female exam.      Plan:  Screening examination for STD (sexually transmitted disease) - Plan: RPR, HIV Antibody (routine testing w rflx), Hepatitis C Antibody, Hepatitis B Surface AntiGEN  Papanicolaou smear for cervical cancer screening - Plan: Cytology - PAP( )  Insertion of Nexplanon - Plan: etonogestrel (NEXPLANON) implant 68 mg  Encounter for gynecological examination without abnormal finding    See After Visit Summary for Counseling Recommendations

## 2022-09-05 LAB — HEPATITIS C ANTIBODY: Hep C Virus Ab: NONREACTIVE

## 2022-09-05 LAB — HIV ANTIBODY (ROUTINE TESTING W REFLEX): HIV Screen 4th Generation wRfx: NONREACTIVE

## 2022-09-05 LAB — RPR: RPR Ser Ql: NONREACTIVE

## 2022-09-05 LAB — HEPATITIS B SURFACE ANTIGEN: Hepatitis B Surface Ag: NEGATIVE

## 2022-09-09 ENCOUNTER — Encounter: Payer: Self-pay | Admitting: Family Medicine

## 2022-09-09 LAB — CYTOLOGY - PAP
Chlamydia: NEGATIVE
Comment: NEGATIVE
Comment: NEGATIVE
Comment: NEGATIVE
Comment: NORMAL
Diagnosis: NEGATIVE
High risk HPV: NEGATIVE
Neisseria Gonorrhea: NEGATIVE
Trichomonas: NEGATIVE

## 2022-11-30 ENCOUNTER — Emergency Department (HOSPITAL_BASED_OUTPATIENT_CLINIC_OR_DEPARTMENT_OTHER): Payer: Medicaid Other

## 2022-11-30 ENCOUNTER — Emergency Department (HOSPITAL_BASED_OUTPATIENT_CLINIC_OR_DEPARTMENT_OTHER)
Admission: EM | Admit: 2022-11-30 | Discharge: 2022-11-30 | Disposition: A | Discharge status: Home / Self Care | Payer: Medicaid Other | Attending: Emergency Medicine | Admitting: Emergency Medicine

## 2022-11-30 ENCOUNTER — Encounter (HOSPITAL_BASED_OUTPATIENT_CLINIC_OR_DEPARTMENT_OTHER): Payer: Self-pay | Admitting: Pediatrics

## 2022-11-30 ENCOUNTER — Other Ambulatory Visit: Payer: Self-pay

## 2022-11-30 DIAGNOSIS — S060X9A Concussion with loss of consciousness of unspecified duration, initial encounter: Secondary | ICD-10-CM | POA: Insufficient documentation

## 2022-11-30 DIAGNOSIS — W19XXXA Unspecified fall, initial encounter: Secondary | ICD-10-CM | POA: Insufficient documentation

## 2022-11-30 DIAGNOSIS — S0990XA Unspecified injury of head, initial encounter: Secondary | ICD-10-CM | POA: Diagnosis present

## 2022-11-30 MED ORDER — KETOROLAC TROMETHAMINE 15 MG/ML IJ SOLN
15.0000 mg | Freq: Once | INTRAMUSCULAR | Status: AC
  Administered 2022-11-30: 15 mg via INTRAMUSCULAR
  Filled 2022-11-30: qty 1

## 2022-11-30 MED ORDER — PROCHLORPERAZINE MALEATE 10 MG PO TABS
10.0000 mg | ORAL_TABLET | Freq: Once | ORAL | Status: AC
  Administered 2022-11-30: 10 mg via ORAL
  Filled 2022-11-30: qty 1

## 2022-11-30 MED ORDER — ACETAMINOPHEN 500 MG PO TABS
1000.0000 mg | ORAL_TABLET | Freq: Once | ORAL | Status: AC
  Administered 2022-11-30: 1000 mg via ORAL
  Filled 2022-11-30: qty 2
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
## 2022-11-30 NOTE — ED Notes (Signed)
Reviewed AVS with patient, patient expressed understanding of directions, denies further questions at this time. 

## 2022-11-30 NOTE — Discharge Instructions (Addendum)
You were seen in the ER today for a fall. Your imaging was reassuring with no signs of bleeding or other injury. You were given toradol and compazine for your headache. You likely have a concussion which can take some time to resolve. I have provided you with a handout to read regarding concussions. Please take time to read this. If symptoms are worsening, return to the ER. Otherwise follow up with your primary care provider.

## 2022-11-30 NOTE — ED Notes (Signed)
Patient using call light, entered patient's room, immediately began yelling at this RN that no one had been in her room and that we were discriminating against her, attempted to calm patient but patient continued yelling, states she believes we are refusing care because she is black, attempted to explain to patient that provider has not been able to see her yet and her results are back, patient did not allow this RN to explain or answer any questions, stepped out of patient's room as RN presence was unproductive. Informed PA Jari Favre that patient is agitated and requested he speak with patient. PA to patient's room for evaluation.

## 2022-11-30 NOTE — ED Notes (Signed)
Patient requesting pain medicine, PA Jari Favre notified.

## 2022-12-03 ENCOUNTER — Encounter: Payer: Self-pay | Admitting: Family Medicine

## 2022-12-03 ENCOUNTER — Ambulatory Visit (INDEPENDENT_AMBULATORY_CARE_PROVIDER_SITE_OTHER): Payer: Medicaid Other | Admitting: Family Medicine

## 2022-12-03 VITALS — HR 81 | Ht 64.0 in | Wt 197.0 lb

## 2022-12-03 DIAGNOSIS — R42 Dizziness and giddiness: Secondary | ICD-10-CM

## 2022-12-03 DIAGNOSIS — S060X0A Concussion without loss of consciousness, initial encounter: Secondary | ICD-10-CM

## 2022-12-03 MED ORDER — NORTRIPTYLINE HCL 25 MG PO CAPS: 25.0000 mg | ORAL_CAPSULE | Freq: Every day | ORAL | 2 refills | Status: AC

## 2022-12-03 MED ORDER — ONDANSETRON 8 MG PO TBDP: 8.0000 mg | ORAL_TABLET | Freq: Three times a day (TID) | ORAL | 3 refills | Status: AC | PRN

## 2022-12-03 MED ORDER — MECLIZINE HCL 25 MG PO TABS: 25.0000 mg | ORAL_TABLET | Freq: Three times a day (TID) | ORAL | 3 refills | Status: AC | PRN

## 2022-12-03 NOTE — Patient Instructions (Signed)
Thank you for coming in today.   Take nortryptline at bedtime.   Use zoftan and or Meclezine for nausea or vomiting or dizzy.   Recheck in 3 weeks.    I've referred you to Physical Therapy.  Let us know if you don't hear from them in one week.   Let me know sooner if this is not working.
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
## 2022-12-16 NOTE — Progress Notes (Deleted)
[name] [address] [place] [address] Phone: [phone]  Assessment and Plan:     There are no diagnoses linked to this encounter.  ***    Date of injury was ***. Symptom severity scores of *** and *** today. Original symptom severity scores were *** and ***. The patient was counseled on the nature of the injury, typical course and potential options for further evaluation and treatment. Discussed the importance of compliance with recommendations. Patient stated understanding of this plan and willingness to comply.  Recommendations:  -  Relative mental and physical rest for 48 hours after concussive event - Recommend light aerobic activity while keeping symptoms less than 3/10 - Stop mental or physical activities that cause symptoms to worsen greater than 3/10, and wait 24 hours before attempting them again - Eliminate screen time as much as possible for first 48 hours after concussive event, then continue limited screen time (recommend less than 2 hours per day)   - Encouraged to RTC in *** for reassessment or sooner for any concerns or acute changes   Pertinent previous records reviewed include ***   Time of visit *** minutes, which included chart review, physical exam, treatment plan, symptom severity score, VOMS, and tandem gait testing being performed, interpreted, and discussed with patient at today's visit.   Subjective:    Chief Complaint: ***  HPI:   12/16/22 ***   Concussion HPI:  - Injury date: ***   - Mechanism of injury: ***  - LOC: ***  - Initial evaluation: ***  - Previous head injuries/concussions: ***   - Previous imaging: ***    - Social history: Student at ***, activities include ***   Hospitalization for head injury? No*** Diagnosed/treated for headache disorder, migraines, or seizures? No*** Diagnosed with learning disability Elnita Maxwell? No*** Diagnosed with ADD/ADHD? No*** Diagnose with Depression,  anxiety, or other Psychiatric Disorder? No***   Current medications:  Current Outpatient Medications  Medication Sig Dispense Refill   meclizine (ANTIVERT) 25 MG tablet Take 1 tablet (25 mg total) by mouth 3 (three) times daily as needed for dizziness or nausea. 30 tablet 3   nortriptyline (PAMELOR) 25 MG capsule Take 1 capsule (25 mg total) by mouth at bedtime. 30 capsule 2   ondansetron (ZOFRAN-ODT) 8 MG disintegrating tablet Take 1 tablet (8 mg total) by mouth every 8 (eight) hours as needed for nausea. 20 tablet 3   No current facility-administered medications for this visit.      Objective:     There were no vitals filed for this visit.    There is no height or weight on file to calculate BMI.    Physical Exam:     General: Well-appearing, cooperative, sitting comfortably in no acute distress.  Psychiatric: Mood and affect are appropriate.   Neuro:sensation intact and strength 5/5 with no deficits, no atrophy, normal muscle tone   Today's Symptom Severity Score:  Scores: 0-6  Headache:*** "Pressure in head":***  Neck Pain:*** Nausea or vomiting:*** Dizziness:*** Blurred vision:*** Balance problems:*** Sensitivity to light:*** Sensitivity to noise:*** Feeling slowed down:*** Feeling like "in a fog":*** "Don't feel right":*** Difficulty concentrating:*** Difficulty remembering:***  Fatigue or low energy:*** Confusion:***  Drowsiness:***  More emotional:*** Irritability:*** Sadness:***  Nervous or Anxious:*** Trouble falling or staying asleep:***  Total number of symptoms: ***/22  Symptom Severity index: ***/132  Worse with physical activity? No*** Worse with mental activity? No*** Percent improved since injury: ***%    Full pain-free cervical  PROM: yes***    Cognitive:  - Months backwards: *** Mistakes. *** seconds  mVOMS:   - Baseline symptoms: *** - Horizontal Vestibular-Ocular Reflex: ***/10  - Smooth pursuits: ***/10  - Horizontal Saccades:   ***/10  - Visual Motion Sensitivity Test:  ***/10  - Convergence: ***cm (<5 cm normal)    Autonomic:  - Symptomatic with supine to standing: No***  Complex Tandem Gait: - Forward, eyes open: *** errors - Backward, eyes open: *** errors - Forward, eyes closed: *** errors - Backward, eyes closed: *** errors  Electronically signed by:  [name] 3:28 PM 12/16/22

## 2022-12-17 ENCOUNTER — Encounter: Payer: Medicaid Other | Admitting: Sports Medicine
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
## 2022-12-21 ENCOUNTER — Encounter: Payer: Medicaid Other | Admitting: Family Medicine

## 2023-01-04 ENCOUNTER — Encounter: Payer: Medicaid Other | Admitting: Sports Medicine

## 2023-04-16 ENCOUNTER — Telehealth: Payer: Medicaid Other | Admitting: Physician Assistant

## 2023-04-16 DIAGNOSIS — N946 Dysmenorrhea, unspecified: Secondary | ICD-10-CM | POA: Diagnosis not present

## 2023-04-16 MED ORDER — BACLOFEN 10 MG PO TABS: 10.0000 mg | ORAL_TABLET | Freq: Three times a day (TID) | ORAL | 0 refills | Status: AC

## 2023-04-16 MED ORDER — NAPROXEN 500 MG PO TABS: 500.0000 mg | ORAL_TABLET | Freq: Two times a day (BID) | ORAL | 0 refills | Status: AC

## 2023-04-16 NOTE — Progress Notes (Signed)
 E-Visit for Back Pain   We are sorry that you are not feeling well.  Here is how we plan to help!  Based on what you have shared with me it looks like you mostly have acute back pain.  Acute back pain is defined as musculoskeletal pain that can resolve in 1-3 weeks with conservative treatment.  I have prescribed Naprosyn  500 mg take one by mouth twice a day non-steroid anti-inflammatory (NSAID) as well as Baclofen  10 mg every eight hours as needed which is a muscle relaxer  Some patients experience stomach irritation or in increased heartburn with anti-inflammatory drugs.  Please keep in mind that muscle relaxer's can cause fatigue and should not be taken while at work or driving.  Back pain is very common.  The pain often gets better over time.  The cause of back pain is usually not dangerous.  Most people can learn to manage their back pain on their own.  Home Care Stay active.  Start with short walks on flat ground if you can.  Try to walk farther each day. Do not sit, drive or stand in one place for more than 30 minutes.  Do not stay in bed. Do not avoid exercise or work.  Activity can help your back heal faster. Be careful when you bend or lift an object.  Bend at your knees, keep the object close to you, and do not twist. Sleep on a firm mattress.  Lie on your side, and bend your knees.  If you lie on your back, put a pillow under your knees. Only take medicines as told by your doctor. Put ice on the injured area. Put ice in a plastic bag Place a towel between your skin and the bag Leave the ice on for 15-20 minutes, 3-4 times a day for the first 2-3 days. 210 After that, you can switch between ice and heat packs. Ask your doctor about back exercises or massage. Avoid feeling anxious or stressed.  Find good ways to deal with stress, such as exercise.  Get Help Right Way If: Your pain does not go away with rest or medicine. Your pain does not go away in 1 week. You have new  problems. You do not feel well. The pain spreads into your legs. You cannot control when you poop (bowel movement) or pee (urinate) You feel sick to your stomach (nauseous) or throw up (vomit) You have belly (abdominal) pain. You feel like you may pass out (faint). If you develop a fever.  Make Sure you: Understand these instructions. Will watch your condition Will get help right away if you are not doing well or get worse.  Your e-visit answers were reviewed by a board certified advanced clinical practitioner to complete your personal care plan.  Depending on the condition, your plan could have included both over the counter or prescription medications.  If there is a problem please reply  once you have received a response from your provider.  Your safety is important to us .  If you have drug allergies check your prescription carefully.    You can use MyChart to ask questions about today's visit, request a non-urgent call back, or ask for a work or school excuse for 24 hours related to this e-Visit. If it has been greater than 24 hours you will need to follow up with your provider, or enter a new e-Visit to address those concerns.  You will get an e-mail in the next two days asking about  your experience.  I hope that your e-visit has been valuable and will speed your recovery. Thank you for using e-visits.    I have spent 5 minutes in review of e-visit questionnaire, review and updating patient chart, medical decision making and response to patient.   Angelia Kelp, PA-C

## 2023-06-14 ENCOUNTER — Encounter: Payer: Self-pay | Admitting: Family Medicine

## 2023-06-14 ENCOUNTER — Ambulatory Visit: Payer: Medicaid Other | Admitting: Family Medicine

## 2023-06-14 ENCOUNTER — Other Ambulatory Visit (HOSPITAL_COMMUNITY)
Admission: RE | Admit: 2023-06-14 | Discharge: 2023-06-14 | Disposition: A | Discharge status: Home / Self Care | Source: Ambulatory Visit | Attending: Family Medicine | Admitting: Family Medicine

## 2023-06-14 ENCOUNTER — Other Ambulatory Visit: Payer: Self-pay

## 2023-06-14 VITALS — BP 125/72 | HR 73 | Wt 203.9 lb

## 2023-06-14 DIAGNOSIS — N898 Other specified noninflammatory disorders of vagina: Secondary | ICD-10-CM | POA: Diagnosis present

## 2023-06-14 DIAGNOSIS — Z1331 Encounter for screening for depression: Secondary | ICD-10-CM

## 2023-06-14 DIAGNOSIS — R102 Pelvic and perineal pain: Secondary | ICD-10-CM

## 2023-06-14 DIAGNOSIS — N939 Abnormal uterine and vaginal bleeding, unspecified: Secondary | ICD-10-CM | POA: Diagnosis not present

## 2023-06-14 MED ORDER — MELOXICAM 15 MG PO TABS: 15.0000 mg | ORAL_TABLET | Freq: Every day | ORAL | 2 refills | Status: AC

## 2023-06-14 NOTE — Assessment & Plan Note (Addendum)
 Likely related to Nexplanon. She does not want this removed. Had allergic reaction to Doxy. Trial of NSAIDS. Check labs

## 2023-06-14 NOTE — Progress Notes (Signed)
   Subjective:    Patient ID: Ana [name] is a 33 y.o. female presenting with Gynecologic Exam  on 06/14/2023  HPI: Having worse pain with her cycles. Has h/o PID. Had a trial of doxy and notes she broke out from that. Has her Nexplanon in place and at first noted no cycle and now her cycle is heavier again. Notes she is having recurrent BV. Has some vaginal irritation.   Review of Systems  Constitutional:  Negative for chills and fever.  Respiratory:  Negative for shortness of breath.   Cardiovascular:  Negative for chest pain.  Gastrointestinal:  Negative for abdominal pain, nausea and vomiting.  Genitourinary:  Negative for dysuria.  Skin:  Negative for rash.      Objective:    BP 125/72 (Patient Position: Sitting)   Pulse 73   Wt 203 lb 14.4 oz (92.5 kg)   LMP 05/29/2023 (Exact Date) Comment: irregular, heavy bleeding, intense cramps  BMI 35.00 kg/m  Physical Exam Exam conducted with a chaperone present.  Constitutional:      General: She is not in acute distress.    Appearance: She is well-developed.  HENT:     Head: Normocephalic and atraumatic.  Eyes:     General: No scleral icterus. Cardiovascular:     Rate and Rhythm: Normal rate.  Pulmonary:     Effort: Pulmonary effort is normal.  Abdominal:     Palpations: Abdomen is soft.  Musculoskeletal:     Cervical back: Neck supple.  Skin:    General: Skin is warm and dry.  Neurological:     Mental Status: She is alert and oriented to person, place, and time.         Assessment & Plan:   Problem List Items Addressed This Visit       Unprioritized   Pelvic pain   Check u/s       Relevant Medications   meloxicam (MOBIC) 15 MG tablet   Other Relevant Orders   US PELVIC COMPLETE WITH TRANSVAGINAL   Abnormal uterine bleeding   Likely related to Nexplanon. She does not want this removed. Had allergic reaction to Doxy. Trial of NSAIDS. Check labs      Relevant Orders   TSH   CBC   Other Visit  Diagnoses       Vaginal irritation    -  Primary   check wet prep and treat appropriately if needed. Boric acid suppositories for prevention.   Relevant Orders   Cervicovaginal ancillary only       Return in about 4 weeks (around 07/12/2023).  Reva Bores, MD 06/14/2023 10:05 AM

## 2023-06-14 NOTE — Assessment & Plan Note (Signed)
 Check u/s

## 2023-06-15 ENCOUNTER — Encounter: Payer: Self-pay | Admitting: Family Medicine

## 2023-06-15 LAB — CBC
Hematocrit: 40.7 % (ref 34.0–46.6)
Hemoglobin: 12.5 g/dL (ref 11.1–15.9)
MCH: 27.8 pg (ref 26.6–33.0)
MCHC: 30.7 g/dL — ABNORMAL LOW (ref 31.5–35.7)
MCV: 91 fL (ref 79–97)
Platelets: 256 10*3/uL (ref 150–450)
RBC: 4.49 x10E6/uL (ref 3.77–5.28)
RDW: 12.4 % (ref 11.7–15.4)
WBC: 8.7 10*3/uL (ref 3.4–10.8)

## 2023-06-15 LAB — TSH: TSH: 0.571 u[IU]/mL (ref 0.450–4.500)

## 2023-06-16 LAB — CERVICOVAGINAL ANCILLARY ONLY
Bacterial Vaginitis (gardnerella): NEGATIVE
Candida Glabrata: NEGATIVE
Candida Vaginitis: NEGATIVE
Chlamydia: NEGATIVE
Comment: NEGATIVE
Comment: NEGATIVE
Comment: NEGATIVE
Comment: NEGATIVE
Comment: NEGATIVE
Comment: NORMAL
Neisseria Gonorrhea: NEGATIVE
Trichomonas: NEGATIVE

## 2023-06-18 ENCOUNTER — Encounter: Payer: Self-pay | Admitting: Family Medicine

## 2023-06-18 ENCOUNTER — Ambulatory Visit (HOSPITAL_COMMUNITY)
Admission: RE | Admit: 2023-06-18 | Discharge: 2023-06-18 | Disposition: A | Discharge status: Home / Self Care | Source: Ambulatory Visit | Attending: Family Medicine | Admitting: Family Medicine

## 2023-06-18 DIAGNOSIS — R102 Pelvic and perineal pain: Secondary | ICD-10-CM | POA: Insufficient documentation

## 2023-11-22 ENCOUNTER — Encounter: Payer: Self-pay | Admitting: Family Medicine

## 2023-11-22 ENCOUNTER — Ambulatory Visit: Admitting: Family Medicine

## 2023-11-22 NOTE — Progress Notes (Signed)
 Patient did not keep appointment today. She may call to reschedule.

## 2024-04-03 ENCOUNTER — Ambulatory Visit: Admitting: Family Medicine

## 2024-05-17 ENCOUNTER — Ambulatory Visit: Admitting: Family Medicine
